# Patient Record
Sex: Male | Born: 1935 | Race: White | Hispanic: No | Marital: Married | Smoking: Former smoker
Health system: Southern US, Community
[De-identification: ages and names within clinical notes are randomized; demographics above are authoritative.]

## PROBLEM LIST (undated history)

## (undated) DIAGNOSIS — K5790 Diverticulosis of intestine, part unspecified, without perforation or abscess without bleeding: Secondary | ICD-10-CM

## (undated) DIAGNOSIS — I482 Chronic atrial fibrillation, unspecified: Secondary | ICD-10-CM

## (undated) DIAGNOSIS — I6529 Occlusion and stenosis of unspecified carotid artery: Secondary | ICD-10-CM

## (undated) DIAGNOSIS — I4821 Permanent atrial fibrillation: Secondary | ICD-10-CM

## (undated) DIAGNOSIS — D369 Benign neoplasm, unspecified site: Secondary | ICD-10-CM

## (undated) DIAGNOSIS — N200 Calculus of kidney: Secondary | ICD-10-CM

## (undated) DIAGNOSIS — I714 Abdominal aortic aneurysm, without rupture, unspecified: Secondary | ICD-10-CM

## (undated) DIAGNOSIS — E7800 Pure hypercholesterolemia, unspecified: Secondary | ICD-10-CM

## (undated) DIAGNOSIS — R7303 Prediabetes: Secondary | ICD-10-CM

## (undated) DIAGNOSIS — G4730 Sleep apnea, unspecified: Secondary | ICD-10-CM

## (undated) DIAGNOSIS — K2100 Gastro-esophageal reflux disease with esophagitis, without bleeding: Secondary | ICD-10-CM

## (undated) DIAGNOSIS — J449 Chronic obstructive pulmonary disease, unspecified: Secondary | ICD-10-CM

## (undated) DIAGNOSIS — K449 Diaphragmatic hernia without obstruction or gangrene: Secondary | ICD-10-CM

## (undated) DIAGNOSIS — K7689 Other specified diseases of liver: Secondary | ICD-10-CM

## (undated) DIAGNOSIS — I10 Essential (primary) hypertension: Secondary | ICD-10-CM

## (undated) DIAGNOSIS — K649 Unspecified hemorrhoids: Secondary | ICD-10-CM

## (undated) DIAGNOSIS — N281 Cyst of kidney, acquired: Secondary | ICD-10-CM

## (undated) DIAGNOSIS — N400 Enlarged prostate without lower urinary tract symptoms: Secondary | ICD-10-CM

## (undated) HISTORY — DX: Prediabetes: R73.03

## (undated) HISTORY — DX: Diverticulosis of intestine, part unspecified, without perforation or abscess without bleeding: K57.90

## (undated) HISTORY — DX: Pure hypercholesterolemia, unspecified: E78.00

## (undated) HISTORY — DX: Permanent atrial fibrillation: I48.21

## (undated) HISTORY — DX: Sleep apnea, unspecified: G47.30

## (undated) HISTORY — PX: KIDNEY STONE SURGERY: SHX686

## (undated) HISTORY — PX: NASAL ENDOSCOPY: SHX286

## (undated) HISTORY — DX: Calculus of kidney: N20.0

## (undated) HISTORY — DX: Unspecified hemorrhoids: K64.9

## (undated) HISTORY — PX: HERNIA REPAIR: SHX51

## (undated) HISTORY — PX: COLONOSCOPY: SHX174

## (undated) HISTORY — DX: Essential (primary) hypertension: I10

## (undated) HISTORY — DX: Benign prostatic hyperplasia without lower urinary tract symptoms: N40.0

## (undated) HISTORY — DX: Benign neoplasm, unspecified site: D36.9

## (undated) HISTORY — DX: Occlusion and stenosis of unspecified carotid artery: I65.29

## (undated) HISTORY — DX: Cyst of kidney, acquired: N28.1

## (undated) HISTORY — DX: Diaphragmatic hernia without obstruction or gangrene: K44.9

## (undated) HISTORY — PX: KNEE ARTHROSCOPY: SUR90

## (undated) HISTORY — DX: Other specified diseases of liver: K76.89

## (undated) HISTORY — DX: Chronic atrial fibrillation, unspecified: I48.20

---

## 2008-08-02 ENCOUNTER — Encounter: Payer: Self-pay | Admitting: Orthopedic Surgery

## 2008-12-08 ENCOUNTER — Ambulatory Visit (HOSPITAL_COMMUNITY): Admission: RE | Admit: 2008-12-08 | Discharge: 2008-12-08 | Payer: Self-pay | Admitting: Pulmonary Disease

## 2008-12-28 ENCOUNTER — Ambulatory Visit (HOSPITAL_COMMUNITY): Admission: RE | Admit: 2008-12-28 | Discharge: 2008-12-28 | Payer: Self-pay | Admitting: Pulmonary Disease

## 2009-03-29 ENCOUNTER — Encounter: Payer: Self-pay | Admitting: Orthopedic Surgery

## 2009-03-30 ENCOUNTER — Ambulatory Visit: Payer: Self-pay | Admitting: Orthopedic Surgery

## 2009-03-30 DIAGNOSIS — M1710 Unilateral primary osteoarthritis, unspecified knee: Secondary | ICD-10-CM

## 2010-02-07 ENCOUNTER — Ambulatory Visit (HOSPITAL_COMMUNITY)
Admission: RE | Admit: 2010-02-07 | Discharge: 2010-02-07 | Payer: Self-pay | Source: Home / Self Care | Attending: Pulmonary Disease | Admitting: Pulmonary Disease

## 2010-02-21 NOTE — Letter (Signed)
Summary: Historic Patient File  Historic Patient File   Imported By: Elvera Maria 04/01/2009 09:12:30  _____________________________________________________________________  External Attachment:    Type:   Image     Comment:   history form

## 2010-02-21 NOTE — Letter (Signed)
Summary: *Orthopedic Consult Note  [facility]  [address] [place] [address]  [place], [place] [address]   Phone: [phone]  Fax: [phone]    Re:    CLOY COZZENS DOB:    1930/11/20   Dear: [name]   Thank you for requesting that we see the above patient for consultation.  A copy of the detailed office note will be sent under separate cover, for your review.  Evaluation today is consistent with:  1)  KNEE, ARTHRITIS, DEGEN./OSTEO (ICD-715.96)   Our recommendation is for: continued observation at this time.  He will have to have a knee replacement at some point.  He is on a protocol as you know at [facility] regarding his Coumadin non-covertable atrial fibrillation.  His cardiologist is in [place].  If he does need to have surgery I would like his cardiologist to be in refill and perhaps transferred his care here.  If not then a preoperative cardiology evaluation would be most helpful.  We may even need a preop anesthesia consult to make sure they are comfortable from an anesthetic standpoint       Thank you for this opportunity to look after your patient.  Sincerely,   Terrance Mass. MD.

## 2010-02-21 NOTE — Miscellaneous (Signed)
Summary: ROS for OV 03/30/08   Clinical Lists Changes   Fatigue, headache, ears ringing, shortness of breath, wheezimg, snoring, constipation, frequency, urgency, blood in urine, joint pain, joint swelling, instability, stiffness, muscle pain, unsteady gait, easy bleeding, bruising, seasonal allergies.  History Irregular heart rhythm, Pulmonary problems, Enlarged Prostate,   Surgeries Kidney stone, 2 hernias, 2 left knee arthroscopies  Family History heart disease, arthritis, cancer.  Married, retired no smoking or alcohol 2 cups of coffee per day.  Meds: Klor Con, Amlodipine, Benazepril, Benicar, ASA, MV, Metoprolol,Coumadin, Crestor, Tamsulosin, Oxybutyn er.

## 2010-02-21 NOTE — Assessment & Plan Note (Signed)
Summary: RT KNEE PAIN NEDS XR/MEDICARE/ST BCBS/HAWKINS/BSF   Vital Signs:  Patient profile:   77 year old male Height:      68 inches Weight:      252 pounds Temp:     98.7 degrees F  Visit Type:  consult  CC:  right knee pain.  History of Present Illness: consult has been requested by [name]  I have a 78 her home and presents with RIGHT knee pain he was previously seen and due up in July and eventually had 5 shots of hyaluronic acid did well started having pain again but recently has noted a decrease in pain.  He does have pain is sharp burning comes and goes it's rated a 5/10 is worse with exercise and relieved by heat.  He does note some tingling and swelling in the knee  Diagnosis is chondromalacia patella along with arthritis    Physical Exam  Additional Exam:  GEN: well developed, well nourished, normal grooming and hygiene, no deformity and medium large body habitus.   CDV: pulses are normal, no edema, no erythema. no tenderness, however he had significant skin changes which suggest venous stasis disease with darkening of the skin near the ankles and shin bone.  Lymph: normal lymph nodes   Skin: no rashes, skin lesions or open sores   NEURO: normal coordination, reflexes, sensation.   Psyche: awake, alert and oriented. Mood normal   Gait: fairly normal  RIGHT knee flexion contracture 5-10, flexion 130  Medial joint line tenderness is noted.  Crepitance is noted on range of motion.  Motor exam is normal.  Joint is stable.       Impression & Recommendations:  Problem # 1:  KNEE, ARTHRITIS, DEGEN./OSTEO (ICD-715.96) Assessment New the x-rays were ordered and they show that he has a moderate amount of arthritis on x-ray  There is medial joint space narrowing, osteophytes, bone looks of good quality  Impression moderate osteoarthritis RIGHT knee   Assessment this patient is on Coumadin and is on a Coumadin protocol at [facility].  My most pressing concern  is that he could not be cardioverted and if he had any surgery I would like for him to transfer his cardiologist from [place] to [place].  However at present he is not having a lot of pain is not ready for knee surgery so we can wait until he becomes more symptomatic.  If he decides to have surgery her preoperative evaluation would be helpful and a transfer of care from [place] to [place] would be very helpful from a cardiology standpoint  Orders: Consultation Level III [phone])

## 2010-02-21 NOTE — Letter (Signed)
Summary: External Other  External Other   Imported By: Elvera Maria 04/01/2009 09:13:07  _____________________________________________________________________  External Attachment:    Type:   Image     Comment:   [facility]

## 2011-10-04 ENCOUNTER — Emergency Department (HOSPITAL_COMMUNITY): Payer: Medicare Other

## 2011-10-04 ENCOUNTER — Emergency Department (HOSPITAL_COMMUNITY)
Admission: EM | Admit: 2011-10-04 | Discharge: 2011-10-05 | Disposition: A | Discharge status: Home / Self Care | Payer: Medicare Other | Attending: Emergency Medicine | Admitting: Emergency Medicine

## 2011-10-04 ENCOUNTER — Encounter (HOSPITAL_COMMUNITY): Payer: Self-pay

## 2011-10-04 DIAGNOSIS — Z87891 Personal history of nicotine dependence: Secondary | ICD-10-CM | POA: Insufficient documentation

## 2011-10-04 DIAGNOSIS — J4489 Other specified chronic obstructive pulmonary disease: Secondary | ICD-10-CM | POA: Insufficient documentation

## 2011-10-04 DIAGNOSIS — J449 Chronic obstructive pulmonary disease, unspecified: Secondary | ICD-10-CM | POA: Insufficient documentation

## 2011-10-04 DIAGNOSIS — I10 Essential (primary) hypertension: Secondary | ICD-10-CM | POA: Insufficient documentation

## 2011-10-04 DIAGNOSIS — M549 Dorsalgia, unspecified: Secondary | ICD-10-CM | POA: Insufficient documentation

## 2011-10-04 DIAGNOSIS — R109 Unspecified abdominal pain: Secondary | ICD-10-CM | POA: Insufficient documentation

## 2011-10-04 DIAGNOSIS — G8929 Other chronic pain: Secondary | ICD-10-CM | POA: Insufficient documentation

## 2011-10-04 HISTORY — DX: Abdominal aortic aneurysm, without rupture: I71.4

## 2011-10-04 HISTORY — DX: Chronic obstructive pulmonary disease, unspecified: J44.9

## 2011-10-04 HISTORY — DX: Abdominal aortic aneurysm, without rupture, unspecified: I71.40

## 2011-10-04 LAB — COMPREHENSIVE METABOLIC PANEL
Albumin: 4.1 g/dL (ref 3.5–5.2)
Alkaline Phosphatase: 59 U/L (ref 39–117)
BUN: 22 mg/dL (ref 6–23)
Calcium: 9.9 mg/dL (ref 8.4–10.5)
Potassium: 3.5 mEq/L (ref 3.5–5.1)
Total Protein: 7.7 g/dL (ref 6.0–8.3)

## 2011-10-04 LAB — URINALYSIS, ROUTINE W REFLEX MICROSCOPIC
Glucose, UA: NEGATIVE mg/dL
Ketones, ur: NEGATIVE mg/dL
Leukocytes, UA: NEGATIVE
Nitrite: NEGATIVE
Protein, ur: NEGATIVE mg/dL

## 2011-10-04 LAB — CBC WITH DIFFERENTIAL/PLATELET
Basophils Absolute: 0 10*3/uL (ref 0.0–0.1)
Basophils Relative: 0 % (ref 0–1)
MCHC: 33.8 g/dL (ref 30.0–36.0)
Neutro Abs: 5.8 10*3/uL (ref 1.7–7.7)
Neutrophils Relative %: 67 % (ref 43–77)
RDW: 13.3 % (ref 11.5–15.5)

## 2011-10-04 LAB — LACTIC ACID, PLASMA: Lactic Acid, Venous: 1.1 mmol/L (ref 0.5–2.2)

## 2011-10-04 LAB — APTT: aPTT: 37 seconds (ref 24–37)

## 2011-10-04 LAB — HEPATIC FUNCTION PANEL
AST: 25 U/L (ref 0–37)
Alkaline Phosphatase: 59 U/L (ref 39–117)
Bilirubin, Direct: 0.1 mg/dL (ref 0.0–0.3)
Total Bilirubin: 0.9 mg/dL (ref 0.3–1.2)

## 2011-10-04 LAB — PROTIME-INR: Prothrombin Time: 22.1 seconds — ABNORMAL HIGH (ref 11.6–15.2)

## 2011-10-04 LAB — URINE MICROSCOPIC-ADD ON

## 2011-10-04 MED ORDER — HYDROCODONE-ACETAMINOPHEN 5-325 MG PO TABS: 2.0000 | ORAL_TABLET | Freq: Four times a day (QID) | ORAL | Status: DC | PRN

## 2011-10-04 MED ORDER — HYDROMORPHONE HCL PF 1 MG/ML IJ SOLN
1.0000 mg | Freq: Once | INTRAMUSCULAR | Status: AC
  Administered 2011-10-04: 1 mg via INTRAVENOUS
  Filled 2011-10-04: qty 1

## 2011-10-04 MED ORDER — IOHEXOL 300 MG/ML  SOLN
100.0000 mL | Freq: Once | INTRAMUSCULAR | Status: AC | PRN
  Administered 2011-10-04: 100 mL via INTRAVENOUS

## 2011-10-04 MED ORDER — PANTOPRAZOLE SODIUM 20 MG PO TBEC: 20.0000 mg | DELAYED_RELEASE_TABLET | Freq: Every day | ORAL | Status: DC

## 2011-10-04 MED ORDER — HYDROCODONE-ACETAMINOPHEN 5-325 MG PO TABS
2.0000 | ORAL_TABLET | Freq: Once | ORAL | Status: AC
  Administered 2011-10-05: 2 via ORAL
  Filled 2011-10-04: qty 2

## 2011-10-04 MED ORDER — ALUM & MAG HYDROXIDE-SIMETH 200-200-20 MG/5ML PO SUSP
30.0000 mL | Freq: Once | ORAL | Status: AC
  Administered 2011-10-05: 30 mL via ORAL
  Filled 2011-10-04: qty 30

## 2011-10-04 NOTE — ED Notes (Addendum)
Pt states increased pain 10/10. Dr Rhunette Croft notified and reordered 1mg  dilauded. Will administer and reasess in 30 min

## 2011-10-04 NOTE — ED Provider Notes (Signed)
History     CSN: [id number]  Arrival date & time 10/04/11  1952   First MD Initiated Contact with Patient 10/04/11 2015      Chief Complaint  Patient presents with  . Abdominal Pain  . Back Pain    (Consider location/radiation/quality/duration/timing/severity/associated sxs/prior treatment) HPI Comments: Pt with hx of HTN, AAA comes in with cc of abd pain and back pain. Pt reports that he started having some abd pain this morning - and it is described as diffuse abd pain, difficult to characterize, with may be some radiation to the back. There is no associated nausea, emesis, diarrhea. No chest pain, sob. Pt has no specific aggravating or relieving factors, and there is hx of similar pain in the past. Pt has some loss of appetite. No weakness to the legs, no numbness, tingling.   Patient is a 78 y.o. male presenting with abdominal pain and back pain. The history is provided by the patient.  Abdominal Pain The primary symptoms of the illness include abdominal pain. The primary symptoms of the illness do not include fever, shortness of breath, nausea, vomiting, diarrhea or dysuria.  Additional symptoms associated with the illness include back pain. Symptoms associated with the illness do not include chills or constipation.  Back Pain  Associated symptoms include abdominal pain. Pertinent negatives include no chest pain, no fever, no headaches and no dysuria.    Past Medical History  Diagnosis Date  . Atrial fibrillation, controlled   . COPD (chronic obstructive pulmonary disease)   . Hernia   . Abdominal aortic aneurysm without rupture   . Kidney stones     Past Surgical History  Procedure Date  . Hernia repair   . Lithotripsy     History reviewed. No pertinent family history.  History  Substance Use Topics  . Smoking status: Former Games developer  . Smokeless tobacco: Not on file  . Alcohol Use: No      Review of Systems  Constitutional: Negative for fever, chills and  activity change.  HENT: Negative for neck pain.   Eyes: Negative for visual disturbance.  Respiratory: Negative for cough, chest tightness and shortness of breath.   Cardiovascular: Negative for chest pain.  Gastrointestinal: Positive for abdominal pain. Negative for nausea, vomiting, diarrhea, constipation, blood in stool, abdominal distention and rectal pain.  Genitourinary: Negative for dysuria, enuresis and difficulty urinating.  Musculoskeletal: Positive for back pain. Negative for arthralgias.  Neurological: Positive for dizziness. Negative for light-headedness and headaches.  Psychiatric/Behavioral: Negative for confusion.    Allergies  Latex  Home Medications  No current outpatient prescriptions on file.  BP 188/93  Pulse 81  Temp 98.4 F (36.9 C) (Oral)  Resp 24  Ht 5\' 8"  (1.727 m)  Wt 250 lb (113.399 kg)  BMI 38.01 kg/m2  SpO2 100%  Physical Exam  Nursing note and vitals reviewed. Constitutional: He is oriented to person, place, and time. He appears well-developed.  HENT:  Head: Normocephalic and atraumatic.  Eyes: Conjunctivae normal and EOM are normal. Pupils are equal, round, and reactive to light.  Neck: Normal range of motion. Neck supple.  Cardiovascular: Normal rate and regular rhythm.   Pulmonary/Chest: Effort normal and breath sounds normal.  Abdominal: Soft. Bowel sounds are normal. He exhibits distension. There is tenderness. There is no rebound and no guarding.       abd is distended, with what appears tobe a reducible ventral hernia. Diffuse tenderness, no CVA tenderness, no rebound or guarding.  Neurological: He is  alert and oriented to person, place, and time.  Skin: Skin is warm.    ED Course  Procedures (including critical care time)  Labs Reviewed  CBC WITH DIFFERENTIAL - Abnormal; Notable for the following:    Monocytes Relative 13 (*)     Monocytes Absolute 1.1 (*)     All other components within normal limits  TROPONIN I  APTT    LIPASE, BLOOD  HEPATIC FUNCTION PANEL  URINALYSIS, ROUTINE W REFLEX MICROSCOPIC  COMPREHENSIVE METABOLIC PANEL   Dg Abd Acute W/chest  10/04/2011  *RADIOLOGY REPORT*  Clinical Data: Abdominal pain and back pain.  ACUTE ABDOMEN SERIES (ABDOMEN 2 VIEW & CHEST 1 VIEW)  Comparison: CT abdomen and pelvis 12/28/2008.  Findings: Shallow inspiration.  Mild cardiac enlargement with normal pulmonary vascularity.  No focal airspace consolidation in the lungs.  No blunting of costophrenic angles.  Slight fibrosis in the left base.  Calcification of the aorta.  No pneumothorax.  Small large bowel are diffusely gas-filled with mild dilatation. Changes most consistent with adynamic ileus.  No free intra- abdominal air.  No abnormal air fluid levels.  Calcifications in the pelvis consistent with phleboliths.  Degenerative changes in the spine and hips.  Postsurgical changes in the left inguinal region.  IMPRESSION: No evidence of active pulmonary disease.  Mild diffuse gaseous prominence of small and large bowel most suggestive of ileus.   Original Report Authenticated By: Marlon Pel, M.D.      No diagnosis found.    MDM   Date: 10/04/2011  Rate: 89  Rhythm: atrial fibrillation  QRS Axis: normal  Intervals: normal  ST/T Wave abnormalities: nonspecific ST/T changes  Conduction Disutrbances:none  Narrative Interpretation:   Old EKG Reviewed: none available  DDx includes: Pancreatitis Hepatobiliary pathology including cholecystitis Gastritis/PUD SBO ACS syndrome Aortic Dissection Colitis AAA Tumors Colitis Intra abdominal abscess Thrombosis Mesenteric ischemia Diverticulitis Peritonitis Appendicitis Hernia Nephrolithiasis Pyelonephritis UTI/Cystitis   Pt comes in with cc of abd pain - and with hx of afib, and aaa, HTN, smoking - the ddx is vast as above.  We will get GI screening labs, start with AAS. Bedside US didn't get a good picture of the aorta - FAST is  negative.  Plan is to get AAS - and based on the findings decided to get CT with po contrast vs. IV only. Will get ACS labs as well. Lactate ordered to see if there is any evidence of ischemia as he has afib CT will also look for any hematoma.  11:51 PM All the labs are WNL. CT scan shows some liver cysts with possible bleeding within them. Hb is fine, no elevated WC, INR is normal, lactate is fine. Pt's pain is much better - family is at bedside.  We gave the findings to them, and have requested a f/u with PCP or GI soon. We will give GI contact info. Advised to return to the ED immediately if there are any concerns.                  Derwood Kaplan, MD 10/04/11 [phone]

## 2011-10-04 NOTE — ED Notes (Signed)
Abdominal pain and back pain that started this morning per pt. Denies nausea, vomiting, shortness of breath.

## 2011-10-05 NOTE — ED Notes (Signed)
Waiting for 2nd troponin EDP stated if negative pt to be discharged

## 2011-10-08 ENCOUNTER — Ambulatory Visit (INDEPENDENT_AMBULATORY_CARE_PROVIDER_SITE_OTHER): Payer: Medicare Other | Admitting: Gastroenterology

## 2011-10-08 ENCOUNTER — Encounter: Payer: Self-pay | Admitting: Gastroenterology

## 2011-10-08 VITALS — BP 114/64 | HR 70 | Temp 98.2°F | Ht 68.0 in | Wt 245.4 lb

## 2011-10-08 DIAGNOSIS — K219 Gastro-esophageal reflux disease without esophagitis: Secondary | ICD-10-CM

## 2011-10-08 DIAGNOSIS — R1010 Upper abdominal pain, unspecified: Secondary | ICD-10-CM

## 2011-10-08 DIAGNOSIS — R498 Other voice and resonance disorders: Secondary | ICD-10-CM

## 2011-10-08 DIAGNOSIS — K5900 Constipation, unspecified: Secondary | ICD-10-CM

## 2011-10-08 DIAGNOSIS — R142 Eructation: Secondary | ICD-10-CM | POA: Insufficient documentation

## 2011-10-08 DIAGNOSIS — Z79899 Other long term (current) drug therapy: Secondary | ICD-10-CM

## 2011-10-08 DIAGNOSIS — K7689 Other specified diseases of liver: Secondary | ICD-10-CM

## 2011-10-08 DIAGNOSIS — R109 Unspecified abdominal pain: Secondary | ICD-10-CM

## 2011-10-08 DIAGNOSIS — R499 Unspecified voice and resonance disorder: Secondary | ICD-10-CM | POA: Insufficient documentation

## 2011-10-08 DIAGNOSIS — R141 Gas pain: Secondary | ICD-10-CM

## 2011-10-08 MED ORDER — POLYETHYLENE GLYCOL 3350 17 GM/SCOOP PO POWD: 17.0000 g | Freq: Every day | ORAL | Status: AC | PRN

## 2011-10-08 NOTE — Progress Notes (Signed)
Primary Care Physician:  Fredirick Maudlin, MD  Primary Gastroenterologist:  Roetta Sessions, MD   Chief Complaint  Patient presents with  . Abdominal Pain    HPI:  Tristan Lopez is a 78 y.o. male here accompanied by his wife and daughter-in-law for further evaluation of recent abdominal pain and need for endoscopy and colonoscopy.  Thursday night mid abdominal pain, swelling. Progressed over 3-4 hour period time and became severe.Thursday morning lot of burping. Ate fish at 2pm. BM green, loose. Took Kaopectate. Had also taken milk of magnesia 2 doses earlier in the day. Severe pain and into back. RUQ tenderness with palpation. Pain went away after Dilaudid in the emergency room. He had blood work and CT, abdominal series as outlined below. No further abdominal pain.  Friday, he fell when trying to get off the toilet. States his legs have gone to sleep for sitting there too long. [name]. Some residual soreness of right lower ribs, minimal bruising.   Chronically he denies heartburn but has lots of belching. Worse over the last several months. No dysphagia. No n/v. Throat clearing/hoarseness for couple of years. BM, chronic constipation. Better with eating "protein bar". No blood in the stool. No black stool. [name] in [place], manages coumadin, for A. Fib. No prior history of stroke or blood clots.  History of large hepatic cysts, no prior difficulties or evaluation for this.  Current Outpatient Prescriptions  Medication Sig Dispense Refill  . amLODipine (NORVASC) 5 MG tablet Take 5 mg by mouth daily.      Marland Kitchen aspirin EC 81 MG tablet Take 81 mg by mouth daily.      . benazepril (LOTENSIN) 40 MG tablet Take 40 mg by mouth daily.      Marland Kitchen docusate sodium (COLACE) 100 MG capsule Take 100 mg by mouth daily.      . finasteride (PROSCAR) 5 MG tablet Take 5 mg by mouth daily.      . Garlic 1000 MG CAPS Take 1,000 mg by mouth daily.      Marland Kitchen  losartan-hydrochlorothiazide (HYZAAR) 100-25 MG per tablet Take 1 tablet by mouth daily.      . metoprolol (LOPRESSOR) 100 MG tablet Take 50 mg by mouth daily.      . Multiple Vitamin (MULTIVITAMIN) tablet Take 1 tablet by mouth daily.      Marland Kitchen oxybutynin (DITROPAN-XL) 10 MG 24 hr tablet Take 10 mg by mouth daily.      . potassium chloride SA (K-DUR,KLOR-CON) 20 MEQ tablet Take 20 mEq by mouth daily.      . rosuvastatin (CRESTOR) 10 MG tablet Take 10 mg by mouth daily.      . Tamsulosin HCl (FLOMAX) 0.4 MG CAPS Take 0.4 mg by mouth daily.      Marland Kitchen warfarin (COUMADIN) 5 MG tablet Take 5-7.5 mg by mouth daily. Patient takes 7.5mg  on(Tue.,Thur.,Sat.,Sun) and Patient takes 5mg  on(Mon.,Wed.,Fri.,)        Allergies as of 10/08/2011 - Review Complete 10/08/2011  Allergen Reaction Noted  . Latex Rash 10/04/2011    Past Medical History  Diagnosis Date  . Atrial fibrillation, controlled     chronic coumadin  . COPD (chronic obstructive pulmonary disease)   . Hernia   . Abdominal aortic aneurysm without rupture     followed by [name]  . Kidney stones   . Liver cyst   . Kidney cysts   . Sleep apnea  Past Surgical History  Procedure Date  . Hernia repair     x2, bilateral inguinal   . Kidney stone surgery   . Knee arthroscopy     X2  . Colonoscopy     2003, no polyps per patient. [name]    Family History  Problem Relation Age of Onset  . Colon cancer Neg Hx   . Liver disease Neg Hx   . Breast cancer Mother   . Heart disease Father     pacemaker    History   Social History  . Marital Status: Married    Spouse Name: N/A    Number of Children: 3  . Years of Education: N/A   Occupational History  .     Social History Main Topics  . Smoking status: Former Smoker -- 0.5 packs/day    Types: Cigarettes  . Smokeless tobacco: Not on file   Comment: quit about 10 years  . Alcohol Use: No  . Drug Use: No  . Sexually Active: Not on file   Other Topics Concern  .  Not on file   Social History Narrative  . No narrative on file      ROS:  General: Negative for anorexia, weight loss, fever, chills, fatigue, weakness. Eyes: Negative for vision changes.  ENT: see history of present illness. Negative for difficulty swallowing , nasal congestion. CV: Negative for chest pain, angina, palpitations. chronic dyspnea on exertion. no peripheral edema.  Respiratory: Negative for dyspnea at rest, cough, sputum, wheezing. chronic dyspnea on exertion GI: See history of present illness. GU:  Negative for dysuria, hematuria, urinary incontinence, urinary frequency, nocturnal urination.  MS: Negative for joint pain, low back pain.  Derm: Negative for rash or itching.  Neuro: Negative for weakness, abnormal sensation, seizure, frequent headaches, memory loss, confusion.  Psych: Negative for anxiety, depression, suicidal ideation, hallucinations.  Endo: Negative for unusual weight change.  Heme: Negative for bruising or bleeding. Allergy: Negative for rash or hives.    Physical Examination:  BP 114/64  Pulse 70  Temp 98.2 F (36.8 C) (Temporal)  Ht 5\' 8"  (1.727 m)  Wt 245 lb 6.4 oz (111.313 kg)  BMI 37.31 kg/m2   General: Well-nourished, well-developed in no acute distress. Accompanied by wife and daughter-in-law (x-ray tech) Head: Normocephalic, atraumatic.  Small bruise on left frontal head region. Eyes: Conjunctiva pink, no icterus. Mouth: Oropharyngeal mucosa moist and pink , no lesions erythema or exudate. Neck: Supple without thyromegaly, masses, or lymphadenopathy.  Lungs: Clear to auscultation bilaterally.  Heart: Regular rate and rhythm, no murmurs rubs or gallops.  Abdomen: Bowel sounds are normal, nontender, nondistended, no splenomegaly or masses, no abdominal bruits or    hernia , no rebound or guarding.  Liver and easily palpable. Tender along the right lower rib cage margin with. Minimal bruising. Rectal: deferred Extremities: No lower  extremity edema. No clubbing or deformities.  Neuro: Alert and oriented x 4 , grossly normal neurologically.  Skin: Warm and dry, no rash or jaundice.   Psych: Alert and cooperative, normal mood and affect.  Labs: Lab Results  Component Value Date   TROPONINI <0.30 10/04/2011   Lab Results  Component Value Date   INR 1.90* 10/04/2011   Lab Results  Component Value Date   ALT 19 10/04/2011   AST 25 10/04/2011   ALKPHOS 59 10/04/2011   BILITOT 0.9 10/04/2011   Lab Results  Component Value Date   CREATININE 0.92 10/04/2011   BUN 22 10/04/2011  NA 134* 10/04/2011   K 3.5 10/04/2011   CL 96 10/04/2011   CO2 28 10/04/2011   Lab Results  Component Value Date   LIPASE 22 10/04/2011     Imaging Studies: Ct Abdomen Pelvis W Contrast  10/04/2011  *RADIOLOGY REPORT*  Clinical Data: Abdominal pain.  Distension.  CT ABDOMEN AND PELVIS WITH CONTRAST  Technique:  Multidetector CT imaging of the abdomen and pelvis was performed following the standard protocol during bolus administration of intravenous contrast.  Contrast: OMNIPAQUE IOHEXOL 300 MG/ML  SOLN  Comparison: 12/1977  Findings: Mild dependent atelectasis in the lung bases.  Multi septated cystic mass lesions with septal calcification in the liver.  The largest is in the junction of the medial segment left lobe and anterior segment right lobe and measures about 9.2 x 12.1 cm diameter.  There is internal density are debris focally which could represent hemorrhage.  A second multiloculated cystic lesion with calcification is demonstrated in the lateral segment of the left lobe of the liver and measures about 6.1 x 5.1 cm.  The lesions are not significantly changed in size since previous study. Smaller additional cystic lesions are also demonstrated as seen previously.  No definite contrast enhancement.  Lesions are likely to represent a complex cysts or biliary cyst adenomas.  Chronic infection such as hydatid cyst is less likely.  Spleen size  is normal.  The gallbladder, pancreas, and adrenal glands are unremarkable.  Diffuse calcification of the abdominal aorta without aneurysm.  Limited contrast bolus but no evidence of dissection.  Multiple parenchymal cysts in both kidneys appearing stable since previous study.  Largest in the left kidney measures about 3.5 cm diameter.  No hydronephrosis.  Calcification in the central left kidney adjacent to the cyst may represent a stone or mural calcification.  This appears stable.  No retroperitoneal lymphadenopathy. Prominent visceral adipose tissues.  The stomach, small bowel, and colon are not abnormally distended.  No evidence of obstruction or significant wall thickening.  No free air or free fluid in the abdomen.  Abdominal wall musculature is atrophic but intact.  Pelvis:  Prostatic enlargement and calcification.  Bladder wall is not thickened.  Diverticula in the sigmoid colon without diverticulitis.  The appendix is normal.  No free or loculated pelvic fluid collections.  Degenerative changes throughout the lumbar spine.  Old healed fracture of the left transverse process of L2 and L3.  IMPRESSION: Complex septated and calcified cyst in the liver are unchanged in size since the previous study but there is internal debris noted in the larger cyst which could represent hemorrhage.  No abnormal enhancement.  Multiple renal cysts are stable.  No evidence of bowel obstruction or inflammatory process.  No abdominal aortic aneurysm.   Original Report Authenticated By: Marlon Pel, M.D.    Dg Abd Acute W/chest  10/04/2011  *RADIOLOGY REPORT*  Clinical Data: Abdominal pain and back pain.  ACUTE ABDOMEN SERIES (ABDOMEN 2 VIEW & CHEST 1 VIEW)  Comparison: CT abdomen and pelvis 12/28/2008.  Findings: Shallow inspiration.  Mild cardiac enlargement with normal pulmonary vascularity.  No focal airspace consolidation in the lungs.  No blunting of costophrenic angles.  Slight fibrosis in the left base.   Calcification of the aorta.  No pneumothorax.  Small large bowel are diffusely gas-filled with mild dilatation. Changes most consistent with adynamic ileus.  No free intra- abdominal air.  No abnormal air fluid levels.  Calcifications in the pelvis consistent with phleboliths.  Degenerative changes in  the spine and hips.  Postsurgical changes in the left inguinal region.  IMPRESSION: No evidence of active pulmonary disease.  Mild diffuse gaseous prominence of small and large bowel most suggestive of ileus.   Original Report Authenticated By: Marlon Pel, M.D.

## 2011-10-08 NOTE — Patient Instructions (Addendum)
We have scheduled you for an upper endoscopy and colonoscopy with Dr. Jena Gauss. Please hold your Coumadin for 4 days prior to procedure. See separate instructions. I will verify that this is okay with your cardiologist, [name].  I will review your films with Dr. Jena Gauss and the radiologist. Further recommendations to follow.  Please start MiraLax 17 g daily as needed for constipation. Prescription sent to your pharmacy.

## 2011-10-08 NOTE — Assessment & Plan Note (Signed)
Acute onset upper abdominal pain as outlined above.  Resolved during ED visit. Ileus on AAS. Large hepatic cysts stable in size (since 2010) but ?interal hemorrhage. Will discuss with Dr. Jena Gauss and radiologist.

## 2011-10-08 NOTE — Progress Notes (Signed)
Faxed request to Dr. Cindi Carbon, cardiologist, and requested holding coumadin x 4 days to do TCS/EGD. His phone number is [phone].  Fax number is [phone].

## 2011-10-08 NOTE — Assessment & Plan Note (Signed)
Chronic constipation. No alarm symptoms. Add Miralax 17g daily prn. He has not had TCS in over 10 years. Recommend colonoscopy in near future for screening purposes. Again will plan to hold coumadin four days prior to procedure but will get approval from [name].  I have discussed the risks, alternatives, benefits with regards to but not limited to the risk of reaction to medication, bleeding, infection, perforation and the patient is agreeable to proceed. Written consent to be obtained.

## 2011-10-08 NOTE — Assessment & Plan Note (Signed)
Increased belching and throat clearing/hoarseness. ?GERD related. No PPI. Patient requesting EGD, reasonable for new onset GERD sx. Plan EGD in near future with DR. Rourk. We will plan to hold coumadin four days prior and will get approval from [name].  I have discussed the risks, alternatives, benefits with regards to but not limited to the risk of reaction to medication, bleeding, infection, perforation and the patient is agreeable to proceed. Written consent to be obtained.

## 2011-10-09 NOTE — Progress Notes (Signed)
Faxed to PCP

## 2011-10-09 NOTE — Progress Notes (Signed)
Please let pt know. Reviewed CT findings with Dr. Jena Gauss. Liver cysts are fairly large and could be causing some abdominal pain. May be amenable to drainage.   Dr. Jena Gauss, agrees with pursing TCS/EGD. The day of endoscopy, he plans to call interventional radiology to see if they can drain the larger cyst.   Let me know if he has any questions.

## 2011-10-10 ENCOUNTER — Other Ambulatory Visit: Payer: Self-pay | Admitting: Internal Medicine

## 2011-10-10 ENCOUNTER — Telehealth: Payer: Self-pay

## 2011-10-10 DIAGNOSIS — K5900 Constipation, unspecified: Secondary | ICD-10-CM

## 2011-10-10 DIAGNOSIS — K7689 Other specified diseases of liver: Secondary | ICD-10-CM

## 2011-10-10 DIAGNOSIS — K219 Gastro-esophageal reflux disease without esophagitis: Secondary | ICD-10-CM

## 2011-10-10 DIAGNOSIS — R1010 Upper abdominal pain, unspecified: Secondary | ICD-10-CM

## 2011-10-10 MED ORDER — PEG 3350-KCL-NA BICARB-NACL 420 G PO SOLR: 4000.0000 mL | ORAL | Status: DC

## 2011-10-10 NOTE — Telephone Encounter (Signed)
Patient is scheduled for TCS/EGD withRMR on 11/01/11 his instructions have been mailed and prep was electronically sent to pharmacy

## 2011-10-10 NOTE — Telephone Encounter (Signed)
Received the fax back from [name], ok to hold the coumadin x 4 days to do the TCS and EGD .

## 2011-10-12 NOTE — Progress Notes (Signed)
Received ok to hold coumadin from Dr. Albertina Senegal office. Patient is scheduled for 11/01/11.

## 2011-10-15 NOTE — Progress Notes (Signed)
Tried to call pt- LMOM 

## 2011-10-15 NOTE — Progress Notes (Signed)
Pt called back and I told him everything. He is aware of his TCS/EGD on October 10 at 9:30

## 2011-10-17 ENCOUNTER — Telehealth: Payer: Self-pay | Admitting: Internal Medicine

## 2011-10-17 MED ORDER — HYDROCODONE-ACETAMINOPHEN 5-500 MG PO TABS: 1.0000 | ORAL_TABLET | Freq: Four times a day (QID) | ORAL | Status: DC | PRN

## 2011-10-17 NOTE — Telephone Encounter (Signed)
Pt's wife is aware of new Rx and I faxed it to the drug store

## 2011-10-17 NOTE — Telephone Encounter (Signed)
Pt's wife called asking to speak with LSL. Pt is having pain in his side and has cyst on liver. Pt is scheduled for a procedure on 10/10 per wife. Please call her back at [phone]

## 2011-10-17 NOTE — Telephone Encounter (Signed)
Pt's wife called back and would like to know if he can have some pain medication until he has his procedure. He got some from the ER but now he is out of pain medication.Hydroc/acetaminop 5/325. He uses the [facility] in [place]. Please advise     Sending to KJ since LSL is out of office

## 2011-10-17 NOTE — Telephone Encounter (Signed)
#  30 vicodin rx ready Please let pt know to go to ER if severe pain Thanks

## 2011-10-29 ENCOUNTER — Encounter (HOSPITAL_COMMUNITY): Payer: Self-pay | Admitting: Pharmacy Technician

## 2011-10-31 MED ORDER — SODIUM CHLORIDE 0.45 % IV SOLN
INTRAVENOUS | Status: DC
  Administered 2011-11-01: 1000 mL via INTRAVENOUS

## 2011-11-01 ENCOUNTER — Encounter (HOSPITAL_COMMUNITY)
Admission: RE | Disposition: A | Discharge status: Home / Self Care | Payer: Self-pay | Source: Ambulatory Visit | Attending: Internal Medicine

## 2011-11-01 ENCOUNTER — Encounter (HOSPITAL_COMMUNITY): Payer: Self-pay | Admitting: *Deleted

## 2011-11-01 ENCOUNTER — Other Ambulatory Visit: Payer: Self-pay | Admitting: Physician Assistant

## 2011-11-01 ENCOUNTER — Ambulatory Visit (HOSPITAL_COMMUNITY)
Admission: RE | Admit: 2011-11-01 | Discharge: 2011-11-01 | Disposition: A | Discharge status: Home / Self Care | Payer: Medicare Other | Source: Ambulatory Visit | Attending: Internal Medicine | Admitting: Internal Medicine

## 2011-11-01 DIAGNOSIS — K2960 Other gastritis without bleeding: Secondary | ICD-10-CM | POA: Insufficient documentation

## 2011-11-01 DIAGNOSIS — K7689 Other specified diseases of liver: Secondary | ICD-10-CM

## 2011-11-01 DIAGNOSIS — Z1211 Encounter for screening for malignant neoplasm of colon: Secondary | ICD-10-CM | POA: Insufficient documentation

## 2011-11-01 DIAGNOSIS — R1010 Upper abdominal pain, unspecified: Secondary | ICD-10-CM

## 2011-11-01 DIAGNOSIS — K2100 Gastro-esophageal reflux disease with esophagitis, without bleeding: Secondary | ICD-10-CM | POA: Insufficient documentation

## 2011-11-01 DIAGNOSIS — J4489 Other specified chronic obstructive pulmonary disease: Secondary | ICD-10-CM | POA: Insufficient documentation

## 2011-11-01 DIAGNOSIS — D126 Benign neoplasm of colon, unspecified: Secondary | ICD-10-CM

## 2011-11-01 DIAGNOSIS — R1013 Epigastric pain: Secondary | ICD-10-CM

## 2011-11-01 DIAGNOSIS — K5900 Constipation, unspecified: Secondary | ICD-10-CM

## 2011-11-01 DIAGNOSIS — K219 Gastro-esophageal reflux disease without esophagitis: Secondary | ICD-10-CM

## 2011-11-01 DIAGNOSIS — R1011 Right upper quadrant pain: Secondary | ICD-10-CM | POA: Insufficient documentation

## 2011-11-01 DIAGNOSIS — J449 Chronic obstructive pulmonary disease, unspecified: Secondary | ICD-10-CM | POA: Insufficient documentation

## 2011-11-01 DIAGNOSIS — K5730 Diverticulosis of large intestine without perforation or abscess without bleeding: Secondary | ICD-10-CM

## 2011-11-01 HISTORY — PX: ESOPHAGOGASTRODUODENOSCOPY: SHX1529

## 2011-11-01 HISTORY — PX: COLONOSCOPY: SHX174

## 2011-11-01 SURGERY — COLONOSCOPY WITH ESOPHAGOGASTRODUODENOSCOPY (EGD)
Anesthesia: Moderate Sedation

## 2011-11-01 MED ORDER — MIDAZOLAM HCL 5 MG/5ML IJ SOLN
INTRAMUSCULAR | Status: AC
  Filled 2011-11-01: qty 10

## 2011-11-01 MED ORDER — STERILE WATER FOR IRRIGATION IR SOLN
Status: DC | PRN
  Administered 2011-11-01: 10:00:00

## 2011-11-01 MED ORDER — BUTAMBEN-TETRACAINE-BENZOCAINE 2-2-14 % EX AERO
INHALATION_SPRAY | CUTANEOUS | Status: DC | PRN
  Administered 2011-11-01: 1 via TOPICAL

## 2011-11-01 MED ORDER — MIDAZOLAM HCL 5 MG/5ML IJ SOLN
INTRAMUSCULAR | Status: DC | PRN
  Administered 2011-11-01: 2 mg via INTRAVENOUS
  Administered 2011-11-01 (×3): 1 mg via INTRAVENOUS

## 2011-11-01 MED ORDER — MEPERIDINE HCL 100 MG/ML IJ SOLN
INTRAMUSCULAR | Status: AC
  Filled 2011-11-01: qty 2

## 2011-11-01 MED ORDER — MEPERIDINE HCL 100 MG/ML IJ SOLN
INTRAMUSCULAR | Status: DC | PRN
  Administered 2011-11-01: 25 mg via INTRAVENOUS
  Administered 2011-11-01: 50 mg via INTRAVENOUS

## 2011-11-01 NOTE — H&P (View-Only) (Signed)
Primary Care Physician:  HAWKINS,EDWARD L, MD  Primary Gastroenterologist:  Michael Rourk, MD   Chief Complaint  Patient presents with  . Abdominal Pain    HPI:  Tristan Lopez is a 78 y.o. male here accompanied by his wife and daughter-in-law for further evaluation of recent abdominal pain and need for endoscopy and colonoscopy.  Thursday night mid abdominal pain, swelling. Progressed over 3-4 hour period time and became severe.Thursday morning lot of burping. Ate fish at 2pm. BM green, loose. Took Kaopectate. Had also taken milk of magnesia 2 doses earlier in the day. Severe pain and into back. RUQ tenderness with palpation. Pain went away after Dilaudid in the emergency room. He had blood work and CT, abdominal series as outlined below. No further abdominal pain.  Friday, he fell when trying to get off the toilet. States his legs have gone to sleep for sitting there too long. Fell on his right lower ribs and also hit his head. Some residual soreness of right lower ribs, minimal bruising.   Chronically he denies heartburn but has lots of belching. Worse over the last several months. No dysphagia. No n/v. Throat clearing/hoarseness for couple of years. BM, chronic constipation. Better with eating "protein bar". No blood in the stool. No black stool. Dr. Zachary in [place], manages coumadin, for A. Fib. No prior history of stroke or blood clots.  History of large hepatic cysts, no prior difficulties or evaluation for this.  Current Outpatient Prescriptions  Medication Sig Dispense Refill  . amLODipine (NORVASC) 5 MG tablet Take 5 mg by mouth daily.      . aspirin EC 81 MG tablet Take 81 mg by mouth daily.      . benazepril (LOTENSIN) 40 MG tablet Take 40 mg by mouth daily.      . docusate sodium (COLACE) 100 MG capsule Take 100 mg by mouth daily.      . finasteride (PROSCAR) 5 MG tablet Take 5 mg by mouth daily.      . Garlic 1000 MG CAPS Take 1,000 mg by mouth daily.      .  losartan-hydrochlorothiazide (HYZAAR) 100-25 MG per tablet Take 1 tablet by mouth daily.      . metoprolol (LOPRESSOR) 100 MG tablet Take 50 mg by mouth daily.      . Multiple Vitamin (MULTIVITAMIN) tablet Take 1 tablet by mouth daily.      . oxybutynin (DITROPAN-XL) 10 MG 24 hr tablet Take 10 mg by mouth daily.      . potassium chloride SA (K-DUR,KLOR-CON) 20 MEQ tablet Take 20 mEq by mouth daily.      . rosuvastatin (CRESTOR) 10 MG tablet Take 10 mg by mouth daily.      . Tamsulosin HCl (FLOMAX) 0.4 MG CAPS Take 0.4 mg by mouth daily.      . warfarin (COUMADIN) 5 MG tablet Take 5-7.5 mg by mouth daily. Patient takes 7.5mg on(Tue.,Thur.,Sat.,Sun) and Patient takes 5mg on(Mon.,Wed.,Fri.,)        Allergies as of 10/08/2011 - Review Complete 10/08/2011  Allergen Reaction Noted  . Latex Rash 10/04/2011    Past Medical History  Diagnosis Date  . Atrial fibrillation, controlled     chronic coumadin  . COPD (chronic obstructive pulmonary disease)   . Hernia   . Abdominal aortic aneurysm without rupture     followed by Dr. Hawkins  . Kidney stones   . Liver cyst   . Kidney cysts   . Sleep apnea       Past Surgical History  Procedure Date  . Hernia repair     x2, bilateral inguinal   . Kidney stone surgery   . Knee arthroscopy     X2  . Colonoscopy     2003, no polyps per patient. Dr. Spainhour    Family History  Problem Relation Age of Onset  . Colon cancer Neg Hx   . Liver disease Neg Hx   . Breast cancer Mother   . Heart disease Father     pacemaker    History   Social History  . Marital Status: Married    Spouse Name: N/A    Number of Children: 3  . Years of Education: N/A   Occupational History  .     Social History Main Topics  . Smoking status: Former Smoker -- 0.5 packs/day    Types: Cigarettes  . Smokeless tobacco: Not on file   Comment: quit about 10 years  . Alcohol Use: No  . Drug Use: No  . Sexually Active: Not on file   Other Topics Concern  .  Not on file   Social History Narrative  . No narrative on file      ROS:  General: Negative for anorexia, weight loss, fever, chills, fatigue, weakness. Eyes: Negative for vision changes.  ENT: see history of present illness. Negative for difficulty swallowing , nasal congestion. CV: Negative for chest pain, angina, palpitations. chronic dyspnea on exertion. no peripheral edema.  Respiratory: Negative for dyspnea at rest, cough, sputum, wheezing. chronic dyspnea on exertion GI: See history of present illness. GU:  Negative for dysuria, hematuria, urinary incontinence, urinary frequency, nocturnal urination.  MS: Negative for joint pain, low back pain.  Derm: Negative for rash or itching.  Neuro: Negative for weakness, abnormal sensation, seizure, frequent headaches, memory loss, confusion.  Psych: Negative for anxiety, depression, suicidal ideation, hallucinations.  Endo: Negative for unusual weight change.  Heme: Negative for bruising or bleeding. Allergy: Negative for rash or hives.    Physical Examination:  BP 114/64  Pulse 70  Temp 98.2 F (36.8 C) (Temporal)  Ht 5' 8" (1.727 m)  Wt 245 lb 6.4 oz (111.313 kg)  BMI 37.31 kg/m2   General: Well-nourished, well-developed in no acute distress. Accompanied by wife and daughter-in-law (x-ray tech) Head: Normocephalic, atraumatic.  Small bruise on left frontal head region. Eyes: Conjunctiva pink, no icterus. Mouth: Oropharyngeal mucosa moist and pink , no lesions erythema or exudate. Neck: Supple without thyromegaly, masses, or lymphadenopathy.  Lungs: Clear to auscultation bilaterally.  Heart: Regular rate and rhythm, no murmurs rubs or gallops.  Abdomen: Bowel sounds are normal, nontender, nondistended, no splenomegaly or masses, no abdominal bruits or    hernia , no rebound or guarding.  Liver and easily palpable. Tender along the right lower rib cage margin with. Minimal bruising. Rectal: deferred Extremities: No lower  extremity edema. No clubbing or deformities.  Neuro: Alert and oriented x 4 , grossly normal neurologically.  Skin: Warm and dry, no rash or jaundice.   Psych: Alert and cooperative, normal mood and affect.  Labs: Lab Results  Component Value Date   TROPONINI <0.30 10/04/2011   Lab Results  Component Value Date   INR 1.90* 10/04/2011   Lab Results  Component Value Date   ALT 19 10/04/2011   AST 25 10/04/2011   ALKPHOS 59 10/04/2011   BILITOT 0.9 10/04/2011   Lab Results  Component Value Date   CREATININE 0.92 10/04/2011   BUN 22 10/04/2011     NA 134* 10/04/2011   K 3.5 10/04/2011   CL 96 10/04/2011   CO2 28 10/04/2011   Lab Results  Component Value Date   LIPASE 22 10/04/2011     Imaging Studies: Ct Abdomen Pelvis W Contrast  10/04/2011  *RADIOLOGY REPORT*  Clinical Data: Abdominal pain.  Distension.  CT ABDOMEN AND PELVIS WITH CONTRAST  Technique:  Multidetector CT imaging of the abdomen and pelvis was performed following the standard protocol during bolus administration of intravenous contrast.  Contrast: 100mL OMNIPAQUE IOHEXOL 300 MG/ML  SOLN  Comparison: 12/1977  Findings: Mild dependent atelectasis in the lung bases.  Multi septated cystic mass lesions with septal calcification in the liver.  The largest is in the junction of the medial segment left lobe and anterior segment right lobe and measures about 9.2 x 12.1 cm diameter.  There is internal density are debris focally which could represent hemorrhage.  A second multiloculated cystic lesion with calcification is demonstrated in the lateral segment of the left lobe of the liver and measures about 6.1 x 5.1 cm.  The lesions are not significantly changed in size since previous study. Smaller additional cystic lesions are also demonstrated as seen previously.  No definite contrast enhancement.  Lesions are likely to represent a complex cysts or biliary cyst adenomas.  Chronic infection such as hydatid cyst is less likely.  Spleen size  is normal.  The gallbladder, pancreas, and adrenal glands are unremarkable.  Diffuse calcification of the abdominal aorta without aneurysm.  Limited contrast bolus but no evidence of dissection.  Multiple parenchymal cysts in both kidneys appearing stable since previous study.  Largest in the left kidney measures about 3.5 cm diameter.  No hydronephrosis.  Calcification in the central left kidney adjacent to the cyst may represent a stone or mural calcification.  This appears stable.  No retroperitoneal lymphadenopathy. Prominent visceral adipose tissues.  The stomach, small bowel, and colon are not abnormally distended.  No evidence of obstruction or significant wall thickening.  No free air or free fluid in the abdomen.  Abdominal wall musculature is atrophic but intact.  Pelvis:  Prostatic enlargement and calcification.  Bladder wall is not thickened.  Diverticula in the sigmoid colon without diverticulitis.  The appendix is normal.  No free or loculated pelvic fluid collections.  Degenerative changes throughout the lumbar spine.  Old healed fracture of the left transverse process of L2 and L3.  IMPRESSION: Complex septated and calcified cyst in the liver are unchanged in size since the previous study but there is internal debris noted in the larger cyst which could represent hemorrhage.  No abnormal enhancement.  Multiple renal cysts are stable.  No evidence of bowel obstruction or inflammatory process.  No abdominal aortic aneurysm.   Original Report Authenticated By: WILLIAM R. STEVENS, M.D.    Dg Abd Acute W/chest  10/04/2011  *RADIOLOGY REPORT*  Clinical Data: Abdominal pain and back pain.  ACUTE ABDOMEN SERIES (ABDOMEN 2 VIEW & CHEST 1 VIEW)  Comparison: CT abdomen and pelvis 12/28/2008.  Findings: Shallow inspiration.  Mild cardiac enlargement with normal pulmonary vascularity.  No focal airspace consolidation in the lungs.  No blunting of costophrenic angles.  Slight fibrosis in the left base.   Calcification of the aorta.  No pneumothorax.  Small large bowel are diffusely gas-filled with mild dilatation. Changes most consistent with adynamic ileus.  No free intra- abdominal air.  No abnormal air fluid levels.  Calcifications in the pelvis consistent with phleboliths.  Degenerative changes in   the spine and hips.  Postsurgical changes in the left inguinal region.  IMPRESSION: No evidence of active pulmonary disease.  Mild diffuse gaseous prominence of small and large bowel most suggestive of ileus.   Original Report Authenticated By: WILLIAM R. STEVENS, M.D.       

## 2011-11-01 NOTE — Op Note (Signed)
[facility] [address] [place], [address]   COLONOSCOPY PROCEDURE REPORT  PATIENT: Tristan Lopez, Tristan Lopez  MR#:         [id number] BIRTHDATE: [birth year] , 80  yrs. old GENDER: Male ENDOSCOPIST: R.  Roetta Sessions, MD FACP FACG REFERRED BY:  Kari Baars, M.D. PROCEDURE DATE:  11/01/2011 PROCEDURE:     ileo-colonoscopy with biopsy  INDICATIONS: average risk colorectal cancer screening  INFORMED CONSENT:  The risks, benefits, alternatives and imponderables including but not limited to bleeding, perforation as well as the possibility of a missed lesion have been reviewed.  The potential for biopsy, lesion removal, etc. have also been discussed.  Questions have been answered.  All parties agreeable. Please see the history and physical in the medical record for more information.  MEDICATIONS: Versed 5 mg IV and Demerol 75 mg IV in divided doses.  DESCRIPTION OF PROCEDURE:  After a digital rectal exam was performed, the EG-[id number] ([id number]) and [id number] ([id number]) colonoscope was advanced from the anus through the rectum and colon to the area of the cecum, ileocecal valve and appendiceal orifice. The cecum was deeply intubated.  These structures were well-seen and photographed for the record.  From the level of the cecum and ileocecal valve, the scope was slowly and cautiously withdrawn. The mucosal surfaces were carefully surveyed utilizing scope tip deflection to facilitate fold flattening as needed.  The scope was pulled down into the rectum where a thorough examination including retroflexion was performed.    FINDINGS:  suboptimal preparation. Internal hemorrhoids; otherwise, normal rectum. Pancolonic diverticulosis. There was a single diminutive polyp at the hepatic flexure; the remainder of the colonic mucosa appeared normal. The distal 10 cm of terminal ileal mucosa appeared normal.  THERAPEUTIC / DIAGNOSTIC MANEUVERS PERFORMED:  The above-mentioned polyp  was cold biopsied/removed.  COMPLICATIONS: none  CECAL WITHDRAWAL TIME:  10 minutes  IMPRESSION:  pancolonic diverticulosis. Colonic polyp-removed as described above. Internal hemorrhoids.  RECOMMENDATIONS: Follow up on pathology. See EGD report   _______________________________ eSigned:  R. Roetta Sessions, MD FACP [facility] 11/01/2011 11:01 AM   CC:

## 2011-11-01 NOTE — Interval H&P Note (Signed)
History and Physical Interval Note:  11/01/2011 10:15 AM  Tristan Lopez  has presented today for surgery, with the diagnosis of HEPATIC CYST, CONSTIPATION, UPPER ABDOMINAL PAIN,GERD  The various methods of treatment have been discussed with the patient and family. After consideration of risks, benefits and other options for treatment, the patient has consented to  Procedure(s) (LRB) with comments: COLONOSCOPY WITH ESOPHAGOGASTRODUODENOSCOPY (EGD) (N/A) - 10:45 as a surgical intervention .  The patient's history has been reviewed, patient examined, no change in status, stable for surgery.  I have reviewed the patient's chart and labs.  Questions were answered to the patient's satisfaction.     [name]

## 2011-11-01 NOTE — Discharge Instructions (Addendum)
Colonoscopy Discharge Instructions  Read the instructions outlined below and refer to this sheet in the next few weeks. These discharge instructions provide you with general information on caring for yourself after you leave the hospital. Your doctor may also give you specific instructions. While your treatment has been planned according to the most current medical practices available, unavoidable complications occasionally occur. If you have any problems or questions after discharge, call Dr. Jena Gauss at [phone]. ACTIVITY  You may resume your regular activity, but move at a slower pace for the next 24 hours.   Take frequent rest periods for the next 24 hours.   Walking will help get rid of the air and reduce the bloated feeling in your belly (abdomen).   No driving for 24 hours (because of the medicine (anesthesia) used during the test).    Do not sign any important legal documents or operate any machinery for 24 hours (because of the anesthesia used during the test).  NUTRITION  Drink plenty of fluids.   You may resume your normal diet as instructed by your doctor.   Begin with a light meal and progress to your normal diet. Heavy or fried foods are harder to digest and may make you feel sick to your stomach (nauseated).   Avoid alcoholic beverages for 24 hours or as instructed.  MEDICATIONS  You may resume your normal medications unless your doctor tells you otherwise.  WHAT YOU CAN EXPECT TODAY  Some feelings of bloating in the abdomen.   Passage of more gas than usual.   Spotting of blood in your stool or on the toilet paper.  IF YOU HAD POLYPS REMOVED DURING THE COLONOSCOPY:  No aspirin products for 7 days or as instructed.   No alcohol for 7 days or as instructed.   Eat a soft diet for the next 24 hours.  FINDING OUT THE RESULTS OF YOUR TEST Not all test results are available during your visit. If your test results are not back during the visit, make an appointment  with your caregiver to find out the results. Do not assume everything is normal if you have not heard from your caregiver or the medical facility. It is important for you to follow up on all of your test results.  SEEK IMMEDIATE MEDICAL ATTENTION IF:  You have more than a spotting of blood in your stool.   Your belly is swollen (abdominal distention).   You are nauseated or vomiting.   You have a temperature over 101.   You have abdominal pain or discomfort that is severe or gets worse throughout the day.     Polyp and diverticulosis information provided  I will be in touch with you about the polyp pathology report in about a week  You're scheduled to have your liver cyst drained tomorrow morning by Dr. Bonnielee Haff at [facility]. Need to be there by  6:30 in the morning. Anticipate getting to go home by around noon tomorrow  EGD Discharge instructions Please read the instructions outlined below and refer to this sheet in the next few weeks. These discharge instructions provide you with general information on caring for yourself after you leave the hospital. Your doctor may also give you specific instructions. While your treatment has been planned according to the most current medical practices available, unavoidable complications occasionally occur. If you have any problems or questions after discharge, please call your doctor. ACTIVITY  You may resume your regular activity but move at a slower pace  for the next 24 hours.   Take frequent rest periods for the next 24 hours.   Walking will help expel (get rid of) the air and reduce the bloated feeling in your abdomen.   No driving for 24 hours (because of the anesthesia (medicine) used during the test).   You may shower.   Do not sign any important legal documents or operate any machinery for 24 hours (because of the anesthesia used during the test).  NUTRITION  Drink plenty of fluids.   You may resume your normal diet.    Begin with a light meal and progress to your normal diet.   Avoid alcoholic beverages for 24 hours or as instructed by your caregiver.  MEDICATIONS  You may resume your normal medications unless your caregiver tells you otherwise.  WHAT YOU CAN EXPECT TODAY  You may experience abdominal discomfort such as a feeling of fullness or gas pains.  FOLLOW-UP  Your doctor will discuss the results of your test with you.  SEEK IMMEDIATE MEDICAL ATTENTION IF ANY OF THE FOLLOWING OCCUR:  Excessive nausea (feeling sick to your stomach) and/or vomiting.   Severe abdominal pain and distention (swelling).   Trouble swallowing.   Temperature over 101 F (37.8 C).   Rectal bleeding or vomiting of blood.    EGD Discharge instructions Please read the instructions outlined below and refer to this sheet in the next few weeks. These discharge instructions provide you with general information on caring for yourself after you leave the hospital. Your doctor may also give you specific instructions. While your treatment has been planned according to the most current medical practices available, unavoidable complications occasionally occur. If you have any problems or questions after discharge, please call your doctor. ACTIVITY  You may resume your regular activity but move at a slower pace for the next 24 hours.   Take frequent rest periods for the next 24 hours.   Walking will help expel (get rid of) the air and reduce the bloated feeling in your abdomen.   No driving for 24 hours (because of the anesthesia (medicine) used during the test).   You may shower.   Do not sign any important legal documents or operate any machinery for 24 hours (because of the anesthesia used during the test).  NUTRITION  Drink plenty of fluids.   You may resume your normal diet.   Begin with a light meal and progress to your normal diet.   Avoid alcoholic beverages for 24 hours or as instructed by your  caregiver.  MEDICATIONS  You may resume your normal medications unless your caregiver tells you otherwise.  WHAT YOU CAN EXPECT TODAY  You may experience abdominal discomfort such as a feeling of fullness or gas pains.  FOLLOW-UP  Your doctor will discuss the results of your test with you.  SEEK IMMEDIATE MEDICAL ATTENTION IF ANY OF THE FOLLOWING OCCUR:  Excessive nausea (feeling sick to your stomach) and/or vomiting.   Severe abdominal pain and distention (swelling).   Trouble swallowing.   Temperature over 101 F (37.8 C).   Rectal bleeding or vomiting of blood.     GERD information provided.  Begin Dexilant 60 mg orally daily. Please give my office for free samples.  Further recommendations to follow pending review of pathology report   Do not resume Coumadin until told to do so the radiologist down in [place].    CT Guided Liver Aspiration on 11/02/2011 in am @ [facility] @  6:30 am.     Gastroesophageal Reflux Disease, Adult Gastroesophageal reflux disease (GERD) happens when acid from your stomach flows up into the esophagus. When acid comes in contact with the esophagus, the acid causes soreness (inflammation) in the esophagus. Over time, GERD may create small holes (ulcers) in the lining of the esophagus. CAUSES  Increased body weight. This puts pressure on the stomach, making acid rise from the stomach into the esophagus. Smoking. This increases acid production in the stomach. Drinking alcohol. This causes decreased pressure in the lower esophageal sphincter (valve or ring of muscle between the esophagus and stomach), allowing acid from the stomach into the esophagus. Late evening meals and a full stomach. This increases pressure and acid production in the stomach. A malformed lower esophageal sphincter. Sometimes, no cause is found. SYMPTOMS  Burning pain in the lower part of the mid-chest behind the breastbone and in the  mid-stomach area. This may occur twice a week or more often. Trouble swallowing. Sore throat. Dry cough. Asthma-like symptoms including chest tightness, shortness of breath, or wheezing. DIAGNOSIS  Your caregiver may be able to diagnose GERD based on your symptoms. In some cases, X-rays and other tests may be done to check for complications or to check the condition of your stomach and esophagus. TREATMENT  Your caregiver may recommend over-the-counter or prescription medicines to help decrease acid production. Ask your caregiver before starting or adding any new medicines.  HOME CARE INSTRUCTIONS  Change the factors that you can control. Ask your caregiver for guidance concerning weight loss, quitting smoking, and alcohol consumption. Avoid foods and drinks that make your symptoms worse, such as: Caffeine or alcoholic drinks. Chocolate. Peppermint or mint flavorings. Garlic and onions. Spicy foods. Citrus fruits, such as oranges, lemons, or limes. Tomato-based foods such as sauce, chili, salsa, and pizza. Fried and fatty foods. Avoid lying down for the 3 hours prior to your bedtime or prior to taking a nap. Eat small, frequent meals instead of large meals. Wear loose-fitting clothing. Do not wear anything tight around your waist that causes pressure on your stomach. Raise the head of your bed 6 to 8 inches with wood blocks to help you sleep. Extra pillows will not help. Only take over-the-counter or prescription medicines for pain, discomfort, or fever as directed by your caregiver. Do not take aspirin, ibuprofen, or other nonsteroidal anti-inflammatory drugs (NSAIDs). SEEK IMMEDIATE MEDICAL CARE IF:  You have pain in your arms, neck, jaw, teeth, or back. Your pain increases or changes in intensity or duration. You develop nausea, vomiting, or sweating (diaphoresis). You develop shortness of breath, or you faint. Your vomit is green, yellow, black, or looks like coffee grounds or  blood. Your stool is red, bloody, or black. These symptoms could be signs of other problems, such as heart disease, gastric bleeding, or esophageal bleeding. MAKE SURE YOU:  Understand these instructions. Will watch your condition. Will get help right away if you are not doing well or get worse. Document Released: 10/18/2004 Document Revised: 04/02/2011 Document Reviewed: 07/28/2010 [facility] Patient Information 2013 [place], [place].    Nothing to eat or drink after midnight tonight.    Stay off Coumadin and do not take Aspirin.  May take regular medications in the morning with a sip of water.  .    Please have someone with you to drive you home tomorrow.  You will probably be finished with the  Procedure around noon tomorrow   Hiatal Hernia A hiatal hernia occurs when a  part of the stomach slides above the diaphragm. The diaphragm is the thin muscle separating the belly (abdomen) from the chest. A hiatal hernia can be something you are born with or develop over time. Hiatal hernias may allow stomach acid to flow back into your esophagus, the tube which carries food from your mouth to your stomach. If this acid causes problems it is called GERD (gastro-esophageal reflux disease).  SYMPTOMS  Common symptoms of GERD are heartburn (burning in your chest). This is worse when lying down or bending over. It may also cause belching and indigestion. Some of the things which make GERD worse are:  Increased weight pushes on stomach making acid rise more easily.  Smoking markedly increases acid production.  Alcohol decreases lower esophageal sphincter pressure (valve between stomach and esophagus), allowing acid from stomach into esophagus.  Late evening meals and going to bed with a full stomach increases pressure.  Anything that causes an increase in acid production.  Lower esophageal sphincter incompetence. DIAGNOSIS  Hiatal hernia is often diagnosed with x-rays of your stomach and  small bowel. This is called an UGI (upper gastrointestinal x-ray). Sometimes a gastroscopic procedure is done. This is a procedure where your caregiver uses a flexible instrument to look into the stomach and small bowel. HOME CARE INSTRUCTIONS   Try to achieve and maintain an ideal body weight.  Avoid drinking alcoholic beverages.  Stop smoking.  Put the head of your bed on 4 to 6 inch blocks. This will keep your head and esophagus higher than your stomach. If you cannot use blocks, sleep with several pillows under your head and shoulders.  Over-the-counter medications will decrease acid production. Your caregiver can also prescribe medications for this. Take as directed.  1/2 to 1 teaspoon of an antacid taken every hour while awake, with meals and at bedtime, will neutralize acid.  Do not take aspirin, ibuprofen (Advil or Motrin), or other nonsteroidal anti-inflammatory drugs.  Do not wear tight clothing around your chest or stomach.  Eat smaller meals and eat more frequently. This keeps your stomach from getting too full. Eat slowly.  Do not lie down for 2 or 3 hours after eating. Do not eat or drink anything 1 to 2 hours before going to bed.  Avoid caffeine beverages (colas, coffee, cocoa, tea), fatty foods, citrus fruits and all other foods and drinks that contain acid and that seem to increase the problems.  Avoid bending over, especially after eating. Also avoid straining during bowel movements or when urinating or lifting things. Anything that increases the pressure in your belly increases the amount of acid that may be pushed up into your esophagus. SEEK IMMEDIATE MEDICAL CARE IF:  There is change in location (pain in arms, neck, jaw, teeth or back) of your pain, or the pain is getting worse.  You also experience nausea, vomiting, sweating (diaphoresis), or shortness of breath.  You develop continual vomiting, vomit blood or coffee ground material, have bright red blood in  your stools, or have black tarry stools. Some of these symptoms could signal other problems such as heart disease. MAKE SURE YOU:   Understand these instructions.  Monitor your condition.  Contact your caregiver if you are not doing well or are getting worse. Document Released: 03/31/2003 Document Revised: 04/02/2011 Document Reviewed: 01/08/2005 [facility] Patient Information 2013 [place], [place]. Diverticulosis Diverticulosis is a common condition that develops when small pouches (diverticula) form in the wall of the colon. The risk of diverticulosis increases with age. It  happens more often in people who eat a low-fiber diet. Most individuals with diverticulosis have no symptoms. Those individuals with symptoms usually experience abdominal pain, constipation, or loose stools (diarrhea). HOME CARE INSTRUCTIONS   Increase the amount of fiber in your diet as directed by your caregiver or dietician. This may reduce symptoms of diverticulosis.  Your caregiver may recommend taking a dietary fiber supplement.  Drink at least 6 to 8 glasses of water each day to prevent constipation.  Try not to strain when you have a bowel movement.  Your caregiver may recommend avoiding nuts and seeds to prevent complications, although this is still an uncertain benefit.  Only take over-the-counter or prescription medicines for pain, discomfort, or fever as directed by your caregiver. FOODS WITH HIGH FIBER CONTENT INCLUDE:  Fruits. Apple, peach, pear, tangerine, raisins, prunes.  Vegetables. Brussels sprouts, asparagus, broccoli, cabbage, carrot, cauliflower, romaine lettuce, spinach, summer squash, tomato, winter squash, zucchini.  Starchy Vegetables. Baked beans, kidney beans, lima beans, split peas, lentils, potatoes (with skin).  Grains. Whole wheat bread, brown rice, bran flake cereal, plain oatmeal, white rice, shredded wheat, bran muffins. SEEK IMMEDIATE MEDICAL CARE IF:   You develop increasing  pain or severe bloating.  You have an oral temperature above 102 F (38.9 C), not controlled by medicine.  You develop vomiting or bowel movements that are bloody or black. Document Released: 10/06/2003 Document Revised: 04/02/2011 Document Reviewed: 06/08/2009 [facility] Patient Information 2013 [place], [place]. Colon Polyps Polyps are lumps of extra tissue growing inside the body. Polyps can grow in the large intestine (colon). Most colon polyps are noncancerous (benign). However, some colon polyps can become cancerous over time. Polyps that are larger than a pea may be harmful. To be safe, caregivers remove and test all polyps. CAUSES  Polyps form when mutations in the genes cause your cells to grow and divide even though no more tissue is needed. RISK FACTORS There are a number of risk factors that can increase your chances of getting colon polyps. They include:  Being older than 50 years.  Family history of colon polyps or colon cancer.  Long-term colon diseases, such as colitis or Crohn disease.  Being overweight.  Smoking.  Being inactive.  Drinking too much alcohol. SYMPTOMS  Most small polyps do not cause symptoms. If symptoms are present, they may include:  Blood in the stool. The stool may look dark red or black.  Constipation or diarrhea that lasts longer than 1 week. DIAGNOSIS People often do not know they have polyps until their caregiver finds them during a regular checkup. Your caregiver can use 4 tests to check for polyps:  Digital rectal exam. The caregiver wears gloves and feels inside the rectum. This test would find polyps only in the rectum.  Barium enema. The caregiver puts a liquid called barium into your rectum before taking X-rays of your colon. Barium makes your colon look white. Polyps are dark, so they are easy to see in the X-ray pictures.  Sigmoidoscopy. A thin, flexible tube (sigmoidoscope) is placed into your rectum. The sigmoidoscope has a  light and tiny camera in it. The caregiver uses the sigmoidoscope to look at the last third of your colon.  Colonoscopy. This test is like sigmoidoscopy, but the caregiver looks at the entire colon. This is the most common method for finding and removing polyps. TREATMENT  Any polyps will be removed during a sigmoidoscopy or colonoscopy. The polyps are then tested for cancer. PREVENTION  To help lower your  risk of getting more colon polyps:  Eat plenty of fruits and vegetables. Avoid eating fatty foods.  Do not smoke.  Avoid drinking alcohol.  Exercise every day.  Lose weight if recommended by your caregiver.  Eat plenty of calcium and folate. Foods that are rich in calcium include milk, cheese, and broccoli. Foods that are rich in folate include chickpeas, kidney beans, and spinach. HOME CARE INSTRUCTIONS Keep all follow-up appointments as directed by your caregiver. You may need periodic exams to check for polyps. SEEK MEDICAL CARE IF: You notice bleeding during a bowel movement. Document Released: 10/05/2003 Document Revised: 04/02/2011 Document Reviewed: 03/20/2011 [facility] Patient Information 2013 [place], [place].  Diet for Gastroesophageal Reflux Disease, Adult    Reflux (acid reflux) is when acid from your stomach flows up into the esophagus. When acid comes in contact with the esophagus, the acid causes irritation and soreness (inflammation) in the esophagus. When reflux happens often or so severely that it causes damage to the esophagus, it is called gastroesophageal reflux disease (GERD). Nutrition therapy can help ease the discomfort of GERD. FOODS OR DRINKS TO AVOID OR LIMIT  Smoking or chewing tobacco. Nicotine is one of the most potent stimulants to acid production in the gastrointestinal tract.  Caffeinated and decaffeinated coffee and black tea.  Regular or low-calorie carbonated beverages or energy drinks (caffeine-free carbonated beverages are allowed).    Strong spices, such as black pepper, white pepper, red pepper, cayenne, curry powder, and chili powder.  Peppermint or spearmint.  Chocolate.  High-fat foods, including meats and fried foods. Extra added fats including oils, butter, salad dressings, and nuts. Limit these to less than 8 tsp per day.  Fruits and vegetables if they are not tolerated, such as citrus fruits or tomatoes.  Alcohol.  Any food that seems to aggravate your condition. If you have questions regarding your diet, call your caregiver or a registered dietitian. OTHER THINGS THAT MAY HELP GERD INCLUDE:   Eating your meals slowly, in a relaxed setting.  Eating 5 to 6 small meals per day instead of 3 large meals.  Eliminating food for a period of time if it causes distress.  Not lying down until 3 hours after eating a meal.  Keeping the head of your bed raised 6 to 9 inches (15 to 23 cm) by using a foam wedge or blocks under the legs of the bed. Lying flat may make symptoms worse.  Being physically active. Weight loss may be helpful in reducing reflux in overweight or obese adults.  Wear loose fitting clothing EXAMPLE MEAL PLAN This meal plan is approximately 2,000 calories based on https://www.[facility].org/ meal planning guidelines. Breakfast   cup cooked oatmeal.  1 cup strawberries.  1 cup low-fat milk.  1 oz almonds. Snack  1 cup cucumber slices.  6 oz yogurt (made from low-fat or fat-free milk). Lunch  2 slice whole-wheat bread.  2 oz sliced [place].  2 tsp mayonnaise.  1 cup blueberries.  1 cup snap peas. Snack  6 whole-wheat crackers.  1 oz string cheese. Dinner   cup brown rice.  1 cup mixed veggies.  1 tsp olive oil.  3 oz grilled fish. Document Released: 01/08/2005 Document Revised: 04/02/2011 Document Reviewed: 11/24/2010 [facility] Patient Information 2013 [place], [place]. Diet for Peptic Ulcer Disease Peptic ulcer disease is a term used to describe open sores in the  stomach and digestive tract. These sores can be painful, and the pain can be worse when the stomach is empty. These ulcers  can lead to bleeding in the esophagus, stomach, and intestines (gastrointestinaltract). Nutrition therapy can help ease the discomfort of peptic ulcer disease.   GENERAL DIET GUIDELINES FOR PEPTIC ULCER DISEASE  Your diet should be rich in fruits, vegetables, and whole grains. It should also be low in fat.   Avoid foods that can irritate your sores.   Avoid foods that are not tolerated or foods that cause pain. This may be different for different people. FOODS AND DRINKS TO AVOID  High-fat dairy and milk products including whole milk, cream, chocolate milk, butter, sour cream, or cream cheese.   High-fat meats and poultry including hot dogs, fried meats or poultry, meats with skin, sausage, or bacon.   Pepper.  Alcohol.  Chocolate.  Tea, coffee, and cola (regular and caffeine).  Lard or hydrogenated oils such as stick margarine.  SAMPLE MEAL PLAN This meal plan is approximately 2000 calories based on https://www.[facility].org/ meal planning guidelines.  Breakfast   cup cooked oatmeal.  1 cup strawberries.  1 cup low-fat milk.  1 oz almonds. Snack  1 cup cucumber slices.  6 oz yogurt (made from low-fat or fat-free milk). Lunch  2 slices whole-wheat bread.  2 oz sliced [place].  2 tsp mayonnaise.  1 cup blueberries.  1 cup snap peas. Snack  6 whole-wheat crackers.  1 oz string cheese. Dinner   cup brown rice.  1 cup mixed veggies.  1 tsp olive oil.  3 oz grilled fish. Document Released: 04/02/2011 Document Reviewed: 04/02/2011 [facility] Patient Information 2013 [place], [place].  Marland Kitchen

## 2011-11-01 NOTE — Op Note (Signed)
[facility] [address] [place], [address]   ENDOSCOPY PROCEDURE REPORT  PATIENT: Tristan Lopez, Tristan Lopez  MR#: [id number] BIRTHDATE: December 19, 1930 , 80  yrs. old GENDER: Male ENDOSCOPIST: R.  Roetta Sessions, MD FACP FACG REFERRED BY:  Kari Baars, M.D. PROCEDURE DATE:  11/01/2011 PROCEDURE:     EGD with gastric biopsy  INDICATIONS:     Long-standing GERD symptoms. Recent right upper quadrant abdominal pain. Large hepatic cyst.  INFORMED CONSENT:   The risks, benefits, limitations, alternatives and imponderables have been discussed.  The potential for biopsy, esophogeal dilation, etc. have also been reviewed.  Questions have been answered.  All parties agreeable.  Please see the history and physical in the medical record for more information.  MEDICATIONS:    Demerol 50 mg IV and Versed 3 mg IV. Cetacaine spray.  DESCRIPTION OF PROCEDURE:   The EG-[id number] ([id number])  endoscope was introduced through the mouth and advanced to the second portion of the duodenum without difficulty or limitations.  The mucosal surfaces were surveyed very carefully during advancement of the scope and upon withdrawal.  Retroflexion view of the proximal stomach and esophagogastric junction was performed.      FINDINGS:  Patulous EG junction. Circumferential distal esophageal erosions with the largest being a linear erosion 6-7 mm up from the GE junction. No Barrett's esophagus. Stomach empty. 3 cm hilar hernia. Multiple antral erosions with an area of possible partial healing gastric ulcer. No infiltrating process seen. Pylorus patent. Normal first and second portion of the duodenum  THERAPEUTIC / DIAGNOSTIC MANEUVERS PERFORMED:  biopsies of the abnormal antral mucosa taken for histologic study   COMPLICATIONS:  None  IMPRESSION:  Erosive reflux esophagitis along with a patulous EG junction. Hiatal hernia. Antral erosions with possible area of healing ulceration-status post  biopsy  RECOMMENDATIONS:  Begin Dexilant 60 mg orally daily. Patient is to go by my office for free samples. GERD literature provided.  See colonoscopy report.    _______________________________ R. Roetta Sessions, MD FACP [facility] eSigned:  R. Roetta Sessions, MD FACP [facility] 11/01/2011 10:40 AM     CC:  PATIENT NAME:  Tristan Lopez, Tristan Lopez MR#: [id number]

## 2011-11-02 ENCOUNTER — Ambulatory Visit (HOSPITAL_COMMUNITY)
Admission: RE | Admit: 2011-11-02 | Discharge: 2011-11-02 | Disposition: A | Discharge status: Home / Self Care | Payer: Medicare Other | Source: Ambulatory Visit | Attending: Internal Medicine | Admitting: Internal Medicine

## 2011-11-02 ENCOUNTER — Encounter (HOSPITAL_COMMUNITY): Payer: Self-pay

## 2011-11-02 VITALS — BP 130/63 | HR 70 | Temp 97.2°F | Resp 18 | Ht 68.0 in | Wt 242.0 lb

## 2011-11-02 DIAGNOSIS — K7689 Other specified diseases of liver: Secondary | ICD-10-CM | POA: Insufficient documentation

## 2011-11-02 DIAGNOSIS — M25519 Pain in unspecified shoulder: Secondary | ICD-10-CM | POA: Insufficient documentation

## 2011-11-02 DIAGNOSIS — R1011 Right upper quadrant pain: Secondary | ICD-10-CM | POA: Insufficient documentation

## 2011-11-02 HISTORY — DX: Gastro-esophageal reflux disease with esophagitis, without bleeding: K21.00

## 2011-11-02 HISTORY — DX: Gastro-esophageal reflux disease with esophagitis: K21.0

## 2011-11-02 LAB — CBC
Hemoglobin: 11.7 g/dL — ABNORMAL LOW (ref 13.0–17.0)
MCH: 29.5 pg (ref 26.0–34.0)
MCV: 89.9 fL (ref 78.0–100.0)
RBC: 3.97 MIL/uL — ABNORMAL LOW (ref 4.22–5.81)

## 2011-11-02 LAB — PROTIME-INR
INR: 1.34 (ref 0.00–1.49)
Prothrombin Time: 16.3 seconds — ABNORMAL HIGH (ref 11.6–15.2)

## 2011-11-02 LAB — APTT: aPTT: 39 seconds — ABNORMAL HIGH (ref 24–37)

## 2011-11-02 MED ORDER — FENTANYL CITRATE 0.05 MG/ML IJ SOLN
INTRAMUSCULAR | Status: DC | PRN
  Administered 2011-11-02: 50 ug via INTRAVENOUS

## 2011-11-02 MED ORDER — SODIUM CHLORIDE 0.9 % IV SOLN: INTRAVENOUS | Status: DC

## 2011-11-02 MED ORDER — CEFAZOLIN SODIUM 1-5 GM-% IV SOLN
INTRAVENOUS | Status: AC
  Administered 2011-11-02: 1000 mg
  Filled 2011-11-02: qty 50

## 2011-11-02 MED ORDER — MIDAZOLAM HCL 2 MG/2ML IJ SOLN
INTRAMUSCULAR | Status: AC
  Filled 2011-11-02: qty 4

## 2011-11-02 MED ORDER — FENTANYL CITRATE 0.05 MG/ML IJ SOLN
INTRAMUSCULAR | Status: AC
  Filled 2011-11-02: qty 4

## 2011-11-02 MED ORDER — MIDAZOLAM HCL 2 MG/2ML IJ SOLN
INTRAMUSCULAR | Status: DC | PRN
  Administered 2011-11-02: 2 mg via INTRAVENOUS

## 2011-11-02 NOTE — Procedures (Signed)
Liver cyst drain 8 Fr No comp

## 2011-11-02 NOTE — H&P (Signed)
Tristan Lopez is an 78 y.o. male.   Chief Complaint: hepatic fluid collection of uncertain etiology. Patient presents for aspiration / possible drainage. HPI: Abdominal pain with nausea, then right shoulder pain.  CT scan reveals two specific large hepatic collections. Recent EGD and colonoscopies negative for malignancies - noted esophagitis and hiatal hernia.    Past Medical History  Diagnosis Date  . Atrial fibrillation, controlled     chronic coumadin  . COPD (chronic obstructive pulmonary disease)     sometimes uses Oxygen at home  . Hernia     hiatal hernia  . Abdominal aortic aneurysm without rupture     followed by Dr. Juanetta Lopez  . Kidney stones   . Liver cyst   . Kidney cysts   . Sleep apnea     uses CPAP  . Reflux esophagitis     Past Surgical History  Procedure Date  . Hernia repair     x2, bilateral inguinal   . Kidney stone surgery   . Knee arthroscopy     X2  . Colonoscopy     2003, no polyps per patient. Dr. Aleene Lopez  . Nasal endoscopy     Family History  Problem Relation Age of Onset  . Colon cancer Neg Hx   . Liver disease Neg Hx   . Breast cancer Mother   . Heart disease Father     pacemaker   Social History:  reports that he has quit smoking. His smoking use included Cigarettes. He smoked .5 packs per day. He does not have any smokeless tobacco history on file. He reports that he does not drink alcohol or use illicit drugs.  Allergies:  Allergies  Allergen Reactions  . Latex Rash    Results for orders placed during the hospital encounter of 11/02/11 (from the past 48 hour(s))  APTT     Status: Abnormal   Collection Time   11/02/11  6:56 AM      Component Value Range Comment   aPTT 39 (*) 24 - 37 seconds   CBC     Status: Abnormal   Collection Time   11/02/11  6:56 AM      Component Value Range Comment   WBC 7.3  4.0 - 10.5 K/uL    RBC 3.97 (*) 4.22 - 5.81 MIL/uL    Hemoglobin 11.7 (*) 13.0 - 17.0 g/dL    HCT 16.1 (*) 09.6 - 52.0 %      MCV 89.9  78.0 - 100.0 fL    MCH 29.5  26.0 - 34.0 pg    MCHC 32.8  30.0 - 36.0 g/dL    RDW 04.5  40.9 - 81.1 %    Platelets 270  150 - 400 K/uL   PROTIME-INR     Status: Abnormal   Collection Time   11/02/11  6:56 AM      Component Value Range Comment   Prothrombin Time 16.3 (*) 11.6 - 15.2 seconds    INR 1.34  0.00 - 1.49     Review of Systems  Constitutional: Negative for fever, chills and diaphoresis.  Respiratory: Negative for cough, hemoptysis, sputum production, shortness of breath and wheezing.   Cardiovascular: Positive for palpitations and orthopnea. Negative for chest pain.       Exertional dyspnea   Gastrointestinal: Positive for heartburn, nausea and abdominal pain. Negative for blood in stool.  Genitourinary: Positive for frequency.  Musculoskeletal: Positive for joint pain. Negative for falls.  Skin: Negative.   Neurological:  Positive for weakness. Negative for dizziness, speech change, focal weakness, seizures, loss of consciousness and headaches.  Endo/Heme/Allergies: Bruises/bleeds easily.  Psychiatric/Behavioral: Negative for depression and memory loss. The patient is not nervous/anxious and does not have insomnia.     Blood pressure 116/72, pulse 72, temperature 98.3 F (36.8 C), temperature source Oral, resp. rate 16, height 5\' 8"  (1.727 m), weight 242 lb (109.77 kg), SpO2 99.00%. Physical Exam  Constitutional: He is oriented to person, place, and time. He appears well-developed and well-nourished. No distress.  HENT:  Head: Normocephalic and atraumatic.  Eyes: Pupils are equal, round, and reactive to light.  Cardiovascular: Exam reveals no gallop and no friction rub.   No murmur heard.      IR, IR   Respiratory: Effort normal. No respiratory distress. He has no wheezes. He has no rales.       Dyspneic with conversing off oxygen   GI: Soft. Bowel sounds are normal. He exhibits distension. He exhibits no mass. There is tenderness. There is no guarding.   Musculoskeletal: He exhibits no edema.       Cane for ambulation   Neurological: He is alert and oriented to person, place, and time.  Skin: Skin is warm and dry.  Psychiatric: He has a normal mood and affect. His behavior is normal. Judgment and thought content normal.     Assessment/Plan Procedure details for hepatic fluid collection aspiration and or percutaneous drainage discussed in detail by myself and Dr. Bonnielee Lopez to patient and family with all their questions answered to their satisfaction. Written consent obtained. If no complications - patient will resume coumadin tomorrow.   Tristan Lopez D 11/02/2011, 9:01 AM

## 2011-11-06 ENCOUNTER — Encounter: Payer: Self-pay | Admitting: Internal Medicine

## 2011-11-07 ENCOUNTER — Encounter: Payer: Self-pay | Admitting: *Deleted

## 2011-11-07 ENCOUNTER — Telehealth: Payer: Self-pay

## 2011-11-07 NOTE — Telephone Encounter (Signed)
Received notice from pharmacy that pt will need PA for dexilant, called pt to see if he has ever tried any of the insurances preferred ppi's. Pt's wife stated he had never tried any ppi's before. Insurance prefers omeprazole, pantoprazole, lansoprazole and generic zegerid. Informed pts wife that pt would have to try the preferred meds first. She stated it was ok to send new rx to [facility] in [place].

## 2011-11-07 NOTE — Telephone Encounter (Signed)
Let's trial Prilosec. Please offer samples, #[phone]. If no improvement, trial Protonix next. Pt will need to call us in about 10 days with update. If Prilosec does well, we can send in a rx.

## 2011-11-08 ENCOUNTER — Encounter: Payer: Self-pay | Admitting: Internal Medicine

## 2011-11-08 NOTE — Telephone Encounter (Signed)
pts wife is aware, she takes otc prilosec and will just give him some of that and will call with PR.

## 2011-11-30 ENCOUNTER — Encounter: Payer: Self-pay | Admitting: Internal Medicine

## 2011-11-30 ENCOUNTER — Ambulatory Visit (INDEPENDENT_AMBULATORY_CARE_PROVIDER_SITE_OTHER): Payer: Medicare Other | Admitting: Internal Medicine

## 2011-11-30 VITALS — BP 118/71 | HR 77 | Temp 97.4°F | Ht 68.0 in | Wt 241.4 lb

## 2011-11-30 DIAGNOSIS — K7689 Other specified diseases of liver: Secondary | ICD-10-CM

## 2011-11-30 DIAGNOSIS — K219 Gastro-esophageal reflux disease without esophagitis: Secondary | ICD-10-CM

## 2011-11-30 NOTE — Patient Instructions (Addendum)
Begin generic protonix 40 mg twice daily  GERD information  Office visit with [place] in 3 months  See the ENT physician regarding hoarseness

## 2011-11-30 NOTE — Progress Notes (Signed)
Primary Care Physician:  Fredirick Maudlin, MD Primary Gastroenterologist:  Dr. Jena Gauss  Pre-Procedure History & Physical: HPI:  Tristan Lopez is a 78 y.o. male here for patient is here for followup of gross reflux esophagitis and large right hepatic cyst-status post recent percutaneous drainage.  The patient has not appreciated much improvement in his reflux symptoms since EGD. However, he was not able to get his Dexilant because of cost. He's been taking over-the-counter Prilosec. He does state his right up quadrant bowel pain has essentially resolved since he has large right hepatic cyst. Colonic adenoma removed at time of colonoscopy.  Patient complains of chronic hoarseness and phlegm. History of long-term tobacco exposure.  Past Medical History  Diagnosis Date  . Atrial fibrillation, controlled     chronic coumadin  . COPD (chronic obstructive pulmonary disease)     sometimes uses Oxygen at home  . Hernia     hiatal hernia  . Abdominal aortic aneurysm without rupture     followed by [name]  . Kidney stones   . Liver cyst   . Kidney cysts   . Sleep apnea     uses CPAP  . Reflux esophagitis 10/13  . Tubular adenoma 10/13  . Diverticulosis 10/13    pancolonic  . Hemorrhoids 10/13  . Hiatal hernia     Past Surgical History  Procedure Date  . Hernia repair     x2, bilateral inguinal   . Kidney stone surgery   . Knee arthroscopy     X2  . Colonoscopy     2003, no polyps per patient. [name]  . Nasal endoscopy   . Esophagogastroduodenoscopy 11/01/11    Dr. Jena Gauss- erosive reflux esophagitis along with a patulous EG junction, hialtal hernia, antral erosions with possible area of healing ulceration- s/p bx= chronic erosive gastritis, no malignancy  . Colonoscopy 11/01/11    Dr. Jena Gauss- tubular adenoma,suboptimal preparation, internal hemorrhoids, o/w normal rectum. pancolonic diverticulosis    Prior to Admission medications   Medication Sig Start Date End Date  Taking? Authorizing Provider  amLODipine (NORVASC) 5 MG tablet Take 5 mg by mouth daily.   Yes Historical Provider, MD  aspirin EC 81 MG tablet Take 81 mg by mouth daily.   Yes Historical Provider, MD  benazepril (LOTENSIN) 40 MG tablet Take 40 mg by mouth daily.   Yes Historical Provider, MD  dexlansoprazole (DEXILANT) 60 MG capsule Take 60 mg by mouth daily.   Yes Historical Provider, MD  finasteride (PROSCAR) 5 MG tablet Take 5 mg by mouth daily.   Yes Historical Provider, MD  Garlic 1000 MG CAPS Take 1,000 mg by mouth daily.   Yes Historical Provider, MD  HYDROcodone-acetaminophen (VICODIN) 5-500 MG per tablet Take 1 tablet by mouth every 6 (six) hours as needed for pain. 10/17/11  Yes Joselyn Arrow, NP  losartan-hydrochlorothiazide (HYZAAR) 100-25 MG per tablet Take 1 tablet by mouth daily.   Yes Historical Provider, MD  metoprolol (LOPRESSOR) 50 MG tablet Take 25 mg by mouth daily.   Yes Historical Provider, MD  Multiple Vitamin (MULTIVITAMIN) tablet Take 1 tablet by mouth daily.   Yes Historical Provider, MD  oxybutynin (DITROPAN-XL) 10 MG 24 hr tablet Take 10 mg by mouth daily.   Yes Historical Provider, MD  polyethylene glycol (MIRALAX / GLYCOLAX) packet Take 17 g by mouth daily.   Yes Historical Provider, MD  polyethylene glycol-electrolytes (TRILYTE) 420 G solution Take 4,000 mLs by mouth as directed. 10/10/11  Yes Molly Maduro  Sonnie Alamo, MD  potassium chloride SA (K-DUR,KLOR-CON) 20 MEQ tablet Take 20 mEq by mouth daily.   Yes Historical Provider, MD  rosuvastatin (CRESTOR) 10 MG tablet Take 10 mg by mouth daily.   Yes Historical Provider, MD  Tamsulosin HCl (FLOMAX) 0.4 MG CAPS Take 0.4 mg by mouth daily.   Yes Historical Provider, MD  warfarin (COUMADIN) 5 MG tablet Take 5-7.5 mg by mouth daily. Patient takes 7.5mg  on(Tue.,Thur.,Sat.,Sun) and Patient takes 5mg  on(Mon.,Wed.,Fri.,)   Yes Historical Provider, MD    Allergies as of 11/30/2011 - Review Complete 11/30/2011  Allergen Reaction  Noted  . Latex Rash 10/04/2011    Family History  Problem Relation Age of Onset  . Colon cancer Neg Hx   . Liver disease Neg Hx   . Breast cancer Mother   . Heart disease Father     pacemaker    History   Social History  . Marital Status: Married    Spouse Name: N/A    Number of Children: 3  . Years of Education: N/A   Occupational History  .     Social History Main Topics  . Smoking status: Former Smoker -- 0.5 packs/day    Types: Cigarettes  . Smokeless tobacco: Not on file     Comment: quit about 10 years  . Alcohol Use: No  . Drug Use: No  . Sexually Active: Not on file   Other Topics Concern  . Not on file   Social History Narrative  . No narrative on file    Review of Systems: See HPI, otherwise negative ROS  Physical Exam: BP 118/71  Pulse 77  Temp 97.4 F (36.3 C) (Temporal)  Ht 5\' 8"  (1.727 m)  Wt 241 lb 6.4 oz (109.498 kg)  BMI 36.70 kg/m2 General:   Alert,  Well-developed, well-nourished, pleasant and cooperative in NAD. He is accompanied by his wife and son Skin:  Intact without significant lesions or rashes. Eyes:  Sclera clear, no icterus.   Conjunctiva pink. Ears:  Normal auditory acuity. Nose:  No deformity, discharge,  or lesions. Mouth:  No deformity or lesions. Neck:  Supple; no masses or thyromegaly. No significant cervical adenopathy. Lungs:  Clear throughout to auscultation.   No wheezes, crackles, or rhonchi. No acute distress. Heart:  Regular rate and rhythm; no murmurs, clicks, rubs,  or gallops. Abdomen: Non-distended, normal bowel sounds.  Soft and nontender without appreciable mass or hepatosplenomegaly.  Pulses:  Normal pulses noted. Extremities:  Without clubbing or edema.  Impression/Plan:  Very nice 78 year old gentleman with erosive reflux esophagitis non-H. pylori gastritis and a huge right hepatic cysts - status post recent percutaneous drainage. He has been on inadequate acid suppression therapy. He does not feel like  he is improved all that much since his procedures. Long history of tobacco exposure. Hoarseness may or may not be related to reflux  Recommendations:  Begin pantoprazole 40 mg orally twice a day x3 months; then we'll reassess in the office. Hoarseness if related to reflux but take months to improve. However, given tobacco history, I advised the patient to see the ENT doctor for least one time of evaluation. Consider repeat colonoscopy in 5 years of overall health permits.

## 2012-01-22 ENCOUNTER — Encounter: Payer: Self-pay | Admitting: *Deleted

## 2012-03-07 ENCOUNTER — Ambulatory Visit: Payer: Medicare Other | Admitting: Internal Medicine

## 2012-03-12 ENCOUNTER — Encounter: Payer: Self-pay | Admitting: *Deleted

## 2012-03-13 ENCOUNTER — Encounter: Payer: Self-pay | Admitting: Cardiovascular Disease

## 2012-03-13 ENCOUNTER — Ambulatory Visit (INDEPENDENT_AMBULATORY_CARE_PROVIDER_SITE_OTHER): Payer: Medicare PPO | Admitting: Cardiovascular Disease

## 2012-03-13 VITALS — BP 133/65 | HR 60 | Wt 235.0 lb

## 2012-03-13 DIAGNOSIS — I482 Chronic atrial fibrillation, unspecified: Secondary | ICD-10-CM | POA: Insufficient documentation

## 2012-03-13 DIAGNOSIS — Z7901 Long term (current) use of anticoagulants: Secondary | ICD-10-CM

## 2012-03-13 DIAGNOSIS — I4891 Unspecified atrial fibrillation: Secondary | ICD-10-CM | POA: Insufficient documentation

## 2012-03-13 NOTE — Assessment & Plan Note (Signed)
He wants INR checked at [facility] practice but we cannot oversee this.  Discussed starting xarelto instead and given info  He will check with insurance.

## 2012-03-13 NOTE — Progress Notes (Signed)
Patient ID: Tristan Lopez, male   DOB: 11/11/1930, 79 y.o.   MRN: [id number] 79 yo usually seen by Dr Rockne Menghini.  Afib for over 15 years on chronic coumadin.  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 No history of CAD, CHF valve disease.  Activity level is good No chest pain PND or orthopnea.  Compliant with meds. In October had abdominal pain and ? Bleed into liver cyst.    ROS: Denies fever, malais, weight loss, blurry vision, decreased visual acuity, cough, sputum, SOB, hemoptysis, pleuritic pain, palpitaitons, heartburn, abdominal pain, melena, lower extremity edema, claudication, or rash.  All other systems reviewed and negative   General: Affect appropriate Healthy:  appears stated age HEENT: normal Neck supple with no adenopathy JVP normal no bruits no thyromegaly Lungs clear with no wheezing and good diaphragmatic motion Heart:  S1/S2 no murmur,rub, gallop or click PMI normal Abdomen: benighn, BS positve, no tenderness, no AAA no bruit.  No HSM or HJR Distal pulses intact with no bruits No edema Neuro non-focal Skin warm and dry No muscular weakness  Medications Current Outpatient Prescriptions  Medication Sig Dispense Refill  . amLODipine (NORVASC) 5 MG tablet Take 5 mg by mouth daily.      Marland Kitchen aspirin EC 81 MG tablet Take 81 mg by mouth daily.      . benazepril (LOTENSIN) 40 MG tablet Take 40 mg by mouth daily.      . finasteride (PROSCAR) 5 MG tablet Take 5 mg by mouth daily.      . Garlic 1000 MG CAPS Take 1,000 mg by mouth daily.      Marland Kitchen losartan-hydrochlorothiazide (HYZAAR) 100-25 MG per tablet Take 1 tablet by mouth daily.      . metoprolol (LOPRESSOR) 100 MG tablet Take 50 mg by mouth daily.      . Multiple Vitamin (MULTIVITAMIN) tablet Take 1 tablet by mouth daily.      Marland Kitchen oxybutynin (DITROPAN-XL) 10 MG 24 hr tablet Take 10 mg by mouth daily.      . pantoprazole (PROTONIX) 40 MG tablet       . polyethylene glycol (MIRALAX / GLYCOLAX) packet Take 17 g by mouth daily.      .  polyethylene glycol-electrolytes (TRILYTE) 420 G solution Take 4,000 mLs by mouth as directed.  4000 mL  0  . potassium chloride SA (K-DUR,KLOR-CON) 20 MEQ tablet Take 20 mEq by mouth daily.      . rosuvastatin (CRESTOR) 10 MG tablet Take 10 mg by mouth daily.      . Tamsulosin HCl (FLOMAX) 0.4 MG CAPS Take 0.4 mg by mouth daily.      Marland Kitchen warfarin (COUMADIN) 5 MG tablet Take 5-7.5 mg by mouth daily. Patient takes 5mg  on(Tue.,Wed., Thur.,Sat.,Sun) and Patient takes 7.5mg  on(Mon.,Fri.,)       No current facility-administered medications for this visit.    Allergies Latex  Family History: Family History  Problem Relation Age of Onset  . Colon cancer Neg Hx   . Liver disease Neg Hx   . Breast cancer Mother   . Heart disease Father     pacemaker    Social History: History   Social History  . Marital Status: Married    Spouse Name: N/A    Number of Children: 3  . Years of Education: N/A   Occupational History  .     Social History Main Topics  . Smoking status: Former Smoker -- 0.50 packs/day    Types: Cigarettes  . Smokeless tobacco:  Not on file     Comment: quit about 10 years  . Alcohol Use: No  . Drug Use: No  . Sexually Active: Not on file   Other Topics Concern  . Not on file   Social History Narrative  . No narrative on file    Electrocardiogram:  2/513   Slow afib  T wave inversions V12  Assessment and Plan

## 2012-03-13 NOTE — Assessment & Plan Note (Signed)
Rate is too slow.  D/C amlodipine and reassess in 8 weeks  If still in 50;s or low 60's consider changing lopressor to 25mg /day

## 2012-03-13 NOTE — Patient Instructions (Signed)
Your physician recommends that you schedule a follow-up appointment in: 8 weeks  Your physician has recommended you make the following change in your medication:  1 - STOP Lisinopril  Look over Xarelto information and let us know if you would like to switch

## 2012-03-14 ENCOUNTER — Ambulatory Visit (INDEPENDENT_AMBULATORY_CARE_PROVIDER_SITE_OTHER): Payer: Medicare PPO | Admitting: Internal Medicine

## 2012-03-14 ENCOUNTER — Encounter: Payer: Self-pay | Admitting: Internal Medicine

## 2012-03-14 VITALS — BP 124/72 | HR 76 | Temp 98.2°F | Ht 68.0 in | Wt 236.8 lb

## 2012-03-14 DIAGNOSIS — K5900 Constipation, unspecified: Secondary | ICD-10-CM

## 2012-03-14 DIAGNOSIS — R1031 Right lower quadrant pain: Secondary | ICD-10-CM

## 2012-03-14 DIAGNOSIS — K219 Gastro-esophageal reflux disease without esophagitis: Secondary | ICD-10-CM

## 2012-03-14 NOTE — Progress Notes (Signed)
Primary Care Physician: Primary Gastroenterologist:  Dr. Jena Gauss  Pre-Procedure History & Physical: HPI:  Tristan Lopez is a 79 y.o. male here for followup of GERD/courses. Saw Dr. Annalee Genta; GERD felt to be secondary to GERD. He's been on Protonix 40 mg orally twice daily. Reflux symptoms slowly improving. No dysphagia. Not having right upper quadrant symptoms related to hepatic cyst drainage. Does have right inguinal pain from time to time and pushes and inguinal area and although he really doesn't feel a bulge. CT last fall failed to demonstrate an inguinal hernia. This is been a problem for for him for the past several months. Otherwise, patient doing very well aside from bloating temporally related to constipation.  Past Medical History  Diagnosis Date  . Atrial fibrillation, controlled     chronic coumadin  . COPD (chronic obstructive pulmonary disease)     sometimes uses Oxygen at home  . Hernia     hiatal hernia  . Abdominal aortic aneurysm without rupture     followed by [name]  . Kidney stones   . Liver cyst     aspirated on 10/10/11- no malignancy on bx  . Kidney cysts   . Sleep apnea     uses CPAP  . Reflux esophagitis 10/13  . Tubular adenoma 10/13  . Diverticulosis 10/13    pancolonic  . Hemorrhoids 10/13  . Hiatal hernia   . Atrial fibrillation   . AAA (abdominal aortic aneurysm)   . HTN (hypertension)   . Hypercholesteremia   . BPH (benign prostatic hyperplasia)   . Pre-diabetes     Past Surgical History  Procedure Laterality Date  . Hernia repair      x2, bilateral inguinal   . Kidney stone surgery    . Knee arthroscopy      X2  . Colonoscopy      2003, no polyps per patient. [name]  . Nasal endoscopy    . Esophagogastroduodenoscopy  11/01/11    Dr. Jena Gauss- erosive reflux esophagitis along with a patulous EG junction, hialtal hernia, antral erosions with possible area of healing ulceration- s/p bx= chronic erosive gastritis, no malignancy  .  Colonoscopy  11/01/11    Dr. Jena Gauss- tubular adenoma,suboptimal preparation, internal hemorrhoids, o/w normal rectum. pancolonic diverticulosis    Prior to Admission medications   Medication Sig Start Date End Date Taking? Authorizing Provider  aspirin EC 81 MG tablet Take 81 mg by mouth daily.   Yes Historical Provider, MD  benazepril (LOTENSIN) 40 MG tablet Take 40 mg by mouth daily.   Yes Historical Provider, MD  finasteride (PROSCAR) 5 MG tablet Take 5 mg by mouth daily.   Yes Historical Provider, MD  Garlic 1000 MG CAPS Take 1,000 mg by mouth daily.   Yes Historical Provider, MD  losartan-hydrochlorothiazide (HYZAAR) 100-25 MG per tablet Take 1 tablet by mouth daily.   Yes Historical Provider, MD  metoprolol (LOPRESSOR) 100 MG tablet Take 50 mg by mouth daily.   Yes Historical Provider, MD  Multiple Vitamin (MULTIVITAMIN) tablet Take 1 tablet by mouth daily.   Yes Historical Provider, MD  oxybutynin (DITROPAN-XL) 10 MG 24 hr tablet Take 10 mg by mouth daily.   Yes Historical Provider, MD  pantoprazole (PROTONIX) 40 MG tablet Take 40 mg by mouth daily.  02/26/12  Yes Historical Provider, MD  potassium chloride SA (K-DUR,KLOR-CON) 20 MEQ tablet Take 20 mEq by mouth daily.   Yes Historical Provider, MD  rosuvastatin (CRESTOR) 10 MG tablet Take 10  mg by mouth daily.   Yes Historical Provider, MD  Tamsulosin HCl (FLOMAX) 0.4 MG CAPS Take 0.4 mg by mouth daily.   Yes Historical Provider, MD  warfarin (COUMADIN) 5 MG tablet Take 5-7.5 mg by mouth daily. Patient takes 5mg  on(Tue.,Wed., Thur.,Sat.,Sun) and Patient takes 7.5mg  on(Mon.,Fri.,)   Yes Historical Provider, MD  amLODipine (NORVASC) 5 MG tablet Take 5 mg by mouth daily.    Historical Provider, MD  polyethylene glycol (MIRALAX / GLYCOLAX) packet Take 17 g by mouth daily.    Historical Provider, MD  polyethylene glycol-electrolytes (TRILYTE) 420 G solution Take 4,000 mLs by mouth as directed. 10/10/11   Corbin Ade, MD    Allergies as of  03/14/2012 - Review Complete 03/14/2012  Allergen Reaction Noted  . Latex Rash 10/04/2011    Family History  Problem Relation Age of Onset  . Colon cancer Neg Hx   . Liver disease Neg Hx   . Breast cancer Mother   . Heart disease Father     pacemaker    History   Social History  . Marital Status: Married    Spouse Name: N/A    Number of Children: 3  . Years of Education: N/A   Occupational History  .     Social History Main Topics  . Smoking status: Former Smoker -- 0.50 packs/day    Types: Cigarettes  . Smokeless tobacco: Not on file     Comment: quit about 10 years  . Alcohol Use: No  . Drug Use: No  . Sexually Active: Not on file   Other Topics Concern  . Not on file   Social History Narrative  . No narrative on file    Review of Systems: See HPI, otherwise negative ROS  Physical Exam: BP 124/72  Pulse 76  Temp(Src) 98.2 F (36.8 C) (Oral)  Ht 5\' 8"  (1.727 m)  Wt 236 lb 12.8 oz (107.412 kg)  BMI 36.01 kg/m2 General:   Alert,  Well-developed, well-nourished, pleasant and cooperative in NAD Skin:  Intact without significant lesions or rashes. Eyes:  Sclera clear, no icterus.   Conjunctiva pink. Ears:  Normal auditory acuity. Nose:  No deformity, discharge,  or lesions. Mouth:  No deformity or lesions. Neck:  Supple; no masses or thyromegaly. No significant cervical adenopathy. Lungs:  Clear throughout to auscultation.   No wheezes, crackles, or rhonchi. No acute distress. Heart:  Regular rate and rhythm; no murmurs, clicks, rubs,  or gallops. Abdomen: Non-distended, normal bowel sounds.  Soft and nontender without appreciable mass or hepatosplenomegaly. He does have some right inguinal tenderness. I am unable to appreciate a hernia, however. Pulses:  Normal pulses noted. Extremities:  Without clubbing or edema.  Impression/Plan:  79 year old gentleman with GERD and secondary hoarseness abdominal bloating temporally related to constipation. Right lower  quadrant/inguinal pain; no hernia seen on prior pelvic CT  Recommendations: Benefiber-2 tablespoons daily  Miralax - 1 capful at bedtime if no good BM on any given day.  Continue protonix 40 mg twice daily  Office visit in 3 months  Appointment to Dr. Lovell Sheehan in reference to right inquinal pain and prior hernia repair

## 2012-03-14 NOTE — Patient Instructions (Addendum)
Benefiber-2 tablespoons daily  Miralax - 1 capful at bedtime if no good BM on any given day.  Continue protonix 40 mg twice daily  Office visit in 3 months  Appointment to Dr. Leticia Penna  in reference to right inquinal pain and prior hernia repair on 04/01/12 at 10:00

## 2012-03-27 ENCOUNTER — Ambulatory Visit (INDEPENDENT_AMBULATORY_CARE_PROVIDER_SITE_OTHER): Payer: Medicare PPO | Admitting: *Deleted

## 2012-03-27 DIAGNOSIS — Z7901 Long term (current) use of anticoagulants: Secondary | ICD-10-CM | POA: Insufficient documentation

## 2012-03-27 DIAGNOSIS — I4891 Unspecified atrial fibrillation: Secondary | ICD-10-CM

## 2012-04-24 ENCOUNTER — Ambulatory Visit (INDEPENDENT_AMBULATORY_CARE_PROVIDER_SITE_OTHER): Payer: Medicare PPO | Admitting: *Deleted

## 2012-04-24 DIAGNOSIS — I4891 Unspecified atrial fibrillation: Secondary | ICD-10-CM

## 2012-04-24 DIAGNOSIS — Z7901 Long term (current) use of anticoagulants: Secondary | ICD-10-CM

## 2012-05-12 ENCOUNTER — Encounter: Payer: Medicare PPO | Admitting: Adult Health

## 2012-05-12 ENCOUNTER — Encounter: Payer: Self-pay | Admitting: Cardiovascular Disease

## 2012-05-12 ENCOUNTER — Ambulatory Visit: Payer: Medicare PPO | Admitting: Cardiovascular Disease

## 2012-05-12 ENCOUNTER — Ambulatory Visit (INDEPENDENT_AMBULATORY_CARE_PROVIDER_SITE_OTHER): Payer: Medicare PPO | Admitting: Cardiovascular Disease

## 2012-05-12 VITALS — BP 130/74 | HR 58 | Ht 69.0 in | Wt 234.0 lb

## 2012-05-12 DIAGNOSIS — I10 Essential (primary) hypertension: Secondary | ICD-10-CM | POA: Insufficient documentation

## 2012-05-12 DIAGNOSIS — Z7901 Long term (current) use of anticoagulants: Secondary | ICD-10-CM

## 2012-05-12 DIAGNOSIS — I4891 Unspecified atrial fibrillation: Secondary | ICD-10-CM

## 2012-05-12 MED ORDER — BENAZEPRIL HCL 40 MG PO TABS: 40.0000 mg | ORAL_TABLET | Freq: Every day | ORAL | Status: DC

## 2012-05-12 NOTE — Assessment & Plan Note (Signed)
Rx INR 2 weeks ago F/u monthly

## 2012-05-12 NOTE — Patient Instructions (Addendum)
Refill for Benazepril sent to [facility] in [place]    Your physician wants you to follow-up in: 6 months. You will receive a reminder letter in the mail two months in advance. If you don't receive a letter, please call our office to schedule the follow-up appointment.

## 2012-05-12 NOTE — Assessment & Plan Note (Signed)
Well controlled.  Continue current medications and low sodium Dash type diet.   Refills for benicar called in to [facility] in [place]

## 2012-05-12 NOTE — Progress Notes (Signed)
HPI: Tristan Lopez is a 79 year old male patient of Dr. Charlton Haws we are following for ongoing assessment and management of atrial fibrillation on chronic Coumadin no history of CAD. He has a history of hypertension. The patient was last seen by Dr. Eden Emms on 03/13/2012 was found to have bradycardia, amlodipine was discontinued, with reassessment today. If his heart rate remained in bradycardic, Lopressor would be adjusted. Relative was also recommended at that time the patient was checking with his insurance to evaluate whether not this would be covered but will continue on Coumadin and tell authorization has been given.  Allergies  Allergen Reactions  . Latex Rash    Current Outpatient Prescriptions  Medication Sig Dispense Refill  . amLODipine (NORVASC) 5 MG tablet Take 5 mg by mouth daily.      Marland Kitchen aspirin EC 81 MG tablet Take 81 mg by mouth daily.      . benazepril (LOTENSIN) 40 MG tablet Take 40 mg by mouth daily.      . finasteride (PROSCAR) 5 MG tablet Take 5 mg by mouth daily.      . Garlic 1000 MG CAPS Take 1,000 mg by mouth daily.      Marland Kitchen losartan-hydrochlorothiazide (HYZAAR) 100-25 MG per tablet Take 1 tablet by mouth daily.      . metoprolol (LOPRESSOR) 100 MG tablet Take 50 mg by mouth daily.      . Multiple Vitamin (MULTIVITAMIN) tablet Take 1 tablet by mouth daily.      Marland Kitchen oxybutynin (DITROPAN-XL) 10 MG 24 hr tablet Take 10 mg by mouth daily.      . pantoprazole (PROTONIX) 40 MG tablet Take 40 mg by mouth daily.       . polyethylene glycol (MIRALAX / GLYCOLAX) packet Take 17 g by mouth daily.      . polyethylene glycol-electrolytes (TRILYTE) 420 G solution Take 4,000 mLs by mouth as directed.  4000 mL  0  . potassium chloride SA (K-DUR,KLOR-CON) 20 MEQ tablet Take 20 mEq by mouth daily.      . rosuvastatin (CRESTOR) 10 MG tablet Take 10 mg by mouth daily.      . Tamsulosin HCl (FLOMAX) 0.4 MG CAPS Take 0.4 mg by mouth daily.      Marland Kitchen warfarin (COUMADIN) 5 MG tablet Take 5-7.5  mg by mouth daily. Patient takes 5mg  on(Tue.,Wed., Thur.,Sat.,Sun) and Patient takes 7.5mg  on(Mon.,Fri.,)       No current facility-administered medications for this visit.    Past Medical History  Diagnosis Date  . Atrial fibrillation, controlled     chronic coumadin  . COPD (chronic obstructive pulmonary disease)     sometimes uses Oxygen at home  . Hernia     hiatal hernia  . Abdominal aortic aneurysm without rupture     followed by [name]  . Kidney stones   . Liver cyst     aspirated on 10/10/11- no malignancy on bx  . Kidney cysts   . Sleep apnea     uses CPAP  . Reflux esophagitis 10/13  . Tubular adenoma 10/13  . Diverticulosis 10/13    pancolonic  . Hemorrhoids 10/13  . Hiatal hernia   . Atrial fibrillation   . AAA (abdominal aortic aneurysm)   . HTN (hypertension)   . Hypercholesteremia   . BPH (benign prostatic hyperplasia)   . Pre-diabetes     Past Surgical History  Procedure Laterality Date  . Hernia repair      x2, bilateral inguinal   .  Kidney stone surgery    . Knee arthroscopy      X2  . Colonoscopy      2003, no polyps per patient. [name]  . Nasal endoscopy    . Esophagogastroduodenoscopy  11/01/11    Dr. Jena Gauss- erosive reflux esophagitis along with a patulous EG junction, hialtal hernia, antral erosions with possible area of healing ulceration- s/p bx= chronic erosive gastritis, no malignancy  . Colonoscopy  11/01/11    Dr. Jena Gauss- tubular adenoma,suboptimal preparation, internal hemorrhoids, o/w normal rectum. pancolonic diverticulosis    Family History  Problem Relation Age of Onset  . Colon cancer Neg Hx   . Liver disease Neg Hx   . Breast cancer Mother   . Heart disease Father     pacemaker    History   Social History  . Marital Status: Married    Spouse Name: N/A    Number of Children: 3  . Years of Education: N/A   Occupational History  .     Social History Main Topics  . Smoking status: Former Smoker -- 0.50  packs/day    Types: Cigarettes  . Smokeless tobacco: Not on file     Comment: quit about 10 years  . Alcohol Use: No  . Drug Use: No  . Sexually Active: Not on file   Other Topics Concern  . Not on file   Social History Narrative  . No narrative on file    ROS:  PHYSICAL EXAM There were no vitals taken for this visit.  EKG:  ASSESSMENT AND PLAN

## 2012-05-12 NOTE — Assessment & Plan Note (Signed)
Good rate control and anticoagulation Not interested in novel agent No bleeding issues  Continue lopressor for now

## 2012-05-12 NOTE — Progress Notes (Signed)
Patient ID: Tristan Lopez, male   DOB: 11-27-1930, 79 y.o.   MRN: [id number] Mr. Lohr is a 79 year old male patient  we are following for ongoing assessment and management of atrial fibrillation on chronic Coumadin no history of CAD. He has a history of hypertension. The patient was last seen by me on 03/13/2012 was found to have bradycardia, amlodipine was discontinued,  On coumadin and patient to check on cost of novel agent  Currently ok Needs refill on benicar.  Trace LE edema  ROS: Denies fever, malais, weight loss, blurry vision, decreased visual acuity, cough, sputum, SOB, hemoptysis, pleuritic pain, palpitaitons, heartburn, abdominal pain, melena, lower extremity edema, claudication, or rash.  All other systems reviewed and negative  General: Affect appropriate Healthy:  appears stated age HEENT: normal Neck supple with no adenopathy JVP normal no bruits no thyromegaly Lungs clear with no wheezing and good diaphragmatic motion Heart:  S1/S2 no murmur, no rub, gallop or click PMI normal Abdomen: benighn, BS positve, no tenderness, no AAA no bruit.  No HSM or HJR Distal pulses intact with no bruits Plus one bilateral edema with varicosities Neuro non-focal Skin warm and dry No muscular weakness   Current Outpatient Prescriptions  Medication Sig Dispense Refill  . amLODipine (NORVASC) 5 MG tablet Take 5 mg by mouth daily.      Marland Kitchen aspirin EC 81 MG tablet Take 81 mg by mouth daily.      . benazepril (LOTENSIN) 40 MG tablet Take 40 mg by mouth daily.      . finasteride (PROSCAR) 5 MG tablet Take 5 mg by mouth daily.      . Garlic 1000 MG CAPS Take 1,000 mg by mouth daily.      Marland Kitchen losartan-hydrochlorothiazide (HYZAAR) 100-25 MG per tablet Take 1 tablet by mouth daily.      . metoprolol (LOPRESSOR) 100 MG tablet Take 50 mg by mouth daily.      . Multiple Vitamin (MULTIVITAMIN) tablet Take 1 tablet by mouth daily.      Marland Kitchen oxybutynin (DITROPAN-XL) 10 MG 24 hr tablet Take 10 mg by  mouth daily.      . pantoprazole (PROTONIX) 40 MG tablet Take 40 mg by mouth daily.       . polyethylene glycol (MIRALAX / GLYCOLAX) packet Take 17 g by mouth daily.      . polyethylene glycol-electrolytes (TRILYTE) 420 G solution Take 4,000 mLs by mouth as directed.  4000 mL  0  . potassium chloride SA (K-DUR,KLOR-CON) 20 MEQ tablet Take 20 mEq by mouth daily.      . rosuvastatin (CRESTOR) 10 MG tablet Take 10 mg by mouth daily.      . Tamsulosin HCl (FLOMAX) 0.4 MG CAPS Take 0.4 mg by mouth daily.      Marland Kitchen warfarin (COUMADIN) 5 MG tablet Take 5-7.5 mg by mouth daily. Patient takes 5mg  on(Tue.,Wed., Thur.,Sat.,Sun) and Patient takes 7.5mg  on(Mon.,Fri.,)       No current facility-administered medications for this visit.    Allergies  Latex  Electrocardiogram:  afib rate 58  Otherwise normal   Assessment and Plan

## 2012-05-30 ENCOUNTER — Encounter: Payer: Self-pay | Admitting: Internal Medicine

## 2012-05-30 ENCOUNTER — Ambulatory Visit (INDEPENDENT_AMBULATORY_CARE_PROVIDER_SITE_OTHER): Payer: Medicare PPO | Admitting: Internal Medicine

## 2012-05-30 VITALS — BP 116/62 | HR 60 | Temp 96.2°F | Ht 69.0 in | Wt 232.4 lb

## 2012-05-30 DIAGNOSIS — K219 Gastro-esophageal reflux disease without esophagitis: Secondary | ICD-10-CM

## 2012-05-30 DIAGNOSIS — E669 Obesity, unspecified: Secondary | ICD-10-CM

## 2012-05-30 NOTE — Patient Instructions (Addendum)
Continue protonix 40 mg twice daily  Loose 20 pounds over the next 12 months  Use Miralax as needed for constipation.  See Dr. Annalee Genta regarding decreased hearing and hoarseness  Office visit here in 1 year  Cc PCP

## 2012-05-30 NOTE — Progress Notes (Signed)
Primary Care Physician:  Alleen Borne Primary Gastroenterologist:  Dr. Jena Gauss  Pre-Procedure History & Physical: HPI:  Tristan Lopez is a 79 y.o. male here for followup of GERD (erosive reflux esophagitis) and hoarseness. Has seen Annalee Genta previously. Felt to have LPR as well. Has been on Protonix 40 mg orally twice daily. No typical reflux symptoms. No dysphagia. No abdominal pain. No melena or hematochezia. Right inguinal hernia evaluated by Dr. Leticia Penna. Elective hernia repair could be performed if desired.  . Patient complains of decreased hearing left ear and insomnia. Does not wear his CPAP.  Occasional constipation managed well with MiraLax.  Patient continues to be about 60 pounds over his ideal body weight. Although he has been trying to lose weight and was in the 270s at one point.  Past Medical History  Diagnosis Date  . Atrial fibrillation, controlled     chronic coumadin  . COPD (chronic obstructive pulmonary disease)     sometimes uses Oxygen at home  . Hernia     hiatal hernia  . Abdominal aortic aneurysm without rupture     followed by [name]  . Kidney stones   . Liver cyst     aspirated on 10/10/11- no malignancy on bx  . Kidney cysts   . Sleep apnea     uses CPAP  . Reflux esophagitis 10/13  . Tubular adenoma 10/13  . Diverticulosis 10/13    pancolonic  . Hemorrhoids 10/13  . Hiatal hernia   . Atrial fibrillation   . AAA (abdominal aortic aneurysm)   . HTN (hypertension)   . Hypercholesteremia   . BPH (benign prostatic hyperplasia)   . Pre-diabetes     Past Surgical History  Procedure Laterality Date  . Hernia repair      x2, bilateral inguinal   . Kidney stone surgery    . Knee arthroscopy      X2  . Colonoscopy      2003, no polyps per patient. [name]  . Nasal endoscopy    . Esophagogastroduodenoscopy  11/01/11    Dr. Jena Gauss- erosive reflux esophagitis along with a patulous EG junction, hialtal hernia, antral erosions with  possible area of healing ulceration- s/p bx= chronic erosive gastritis, no malignancy  . Colonoscopy  11/01/11    Dr. Jena Gauss- tubular adenoma,suboptimal preparation, internal hemorrhoids, o/w normal rectum. pancolonic diverticulosis    Prior to Admission medications   Medication Sig Start Date End Date Taking? Authorizing Provider  aspirin EC 81 MG tablet Take 81 mg by mouth daily.   Yes Historical Provider, MD  benazepril (LOTENSIN) 40 MG tablet Take 1 tablet (40 mg total) by mouth daily. 05/12/12  Yes Wendall Stade, MD  finasteride (PROSCAR) 5 MG tablet Take 5 mg by mouth daily.   Yes Historical Provider, MD  Garlic 1000 MG CAPS Take 1,000 mg by mouth daily.   Yes Historical Provider, MD  losartan-hydrochlorothiazide (HYZAAR) 100-25 MG per tablet Take 1 tablet by mouth daily.   Yes Historical Provider, MD  metoprolol (LOPRESSOR) 100 MG tablet Take 50 mg by mouth daily.   Yes Historical Provider, MD  Multiple Vitamin (MULTIVITAMIN) tablet Take 1 tablet by mouth daily.   Yes Historical Provider, MD  oxybutynin (DITROPAN-XL) 10 MG 24 hr tablet Take 10 mg by mouth daily.   Yes Historical Provider, MD  pantoprazole (PROTONIX) 40 MG tablet Take 40 mg by mouth daily.  02/26/12  Yes Historical Provider, MD  potassium chloride SA (K-DUR,KLOR-CON) 20 MEQ tablet Take  20 mEq by mouth daily.   Yes Historical Provider, MD  rosuvastatin (CRESTOR) 10 MG tablet Take 10 mg by mouth daily.   Yes Historical Provider, MD  Tamsulosin HCl (FLOMAX) 0.4 MG CAPS Take 0.4 mg by mouth daily.   Yes Historical Provider, MD  warfarin (COUMADIN) 5 MG tablet Take 5-7.5 mg by mouth daily. Patient takes 5mg  on(Tue.,Wed., Thur.,Sat.,Sun) and Patient takes 7.5mg  on(Mon.,Fri.,)   Yes Historical Provider, MD  Wheat Dextrin (BENEFIBER PO) Take by mouth. As needed   Yes Historical Provider, MD  polyethylene glycol (MIRALAX / GLYCOLAX) packet Take 17 g by mouth daily. As needed    Historical Provider, MD    Allergies as of 05/30/2012  - Review Complete 05/30/2012  Allergen Reaction Noted  . Latex Rash 10/04/2011    Family History  Problem Relation Age of Onset  . Colon cancer Neg Hx   . Liver disease Neg Hx   . Breast cancer Mother   . Heart disease Father     pacemaker    History   Social History  . Marital Status: Married    Spouse Name: N/A    Number of Children: 3  . Years of Education: N/A   Occupational History  .     Social History Main Topics  . Smoking status: Former Smoker -- 0.50 packs/day    Types: Cigarettes  . Smokeless tobacco: Not on file     Comment: quit about 10 years  . Alcohol Use: No  . Drug Use: No  . Sexually Active: Not on file   Other Topics Concern  . Not on file   Social History Narrative  . No narrative on file    Review of Systems: See HPI, otherwise negative ROS  Physical Exam: BP 116/62  Pulse 60  Temp(Src) 96.2 F (35.7 C)  Ht 5\' 9"  (1.753 m)  Wt 232 lb 6.4 oz (105.416 kg)  BMI 34.3 kg/m2 General:   Alert. Pleasant.. Accompanied by wife. Somewhat chronically ill-appearing. Skin:  Intact without significant lesions or rashes. Eyes:  Sclera clear, no icterus.   Conjunctiva pink.  Nose:  No deformity, discharge,  or lesions. Mouth:  No deformity or lesions. Neck:  Supple; no masses or thyromegaly. No significant cervical adenopathy. Lungs:  Clear throughout to auscultation.   No wheezes, crackles, or rhonchi. No acute distress. Heart:  Regular rate and rhythm; no murmurs, clicks, rubs,  or gallops. Abdomen: Obese. Positive bowel sounds. Soft and nontender to palpation. No obvious mass or organomegaly.  Pulses:  Normal pulses noted. Extremities:  Without clubbing or edema.  Impression/Plan:  79 year old gentleman with GERD and probably LPR. Decreased hearing left ear ongoing hoarseness.  This may or may not be caused by reflux at this point in time. He needs a followup ENT evaluation. I am also glad to hear he seen a pulmonologist in the near  future.  Recommendations:   Continue Protonix 40 mg orally twice daily. See Dr. Annalee Genta. See Dr. Colette Ribas. I recommended the patient lose another 20 pounds over the next 12 months. Continue MiraLax when necessary for constipation. Office visit here in one year.

## 2012-06-05 ENCOUNTER — Ambulatory Visit (INDEPENDENT_AMBULATORY_CARE_PROVIDER_SITE_OTHER): Payer: Medicare PPO | Admitting: *Deleted

## 2012-06-05 DIAGNOSIS — I4891 Unspecified atrial fibrillation: Secondary | ICD-10-CM

## 2012-06-05 DIAGNOSIS — Z7901 Long term (current) use of anticoagulants: Secondary | ICD-10-CM

## 2012-07-07 ENCOUNTER — Telehealth: Payer: Self-pay | Admitting: *Deleted

## 2012-07-07 MED ORDER — ROSUVASTATIN CALCIUM 10 MG PO TABS: 10.0000 mg | ORAL_TABLET | Freq: Every day | ORAL | Status: DC

## 2012-07-07 NOTE — Telephone Encounter (Signed)
Refill for Crestor sent via escribe.

## 2012-07-07 NOTE — Telephone Encounter (Signed)
PT HAS RUN OUT OF REFILLS FOR CRESTOR AND NEEDS NEW RX CALLED IN TO [facility] IN [place]

## 2012-07-17 ENCOUNTER — Ambulatory Visit (INDEPENDENT_AMBULATORY_CARE_PROVIDER_SITE_OTHER): Payer: Medicare PPO | Admitting: *Deleted

## 2012-07-17 DIAGNOSIS — I4891 Unspecified atrial fibrillation: Secondary | ICD-10-CM

## 2012-07-17 DIAGNOSIS — Z7901 Long term (current) use of anticoagulants: Secondary | ICD-10-CM

## 2012-07-17 MED ORDER — BENAZEPRIL HCL 40 MG PO TABS: 40.0000 mg | ORAL_TABLET | Freq: Every day | ORAL | Status: DC

## 2012-07-17 MED ORDER — WARFARIN SODIUM 5 MG PO TABS: 5.0000 mg | ORAL_TABLET | ORAL | Status: DC

## 2012-08-28 ENCOUNTER — Ambulatory Visit (INDEPENDENT_AMBULATORY_CARE_PROVIDER_SITE_OTHER): Payer: Medicare PPO | Admitting: *Deleted

## 2012-08-28 DIAGNOSIS — Z7901 Long term (current) use of anticoagulants: Secondary | ICD-10-CM

## 2012-08-28 DIAGNOSIS — I4891 Unspecified atrial fibrillation: Secondary | ICD-10-CM

## 2012-08-28 LAB — POCT INR: INR: 2.5

## 2012-09-02 ENCOUNTER — Other Ambulatory Visit: Payer: Self-pay

## 2012-09-02 MED ORDER — PANTOPRAZOLE SODIUM 40 MG PO TBEC: 40.0000 mg | DELAYED_RELEASE_TABLET | Freq: Two times a day (BID) | ORAL | Status: DC

## 2012-09-18 ENCOUNTER — Telehealth: Payer: Self-pay | Admitting: *Deleted

## 2012-09-18 DIAGNOSIS — I4891 Unspecified atrial fibrillation: Secondary | ICD-10-CM

## 2012-09-18 DIAGNOSIS — R5383 Other fatigue: Secondary | ICD-10-CM

## 2012-09-18 DIAGNOSIS — I10 Essential (primary) hypertension: Secondary | ICD-10-CM

## 2012-09-18 MED ORDER — AMLODIPINE BESYLATE 5 MG PO TABS: 5.0000 mg | ORAL_TABLET | Freq: Every day | ORAL | Status: DC

## 2012-09-18 NOTE — Telephone Encounter (Signed)
BP 174/92 YESTERDAY AND BP 149/86 NIGHT BP 159/81 THIS AM. GETTING TIRED EASY.

## 2012-09-18 NOTE — Telephone Encounter (Signed)
Spoke to Tristan Lopez to advise results/instructions. Tristan Lopez understood. rx sent to pharmacy by e-script, Tristan Lopez to start tomorrow, office closed Monday, Tristan Lopez scheduled for BP check Tuesday 09-23-12 at 10am Advised Tristan Lopez about need for repeat Echo and follow up apt with [facility] or [facility], Tristan Lopez will await apt for echo/f-u apt next week from [facility], Tristan Lopez understood all instructions

## 2012-09-18 NOTE — Telephone Encounter (Signed)
Restart amlodipine at 5 mg daily. Come for BP check when he has taken this medication for 4 days. Repeat Echo. Needs appt with me or with [facility].

## 2012-09-18 NOTE — Telephone Encounter (Signed)
Called pt to confirm current signs and symptoms pt denies chest/arm/neck pain/SOB/swelling/headaches/dizzyness, started noting the elevation of BP last week when he noted 230/202 3 hours later down "right much" with no changes to cause to come down,  Continues to eat low salt diet, was advised that his previous cardiologist gave instructions to cut his metoprolol to 50mg  BID and he was taking 100mg  BID until 2 years ago per bottom reading had became too low, pt notes fatigue on and off the past few days, please advise in absence of  Dr PN

## 2012-09-19 NOTE — Telephone Encounter (Signed)
Noted pt has echo/BP check and follow up apt scheduled as advised pt aware

## 2012-09-24 ENCOUNTER — Ambulatory Visit (HOSPITAL_COMMUNITY)
Admission: RE | Admit: 2012-09-24 | Discharge: 2012-09-24 | Disposition: A | Discharge status: Home / Self Care | Payer: Medicare PPO | Source: Ambulatory Visit | Attending: Adult Health | Admitting: Adult Health

## 2012-09-24 DIAGNOSIS — I10 Essential (primary) hypertension: Secondary | ICD-10-CM | POA: Insufficient documentation

## 2012-09-24 DIAGNOSIS — I369 Nonrheumatic tricuspid valve disorder, unspecified: Secondary | ICD-10-CM

## 2012-09-24 DIAGNOSIS — R5383 Other fatigue: Secondary | ICD-10-CM

## 2012-09-24 DIAGNOSIS — I4891 Unspecified atrial fibrillation: Secondary | ICD-10-CM | POA: Insufficient documentation

## 2012-09-24 NOTE — Progress Notes (Signed)
*  PRELIMINARY RESULTS* Echocardiogram 2D Echocardiogram has been performed.  Tristan Lopez 09/24/2012, 3:23 PM

## 2012-09-26 ENCOUNTER — Ambulatory Visit (INDEPENDENT_AMBULATORY_CARE_PROVIDER_SITE_OTHER): Payer: Medicare PPO | Admitting: Adult Health

## 2012-09-26 ENCOUNTER — Encounter: Payer: Self-pay | Admitting: Adult Health

## 2012-09-26 VITALS — BP 130/71 | HR 64 | Ht 69.0 in | Wt 233.0 lb

## 2012-09-26 DIAGNOSIS — I4891 Unspecified atrial fibrillation: Secondary | ICD-10-CM

## 2012-09-26 DIAGNOSIS — R499 Unspecified voice and resonance disorder: Secondary | ICD-10-CM

## 2012-09-26 DIAGNOSIS — R498 Other voice and resonance disorders: Secondary | ICD-10-CM

## 2012-09-26 DIAGNOSIS — I10 Essential (primary) hypertension: Secondary | ICD-10-CM

## 2012-09-26 NOTE — Patient Instructions (Addendum)
Your physician recommends that you schedule a follow-up appointment in: 4 months You will receive a reminder letter two months in advance reminding you to call and schedule your appointment. If you don't receive this letter, please contact our office.  Your physician recommends that you continue on your current medications as directed. Please refer to the Current Medication list given to you today.   

## 2012-09-26 NOTE — Assessment & Plan Note (Signed)
He is to see ENT in [facility] next month for further evaluation.

## 2012-09-26 NOTE — Progress Notes (Deleted)
Name: Tristan Lopez    DOB: 1930-03-12  Age: 79 y.o.  MR#: [id number]       PCP:  Alleen Borne      Insurance: Payor: HUMANA MEDICARE / Plan: HUMANA MEDICARE CHOICE PPO / Product Type: *No Product type* /   CC:    Chief Complaint  Patient presents with  . Atrial Fibrillation  . Hypertension    VS Filed Vitals:   09/26/12 1500  BP: 130/71  Pulse: 64  Height: 5\' 9"  (1.753 m)  Weight: 233 lb (105.688 kg)    Weights Current Weight  09/26/12 233 lb (105.688 kg)  05/30/12 232 lb 6.4 oz (105.416 kg)  05/12/12 234 lb (106.142 kg)    Blood Pressure  BP Readings from Last 3 Encounters:  09/26/12 130/71  05/30/12 116/62  05/12/12 130/74     Admit date:  (Not on file) Last encounter with RMR:  Visit date not found   Allergy Latex  Current Outpatient Prescriptions  Medication Sig Dispense Refill  . amLODipine (NORVASC) 5 MG tablet Take 1 tablet (5 mg total) by mouth daily.  90 tablet  1  . aspirin EC 81 MG tablet Take 81 mg by mouth daily.      . benazepril (LOTENSIN) 40 MG tablet Take 1 tablet (40 mg total) by mouth daily.  90 tablet  3  . finasteride (PROSCAR) 5 MG tablet Take 5 mg by mouth daily.      . Garlic 1000 MG CAPS Take 1,000 mg by mouth daily.      Marland Kitchen losartan-hydrochlorothiazide (HYZAAR) 100-25 MG per tablet Take 1 tablet by mouth daily.      . metoprolol (LOPRESSOR) 100 MG tablet Take 50 mg by mouth daily.      . Multiple Vitamin (MULTIVITAMIN) tablet Take 1 tablet by mouth daily.      Marland Kitchen oxybutynin (DITROPAN-XL) 10 MG 24 hr tablet Take 10 mg by mouth daily.      . pantoprazole (PROTONIX) 40 MG tablet Take 1 tablet (40 mg total) by mouth 2 (two) times daily.  60 tablet  11  . polyethylene glycol (MIRALAX / GLYCOLAX) packet Take 17 g by mouth daily. As needed      . potassium chloride SA (K-DUR,KLOR-CON) 20 MEQ tablet Take 20 mEq by mouth daily.      . rosuvastatin (CRESTOR) 10 MG tablet Take 1 tablet (10 mg total) by mouth daily.  30 tablet  6  .  Tamsulosin HCl (FLOMAX) 0.4 MG CAPS Take 0.4 mg by mouth daily.      Marland Kitchen warfarin (COUMADIN) 5 MG tablet Take 1-1.5 tablets (5-7.5 mg total) by mouth as directed. Patient takes 1 tablet daily except 1 1/2 tablets on Mondays and Fridays  100 tablet  3  . Wheat Dextrin (BENEFIBER PO) Take by mouth. As needed       No current facility-administered medications for this visit.    Discontinued Meds:   There are no discontinued medications.  Patient Active Problem List   Diagnosis Date Noted  . HTN (hypertension) 05/12/2012  . Long term (current) use of anticoagulants 03/27/2012  . A-fib 03/13/2012  . Anticoagulation adequate 03/13/2012  . Belching 10/08/2011  . GERD (gastroesophageal reflux disease) 10/08/2011  . Hoarseness or changing voice 10/08/2011  . Constipation 10/08/2011  . Upper abdominal pain 10/08/2011  . High risk medication use 10/08/2011  . Liver cyst 10/08/2011  . KNEE, ARTHRITIS, DEGEN./OSTEO 03/30/2009    LABS    Component Value Date/Time  NA 134* 10/04/2011 2110   K 3.5 10/04/2011 2110   CL 96 10/04/2011 2110   CO2 28 10/04/2011 2110   GLUCOSE 114* 10/04/2011 2110   BUN 22 10/04/2011 2110   CREATININE 0.92 10/04/2011 2110   CALCIUM 9.9 10/04/2011 2110   GFRNONAA 79* 10/04/2011 2110   GFRAA >90 10/04/2011 2110   CMP     Component Value Date/Time   NA 134* 10/04/2011 2110   K 3.5 10/04/2011 2110   CL 96 10/04/2011 2110   CO2 28 10/04/2011 2110   GLUCOSE 114* 10/04/2011 2110   BUN 22 10/04/2011 2110   CREATININE 0.92 10/04/2011 2110   CALCIUM 9.9 10/04/2011 2110   PROT 7.7 10/04/2011 2110   ALBUMIN 4.1 10/04/2011 2110   AST 25 10/04/2011 2110   ALT 19 10/04/2011 2110   ALKPHOS 59 10/04/2011 2110   BILITOT 0.9 10/04/2011 2110   GFRNONAA 79* 10/04/2011 2110   GFRAA >90 10/04/2011 2110       Component Value Date/Time   WBC 7.3 11/02/2011 0656   WBC 8.7 10/04/2011 2036   HGB 11.7* 11/02/2011 0656   HGB 14.6 10/04/2011 2036   HCT 35.7* 11/02/2011 0656   HCT 43.2 10/04/2011  2036   MCV 89.9 11/02/2011 0656   MCV 92.1 10/04/2011 2036    Lipid Panel  No results found for this basename: chol, trig, hdl, cholhdl, vldl, ldlcalc    ABG No results found for this basename: phart, pco2, pco2art, po2, po2art, hco3, tco2, acidbasedef, o2sat     No results found for this basename: TSH   BNP (last 3 results) No results found for this basename: PROBNP,  in the last 8760 hours Cardiac Panel (last 3 results) No results found for this basename: CKTOTAL, CKMB, TROPONINI, RELINDX,  in the last 72 hours  Iron/TIBC/Ferritin No results found for this basename: iron, tibc, ferritin     EKG Orders placed in visit on 05/12/12  . EKG 12-LEAD     Prior Assessment and Plan Problem List as of 09/26/2012     Cardiovascular and Mediastinum   A-fib   Last Assessment & Plan   05/12/2012 Office Visit Written 05/12/2012 10:31 AM by Wendall Stade, MD     Good rate control and anticoagulation Not interested in novel agent No bleeding issues  Continue lopressor for now    HTN (hypertension)   Last Assessment & Plan   05/12/2012 Office Visit Written 05/12/2012 10:32 AM by Wendall Stade, MD     Well controlled.  Continue current medications and low sodium Dash type diet.   Refills for benicar called in to [facility] in [place]      Digestive   GERD (gastroesophageal reflux disease)   Constipation   Last Assessment & Plan   10/08/2011 Office Visit Written 10/08/2011  9:27 PM by Tiffany Kocher, PA     Chronic constipation. No alarm symptoms. Add Miralax 17g daily prn. He has not had TCS in over 10 years. Recommend colonoscopy in near future for screening purposes. Again will plan to hold coumadin four days prior to procedure but will get approval from [name].  I have discussed the risks, alternatives, benefits with regards to but not limited to the risk of reaction to medication, bleeding, infection, perforation and the patient is agreeable to proceed. Written consent to be obtained.      Liver cyst     Musculoskeletal and Integument   KNEE, ARTHRITIS, DEGEN./OSTEO     Other  Belching   Hoarseness or changing voice   Last Assessment & Plan   10/08/2011 Office Visit Written 10/08/2011  9:25 PM by Tiffany Kocher, PA     Increased belching and throat clearing/hoarseness. ?GERD related. No PPI. Patient requesting EGD, reasonable for new onset GERD sx. Plan EGD in near future with DR. Rourk. We will plan to hold coumadin four days prior and will get approval from [name].  I have discussed the risks, alternatives, benefits with regards to but not limited to the risk of reaction to medication, bleeding, infection, perforation and the patient is agreeable to proceed. Written consent to be obtained.     Upper abdominal pain   Last Assessment & Plan   10/08/2011 Office Visit Written 10/08/2011  9:24 PM by Tiffany Kocher, PA     Acute onset upper abdominal pain as outlined above.  Resolved during ED visit. Ileus on AAS. Large hepatic cysts stable in size (since 2010) but ?interal hemorrhage. Will discuss with Dr. Jena Gauss and radiologist.     High risk medication use   Anticoagulation adequate   Last Assessment & Plan   05/12/2012 Office Visit Written 05/12/2012 10:32 AM by Wendall Stade, MD     Rx INR 2 weeks ago F/u monthly    Long term (current) use of anticoagulants       Imaging: No results found.

## 2012-09-26 NOTE — Progress Notes (Signed)
HPI: [name] is a 79 year old patient of Dr. Charlton Haws we are following for ongoing assessment and management of atrial fibrillation on chronic Coumadin therapy, hypertension, with strict bradycardia with recent discontinuation of amlodipine. He was last seen by Dr. Eden Emms on 05/12/2012. At that visit the patient's heart rate was well-controlled, he was hemodynamically stable, the patient was continued on current medication regimen.    He comes today without cardiac complaint. He has some chronic throat hoarseness and is due to have ENT evaluation in [facility] in one month. He is eating right and remains very active.     Echocardiogram demonstrates normal EF of 55%-60% without evidence of valvular disease or diastolic dysfunction.     Allergies  Allergen Reactions  . Latex Rash    Current Outpatient Prescriptions  Medication Sig Dispense Refill  . amLODipine (NORVASC) 5 MG tablet Take 1 tablet (5 mg total) by mouth daily.  90 tablet  1  . aspirin EC 81 MG tablet Take 81 mg by mouth daily.      . benazepril (LOTENSIN) 40 MG tablet Take 1 tablet (40 mg total) by mouth daily.  90 tablet  3  . finasteride (PROSCAR) 5 MG tablet Take 5 mg by mouth daily.      . Garlic 1000 MG CAPS Take 1,000 mg by mouth daily.      Marland Kitchen losartan-hydrochlorothiazide (HYZAAR) 100-25 MG per tablet Take 1 tablet by mouth daily.      . metoprolol (LOPRESSOR) 100 MG tablet Take 50 mg by mouth daily.      . Multiple Vitamin (MULTIVITAMIN) tablet Take 1 tablet by mouth daily.      Marland Kitchen oxybutynin (DITROPAN-XL) 10 MG 24 hr tablet Take 10 mg by mouth daily.      . pantoprazole (PROTONIX) 40 MG tablet Take 1 tablet (40 mg total) by mouth 2 (two) times daily.  60 tablet  11  . polyethylene glycol (MIRALAX / GLYCOLAX) packet Take 17 g by mouth daily. As needed      . potassium chloride SA (K-DUR,KLOR-CON) 20 MEQ tablet Take 20 mEq by mouth daily.      . rosuvastatin (CRESTOR) 10 MG tablet Take 1 tablet (10 mg total)  by mouth daily.  30 tablet  6  . Tamsulosin HCl (FLOMAX) 0.4 MG CAPS Take 0.4 mg by mouth daily.      Marland Kitchen warfarin (COUMADIN) 5 MG tablet Take 1-1.5 tablets (5-7.5 mg total) by mouth as directed. Patient takes 1 tablet daily except 1 1/2 tablets on Mondays and Fridays  100 tablet  3  . Wheat Dextrin (BENEFIBER PO) Take by mouth. As needed       No current facility-administered medications for this visit.    Past Medical History  Diagnosis Date  . Atrial fibrillation, controlled     chronic coumadin  . COPD (chronic obstructive pulmonary disease)     sometimes uses Oxygen at home  . Hernia     hiatal hernia  . Abdominal aortic aneurysm without rupture     followed by [name]  . Kidney stones   . Liver cyst     aspirated on 10/10/11- no malignancy on bx  . Kidney cysts   . Sleep apnea     uses CPAP  . Reflux esophagitis 10/13  . Tubular adenoma 10/13  . Diverticulosis 10/13    pancolonic  . Hemorrhoids 10/13  . Hiatal hernia   . Atrial fibrillation   . AAA (abdominal aortic aneurysm)   .  HTN (hypertension)   . Hypercholesteremia   . BPH (benign prostatic hyperplasia)   . Pre-diabetes     Past Surgical History  Procedure Laterality Date  . Hernia repair      x2, bilateral inguinal   . Kidney stone surgery    . Knee arthroscopy      X2  . Colonoscopy      2003, no polyps per patient. [name]  . Nasal endoscopy    . Esophagogastroduodenoscopy  11/01/11    Dr. Jena Gauss- erosive reflux esophagitis along with a patulous EG junction, hialtal hernia, antral erosions with possible area of healing ulceration- s/p bx= chronic erosive gastritis, no malignancy  . Colonoscopy  11/01/11    Dr. Jena Gauss- tubular adenoma,suboptimal preparation, internal hemorrhoids, o/w normal rectum. pancolonic diverticulosis    WUJ:WJXBJY of systems complete and found to be negative unless listed above  PHYSICAL EXAM BP 130/71  Pulse 64  Ht 5\' 9"  (1.753 m)  Wt 233 lb (105.688 kg)  BMI  34.39 kg/m2  General: Well developed, well nourished, in no acute distress Head: Eyes PERRLA, No xanthomas.   Normal cephalic and atramatic  Lungs: Clear bilaterally to auscultation and percussion. Heart: HRRR S1 S2, with soft systolic murmur..  Pulses are 2+ & equal.            No carotid bruit. No JVD.  Abdomen: Bowel sounds are positive, abdomen soft and non-tender without masses or                  Hernia's noted. Msk:  Back normal, normal gait. Normal strength and tone for age. Extremities: No clubbing, cyanosis, non-pitting ankle edema.  DP +1 Neuro: Alert and oriented X 3. Psych:  Good affect, responds appropriately    ASSESSMENT AND PLAN

## 2012-09-26 NOTE — Assessment & Plan Note (Signed)
Heart rate controlled currently. He will continue current medications. He again refuses novel anticoagulation.

## 2012-09-26 NOTE — Assessment & Plan Note (Signed)
He is well controlled without complaints of HA or dizziness, positional light-headness. He is medically compliant and is avoiding salty foods. He will continue current medications. He will be seen again in 6 months.

## 2012-09-30 ENCOUNTER — Telehealth: Payer: Self-pay | Admitting: Adult Health

## 2012-09-30 MED ORDER — METOPROLOL TARTRATE 100 MG PO TABS: 50.0000 mg | ORAL_TABLET | Freq: Every day | ORAL | Status: DC

## 2012-09-30 MED ORDER — LOSARTAN POTASSIUM-HCTZ 100-25 MG PO TABS: 1.0000 | ORAL_TABLET | Freq: Every day | ORAL | Status: DC

## 2012-09-30 MED ORDER — POTASSIUM CHLORIDE CRYS ER 20 MEQ PO TBCR: 20.0000 meq | EXTENDED_RELEASE_TABLET | Freq: Every day | ORAL | Status: DC

## 2012-09-30 NOTE — Telephone Encounter (Signed)
Needs refills on Metoprolol 100mg , Losartan/HCTZ 100-25mg , Klor Con 20 sent to [facility] in [place].  Needs 90 day supply. / tgs

## 2012-09-30 NOTE — Telephone Encounter (Signed)
rx sent to pharmacy by e-script  

## 2012-10-09 ENCOUNTER — Ambulatory Visit (INDEPENDENT_AMBULATORY_CARE_PROVIDER_SITE_OTHER): Payer: Medicare PPO | Admitting: *Deleted

## 2012-10-09 DIAGNOSIS — I4891 Unspecified atrial fibrillation: Secondary | ICD-10-CM

## 2012-10-09 DIAGNOSIS — Z7901 Long term (current) use of anticoagulants: Secondary | ICD-10-CM

## 2012-10-09 LAB — POCT INR: INR: 2.3

## 2012-10-31 DIAGNOSIS — R490 Dysphonia: Secondary | ICD-10-CM | POA: Insufficient documentation

## 2012-10-31 DIAGNOSIS — J3801 Paralysis of vocal cords and larynx, unilateral: Secondary | ICD-10-CM | POA: Insufficient documentation

## 2012-11-20 ENCOUNTER — Ambulatory Visit (INDEPENDENT_AMBULATORY_CARE_PROVIDER_SITE_OTHER): Payer: Medicare PPO | Admitting: *Deleted

## 2012-11-20 DIAGNOSIS — I4891 Unspecified atrial fibrillation: Secondary | ICD-10-CM

## 2012-11-20 DIAGNOSIS — Z7901 Long term (current) use of anticoagulants: Secondary | ICD-10-CM

## 2012-12-20 ENCOUNTER — Other Ambulatory Visit: Payer: Self-pay | Admitting: Adult Health

## 2013-01-01 ENCOUNTER — Ambulatory Visit (INDEPENDENT_AMBULATORY_CARE_PROVIDER_SITE_OTHER): Payer: Medicare PPO | Admitting: *Deleted

## 2013-01-01 ENCOUNTER — Encounter: Payer: Self-pay | Admitting: *Deleted

## 2013-01-01 DIAGNOSIS — I4891 Unspecified atrial fibrillation: Secondary | ICD-10-CM

## 2013-01-01 DIAGNOSIS — Z7901 Long term (current) use of anticoagulants: Secondary | ICD-10-CM

## 2013-01-01 LAB — POCT INR: INR: 2

## 2013-02-12 ENCOUNTER — Ambulatory Visit (INDEPENDENT_AMBULATORY_CARE_PROVIDER_SITE_OTHER): Payer: Medicare PPO | Admitting: *Deleted

## 2013-02-12 DIAGNOSIS — Z7901 Long term (current) use of anticoagulants: Secondary | ICD-10-CM

## 2013-02-12 DIAGNOSIS — Z5181 Encounter for therapeutic drug level monitoring: Secondary | ICD-10-CM | POA: Insufficient documentation

## 2013-02-12 DIAGNOSIS — I4891 Unspecified atrial fibrillation: Secondary | ICD-10-CM

## 2013-02-12 LAB — POCT INR: INR: 2.1

## 2013-02-16 NOTE — Progress Notes (Signed)
HPI: [name] is a 80 year old patient to be est. with Dr. Harl Bowie who we are following for ongoing assessment and management of atrial fibrillation on chronic Coumadin therapy, hypertension,AAA, history of bradycardia.     He was last seen in the office in September 2014. Recent echocardiogram demonstrated a normal EF of 55-60% without evidence of valvular disease or dysfunction.    He comes today without complaint. He is medically complaint, denies chest pain or dyspnea. He has had recent cataract removal within the last 3 months on the right. He is looking for a new PCP. He is followed by [facility] but is unsatisfied with services.       Allergies  Allergen Reactions  . Latex Rash    Current Outpatient Prescriptions  Medication Sig Dispense Refill  . amLODipine (NORVASC) 5 MG tablet Take 1 tablet (5 mg total) by mouth daily.  90 tablet  1  . aspirin EC 81 MG tablet Take 81 mg by mouth daily.      . benazepril (LOTENSIN) 40 MG tablet Take 1 tablet (40 mg total) by mouth daily.  90 tablet  3  . finasteride (PROSCAR) 5 MG tablet Take 5 mg by mouth daily.      . Garlic 6195 MG CAPS Take 1,000 mg by mouth daily.      Marland Kitchen KLOR-CON M20 20 MEQ tablet TAKE ONE TABLET BY MOUTH ONCE DAILY  90 tablet  0  . losartan-hydrochlorothiazide (HYZAAR) 100-25 MG per tablet TAKE ONE TABLET BY MOUTH ONCE DAILY  90 tablet  0  . metoprolol (LOPRESSOR) 100 MG tablet Take 0.5 tablets (50 mg total) by mouth daily.  90 tablet  0  . Multiple Vitamin (MULTIVITAMIN) tablet Take 1 tablet by mouth daily.      Marland Kitchen oxybutynin (DITROPAN-XL) 10 MG 24 hr tablet Take 10 mg by mouth daily.      . pantoprazole (PROTONIX) 40 MG tablet Take 1 tablet (40 mg total) by mouth 2 (two) times daily.  60 tablet  11  . polyethylene glycol (MIRALAX / GLYCOLAX) packet Take 17 g by mouth daily. As needed      . rosuvastatin (CRESTOR) 10 MG tablet Take 1 tablet (10 mg total) by mouth daily.  30 tablet  6  . Tamsulosin HCl  (FLOMAX) 0.4 MG CAPS Take 0.4 mg by mouth daily.      Marland Kitchen warfarin (COUMADIN) 5 MG tablet Take 1-1.5 tablets (5-7.5 mg total) by mouth as directed. Patient takes 1 tablet daily except 1 1/2 tablets on Mondays and Fridays  100 tablet  3  . Wheat Dextrin (BENEFIBER PO) Take by mouth. As needed       No current facility-administered medications for this visit.    Past Medical History  Diagnosis Date  . Atrial fibrillation, controlled     chronic coumadin  . COPD (chronic obstructive pulmonary disease)     sometimes uses Oxygen at home  . Hernia     hiatal hernia  . Abdominal aortic aneurysm without rupture     followed by Dr. Luan Pulling  . Kidney stones   . Liver cyst     aspirated on 10/10/11- no malignancy on bx  . Kidney cysts   . Sleep apnea     uses CPAP  . Reflux esophagitis 10/13  . Tubular adenoma 10/13  . Diverticulosis 10/13    pancolonic  . Hemorrhoids 10/13  . Hiatal hernia   . Atrial fibrillation   . AAA (abdominal  aortic aneurysm)   . HTN (hypertension)   . Hypercholesteremia   . BPH (benign prostatic hyperplasia)   . Pre-diabetes     Past Surgical History  Procedure Laterality Date  . Hernia repair      x2, bilateral inguinal   . Kidney stone surgery    . Knee arthroscopy      X2  . Colonoscopy      2003, no polyps per patient. Dr. West Carbo  . Nasal endoscopy    . Esophagogastroduodenoscopy  11/01/11    Dr. Gala Romney- erosive reflux esophagitis along with a patulous EG junction, hialtal hernia, antral erosions with possible area of healing ulceration- s/p bx= chronic erosive gastritis, no malignancy  . Colonoscopy  11/01/11    Dr. Gala Romney- tubular adenoma,suboptimal preparation, internal hemorrhoids, o/w normal rectum. pancolonic diverticulosis    ROS: Review of systems complete and found to be negative unless listed above  PHYSICAL EXAM BP 138/60  Pulse 56  Ht 5\' 9"  (1.753 m)  Wt 240 lb (108.863 kg)  BMI 35.43 kg/m2  General: Well developed, well  nourished, in no acute distress Head: Eyes PERRLA, No xanthomas.   Normal cephalic and atramatic  Lungs: Clear bilaterally to auscultation and percussion. Heart: HIRR S1 S2, without MRG.  Pulses are 2+ & equal.            No carotid bruit. No JVD.  No abdominal bruits. No femoral bruits. Abdomen: Bowel sounds are positive, abdomen soft and non-tender without masses or                  Hernia's noted. Msk:  Back normal, normal gait. Normal strength and tone for age. Extremities: No clubbing, cyanosis or edema.  DP +1 Neuro: Alert and oriented X 3. Psych:  Good affect, responds appropriately    ASSESSMENT AND PLAN

## 2013-02-17 ENCOUNTER — Encounter: Payer: Self-pay | Admitting: Adult Health

## 2013-02-17 ENCOUNTER — Ambulatory Visit (INDEPENDENT_AMBULATORY_CARE_PROVIDER_SITE_OTHER): Payer: Medicare PPO | Admitting: Adult Health

## 2013-02-17 VITALS — BP 138/60 | HR 56 | Ht 69.0 in | Wt 240.0 lb

## 2013-02-17 DIAGNOSIS — E782 Mixed hyperlipidemia: Secondary | ICD-10-CM

## 2013-02-17 DIAGNOSIS — I4891 Unspecified atrial fibrillation: Secondary | ICD-10-CM

## 2013-02-17 DIAGNOSIS — I10 Essential (primary) hypertension: Secondary | ICD-10-CM

## 2013-02-17 DIAGNOSIS — I714 Abdominal aortic aneurysm, without rupture, unspecified: Secondary | ICD-10-CM

## 2013-02-17 MED ORDER — POTASSIUM CHLORIDE CRYS ER 20 MEQ PO TBCR: 20.0000 meq | EXTENDED_RELEASE_TABLET | Freq: Every day | ORAL | Status: DC

## 2013-02-17 MED ORDER — METOPROLOL TARTRATE 100 MG PO TABS: 50.0000 mg | ORAL_TABLET | Freq: Every day | ORAL | Status: DC

## 2013-02-17 MED ORDER — ROSUVASTATIN CALCIUM 10 MG PO TABS: 10.0000 mg | ORAL_TABLET | Freq: Every day | ORAL | Status: DC

## 2013-02-17 MED ORDER — BENAZEPRIL HCL 40 MG PO TABS: 40.0000 mg | ORAL_TABLET | Freq: Every day | ORAL | Status: DC

## 2013-02-17 MED ORDER — AMLODIPINE BESYLATE 5 MG PO TABS: 5.0000 mg | ORAL_TABLET | Freq: Every day | ORAL | Status: DC

## 2013-02-17 MED ORDER — LOSARTAN POTASSIUM-HCTZ 100-25 MG PO TABS: 1.0000 | ORAL_TABLET | Freq: Every day | ORAL | Status: DC

## 2013-02-17 MED ORDER — PANTOPRAZOLE SODIUM 40 MG PO TBEC: 40.0000 mg | DELAYED_RELEASE_TABLET | Freq: Two times a day (BID) | ORAL | Status: DC

## 2013-02-17 NOTE — Progress Notes (Deleted)
Name: Tristan Lopez    DOB: 1930/12/02  Age: 80 y.o.  MR#: [id number]       PCP:  Geroge Baseman      Insurance: Payor: HUMANA MEDICARE / Plan: HUMANA MEDICARE CHOICE PPO / Product Type: *No Product type* /   CC:    Chief Complaint  Patient presents with  . Hypertension  . Atrial Fibrillation    VS Filed Vitals:   02/17/13 1314  BP: 138/60  Pulse: 56  Height: 5\' 9"  (1.753 m)  Weight: 240 lb (108.863 kg)    Weights Current Weight  02/17/13 240 lb (108.863 kg)  09/26/12 233 lb (105.688 kg)  05/30/12 232 lb 6.4 oz (105.416 kg)    Blood Pressure  BP Readings from Last 3 Encounters:  02/17/13 138/60  09/26/12 130/71  05/30/12 116/62     Admit date:  (Not on file) Last encounter with RMR:  12/20/2012   Allergy Latex  Current Outpatient Prescriptions  Medication Sig Dispense Refill  . amLODipine (NORVASC) 5 MG tablet Take 1 tablet (5 mg total) by mouth daily.  90 tablet  1  . aspirin EC 81 MG tablet Take 81 mg by mouth daily.      . benazepril (LOTENSIN) 40 MG tablet Take 1 tablet (40 mg total) by mouth daily.  90 tablet  3  . finasteride (PROSCAR) 5 MG tablet Take 5 mg by mouth daily.      . Garlic 123XX123 MG CAPS Take 1,000 mg by mouth daily.      Marland Kitchen KLOR-CON M20 20 MEQ tablet TAKE ONE TABLET BY MOUTH ONCE DAILY  90 tablet  0  . losartan-hydrochlorothiazide (HYZAAR) 100-25 MG per tablet TAKE ONE TABLET BY MOUTH ONCE DAILY  90 tablet  0  . metoprolol (LOPRESSOR) 100 MG tablet Take 0.5 tablets (50 mg total) by mouth daily.  90 tablet  0  . Multiple Vitamin (MULTIVITAMIN) tablet Take 1 tablet by mouth daily.      Marland Kitchen oxybutynin (DITROPAN-XL) 10 MG 24 hr tablet Take 10 mg by mouth daily.      . pantoprazole (PROTONIX) 40 MG tablet Take 1 tablet (40 mg total) by mouth 2 (two) times daily.  60 tablet  11  . polyethylene glycol (MIRALAX / GLYCOLAX) packet Take 17 g by mouth daily. As needed      . rosuvastatin (CRESTOR) 10 MG tablet Take 1 tablet (10 mg total) by mouth daily.   30 tablet  6  . Tamsulosin HCl (FLOMAX) 0.4 MG CAPS Take 0.4 mg by mouth daily.      Marland Kitchen warfarin (COUMADIN) 5 MG tablet Take 1-1.5 tablets (5-7.5 mg total) by mouth as directed. Patient takes 1 tablet daily except 1 1/2 tablets on Mondays and Fridays  100 tablet  3  . Wheat Dextrin (BENEFIBER PO) Take by mouth. As needed       No current facility-administered medications for this visit.    Discontinued Meds:   There are no discontinued medications.  Patient Active Problem List   Diagnosis Date Noted  . Encounter for therapeutic drug monitoring 02/12/2013  . HTN (hypertension) 05/12/2012  . Long term (current) use of anticoagulants 03/27/2012  . A-fib 03/13/2012  . Anticoagulation adequate 03/13/2012  . Belching 10/08/2011  . GERD (gastroesophageal reflux disease) 10/08/2011  . Hoarseness or changing voice 10/08/2011  . Constipation 10/08/2011  . Upper abdominal pain 10/08/2011  . High risk medication use 10/08/2011  . Liver cyst 10/08/2011  . KNEE, ARTHRITIS, DEGEN./OSTEO 03/30/2009  LABS    Component Value Date/Time   NA 134* 10/04/2011 2110   K 3.5 10/04/2011 2110   CL 96 10/04/2011 2110   CO2 28 10/04/2011 2110   GLUCOSE 114* 10/04/2011 2110   BUN 22 10/04/2011 2110   CREATININE 0.92 10/04/2011 2110   CALCIUM 9.9 10/04/2011 2110   GFRNONAA 79* 10/04/2011 2110   GFRAA >90 10/04/2011 2110   CMP     Component Value Date/Time   NA 134* 10/04/2011 2110   K 3.5 10/04/2011 2110   CL 96 10/04/2011 2110   CO2 28 10/04/2011 2110   GLUCOSE 114* 10/04/2011 2110   BUN 22 10/04/2011 2110   CREATININE 0.92 10/04/2011 2110   CALCIUM 9.9 10/04/2011 2110   PROT 7.7 10/04/2011 2110   ALBUMIN 4.1 10/04/2011 2110   AST 25 10/04/2011 2110   ALT 19 10/04/2011 2110   ALKPHOS 59 10/04/2011 2110   BILITOT 0.9 10/04/2011 2110   GFRNONAA 79* 10/04/2011 2110   GFRAA >90 10/04/2011 2110       Component Value Date/Time   WBC 7.3 11/02/2011 0656   WBC 8.7 10/04/2011 2036   HGB 11.7* 11/02/2011 0656    HGB 14.6 10/04/2011 2036   HCT 35.7* 11/02/2011 0656   HCT 43.2 10/04/2011 2036   MCV 89.9 11/02/2011 0656   MCV 92.1 10/04/2011 2036    Lipid Panel  No results found for this basename: chol, trig, hdl, cholhdl, vldl, ldlcalc    ABG No results found for this basename: phart, pco2, pco2art, po2, po2art, hco3, tco2, acidbasedef, o2sat     No results found for this basename: TSH   BNP (last 3 results) No results found for this basename: PROBNP,  in the last 8760 hours Cardiac Panel (last 3 results) No results found for this basename: CKTOTAL, CKMB, TROPONINI, RELINDX,  in the last 72 hours  Iron/TIBC/Ferritin No results found for this basename: iron, tibc, ferritin     EKG Orders placed in visit on 05/12/12  . EKG 12-LEAD     Prior Assessment and Plan Problem List as of 02/17/2013     Cardiovascular and Mediastinum   A-fib   Last Assessment & Plan   09/26/2012 Office Visit Written 09/26/2012  3:30 PM by Lendon Colonel, NP     Heart rate controlled currently. He will continue current medications. He again refuses novel anticoagulation.     HTN (hypertension)   Last Assessment & Plan   09/26/2012 Office Visit Written 09/26/2012  3:29 PM by Lendon Colonel, NP     He is well controlled without complaints of HA or dizziness, positional light-headness. He is medically compliant and is avoiding salty foods. He will continue current medications. He will be seen again in 6 months.       Digestive   GERD (gastroesophageal reflux disease)   Constipation   Last Assessment & Plan   10/08/2011 Office Visit Written 10/08/2011  9:27 PM by Mahala Menghini, PA     Chronic constipation. No alarm symptoms. Add Miralax 17g daily prn. He has not had TCS in over 10 years. Recommend colonoscopy in near future for screening purposes. Again will plan to hold coumadin four days prior to procedure but will get approval from Dr. Alroy Dust.  I have discussed the risks, alternatives, benefits with regards to  but not limited to the risk of reaction to medication, bleeding, infection, perforation and the patient is agreeable to proceed. Written consent to be obtained.     Liver  cyst     Musculoskeletal and Integument   KNEE, ARTHRITIS, DEGEN./OSTEO     Other   Belching   Hoarseness or changing voice   Last Assessment & Plan   09/26/2012 Office Visit Written 09/26/2012  3:31 PM by Lendon Colonel, NP     He is to see ENT in [facility] next month for further evaluation.    Upper abdominal pain   Last Assessment & Plan   10/08/2011 Office Visit Written 10/08/2011  9:24 PM by Mahala Menghini, PA     Acute onset upper abdominal pain as outlined above.  Resolved during ED visit. Ileus on AAS. Large hepatic cysts stable in size (since 2010) but ?interal hemorrhage. Will discuss with Dr. Gala Romney and radiologist.     High risk medication use   Anticoagulation adequate   Last Assessment & Plan   05/12/2012 Office Visit Written 05/12/2012 10:32 AM by Josue Hector, MD     Rx INR 2 weeks ago F/u monthly    Long term (current) use of anticoagulants   Encounter for therapeutic drug monitoring       Imaging: No results found.

## 2013-02-17 NOTE — Assessment & Plan Note (Signed)
CT scan of abdomin for AAA size and evaluation.

## 2013-02-17 NOTE — Assessment & Plan Note (Addendum)
Excellent control of BP today. He will have refills completed on his antihypertensives. Check BMET.   He is inquiring about new PCP. He is being referred to Dr. Foy Guadalajara. I have spoken to Dr.Gosrani who is willing to accept this patient. They are given phone number and address. All lab results will be sent to their office.

## 2013-02-17 NOTE — Assessment & Plan Note (Signed)
Rate controlled. He will continue on metoprolol. Followed in our coumadin clinic in [place] office. CBC will be completed.

## 2013-02-17 NOTE — Patient Instructions (Signed)
Your physician wants you to follow-up in: 6 months with Dr Bryna Colander will receive a reminder letter in the mail two months in advance. If you don't receive a letter, please call our office to schedule the follow-up appointment.  Non-Cardiac CT scanning, (CAT scanning), is a noninvasive, special x-ray that produces cross-sectional images of the body using x-rays and a computer. CT scans help physicians diagnose and treat medical conditions. For some CT exams, a contrast material is used to enhance visibility in the area of the body being studied. CT scans provide greater clarity and reveal more details than regular x-ray exams.  Your physician recommends that you return for lab work in: THIS WEEK ([place] BMET,CBC,FLP,LFT)  [place] TEST RESULTS/INSTRUCTIONS/NEXT STEPS ONCE RECEIVED BY THE PROVIDER   PLEASE MAKE YOUR APPOINTMENT WITH DR. Karie Kirks AS ADVISED BY YOUR PROVIDER

## 2013-02-19 LAB — HEPATIC FUNCTION PANEL
ALK PHOS: 53 U/L (ref 39–117)
ALT: 16 U/L (ref 0–53)
AST: 21 U/L (ref 0–37)
Albumin: 4.1 g/dL (ref 3.5–5.2)
BILIRUBIN INDIRECT: 0.9 mg/dL (ref 0.2–1.2)
BILIRUBIN TOTAL: 1.1 mg/dL (ref 0.2–1.2)
Bilirubin, Direct: 0.2 mg/dL (ref 0.0–0.3)
Total Protein: 6.8 g/dL (ref 6.0–8.3)

## 2013-02-19 LAB — CBC
HCT: 41.3 % (ref 39.0–52.0)
Hemoglobin: 13.4 g/dL (ref 13.0–17.0)
MCH: 29.1 pg (ref 26.0–34.0)
MCHC: 32.4 g/dL (ref 30.0–36.0)
MCV: 89.8 fL (ref 78.0–100.0)
PLATELETS: 162 10*3/uL (ref 150–400)
RBC: 4.6 MIL/uL (ref 4.22–5.81)
RDW: 14.1 % (ref 11.5–15.5)
WBC: 4 10*3/uL (ref 4.0–10.5)

## 2013-02-19 LAB — LIPID PANEL
Cholesterol: 147 mg/dL (ref 0–200)
HDL: 50 mg/dL (ref >39)
LDL CALC: 80 mg/dL (ref 0–99)
TRIGLYCERIDES: 86 mg/dL (ref <150)
Total CHOL/HDL Ratio: 2.9 Ratio
VLDL: 17 mg/dL (ref 0–40)

## 2013-02-19 LAB — BASIC METABOLIC PANEL
BUN: 18 mg/dL (ref 6–23)
CALCIUM: 9.5 mg/dL (ref 8.4–10.5)
CO2: 30 meq/L (ref 19–32)
Chloride: 102 mEq/L (ref 96–112)
Creat: 0.83 mg/dL (ref 0.50–1.35)
GLUCOSE: 94 mg/dL (ref 70–99)
Potassium: 4 mEq/L (ref 3.5–5.3)
SODIUM: 140 meq/L (ref 135–145)

## 2013-02-23 ENCOUNTER — Encounter (HOSPITAL_COMMUNITY): Payer: Self-pay

## 2013-02-23 ENCOUNTER — Ambulatory Visit (HOSPITAL_COMMUNITY): Admission: RE | Admit: 2013-02-23 | Payer: Medicare PPO | Source: Ambulatory Visit

## 2013-02-23 ENCOUNTER — Ambulatory Visit (HOSPITAL_COMMUNITY)
Admission: RE | Admit: 2013-02-23 | Discharge: 2013-02-23 | Disposition: A | Discharge status: Home / Self Care | Payer: Medicare PPO | Source: Ambulatory Visit | Attending: Adult Health | Admitting: Adult Health

## 2013-02-23 DIAGNOSIS — K7689 Other specified diseases of liver: Secondary | ICD-10-CM | POA: Insufficient documentation

## 2013-02-23 DIAGNOSIS — I714 Abdominal aortic aneurysm, without rupture, unspecified: Secondary | ICD-10-CM | POA: Insufficient documentation

## 2013-02-23 DIAGNOSIS — N200 Calculus of kidney: Secondary | ICD-10-CM | POA: Insufficient documentation

## 2013-02-23 DIAGNOSIS — Q619 Cystic kidney disease, unspecified: Secondary | ICD-10-CM | POA: Insufficient documentation

## 2013-02-23 DIAGNOSIS — Z09 Encounter for follow-up examination after completed treatment for conditions other than malignant neoplasm: Secondary | ICD-10-CM | POA: Insufficient documentation

## 2013-02-23 MED ORDER — IOHEXOL 300 MG/ML  SOLN
100.0000 mL | Freq: Once | INTRAMUSCULAR | Status: AC | PRN
  Administered 2013-02-23: 100 mL via INTRAVENOUS

## 2013-02-23 NOTE — Addendum Note (Signed)
Addended by: Truett Mainland on: 02/23/2013 10:56 AM   Modules accepted: Orders

## 2013-02-23 NOTE — Addendum Note (Signed)
Addended by: Truett Mainland on: 02/23/2013 01:06 PM   Modules accepted: Orders

## 2013-02-26 ENCOUNTER — Encounter: Payer: Self-pay | Admitting: Internal Medicine

## 2013-03-26 ENCOUNTER — Telehealth: Payer: Self-pay | Admitting: Adult Health

## 2013-03-26 ENCOUNTER — Ambulatory Visit (INDEPENDENT_AMBULATORY_CARE_PROVIDER_SITE_OTHER): Payer: Medicare PPO | Admitting: *Deleted

## 2013-03-26 DIAGNOSIS — Z5181 Encounter for therapeutic drug level monitoring: Secondary | ICD-10-CM

## 2013-03-26 DIAGNOSIS — Z7901 Long term (current) use of anticoagulants: Secondary | ICD-10-CM

## 2013-03-26 DIAGNOSIS — I4891 Unspecified atrial fibrillation: Secondary | ICD-10-CM

## 2013-03-26 LAB — POCT INR: INR: 2.4

## 2013-03-26 MED ORDER — POTASSIUM CHLORIDE CRYS ER 20 MEQ PO TBCR: 20.0000 meq | EXTENDED_RELEASE_TABLET | Freq: Every day | ORAL | Status: DC

## 2013-03-26 NOTE — Telephone Encounter (Signed)
rx refilled per request

## 2013-03-26 NOTE — Telephone Encounter (Signed)
Received fax refill request  Rx # [id number] Medication:  Klor-conm 20 tab Qty 90 Sig:  Take one tablet by mouth once daily Physician:  Purcell Nails

## 2013-03-31 ENCOUNTER — Ambulatory Visit: Payer: Medicare PPO | Admitting: Internal Medicine

## 2013-03-31 ENCOUNTER — Encounter: Payer: Self-pay | Admitting: Internal Medicine

## 2013-03-31 ENCOUNTER — Ambulatory Visit (INDEPENDENT_AMBULATORY_CARE_PROVIDER_SITE_OTHER): Payer: Medicare PPO | Admitting: Internal Medicine

## 2013-03-31 VITALS — BP 118/63 | HR 51 | Temp 97.2°F | Wt 238.6 lb

## 2013-03-31 DIAGNOSIS — K219 Gastro-esophageal reflux disease without esophagitis: Secondary | ICD-10-CM

## 2013-03-31 NOTE — Patient Instructions (Addendum)
Decrease Benefiber to 2 teaspoons daily  Use Miralax as needed for constipation  Continue Protonix  GERD information provided  Loose weight as previously recommended  Office visit in 1 year

## 2013-03-31 NOTE — Progress Notes (Signed)
Primary Care Physician:  [name], MD Primary Gastroenterologist:  Dr. Gala Romney  Pre-Procedure History & Physical: HPI:  Tristan Lopez is a 80 y.o. male here for followup of GERD. Bowel function has been good. States he history bulky stools daily. Denies constipation or diarrhea. Takes 2 heaping tablespoons of Benefiber daily. No melena or rectal bleeding. Chronic hoarseness evaluated in [place]. Not felt to be due to reflux apparently.  AAA stable on CT scanning (3.5 cm). His gained 6 pounds since his last office visit. Reflux well controlled, but requires Protonix 40 mg twice daily.  Past Medical History  Diagnosis Date  . Atrial fibrillation, controlled     chronic coumadin  . COPD (chronic obstructive pulmonary disease)     sometimes uses Oxygen at home  . Hernia     hiatal hernia  . Abdominal aortic aneurysm without rupture     followed by Dr. Luan Pulling  . Kidney stones   . Liver cyst     aspirated on 10/10/11- no malignancy on bx  . Kidney cysts   . Sleep apnea     uses CPAP  . Reflux esophagitis 10/13  . Tubular adenoma 10/13  . Diverticulosis 10/13    pancolonic  . Hemorrhoids 10/13  . Hiatal hernia   . Atrial fibrillation   . AAA (abdominal aortic aneurysm)   . HTN (hypertension)   . Hypercholesteremia   . BPH (benign prostatic hyperplasia)   . Pre-diabetes     Past Surgical History  Procedure Laterality Date  . Hernia repair      x2, bilateral inguinal   . Kidney stone surgery    . Knee arthroscopy      X2  . Colonoscopy      2003, no polyps per patient. Dr. West Carbo  . Nasal endoscopy    . Esophagogastroduodenoscopy  11/01/11    Dr. Gala Romney- erosive reflux esophagitis along with a patulous EG junction, hialtal hernia, antral erosions with possible area of healing ulceration- s/p bx= chronic erosive gastritis, no malignancy  . Colonoscopy  11/01/11    Dr. Gala Romney- tubular adenoma,suboptimal preparation, internal hemorrhoids, o/w normal  rectum. pancolonic diverticulosis    Prior to Admission medications   Medication Sig Start Date End Date Taking? Authorizing Provider  amLODipine (NORVASC) 5 MG tablet Take 1 tablet (5 mg total) by mouth daily. 02/17/13  Yes Lendon Colonel, NP  aspirin EC 81 MG tablet Take 81 mg by mouth daily.   Yes Historical Provider, MD  benazepril (LOTENSIN) 40 MG tablet Take 1 tablet (40 mg total) by mouth daily. 02/17/13  Yes Lendon Colonel, NP  finasteride (PROSCAR) 5 MG tablet Take 5 mg by mouth daily.   Yes Historical Provider, MD  Garlic 5465 MG CAPS Take 1,000 mg by mouth daily.   Yes Historical Provider, MD  losartan-hydrochlorothiazide (HYZAAR) 100-25 MG per tablet Take 1 tablet by mouth daily. 02/17/13  Yes Lendon Colonel, NP  metoprolol (LOPRESSOR) 100 MG tablet Take 0.5 tablets (50 mg total) by mouth daily. 02/17/13  Yes Lendon Colonel, NP  Multiple Vitamin (MULTIVITAMIN) tablet Take 1 tablet by mouth daily.   Yes Historical Provider, MD  oxybutynin (DITROPAN-XL) 10 MG 24 hr tablet Take 10 mg by mouth daily.   Yes Historical Provider, MD  pantoprazole (PROTONIX) 40 MG tablet Take 1 tablet (40 mg total) by mouth 2 (two) times daily. 02/17/13  Yes Lendon Colonel, NP  polyethylene glycol [facility] / GLYCOLAX) packet Take 17 g  by mouth daily. As needed   Yes Historical Provider, MD  potassium chloride SA (KLOR-CON M20) 20 MEQ tablet Take 1 tablet (20 mEq total) by mouth daily. 03/26/13  Yes Lendon Colonel, NP  rosuvastatin (CRESTOR) 10 MG tablet Take 1 tablet (10 mg total) by mouth daily. 02/17/13  Yes Lendon Colonel, NP  Tamsulosin HCl (FLOMAX) 0.4 MG CAPS Take 0.4 mg by mouth daily.   Yes Historical Provider, MD  warfarin (COUMADIN) 5 MG tablet Take 1-1.5 tablets (5-7.5 mg total) by mouth as directed. Patient takes 1 tablet daily except 1 1/2 tablets on Mondays and Fridays 07/17/12  Yes Josue Hector, MD  Wheat Dextrin (BENEFIBER PO) Take by mouth. As needed   Yes Historical  Provider, MD    Allergies as of 03/31/2013 - Review Complete 03/31/2013  Allergen Reaction Noted  . Latex Rash 10/04/2011    Family History  Problem Relation Age of Onset  . Colon cancer Neg Hx   . Liver disease Neg Hx   . Breast cancer Mother   . Heart disease Father     pacemaker    History   Social History  . Marital Status: Married    Spouse Name: N/A    Number of Children: 3  . Years of Education: N/A   Occupational History  .     Social History Main Topics  . Smoking status: Former Smoker -- 0.50 packs/day    Types: Cigarettes  . Smokeless tobacco: Not on file     Comment: quit about 10 years  . Alcohol Use: No  . Drug Use: No  . Sexual Activity: Not on file   Other Topics Concern  . Not on file   Social History Narrative  . No narrative on file    Review of Systems: See HPI, otherwise negative ROS  Physical Exam: BP 118/63  Pulse 51  Temp(Src) 97.2 F (36.2 C) (Oral)  Wt 238 lb 9.6 oz (108.228 kg) General:   Alert,  Well-developed, well-nourished, pleasant and cooperative in NAD Skin:  Intact without significant lesions or rashes. Eyes:  Sclera clear, no icterus.   Conjunctiva pink. Ears:  Normal auditory acuity. Nose:  No deformity, discharge,  or lesions. Mouth:  No deformity or lesions. Neck:  Supple; no masses or thyromegaly. No significant cervical adenopathy. Lungs:  Clear throughout to auscultation.   No wheezes, crackles, or rhonchi. No acute distress. Heart:  Regular rate and rhythm; no murmurs, clicks, rubs,  or gallops. Abdomen: Non-distended, normal bowel sounds.  Soft and nontender without appreciable mass or hepatosplenomegaly.  Pulses:  Normal pulses noted. Extremities:  Without clubbing or edema.  Impression:  Pleasant elderly 80 year old gentleman with erosive reflux esophagitis on twice a day PPI therapy  - doing well from a standpoint of reflux. Overeating and weight gain are undermining our efforts to treat reflux. Sounds  like hoarseness was evaluated extensively in [place] and over at [facility] in [facility]. Complains of increase stool bulk but having no difficulty stooling  - upwards of 3 times daily. In part this may be due to daily fiber supplementation and in part caloric intake in excess.   Recommendations:   Continue Protonix 40 mg twice daily. Decrease Benefiber from 2 heaping tablespoons to 2 teaspoons daily. GERD literature provided. Recommend weight loss.   Regular exercise to Unless something comes up, we'll plan to see this nice gentleman back in one year.

## 2013-05-07 ENCOUNTER — Ambulatory Visit (INDEPENDENT_AMBULATORY_CARE_PROVIDER_SITE_OTHER): Payer: Medicare PPO | Admitting: *Deleted

## 2013-05-07 DIAGNOSIS — Z7901 Long term (current) use of anticoagulants: Secondary | ICD-10-CM

## 2013-05-07 DIAGNOSIS — Z5181 Encounter for therapeutic drug level monitoring: Secondary | ICD-10-CM

## 2013-05-07 DIAGNOSIS — I4891 Unspecified atrial fibrillation: Secondary | ICD-10-CM

## 2013-05-07 LAB — POCT INR: INR: 2.6

## 2013-05-29 ENCOUNTER — Ambulatory Visit: Payer: Medicare PPO | Admitting: Internal Medicine

## 2013-06-18 ENCOUNTER — Ambulatory Visit (INDEPENDENT_AMBULATORY_CARE_PROVIDER_SITE_OTHER): Payer: Medicare PPO | Admitting: *Deleted

## 2013-06-18 DIAGNOSIS — I4891 Unspecified atrial fibrillation: Secondary | ICD-10-CM

## 2013-06-18 DIAGNOSIS — Z5181 Encounter for therapeutic drug level monitoring: Secondary | ICD-10-CM

## 2013-06-18 DIAGNOSIS — Z7901 Long term (current) use of anticoagulants: Secondary | ICD-10-CM

## 2013-06-18 LAB — POCT INR: INR: 2.2

## 2013-07-13 ENCOUNTER — Telehealth: Payer: Self-pay | Admitting: Cardiovascular Disease

## 2013-07-13 MED ORDER — WARFARIN SODIUM 5 MG PO TABS: 5.0000 mg | ORAL_TABLET | ORAL | Status: DC

## 2013-07-13 NOTE — Telephone Encounter (Signed)
Received fax refill request  Rx # [id number] Medication:  Warfarin 5 mg tab Qty 100 Sig:  Take 1 to 1/2 tablets by mouth as directed / patient takes 1 tablet daily except 1 and 1/2 tablets on Monday Physician:  Johnsie Cancel

## 2013-07-30 ENCOUNTER — Ambulatory Visit (INDEPENDENT_AMBULATORY_CARE_PROVIDER_SITE_OTHER): Payer: Medicare PPO | Admitting: *Deleted

## 2013-07-30 DIAGNOSIS — Z5181 Encounter for therapeutic drug level monitoring: Secondary | ICD-10-CM

## 2013-07-30 DIAGNOSIS — Z7901 Long term (current) use of anticoagulants: Secondary | ICD-10-CM

## 2013-07-30 DIAGNOSIS — I4891 Unspecified atrial fibrillation: Secondary | ICD-10-CM

## 2013-07-30 LAB — POCT INR: INR: 2.5

## 2013-08-15 ENCOUNTER — Other Ambulatory Visit: Payer: Self-pay | Admitting: Adult Health

## 2013-08-24 ENCOUNTER — Other Ambulatory Visit: Payer: Self-pay | Admitting: *Deleted

## 2013-08-24 MED ORDER — PANTOPRAZOLE SODIUM 40 MG PO TBEC: 40.0000 mg | DELAYED_RELEASE_TABLET | Freq: Two times a day (BID) | ORAL | Status: DC

## 2013-09-10 ENCOUNTER — Ambulatory Visit (INDEPENDENT_AMBULATORY_CARE_PROVIDER_SITE_OTHER): Payer: Medicare PPO | Admitting: *Deleted

## 2013-09-10 DIAGNOSIS — Z7901 Long term (current) use of anticoagulants: Secondary | ICD-10-CM

## 2013-09-10 DIAGNOSIS — Z5181 Encounter for therapeutic drug level monitoring: Secondary | ICD-10-CM

## 2013-09-10 DIAGNOSIS — I4891 Unspecified atrial fibrillation: Secondary | ICD-10-CM

## 2013-09-10 LAB — POCT INR: INR: 3.3

## 2013-10-01 ENCOUNTER — Ambulatory Visit (INDEPENDENT_AMBULATORY_CARE_PROVIDER_SITE_OTHER): Payer: Medicare PPO | Admitting: *Deleted

## 2013-10-01 DIAGNOSIS — I4891 Unspecified atrial fibrillation: Secondary | ICD-10-CM

## 2013-10-01 DIAGNOSIS — Z7901 Long term (current) use of anticoagulants: Secondary | ICD-10-CM

## 2013-10-01 DIAGNOSIS — Z5181 Encounter for therapeutic drug level monitoring: Secondary | ICD-10-CM

## 2013-10-01 LAB — POCT INR: INR: 2.6

## 2013-10-03 ENCOUNTER — Other Ambulatory Visit: Payer: Self-pay | Admitting: Adult Health

## 2013-10-05 NOTE — Telephone Encounter (Signed)
Medication sent to pharmacy  

## 2013-10-26 ENCOUNTER — Encounter: Payer: Self-pay | Admitting: *Deleted

## 2013-10-26 ENCOUNTER — Telehealth: Payer: Self-pay | Admitting: Adult Health

## 2013-10-26 ENCOUNTER — Ambulatory Visit (INDEPENDENT_AMBULATORY_CARE_PROVIDER_SITE_OTHER): Payer: Medicare PPO | Admitting: Pharmacist

## 2013-10-26 DIAGNOSIS — Z5181 Encounter for therapeutic drug level monitoring: Secondary | ICD-10-CM

## 2013-10-26 DIAGNOSIS — I4891 Unspecified atrial fibrillation: Secondary | ICD-10-CM

## 2013-10-26 DIAGNOSIS — Z7901 Long term (current) use of anticoagulants: Secondary | ICD-10-CM

## 2013-10-26 LAB — POCT INR: INR: 2.2

## 2013-10-26 MED ORDER — LOSARTAN POTASSIUM-HCTZ 100-25 MG PO TABS: 1.0000 | ORAL_TABLET | Freq: Every day | ORAL | Status: DC

## 2013-10-26 NOTE — Telephone Encounter (Signed)
Pt needs fu apt,sent message to tanya to schedule,refilled 1 month for pt, as I did last month also

## 2013-10-26 NOTE — Telephone Encounter (Signed)
Received fax refill request  Rx # [id number] Medication:  Losartan / HCTZ 100-25 mg tab Qty 90 Sig:  Take one tablet by mouth once daily Physician:  Purcell Nails Received fax refill request  Rx # [id number]  Medication:  Amlodipine Besylate 5 mg tab Qty 90 Sig:  Take one tablet by mouth once daily Physician:  Purcell Nails

## 2013-11-07 ENCOUNTER — Other Ambulatory Visit: Payer: Self-pay | Admitting: Adult Health

## 2013-11-19 ENCOUNTER — Encounter: Payer: Self-pay | Admitting: Cardiology

## 2013-11-19 ENCOUNTER — Ambulatory Visit (INDEPENDENT_AMBULATORY_CARE_PROVIDER_SITE_OTHER): Payer: Medicare PPO | Admitting: Cardiology

## 2013-11-19 VITALS — BP 168/78 | HR 64 | Ht 68.0 in | Wt 242.0 lb

## 2013-11-19 DIAGNOSIS — E785 Hyperlipidemia, unspecified: Secondary | ICD-10-CM | POA: Insufficient documentation

## 2013-11-19 DIAGNOSIS — I10 Essential (primary) hypertension: Secondary | ICD-10-CM

## 2013-11-19 DIAGNOSIS — I714 Abdominal aortic aneurysm, without rupture, unspecified: Secondary | ICD-10-CM

## 2013-11-19 DIAGNOSIS — I482 Chronic atrial fibrillation, unspecified: Secondary | ICD-10-CM

## 2013-11-19 NOTE — Progress Notes (Signed)
Reason for visit: Atrial fibrillation, AAA  Clinical Summary [name] is an 80 y.o.male last seen in the office by Ms. Lawrence NP in January 2015. He is a former patient Dr. Johnsie Lopez, this is our first meeting in the office. He is here with his wife today. From a cardiac perspective, reports no palpitations or exertional chest pain. He is following with Dr. Carin Lopez, had a recent adjustment in his diet, and seems to have more energy. He reports no bleeding problems on Coumadin. ECG today shows rate-controlled atrial fibrillation with PVCs versus aberrantly conducted beats.  Echocardiogram from July 2014 reported moderate LVH with LVEF 55-60%, aortic valve sclerosis without stenosis, moderate left atrial enlargement, mild tricuspid regurgitation, PASP 38 mmHg.  CT of the abdomen and pelvis back in February 2015 showed a 3.5 cm infrarenal abdominal aortic aneurysm with minimal change compared to 2013.  He did have recent lab work which will be requested for review.   Allergies  Allergen Reactions  . Latex Rash    Current Outpatient Prescriptions  Medication Sig Dispense Refill  . amLODipine (NORVASC) 5 MG tablet TAKE ONE TABLET BY MOUTH ONCE DAILY  90 tablet  0  . aspirin EC 81 MG tablet Take 81 mg by mouth daily.      . benazepril (LOTENSIN) 40 MG tablet Take 1 tablet (40 mg total) by mouth daily.  90 tablet  1  . finasteride (PROSCAR) 5 MG tablet Take 5 mg by mouth daily.      . Garlic 5784 MG CAPS Take 1,000 mg by mouth daily.      Marland Kitchen losartan-hydrochlorothiazide (HYZAAR) 100-25 MG per tablet Take 1 tablet by mouth daily.  30 tablet  0  . metoprolol (LOPRESSOR) 100 MG tablet Take 0.5 tablets (50 mg total) by mouth daily.  90 tablet  1  . oxybutynin (DITROPAN-XL) 10 MG 24 hr tablet Take 10 mg by mouth daily.      . pantoprazole (PROTONIX) 40 MG tablet Take 1 tablet (40 mg total) by mouth 2 (two) times daily.  180 tablet  1  . polyethylene glycol (MIRALAX / GLYCOLAX) packet Take 17 g by  mouth daily. As needed      . potassium chloride SA (KLOR-CON M20) 20 MEQ tablet Take 1 tablet (20 mEq total) by mouth daily.  90 tablet  6  . rosuvastatin (CRESTOR) 10 MG tablet Take 1 tablet (10 mg total) by mouth daily.  90 tablet  1  . Tamsulosin HCl (FLOMAX) 0.4 MG CAPS Take 0.4 mg by mouth daily.      Marland Kitchen warfarin (COUMADIN) 5 MG tablet Take 1-1.5 tablets (5-7.5 mg total) by mouth as directed. Patient takes 1 tablet daily except 1 1/2 tablets on Mondays and Fridays  100 tablet  3  . Wheat Dextrin (BENEFIBER PO) Take by mouth. As needed      . FLUZONE HIGH-DOSE 0.5 ML SUSY        No current facility-administered medications for this visit.    Past Medical History  Diagnosis Date  . Chronic atrial fibrillation   . COPD (chronic obstructive pulmonary disease)   . Hiatal hernia   . Abdominal aortic aneurysm without rupture   . Nephrolithiasis   . Liver cyst     Benign by liver biopsy September 2013  . Bilateral renal cysts   . Sleep apnea     Uses CPAP  . Reflux esophagitis   . Tubular adenoma   . Diverticulosis     Pancolonic  .  Hemorrhoids   . Essential hypertension   . Hypercholesteremia   . BPH (benign prostatic hyperplasia)   . Pre-diabetes     Past Surgical History  Procedure Laterality Date  . Hernia repair      x2, bilateral inguinal   . Kidney stone surgery    . Knee arthroscopy      X2  . Colonoscopy      2003, no polyps per patient. Dr. West Lopez  . Nasal endoscopy    . Esophagogastroduodenoscopy  11/01/11    Dr. Gala Lopez- erosive reflux esophagitis along with a patulous EG junction, hialtal hernia, antral erosions with possible area of healing ulceration- s/p bx= chronic erosive gastritis, no malignancy  . Colonoscopy  11/01/11    Dr. Gala Lopez- tubular adenoma,suboptimal preparation, internal hemorrhoids, o/w normal rectum. pancolonic diverticulosis    Family History  Problem Relation Age of Onset  . Colon cancer Neg Hx   . Liver disease Neg Hx   . Breast  cancer Mother   . Heart disease Father     Pacemaker    Social History [name] reports that he has quit smoking. His smoking use included Cigarettes. He smoked 0.50 packs per day. He has never used smokeless tobacco. [name] reports that he does not drink alcohol.  Review of Systems Complete review of systems negative except as otherwise outlined in the clinical summary and also the following. No bleeding problems. Improving energy. No claudication. No falls.  Physical Examination Filed Vitals:   11/19/13 1250  BP: 168/78  Pulse: 64   Filed Weights   11/19/13 1250  Weight: 242 lb (109.77 kg)   Overweight male, appears comfortable at rest. HEENT: Conjunctiva and lids normal, oropharynx clear. Neck: Supple, no elevated JVP or carotid bruits, no thyromegaly. Lungs: Clear to auscultation, nonlabored breathing at rest. Cardiac: Irregularly irregular, no S3, soft systolic murmur, no pericardial rub. Abdomen: Soft, nontender, bowel sounds present, no guarding or rebound. No bruit. Extremities: Trace edema, distal pulses 2+. Skin: Warm and dry. Musculoskeletal: No kyphosis. Neuropsychiatric: Alert and oriented x3, affect grossly appropriate.   Problem List and Plan   Chronic atrial fibrillation Continues on Coumadin, rate control with Lopressor. ECG reviewed and stable. No changes made today.  AAA (abdominal aortic aneurysm) without rupture CTA in February of this year showed 3.5 cm infrarenal AAA, no significant change since 2013.  Hyperlipidemia On Crestor, followed by Dr. Anastasio Lopez. Will request most recent lab work for review.  Essential hypertension Blood pressure elevated today. He is on a fairly broad antihypertensive regimen. Discussed further weight loss, salt restriction. Keep follow-up with Dr. Anastasio Lopez.    Satira Sark, M.D., F.A.C.C.

## 2013-11-19 NOTE — Patient Instructions (Signed)
Your physician wants you to follow-up in: 6 months You will receive a reminder letter in the mail two months in advance. If you don't receive a letter, please call our office to schedule the follow-up appointment.     Your physician recommends that you continue on your current medications as directed. Please refer to the Current Medication list given to you today.      Thank you for choosing [facility] !        

## 2013-11-19 NOTE — Assessment & Plan Note (Signed)
Blood pressure elevated today. He is on a fairly broad antihypertensive regimen. Discussed further weight loss, salt restriction. Keep follow-up with Dr. Anastasio Champion.

## 2013-11-19 NOTE — Assessment & Plan Note (Signed)
Continues on Coumadin, rate control with Lopressor. ECG reviewed and stable. No changes made today.

## 2013-11-19 NOTE — Assessment & Plan Note (Signed)
On Crestor, followed by Dr. Anastasio Champion. Will request most recent lab work for review.

## 2013-11-19 NOTE — Assessment & Plan Note (Signed)
CTA in February of this year showed 3.5 cm infrarenal AAA, no significant change since 2013.

## 2013-11-30 ENCOUNTER — Other Ambulatory Visit: Payer: Self-pay | Admitting: Adult Health

## 2013-11-30 MED ORDER — LOSARTAN POTASSIUM-HCTZ 100-25 MG PO TABS: 1.0000 | ORAL_TABLET | Freq: Every day | ORAL | Status: DC

## 2013-11-30 NOTE — Telephone Encounter (Signed)
Patient needs refill on Losartan 90 day supply sent to [facility] in [place] / tgs

## 2013-12-09 ENCOUNTER — Ambulatory Visit (INDEPENDENT_AMBULATORY_CARE_PROVIDER_SITE_OTHER): Payer: Medicare PPO | Admitting: *Deleted

## 2013-12-09 DIAGNOSIS — I482 Chronic atrial fibrillation, unspecified: Secondary | ICD-10-CM

## 2013-12-09 DIAGNOSIS — I4891 Unspecified atrial fibrillation: Secondary | ICD-10-CM

## 2013-12-09 DIAGNOSIS — Z5181 Encounter for therapeutic drug level monitoring: Secondary | ICD-10-CM

## 2013-12-09 DIAGNOSIS — Z7901 Long term (current) use of anticoagulants: Secondary | ICD-10-CM

## 2013-12-09 LAB — POCT INR: INR: 3.2

## 2014-01-04 ENCOUNTER — Other Ambulatory Visit: Payer: Self-pay | Admitting: Adult Health

## 2014-01-20 ENCOUNTER — Ambulatory Visit (INDEPENDENT_AMBULATORY_CARE_PROVIDER_SITE_OTHER): Payer: Medicare PPO | Admitting: *Deleted

## 2014-01-20 DIAGNOSIS — I4891 Unspecified atrial fibrillation: Secondary | ICD-10-CM

## 2014-01-20 DIAGNOSIS — I482 Chronic atrial fibrillation, unspecified: Secondary | ICD-10-CM

## 2014-01-20 DIAGNOSIS — Z5181 Encounter for therapeutic drug level monitoring: Secondary | ICD-10-CM

## 2014-01-20 DIAGNOSIS — Z7901 Long term (current) use of anticoagulants: Secondary | ICD-10-CM

## 2014-01-20 LAB — POCT INR: INR: 3.2

## 2014-03-03 ENCOUNTER — Ambulatory Visit (INDEPENDENT_AMBULATORY_CARE_PROVIDER_SITE_OTHER): Payer: Medicare PPO | Admitting: *Deleted

## 2014-03-03 DIAGNOSIS — I482 Chronic atrial fibrillation, unspecified: Secondary | ICD-10-CM

## 2014-03-03 DIAGNOSIS — I4891 Unspecified atrial fibrillation: Secondary | ICD-10-CM

## 2014-03-03 DIAGNOSIS — Z5181 Encounter for therapeutic drug level monitoring: Secondary | ICD-10-CM

## 2014-03-03 DIAGNOSIS — Z7901 Long term (current) use of anticoagulants: Secondary | ICD-10-CM

## 2014-03-03 LAB — POCT INR: INR: 1.9

## 2014-04-05 ENCOUNTER — Ambulatory Visit (INDEPENDENT_AMBULATORY_CARE_PROVIDER_SITE_OTHER): Payer: Medicare PPO | Admitting: *Deleted

## 2014-04-05 DIAGNOSIS — Z7901 Long term (current) use of anticoagulants: Secondary | ICD-10-CM

## 2014-04-05 DIAGNOSIS — Z5181 Encounter for therapeutic drug level monitoring: Secondary | ICD-10-CM

## 2014-04-05 DIAGNOSIS — I482 Chronic atrial fibrillation, unspecified: Secondary | ICD-10-CM

## 2014-04-05 DIAGNOSIS — I4891 Unspecified atrial fibrillation: Secondary | ICD-10-CM

## 2014-04-05 LAB — POCT INR: INR: 2.5

## 2014-04-15 ENCOUNTER — Encounter: Payer: Self-pay | Admitting: Internal Medicine

## 2014-04-15 ENCOUNTER — Ambulatory Visit (INDEPENDENT_AMBULATORY_CARE_PROVIDER_SITE_OTHER): Payer: Medicare PPO | Admitting: Internal Medicine

## 2014-04-15 ENCOUNTER — Ambulatory Visit: Payer: Medicare PPO | Admitting: Internal Medicine

## 2014-04-15 VITALS — BP 135/72 | HR 70 | Temp 98.2°F | Ht 68.5 in | Wt 241.2 lb

## 2014-04-15 DIAGNOSIS — K5900 Constipation, unspecified: Secondary | ICD-10-CM

## 2014-04-15 DIAGNOSIS — K219 Gastro-esophageal reflux disease without esophagitis: Secondary | ICD-10-CM | POA: Diagnosis not present

## 2014-04-15 NOTE — Patient Instructions (Signed)
GERD information provided  Try and loose some weight  Continue protonix twice daily  Continue miralax as needed for constipation  Office visit with [place] in 1 year

## 2014-04-15 NOTE — Progress Notes (Signed)
Primary Care Physician:  [name], MD Primary Gastroenterologist:  Dr. Gala Romney  Pre-Procedure History & Physical: HPI:  Tristan Lopez is a 81 y.o. male here for followup of GERD and constipation patient doing well on Protonix 40 mg twice daily. He requires twice a day and therapy for control symptoms. No longer hoarse. Takes MiraLax daily for constipation.  Unfortunately, patient is gained 3 pounds since his last office visit and not lost as recommended previously.  Past Medical History  Diagnosis Date  . Chronic atrial fibrillation   . COPD (chronic obstructive pulmonary disease)   . Hiatal hernia   . Abdominal aortic aneurysm without rupture   . Nephrolithiasis   . Liver cyst     Benign by liver biopsy September 2013  . Bilateral renal cysts   . Sleep apnea     Uses CPAP  . Reflux esophagitis   . Tubular adenoma   . Diverticulosis     Pancolonic  . Hemorrhoids   . Essential hypertension   . Hypercholesteremia   . BPH (benign prostatic hyperplasia)   . Pre-diabetes     Past Surgical History  Procedure Laterality Date  . Hernia repair      x2, bilateral inguinal   . Kidney stone surgery    . Knee arthroscopy      X2  . Colonoscopy      2003, no polyps per patient. Dr. West Carbo  . Nasal endoscopy    . Esophagogastroduodenoscopy  11/01/11    Dr. Gala Romney- erosive reflux esophagitis along with a patulous EG junction, hialtal hernia, antral erosions with possible area of healing ulceration- s/p bx= chronic erosive gastritis, no malignancy  . Colonoscopy  11/01/11    Dr. Gala Romney- tubular adenoma,suboptimal preparation, internal hemorrhoids, o/w normal rectum. pancolonic diverticulosis    Prior to Admission medications   Medication Sig Start Date End Date Taking? Authorizing Provider  amLODipine (NORVASC) 5 MG tablet TAKE ONE TABLET BY MOUTH ONCE DAILY 11/09/13  Yes Lendon Colonel, NP  aspirin EC 81 MG tablet Take 81 mg by mouth daily.   Yes Historical  Provider, MD  benazepril (LOTENSIN) 40 MG tablet TAKE ONE TABLET BY MOUTH ONCE DAILY 01/04/14  Yes Satira Sark, MD  finasteride (PROSCAR) 5 MG tablet Take 5 mg by mouth daily.   Yes Historical Provider, MD  FLUZONE HIGH-DOSE 0.5 ML SUSY  10/03/13  Yes Historical Provider, MD  Garlic 1779 MG CAPS Take 1,000 mg by mouth daily.   Yes Historical Provider, MD  losartan-hydrochlorothiazide (HYZAAR) 100-25 MG per tablet TAKE ONE TABLET BY MOUTH ONCE DAILY **NEEDS  APPOINTMENT  FOR  FURTHER  REFILLS** 01/04/14  Yes Satira Sark, MD  metoprolol (LOPRESSOR) 100 MG tablet Take 0.5 tablets (50 mg total) by mouth daily. 02/17/13  Yes Lendon Colonel, NP  oxybutynin (DITROPAN-XL) 10 MG 24 hr tablet Take 10 mg by mouth daily.   Yes Historical Provider, MD  pantoprazole (PROTONIX) 40 MG tablet Take 1 tablet (40 mg total) by mouth 2 (two) times daily. 08/24/13  Yes Orvil Feil, NP  polyethylene glycol (MIRALAX / GLYCOLAX) packet Take 17 g by mouth daily. As needed   Yes Historical Provider, MD  potassium chloride SA (KLOR-CON M20) 20 MEQ tablet Take 1 tablet (20 mEq total) by mouth daily. 03/26/13  Yes Lendon Colonel, NP  rosuvastatin (CRESTOR) 10 MG tablet Take 1 tablet (10 mg total) by mouth daily. 02/17/13  Yes Lendon Colonel, NP  Tamsulosin  HCl (FLOMAX) 0.4 MG CAPS Take 0.4 mg by mouth daily.   Yes Historical Provider, MD  warfarin (COUMADIN) 5 MG tablet Take 1-1.5 tablets (5-7.5 mg total) by mouth as directed. Patient takes 1 tablet daily except 1 1/2 tablets on Mondays and Fridays 07/13/13  Yes Josue Hector, MD  Wheat Dextrin (BENEFIBER PO) Take by mouth. As needed   Yes Historical Provider, MD    Allergies as of 04/15/2014 - Review Complete 11/19/2013  Allergen Reaction Noted  . Latex Rash 10/04/2011    Family History  Problem Relation Age of Onset  . Colon cancer Neg Hx   . Liver disease Neg Hx   . Breast cancer Mother   . Heart disease Father     Pacemaker    History   Social  History  . Marital Status: Married    Spouse Name: N/A  . Number of Children: 3  . Years of Education: N/A   Occupational History  .     Social History Main Topics  . Smoking status: Former Smoker -- 0.50 packs/day    Types: Cigarettes  . Smokeless tobacco: Never Used     Comment: quit about 10 years  . Alcohol Use: No  . Drug Use: No  . Sexual Activity: Not on file   Other Topics Concern  . Not on file   Social History Narrative    Review of Systems: See HPI, otherwise negative ROS  Physical Exam: BP 135/72 mmHg  Pulse 70  Temp(Src) 98.2 F (36.8 C) (Oral)  Ht 5' 8.5" (1.74 m)  Wt 241 lb 3.2 oz (109.408 kg)  BMI 36.14 kg/m2 General:   Alert,  Well-developed, well-nourished, pleasant and cooperative in NAD Skin:  Intact without significant lesions or rashes. Eyes:  Sclera clear, no icterus.   Conjunctiva pink. Ears:  Normal auditory acuity. Nose:  No deformity, discharge,  or lesions. Mouth:  No deformity or lesions. Neck:  Supple; no masses or thyromegaly. No significant cervical adenopathy. Lungs:  Clear throughout to auscultation.   No wheezes, crackles, or rhonchi. No acute distress. Heart:  irregularly irregular rhythm; no murmurs, clicks, rubs,  or gallops. Abdomen: Non-distended, normal bowel sounds.  Soft and nontender without appreciable mass or hepatosplenomegaly.  Pulses:  Normal pulses noted. Extremities:  Without clubbing or edema.  Impression:    GERD now well controlled on twice a day Protonix. He is significantly over his ideal body weight. As long as he remains at this weight, I doubt we can drop back to once daily therapy. Discussed long-term acid suppression therapy. I feel the benefits here outweigh the risks. Constipation well controlled on MiraLax.  Recommendations:   GERD information provided.  Try and loose some weight. Continue protonix twice daily.  Continue miralax as needed for constipation. Office visit with [place] in 1  year      Notice: This dictation  as prepared with Dragon dictation along with smaller phrase technology. Any transcriptional errors that result from this process are unintentional and may not be corrected upon review.

## 2014-05-05 ENCOUNTER — Ambulatory Visit (INDEPENDENT_AMBULATORY_CARE_PROVIDER_SITE_OTHER): Payer: Medicare PPO | Admitting: *Deleted

## 2014-05-05 DIAGNOSIS — I482 Chronic atrial fibrillation, unspecified: Secondary | ICD-10-CM

## 2014-05-05 DIAGNOSIS — Z7901 Long term (current) use of anticoagulants: Secondary | ICD-10-CM

## 2014-05-05 DIAGNOSIS — Z5181 Encounter for therapeutic drug level monitoring: Secondary | ICD-10-CM

## 2014-05-05 DIAGNOSIS — I4891 Unspecified atrial fibrillation: Secondary | ICD-10-CM | POA: Diagnosis not present

## 2014-05-05 LAB — POCT INR: INR: 2.7

## 2014-06-09 ENCOUNTER — Ambulatory Visit (INDEPENDENT_AMBULATORY_CARE_PROVIDER_SITE_OTHER): Payer: Medicare PPO | Admitting: Cardiology

## 2014-06-09 ENCOUNTER — Encounter: Payer: Self-pay | Admitting: Cardiology

## 2014-06-09 ENCOUNTER — Ambulatory Visit (INDEPENDENT_AMBULATORY_CARE_PROVIDER_SITE_OTHER): Payer: Medicare PPO | Admitting: *Deleted

## 2014-06-09 VITALS — BP 146/70 | HR 70 | Ht 69.0 in | Wt 239.0 lb

## 2014-06-09 DIAGNOSIS — Z5181 Encounter for therapeutic drug level monitoring: Secondary | ICD-10-CM | POA: Diagnosis not present

## 2014-06-09 DIAGNOSIS — I482 Chronic atrial fibrillation, unspecified: Secondary | ICD-10-CM

## 2014-06-09 DIAGNOSIS — Z7901 Long term (current) use of anticoagulants: Secondary | ICD-10-CM

## 2014-06-09 DIAGNOSIS — I714 Abdominal aortic aneurysm, without rupture, unspecified: Secondary | ICD-10-CM

## 2014-06-09 DIAGNOSIS — I10 Essential (primary) hypertension: Secondary | ICD-10-CM | POA: Diagnosis not present

## 2014-06-09 DIAGNOSIS — I4891 Unspecified atrial fibrillation: Secondary | ICD-10-CM

## 2014-06-09 LAB — POCT INR: INR: 2.6

## 2014-06-09 MED ORDER — LOSARTAN POTASSIUM-HCTZ 100-25 MG PO TABS: 1.0000 | ORAL_TABLET | Freq: Every day | ORAL | Status: DC

## 2014-06-09 NOTE — Progress Notes (Signed)
Cardiology Office Note  Date: 06/09/2014   ID: Tristan Lopez, DOB 1930/05/17, MRN [id number]  PCP: [name], MD  Primary Cardiologist: [name], MD   Chief Complaint  Patient presents with  . Atrial Fibrillation  . AAA    History of Present Illness: Tristan Lopez is an 81 y.o. male last seen in October 2015. He presents with his wife today for a routine follow-up visit. From a cardiac perspective, he does not report any significant palpitations or chest pain. We reviewed his medications which are outlined below, he continues on Lopressor and Coumadin.  He has had no abdominal pain, previous history of abdominal aortic aneurysm as outlined below, last assessed in February 2015.  He continues to follow with Dr. Anastasio Champion.   Past Medical History  Diagnosis Date  . Chronic atrial fibrillation   . COPD (chronic obstructive pulmonary disease)   . Hiatal hernia   . Abdominal aortic aneurysm without rupture   . Nephrolithiasis   . Liver cyst     Benign by liver biopsy September 2013  . Bilateral renal cysts   . Sleep apnea     Uses CPAP  . Reflux esophagitis   . Tubular adenoma   . Diverticulosis     Pancolonic  . Hemorrhoids   . Essential hypertension   . Hypercholesteremia   . BPH (benign prostatic hyperplasia)   . Pre-diabetes     Past Surgical History  Procedure Laterality Date  . Hernia repair      x2, bilateral inguinal   . Kidney stone surgery    . Knee arthroscopy      X2  . Colonoscopy      2003, no polyps per patient. Dr. West Carbo  . Nasal endoscopy    . Esophagogastroduodenoscopy  11/01/11    Dr. Gala Romney- erosive reflux esophagitis along with a patulous EG junction, hialtal hernia, antral erosions with possible area of healing ulceration- s/p bx= chronic erosive gastritis, no malignancy  . Colonoscopy  11/01/11    Dr. Gala Romney- tubular adenoma,suboptimal preparation, internal hemorrhoids, o/w normal rectum. pancolonic diverticulosis     Current Outpatient Prescriptions  Medication Sig Dispense Refill  . amLODipine (NORVASC) 5 MG tablet TAKE ONE TABLET BY MOUTH ONCE DAILY 90 tablet 0  . aspirin EC 81 MG tablet Take 81 mg by mouth daily.    . benazepril (LOTENSIN) 40 MG tablet TAKE ONE TABLET BY MOUTH ONCE DAILY 90 tablet 3  . finasteride (PROSCAR) 5 MG tablet Take 5 mg by mouth daily.    . Garlic 8182 MG CAPS Take 1,000 mg by mouth daily.    Marland Kitchen losartan-hydrochlorothiazide (HYZAAR) 100-25 MG per tablet Take 1 tablet by mouth daily. 90 tablet 3  . metoprolol (LOPRESSOR) 100 MG tablet Take 0.5 tablets (50 mg total) by mouth daily. 90 tablet 1  . oxybutynin (DITROPAN-XL) 10 MG 24 hr tablet Take 10 mg by mouth daily.    . pantoprazole (PROTONIX) 40 MG tablet Take 1 tablet (40 mg total) by mouth 2 (two) times daily. 180 tablet 1  . polyethylene glycol (MIRALAX / GLYCOLAX) packet Take 17 g by mouth daily. As needed    . potassium chloride SA (KLOR-CON M20) 20 MEQ tablet Take 1 tablet (20 mEq total) by mouth daily. 90 tablet 6  . rosuvastatin (CRESTOR) 10 MG tablet Take 1 tablet (10 mg total) by mouth daily. 90 tablet 1  . Tamsulosin HCl (FLOMAX) 0.4 MG CAPS Take 0.4 mg by mouth daily.    Marland Kitchen  warfarin (COUMADIN) 5 MG tablet Take 1-1.5 tablets (5-7.5 mg total) by mouth as directed. Patient takes 1 tablet daily except 1 1/2 tablets on Mondays and Fridays 100 tablet 3  . Wheat Dextrin (BENEFIBER PO) Take by mouth. As needed    . FLUZONE HIGH-DOSE 0.5 ML SUSY      No current facility-administered medications for this visit.    Allergies:  Latex   Social History: The patient  reports that he has quit smoking. His smoking use included Cigarettes. He smoked 0.50 packs per day. He has never used smokeless tobacco. He reports that he does not drink alcohol or use illicit drugs.   ROS:  Please see the history of present illness. Otherwise, complete review of systems is positive for slowing down somewhat over the years, but still tries to  stay as active as he can.  All other systems are reviewed and negative.   Physical Exam: VS:  BP 146/70 mmHg  Pulse 70  Ht 5\' 9"  (1.753 m)  Wt 239 lb (108.41 kg)  BMI 35.28 kg/m2  SpO2 99%, BMI Body mass index is 35.28 kg/(m^2).  Wt Readings from Last 3 Encounters:  06/09/14 239 lb (108.41 kg)  04/15/14 241 lb 3.2 oz (109.408 kg)  11/19/13 242 lb (109.77 kg)    Overweight male, appears comfortable at rest. HEENT: Conjunctiva and lids normal, oropharynx clear. Neck: Supple, no elevated JVP or carotid bruits, no thyromegaly. Lungs: Clear to auscultation, nonlabored breathing at rest. Cardiac: Irregularly irregular, no S3, soft systolic murmur, no pericardial rub. Abdomen: Soft, nontender, bowel sounds present, no guarding or rebound. No bruit. Extremities: Trace edema, distal pulses 2+. Skin: Warm and dry. Musculoskeletal: No kyphosis. Neuropsychiatric: Alert and oriented x3, affect grossly appropriate.   ECG: ECG is not ordered today.  Recent Labwork: No results found for requested labs within last 365 days.     Component Value Date/Time   CHOL 147 02/17/2013 0820   TRIG 86 02/17/2013 0820   HDL 50 02/17/2013 0820   CHOLHDL 2.9 02/17/2013 0820   VLDL 17 02/17/2013 0820   [place] 80 02/17/2013 0820    Other Studies Reviewed Today:  Echocardiogram from July 2014 reported moderate LVH with LVEF 55-60%, aortic valve sclerosis without stenosis, moderate left atrial enlargement, mild tricuspid regurgitation, PASP 38 mmHg.  CT of the abdomen and pelvis back in February 2015 showed a 3.5 cm infrarenal abdominal aortic aneurysm with minimal change compared to 2013.  ASSESSMENT AND PLAN:  1. Chronic atrial fibrillation, asymptomatic. Continue strategy of heart rate control and anticoagulation. No medication adjustments made today.  2. 3.5 cm abdominal aortic aneurysm by assessment in February 2015. Follow-up abdominal ultrasound will be obtained.  3. Essential hypertension,  keep follow-up with Dr. Anastasio Champion.  Current medicines were reviewed at length with the patient today.   Orders Placed This Encounter  Procedures  . US Aorta    Disposition: FU with me in 8 months.   Signed, Satira Sark, MD, [facility] 06/09/2014 1:44 PM    [place] at [facility] [address] [place], [place] [address] Phone: [phone]; Fax: [phone]

## 2014-06-09 NOTE — Patient Instructions (Addendum)
Your physician wants you to follow-up in: 8 months with Dr Ferne Reus will receive a reminder letter in the mail two months in advance. If you don't receive a letter, please call our office to schedule the follow-up appointment.   Your physician recommends that you continue on your current medications as directed. Please refer to the Current Medication list given to you today.  Please schedule your abdominal ultrasound Thank you for choosing [place] !

## 2014-06-16 ENCOUNTER — Ambulatory Visit (HOSPITAL_COMMUNITY)
Admission: RE | Admit: 2014-06-16 | Discharge: 2014-06-16 | Disposition: A | Discharge status: Home / Self Care | Payer: Medicare PPO | Source: Ambulatory Visit | Attending: Cardiology | Admitting: Cardiology

## 2014-06-16 DIAGNOSIS — I714 Abdominal aortic aneurysm, without rupture, unspecified: Secondary | ICD-10-CM

## 2014-06-22 ENCOUNTER — Other Ambulatory Visit: Payer: Self-pay | Admitting: Adult Health

## 2014-07-21 ENCOUNTER — Ambulatory Visit (INDEPENDENT_AMBULATORY_CARE_PROVIDER_SITE_OTHER): Payer: Medicare PPO | Admitting: *Deleted

## 2014-07-21 DIAGNOSIS — Z5181 Encounter for therapeutic drug level monitoring: Secondary | ICD-10-CM

## 2014-07-21 DIAGNOSIS — I482 Chronic atrial fibrillation, unspecified: Secondary | ICD-10-CM

## 2014-07-21 DIAGNOSIS — Z7901 Long term (current) use of anticoagulants: Secondary | ICD-10-CM

## 2014-07-21 DIAGNOSIS — I4891 Unspecified atrial fibrillation: Secondary | ICD-10-CM

## 2014-07-21 LAB — POCT INR: INR: 3

## 2014-09-01 ENCOUNTER — Ambulatory Visit (INDEPENDENT_AMBULATORY_CARE_PROVIDER_SITE_OTHER): Payer: Medicare PPO | Admitting: *Deleted

## 2014-09-01 DIAGNOSIS — Z7901 Long term (current) use of anticoagulants: Secondary | ICD-10-CM

## 2014-09-01 DIAGNOSIS — I4891 Unspecified atrial fibrillation: Secondary | ICD-10-CM

## 2014-09-01 DIAGNOSIS — I482 Chronic atrial fibrillation, unspecified: Secondary | ICD-10-CM

## 2014-09-01 DIAGNOSIS — Z5181 Encounter for therapeutic drug level monitoring: Secondary | ICD-10-CM | POA: Diagnosis not present

## 2014-09-01 LAB — POCT INR: INR: 2.7

## 2014-10-13 ENCOUNTER — Ambulatory Visit (INDEPENDENT_AMBULATORY_CARE_PROVIDER_SITE_OTHER): Payer: Medicare PPO | Admitting: *Deleted

## 2014-10-13 DIAGNOSIS — I482 Chronic atrial fibrillation, unspecified: Secondary | ICD-10-CM

## 2014-10-13 DIAGNOSIS — Z5181 Encounter for therapeutic drug level monitoring: Secondary | ICD-10-CM

## 2014-10-13 DIAGNOSIS — I4891 Unspecified atrial fibrillation: Secondary | ICD-10-CM | POA: Diagnosis not present

## 2014-10-13 DIAGNOSIS — Z7901 Long term (current) use of anticoagulants: Secondary | ICD-10-CM

## 2014-10-13 LAB — POCT INR: INR: 3.5

## 2014-11-08 ENCOUNTER — Ambulatory Visit (INDEPENDENT_AMBULATORY_CARE_PROVIDER_SITE_OTHER): Payer: Medicare PPO | Admitting: *Deleted

## 2014-11-08 DIAGNOSIS — Z5181 Encounter for therapeutic drug level monitoring: Secondary | ICD-10-CM

## 2014-11-08 DIAGNOSIS — Z7901 Long term (current) use of anticoagulants: Secondary | ICD-10-CM

## 2014-11-08 DIAGNOSIS — I4891 Unspecified atrial fibrillation: Secondary | ICD-10-CM

## 2014-11-08 DIAGNOSIS — I482 Chronic atrial fibrillation, unspecified: Secondary | ICD-10-CM

## 2014-11-08 LAB — POCT INR: INR: 3.5

## 2014-11-29 ENCOUNTER — Ambulatory Visit (INDEPENDENT_AMBULATORY_CARE_PROVIDER_SITE_OTHER): Payer: Medicare PPO | Admitting: *Deleted

## 2014-11-29 DIAGNOSIS — Z7901 Long term (current) use of anticoagulants: Secondary | ICD-10-CM

## 2014-11-29 DIAGNOSIS — I482 Chronic atrial fibrillation, unspecified: Secondary | ICD-10-CM

## 2014-11-29 DIAGNOSIS — Z5181 Encounter for therapeutic drug level monitoring: Secondary | ICD-10-CM

## 2014-11-29 DIAGNOSIS — I4891 Unspecified atrial fibrillation: Secondary | ICD-10-CM | POA: Diagnosis not present

## 2014-11-29 LAB — POCT INR: INR: 2.6

## 2014-12-02 ENCOUNTER — Emergency Department (HOSPITAL_COMMUNITY)
Admission: EM | Admit: 2014-12-02 | Discharge: 2014-12-03 | Disposition: A | Discharge status: Home / Self Care | Payer: Medicare PPO | Attending: Emergency Medicine | Admitting: Emergency Medicine

## 2014-12-02 ENCOUNTER — Emergency Department (HOSPITAL_COMMUNITY): Payer: Medicare PPO

## 2014-12-02 ENCOUNTER — Encounter (HOSPITAL_COMMUNITY): Payer: Self-pay | Admitting: Emergency Medicine

## 2014-12-02 DIAGNOSIS — Z9981 Dependence on supplemental oxygen: Secondary | ICD-10-CM | POA: Insufficient documentation

## 2014-12-02 DIAGNOSIS — Z87442 Personal history of urinary calculi: Secondary | ICD-10-CM | POA: Diagnosis not present

## 2014-12-02 DIAGNOSIS — N400 Enlarged prostate without lower urinary tract symptoms: Secondary | ICD-10-CM | POA: Diagnosis not present

## 2014-12-02 DIAGNOSIS — R51 Headache: Secondary | ICD-10-CM | POA: Diagnosis present

## 2014-12-02 DIAGNOSIS — I10 Essential (primary) hypertension: Secondary | ICD-10-CM | POA: Insufficient documentation

## 2014-12-02 DIAGNOSIS — E7800 Pure hypercholesterolemia, unspecified: Secondary | ICD-10-CM | POA: Insufficient documentation

## 2014-12-02 DIAGNOSIS — G4730 Sleep apnea, unspecified: Secondary | ICD-10-CM | POA: Insufficient documentation

## 2014-12-02 DIAGNOSIS — Z91040 Latex allergy status: Secondary | ICD-10-CM | POA: Insufficient documentation

## 2014-12-02 DIAGNOSIS — R251 Tremor, unspecified: Secondary | ICD-10-CM | POA: Insufficient documentation

## 2014-12-02 DIAGNOSIS — I482 Chronic atrial fibrillation: Secondary | ICD-10-CM | POA: Insufficient documentation

## 2014-12-02 DIAGNOSIS — Z7982 Long term (current) use of aspirin: Secondary | ICD-10-CM | POA: Diagnosis not present

## 2014-12-02 DIAGNOSIS — Z7901 Long term (current) use of anticoagulants: Secondary | ICD-10-CM | POA: Insufficient documentation

## 2014-12-02 DIAGNOSIS — M26621 Arthralgia of right temporomandibular joint: Secondary | ICD-10-CM

## 2014-12-02 DIAGNOSIS — Z87891 Personal history of nicotine dependence: Secondary | ICD-10-CM | POA: Diagnosis not present

## 2014-12-02 DIAGNOSIS — Z86018 Personal history of other benign neoplasm: Secondary | ICD-10-CM | POA: Insufficient documentation

## 2014-12-02 DIAGNOSIS — J449 Chronic obstructive pulmonary disease, unspecified: Secondary | ICD-10-CM | POA: Diagnosis not present

## 2014-12-02 DIAGNOSIS — Z79899 Other long term (current) drug therapy: Secondary | ICD-10-CM | POA: Diagnosis not present

## 2014-12-02 DIAGNOSIS — Z8719 Personal history of other diseases of the digestive system: Secondary | ICD-10-CM | POA: Diagnosis not present

## 2014-12-02 LAB — CBC WITH DIFFERENTIAL/PLATELET
Basophils Absolute: 0 10*3/uL (ref 0.0–0.1)
Basophils Relative: 0 %
EOS ABS: 0.1 10*3/uL (ref 0.0–0.7)
Eosinophils Relative: 1 %
HEMATOCRIT: 40.6 % (ref 39.0–52.0)
HEMOGLOBIN: 13.2 g/dL (ref 13.0–17.0)
LYMPHS ABS: 1.1 10*3/uL (ref 0.7–4.0)
Lymphocytes Relative: 13 %
MCH: 30 pg (ref 26.0–34.0)
MCHC: 32.5 g/dL (ref 30.0–36.0)
MCV: 92.3 fL (ref 78.0–100.0)
MONO ABS: 0.9 10*3/uL (ref 0.1–1.0)
Monocytes Relative: 10 %
NEUTROS PCT: 76 %
Neutro Abs: 6.7 10*3/uL (ref 1.7–7.7)
Platelets: 193 10*3/uL (ref 150–400)
RBC: 4.4 MIL/uL (ref 4.22–5.81)
RDW: 14.2 % (ref 11.5–15.5)
WBC: 8.8 10*3/uL (ref 4.0–10.5)

## 2014-12-02 LAB — COMPREHENSIVE METABOLIC PANEL
ALT: 16 U/L — ABNORMAL LOW (ref 17–63)
ANION GAP: 11 (ref 5–15)
AST: 23 U/L (ref 15–41)
Albumin: 4.4 g/dL (ref 3.5–5.0)
Alkaline Phosphatase: 58 U/L (ref 38–126)
BILIRUBIN TOTAL: 1.2 mg/dL (ref 0.3–1.2)
BUN: 20 mg/dL (ref 6–20)
CO2: 27 mmol/L (ref 22–32)
Calcium: 9.7 mg/dL (ref 8.9–10.3)
Chloride: 101 mmol/L (ref 101–111)
Creatinine, Ser: 0.97 mg/dL (ref 0.61–1.24)
GFR calc non Af Amer: >60 mL/min (ref >60)
Glucose, Bld: 116 mg/dL — ABNORMAL HIGH (ref 65–99)
Potassium: 4 mmol/L (ref 3.5–5.1)
SODIUM: 139 mmol/L (ref 135–145)
TOTAL PROTEIN: 8.2 g/dL — AB (ref 6.5–8.1)

## 2014-12-02 LAB — PROTIME-INR
INR: 1.88 — AB (ref 0.00–1.49)
Prothrombin Time: 21.5 seconds — ABNORMAL HIGH (ref 11.6–15.2)

## 2014-12-02 LAB — SEDIMENTATION RATE: SED RATE: 45 mm/h — AB (ref 0–16)

## 2014-12-02 MED ORDER — PREDNISONE 50 MG PO TABS
60.0000 mg | ORAL_TABLET | Freq: Once | ORAL | Status: AC
  Administered 2014-12-02: 60 mg via ORAL
  Filled 2014-12-02 (×2): qty 1

## 2014-12-02 MED ORDER — DIAZEPAM 2 MG PO TABS
2.0000 mg | ORAL_TABLET | Freq: Once | ORAL | Status: AC
  Administered 2014-12-02: 2 mg via ORAL
  Filled 2014-12-02: qty 1

## 2014-12-02 MED ORDER — HYDROCODONE-ACETAMINOPHEN 5-325 MG PO TABS
1.0000 | ORAL_TABLET | Freq: Once | ORAL | Status: AC
  Administered 2014-12-02: 1 via ORAL
  Filled 2014-12-02: qty 1

## 2014-12-02 MED ORDER — PREDNISONE 50 MG PO TABS: ORAL_TABLET | ORAL | Status: DC

## 2014-12-02 MED ORDER — HYDROCODONE-ACETAMINOPHEN 5-325 MG PO TABS: 1.0000 | ORAL_TABLET | ORAL | Status: DC | PRN

## 2014-12-02 NOTE — Discharge Instructions (Signed)
Gout As we discussed this may represent gout. Take the steroids and pain medication as prescribed. Follow-up with your doctor. Return to the ED if you develop new or worsening symptoms. Gout is an inflammatory arthritis caused by a buildup of uric acid crystals in the joints. Uric acid is a chemical that is normally present in the blood. When the level of uric acid in the blood is too high it can form crystals that deposit in your joints and tissues. This causes joint redness, soreness, and swelling (inflammation). Repeat attacks are common. Over time, uric acid crystals can form into masses (tophi) near a joint, destroying bone and causing disfigurement. Gout is treatable and often preventable. CAUSES  The disease begins with elevated levels of uric acid in the blood. Uric acid is produced by your body when it breaks down a naturally found substance called purines. Certain foods you eat, such as meats and fish, contain high amounts of purines. Causes of an elevated uric acid level include:  Being passed down from parent to child (heredity).  Diseases that cause increased uric acid production (such as obesity, psoriasis, and certain cancers).  Excessive alcohol use.  Diet, especially diets rich in meat and seafood.  Medicines, including certain cancer-fighting medicines (chemotherapy), water pills (diuretics), and aspirin.  Chronic kidney disease. The kidneys are no longer able to remove uric acid well.  Problems with metabolism. Conditions strongly associated with gout include:  Obesity.  High blood pressure.  High cholesterol.  Diabetes. Not everyone with elevated uric acid levels gets gout. It is not understood why some people get gout and others do not. Surgery, joint injury, and eating too much of certain foods are some of the factors that can lead to gout attacks. SYMPTOMS   An attack of gout comes on quickly. It causes intense pain with redness, swelling, and warmth in a  joint.  Fever can occur.  Often, only one joint is involved. Certain joints are more commonly involved:  Base of the big toe.  Knee.  Ankle.  Wrist.  Finger. Without treatment, an attack usually goes away in a few days to weeks. Between attacks, you usually will not have symptoms, which is different from many other forms of arthritis. DIAGNOSIS  Your caregiver will suspect gout based on your symptoms and exam. In some cases, tests may be recommended. The tests may include:  Blood tests.  Urine tests.  X-rays.  Joint fluid exam. This exam requires a needle to remove fluid from the joint (arthrocentesis). Using a microscope, gout is confirmed when uric acid crystals are seen in the joint fluid. TREATMENT  There are two phases to gout treatment: treating the sudden onset (acute) attack and preventing attacks (prophylaxis).  Treatment of an Acute Attack.  Medicines are used. These include anti-inflammatory medicines or steroid medicines.  An injection of steroid medicine into the affected joint is sometimes necessary.  The painful joint is rested. Movement can worsen the arthritis.  You may use warm or cold treatments on painful joints, depending which works best for you.  Treatment to Prevent Attacks.  If you suffer from frequent gout attacks, your caregiver may advise preventive medicine. These medicines are started after the acute attack subsides. These medicines either help your kidneys eliminate uric acid from your body or decrease your uric acid production. You may need to stay on these medicines for a very long time.  The early phase of treatment with preventive medicine can be associated with an increase in acute  gout attacks. For this reason, during the first few months of treatment, your caregiver may also advise you to take medicines usually used for acute gout treatment. Be sure you understand your caregiver's directions. Your caregiver may make several adjustments  to your medicine dose before these medicines are effective.  Discuss dietary treatment with your caregiver or dietitian. Alcohol and drinks high in sugar and fructose and foods such as meat, poultry, and seafood can increase uric acid levels. Your caregiver or dietitian can advise you on drinks and foods that should be limited. HOME CARE INSTRUCTIONS   Do not take aspirin to relieve pain. This raises uric acid levels.  Only take over-the-counter or prescription medicines for pain, discomfort, or fever as directed by your caregiver.  Rest the joint as much as possible. When in bed, keep sheets and blankets off painful areas.  Keep the affected joint raised (elevated).  Apply warm or cold treatments to painful joints. Use of warm or cold treatments depends on which works best for you.  Use crutches if the painful joint is in your leg.  Drink enough fluids to keep your urine clear or pale yellow. This helps your body get rid of uric acid. Limit alcohol, sugary drinks, and fructose drinks.  Follow your dietary instructions. Pay careful attention to the amount of protein you eat. Your daily diet should emphasize fruits, vegetables, whole grains, and fat-free or low-fat milk products. Discuss the use of coffee, vitamin C, and cherries with your caregiver or dietitian. These may be helpful in lowering uric acid levels.  Maintain a healthy body weight. SEEK MEDICAL CARE IF:   You develop diarrhea, vomiting, or any side effects from medicines.  You do not feel better in 24 hours, or you are getting worse. SEEK IMMEDIATE MEDICAL CARE IF:   Your joint becomes suddenly more tender, and you have chills or a fever. MAKE SURE YOU:   Understand these instructions.  Will watch your condition.  Will get help right away if you are not doing well or get worse.   This information is not intended to replace advice given to you by your health care provider. Make sure you discuss any questions you have  with your health care provider.   Document Released: 01/06/2000 Document Revised: 01/29/2014 Document Reviewed: 08/22/2011 Elsevier Interactive Patient Education [employer].

## 2014-12-02 NOTE — ED Notes (Signed)
Pt states facial pain started this morning around 0800, but has become worse this evening.

## 2014-12-02 NOTE — ED Notes (Signed)
Patient complaining of severe pain to right side of his face since this morning, worsening around 1530. Patient is unable to get comfortable and has severe spasms during triage.

## 2014-12-02 NOTE — ED Notes (Signed)
Patient stated that he walked to restroom with assistance of son. Upon returning to room patient states that pain has decreased. Patient resting on stretcher at this time. No distress noted.

## 2014-12-02 NOTE — ED Notes (Signed)
Pt resting when entered the room. States facial pain is much better at this time. Informed waiting on lab work.

## 2014-12-02 NOTE — ED Provider Notes (Signed)
CSN: [id number]     Arrival date & time 12/02/14  1929 History  By signing my name below, I, Jolayne Panther, attest that this documentation has been prepared under the direction and in the presence of Ezequiel Essex, MD. Electronically Signed: Jolayne Panther, Scribe. 12/02/2014. 8:36 PM.    Chief Complaint  Patient presents with  . Facial Pain    The history is provided by the patient. No language interpreter was used.    HPI Comments: Tristan Lopez is a 81 y.o. male who presents to the Emergency Department complaining of facial pain beginning this morning around 8 AM that worsened around 4:00 PM today. Pt reports pain is  worsened when moving his jaw and mouth, and he reports associated constant, ear pain. He states that he took two Tylenol at home today with little to no relief. He states he has never had this issue before and he reports that he occasionally wears glasses. Pt has a tremor but today it is not any worse than normal. He denies HA, fever, visual disturbances, chest pain, abdominal pain, tooth pain, and neck pain. Pt reports he is on Coumadin. Pt has a Hx of HTN, prostate problems, and  AFIB. Pt denies Hx of stroke and DM. He is allergic to latex but has NKDA otherwise.   Past Medical History  Diagnosis Date  . Chronic atrial fibrillation ([place])   . COPD (chronic obstructive pulmonary disease) ([place])   . Hiatal hernia   . Abdominal aortic aneurysm without rupture ([place])   . Nephrolithiasis   . Liver cyst     Benign by liver biopsy September 2013  . Bilateral renal cysts   . Sleep apnea     Uses CPAP  . Reflux esophagitis   . Tubular adenoma   . Diverticulosis     Pancolonic  . Hemorrhoids   . Essential hypertension   . Hypercholesteremia   . BPH (benign prostatic hyperplasia)   . Pre-diabetes    Past Surgical History  Procedure Laterality Date  . Hernia repair      x2, bilateral inguinal   . Kidney stone surgery    . Knee arthroscopy      X2  .  Colonoscopy      2003, no polyps per patient. Dr. West Carbo  . Nasal endoscopy    . Esophagogastroduodenoscopy  11/01/11    Dr. Gala Romney- erosive reflux esophagitis along with a patulous EG junction, hialtal hernia, antral erosions with possible area of healing ulceration- s/p bx= chronic erosive gastritis, no malignancy  . Colonoscopy  11/01/11    Dr. Gala Romney- tubular adenoma,suboptimal preparation, internal hemorrhoids, o/w normal rectum. pancolonic diverticulosis   Family History  Problem Relation Age of Onset  . Colon cancer Neg Hx   . Liver disease Neg Hx   . Breast cancer Mother   . Heart disease Father     Pacemaker   Social History  Substance Use Topics  . Smoking status: Former Smoker -- 0.50 packs/day    Types: Cigarettes  . Smokeless tobacco: Never Used     Comment: quit about 10 years  . Alcohol Use: No    Review of Systems A complete 10 system review of systems was obtained and all systems are negative except as noted in the HPI and PMH.     Allergies  Latex  Home Medications   Prior to Admission medications   Medication Sig Start Date End Date Taking? Authorizing Provider  amLODipine (NORVASC) 5 MG tablet  TAKE ONE TABLET BY MOUTH ONCE DAILY 11/09/13  Yes Lendon Colonel, NP  aspirin EC 81 MG tablet Take 81 mg by mouth daily.   Yes Historical Provider, MD  benazepril (LOTENSIN) 40 MG tablet TAKE ONE TABLET BY MOUTH ONCE DAILY 01/04/14  Yes Satira Sark, MD  finasteride (PROSCAR) 5 MG tablet Take 5 mg by mouth daily.   Yes Historical Provider, MD  Garlic 1610 MG CAPS Take 1,000 mg by mouth daily.   Yes Historical Provider, MD  oxybutynin (DITROPAN-XL) 10 MG 24 hr tablet Take 10 mg by mouth daily.   Yes Historical Provider, MD  pantoprazole (PROTONIX) 40 MG tablet Take 1 tablet (40 mg total) by mouth 2 (two) times daily. 08/24/13  Yes Orvil Feil, NP  potassium chloride SA (K-DUR,KLOR-CON) 20 MEQ tablet TAKE ONE TABLET BY MOUTH ONCE DAILY 06/22/14  Yes Satira Sark, MD  propranolol (INDERAL) 20 MG tablet Take 20 mg by mouth 2 (two) times daily.   Yes Historical Provider, MD  rosuvastatin (CRESTOR) 10 MG tablet Take 1 tablet (10 mg total) by mouth daily. 02/17/13  Yes Lendon Colonel, NP  Tamsulosin HCl (FLOMAX) 0.4 MG CAPS Take 0.4 mg by mouth daily.   Yes Historical Provider, MD  warfarin (COUMADIN) 5 MG tablet Take 1-1.5 tablets (5-7.5 mg total) by mouth as directed. Patient takes 1 tablet daily except 1 1/2 tablets on Mondays and Fridays Patient taking differently: Take 5-7.5 mg by mouth as directed. Patient takes 1 tablet daily except 1 1/2 tablets on Thursday 07/13/13  Yes Josue Hector, MD  HYDROcodone-acetaminophen (NORCO/VICODIN) 5-325 MG tablet Take 1 tablet by mouth every 4 (four) hours as needed. 12/02/14   Ezequiel Essex, MD  polyethylene glycol (MIRALAX / Floria Raveling) packet Take 17 g by mouth daily as needed for mild constipation. As needed    Historical Provider, MD  predniSONE (DELTASONE) 50 MG tablet 1 tablet PO daily 12/02/14   Ezequiel Essex, MD  Wheat Dextrin (BENEFIBER PO) Take 1 scoop by mouth daily as needed (constipation). As needed    Historical Provider, MD   BP 143/85 mmHg  Pulse 79  Temp(Src) 97.9 F (36.6 C) (Oral)  Resp 22  Wt 239 lb (108.41 kg)  SpO2 100% Physical Exam  Constitutional: He is oriented to person, place, and time. He appears well-developed and well-nourished. No distress.  HENT:  Head: Normocephalic and atraumatic.  Mouth/Throat: Oropharynx is clear and moist. No oropharyngeal exudate.  Uncomfortable Holding his jaw clenched Severe tenderness to palpation of right temple and TMJ joint Slight erythema of right side of the face  Eyes: Conjunctivae and EOM are normal. Pupils are equal, round, and reactive to light.  EOMI without pain  Neck: Normal range of motion. Neck supple.  No meningismus. Full ROM of neck  Cardiovascular: Normal rate, regular rhythm, normal heart sounds and intact distal  pulses.   No murmur heard. Pulmonary/Chest: Effort normal and breath sounds normal. No respiratory distress.  Abdominal: Soft. There is no tenderness. There is no guarding.  Musculoskeletal: Normal range of motion. He exhibits no edema or tenderness.  Bilateral Upper Extremity tremor baseline  Neurological: He is alert and oriented to person, place, and time. No cranial nerve deficit. He exhibits normal muscle tone. Coordination normal.  No ataxia on finger to nose bilaterally. No pronator drift. 5/5 strength throughout. CN 2-12 intact.Equal grip strength. Sensation intact.   Skin: Skin is warm.  Psychiatric: He has a normal mood and affect. His  behavior is normal.  Nursing note and vitals reviewed.   ED Course  Procedures  DIAGNOSTIC STUDIES:    Oxygen Saturation is 100% on RA, normal by my interpretation.   COORDINATION OF CARE:  1:00 AM Will prescribe pan medications and muscle relaxer. Will order labs. Discussed treatment plan with pt at bedside and pt agreed to plan.     Labs Review Labs Reviewed  COMPREHENSIVE METABOLIC PANEL - Abnormal; Notable for the following:    Glucose, Bld 116 (*)    Total Protein 8.2 (*)    ALT 16 (*)    All other components within normal limits  SEDIMENTATION RATE - Abnormal; Notable for the following:    Sed Rate 45 (*)    All other components within normal limits  PROTIME-INR - Abnormal; Notable for the following:    Prothrombin Time 21.5 (*)    INR 1.88 (*)    All other components within normal limits  CBC WITH DIFFERENTIAL/PLATELET  C-REACTIVE PROTEIN    Imaging Review Ct Maxillofacial Wo Cm  12/02/2014  CLINICAL DATA:  Severe right-sided face pain since this morning. No known injury EXAM: CT MAXILLOFACIAL WITHOUT CONTRAST TECHNIQUE: Multidetector CT imaging of the maxillofacial structures was performed. Multiplanar CT image reconstructions were also generated. A small metallic BB was placed on the right temple in order to reliably  differentiate right from left. COMPARISON:  None. FINDINGS: There is subtle but convincing edema in the upper right masticator space, best seen in the lateral pterygoid muscle belly and around the condylar right mandible. There is anterior translation of the right temporomandibular joint which could be positional or fixed. Asymmetric ossification around the right TMJ, potentially chondrocalcinosis, without erosive change. No sinusitis. Bilateral cataract resections with otherwise unremarkable orbits. Partial intracranial imaging is negative for acute finding. There is generalized atrophy with ventriculomegaly. IMPRESSION: Edema around the right TMJ and in the upper masticator space. Question TMJ pseudogout given soft tissue mineralization. Other synovitis or myositis could have the same appearance, as would traumatic edema. Electronically Signed   By: Monte Fantasia M.D.   On: 12/02/2014 23:08   I have personally reviewed and evaluated these lab results as part of my medical decision-making.    MDM   Final diagnoses:  Arthralgia of right temporomandibular joint   Severe right-sided facial pain and spasm. Worse with attempted jaw movement. No headache or vision change. No fever.  Patient on Coumadin for history of atrial fibrillation. Patient given Valium, anti-inflammatories, pain medication.  Concern for possible temporal arteritis. Visual acuity is normal. ESR is 45.  D/w Dr. Merlene Laughter who agrees with empiric treatment with steroids for possible temporal arteritis.  CT obtained given persistent pain and asymmetry on exam. There is edema of the right TMJ joint which could be myositis, seen a virus or pseudogout. Patient does have remote history of gout.  Discussed with Dr. Constance Holster of ENT. He recommends course of steroids, follow-up with oral surgery or his dentist. No fever or leukocytosis to suggest septic joint.  Patient improved and able to open jaw.  Treat for possible gout with steroids  and antiinflammatories.  Steroids will also treat possible temporal arteritis, though this now seems less likely.  Follow up with PCP and ENT.  Return to the ED with worsening pain, problems breathing or swallowing, or any other concerns.    I personally performed the services described in this documentation, which was scribed in my presence. The recorded information has been reviewed and is accurate.  Ezequiel Essex, MD 12/03/14 [phone]

## 2014-12-03 LAB — C-REACTIVE PROTEIN: CRP: 0.8 mg/dL (ref <1.0)

## 2014-12-03 MED ORDER — HYDROCODONE-ACETAMINOPHEN 5-325 MG PO TABS
1.0000 | ORAL_TABLET | Freq: Once | ORAL | Status: AC
Start: 2014-12-03 — End: 2014-12-03
  Administered 2014-12-03: 1 via ORAL

## 2014-12-03 MED ORDER — HYDROCODONE-ACETAMINOPHEN 5-325 MG PO TABS
ORAL_TABLET | ORAL | Status: AC
  Filled 2014-12-03: qty 1

## 2014-12-29 ENCOUNTER — Ambulatory Visit (INDEPENDENT_AMBULATORY_CARE_PROVIDER_SITE_OTHER): Payer: Medicare PPO | Admitting: *Deleted

## 2014-12-29 DIAGNOSIS — I4891 Unspecified atrial fibrillation: Secondary | ICD-10-CM | POA: Diagnosis not present

## 2014-12-29 DIAGNOSIS — Z7901 Long term (current) use of anticoagulants: Secondary | ICD-10-CM

## 2014-12-29 DIAGNOSIS — Z5181 Encounter for therapeutic drug level monitoring: Secondary | ICD-10-CM

## 2014-12-29 DIAGNOSIS — I482 Chronic atrial fibrillation, unspecified: Secondary | ICD-10-CM

## 2014-12-29 LAB — POCT INR: INR: 2.6

## 2015-02-02 ENCOUNTER — Ambulatory Visit (INDEPENDENT_AMBULATORY_CARE_PROVIDER_SITE_OTHER): Payer: Medicare Other | Admitting: *Deleted

## 2015-02-02 DIAGNOSIS — I4891 Unspecified atrial fibrillation: Secondary | ICD-10-CM

## 2015-02-02 DIAGNOSIS — Z5181 Encounter for therapeutic drug level monitoring: Secondary | ICD-10-CM | POA: Diagnosis not present

## 2015-02-02 DIAGNOSIS — I482 Chronic atrial fibrillation, unspecified: Secondary | ICD-10-CM

## 2015-02-02 DIAGNOSIS — Z7901 Long term (current) use of anticoagulants: Secondary | ICD-10-CM | POA: Diagnosis not present

## 2015-02-02 LAB — POCT INR: INR: 2.4

## 2015-03-16 ENCOUNTER — Ambulatory Visit (INDEPENDENT_AMBULATORY_CARE_PROVIDER_SITE_OTHER): Payer: Medicare Other | Admitting: *Deleted

## 2015-03-16 DIAGNOSIS — I482 Chronic atrial fibrillation, unspecified: Secondary | ICD-10-CM

## 2015-03-16 DIAGNOSIS — I4891 Unspecified atrial fibrillation: Secondary | ICD-10-CM | POA: Diagnosis not present

## 2015-03-16 DIAGNOSIS — Z5181 Encounter for therapeutic drug level monitoring: Secondary | ICD-10-CM

## 2015-03-16 DIAGNOSIS — Z7901 Long term (current) use of anticoagulants: Secondary | ICD-10-CM

## 2015-03-16 LAB — POCT INR: INR: 2.9

## 2015-03-25 ENCOUNTER — Encounter: Payer: Self-pay | Admitting: Internal Medicine

## 2015-03-25 ENCOUNTER — Ambulatory Visit (INDEPENDENT_AMBULATORY_CARE_PROVIDER_SITE_OTHER): Payer: Medicare Other | Admitting: Internal Medicine

## 2015-03-25 VITALS — BP 123/67 | HR 51 | Temp 97.4°F | Ht 69.0 in | Wt 240.8 lb

## 2015-03-25 DIAGNOSIS — K219 Gastro-esophageal reflux disease without esophagitis: Secondary | ICD-10-CM

## 2015-03-25 DIAGNOSIS — K5900 Constipation, unspecified: Secondary | ICD-10-CM

## 2015-03-25 DIAGNOSIS — D126 Benign neoplasm of colon, unspecified: Secondary | ICD-10-CM | POA: Diagnosis not present

## 2015-03-25 NOTE — Patient Instructions (Signed)
Take Protonix (pantoprazole) daily to twice daily as need for reflux  Ok to use a stool softener daily for constipation  Office visit in 1 year

## 2015-03-25 NOTE — Progress Notes (Signed)
Primary Care Physician:  [name], MD Primary Gastroenterologist:  Dr. Gala Romney  Pre-Procedure History & Physical: HPI:  Tristan Lopez is a 82 y.o. male here for follow-up of GERD and constipation. Doing very well taking stool softeners daily withoutanything else  -  has at least one bowel movement daily without straining. GERD symptoms well controlled on pantoprazole 40 mg twice daily. Patient has not tried backing off to once daily lately. No dysphagia. No abdominal pain. no melena. no rectal bleeding. Adenoma removed from his colon in 2013  - prep sub-optimal. He continues on anticoagulation. AAA check last year 3.5 cm;  radiologist recommended recheck 2018.  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 Also, he said some TMJ issues.  Past Medical History  Diagnosis Date  . Chronic atrial fibrillation ([place])   . COPD (chronic obstructive pulmonary disease) ([place])   . Hiatal hernia   . Abdominal aortic aneurysm without rupture ([place])   . Nephrolithiasis   . Liver cyst     Benign by liver biopsy September 2013  . Bilateral renal cysts   . Sleep apnea     Uses CPAP  . Reflux esophagitis   . Tubular adenoma   . Diverticulosis     Pancolonic  . Hemorrhoids   . Essential hypertension   . Hypercholesteremia   . BPH (benign prostatic hyperplasia)   . Pre-diabetes     Past Surgical History  Procedure Laterality Date  . Hernia repair      x2, bilateral inguinal   . Kidney stone surgery    . Knee arthroscopy      X2  . Colonoscopy      2003, no polyps per patient. Dr. West Carbo  . Nasal endoscopy    . Esophagogastroduodenoscopy  11/01/11    Dr. Gala Romney- erosive reflux esophagitis along with a patulous EG junction, hialtal hernia, antral erosions with possible area of healing ulceration- s/p bx= chronic erosive gastritis, no malignancy  . Colonoscopy  11/01/11    Dr. Gala Romney- tubular adenoma,suboptimal preparation, internal hemorrhoids,  o/w normal rectum. pancolonic diverticulosis    Prior to Admission medications   Medication Sig Start Date End Date Taking? Authorizing Provider  amLODipine (NORVASC) 5 MG tablet TAKE ONE TABLET BY MOUTH ONCE DAILY 11/09/13   Lendon Colonel, NP  aspirin EC 81 MG tablet Take 81 mg by mouth daily.    Historical Provider, MD  benazepril (LOTENSIN) 40 MG tablet TAKE ONE TABLET BY MOUTH ONCE DAILY 01/04/14   Satira Sark, MD  finasteride (PROSCAR) 5 MG tablet Take 5 mg by mouth daily.    Historical Provider, MD  Garlic [id number] MG CAPS Take 1,000 mg by mouth daily.    Historical Provider, MD  HYDROcodone-acetaminophen (NORCO/VICODIN) 5-325 MG tablet Take 1 tablet by mouth every 4 (four) hours as needed. 12/02/14   Ezequiel Essex, MD  oxybutynin (DITROPAN-XL) 10 MG 24 hr tablet Take 10 mg by mouth daily.    Historical Provider, MD  pantoprazole (PROTONIX) 40 MG tablet Take 1 tablet (40 mg total) by mouth 2 (two) times daily. 08/24/13   Orvil Feil, NP  polyethylene glycol [facility] / Floria Raveling) packet Take 17 g by mouth daily as needed for mild constipation. As needed    Historical Provider, MD  potassium chloride [place] (K-DUR,KLOR-CON) 20 MEQ tablet TAKE ONE TABLET BY MOUTH ONCE DAILY 06/22/14   Satira Sark, MD  predniSONE (DELTASONE) 50 MG tablet 1  tablet PO daily 12/02/14   Ezequiel Essex, MD  propranolol (INDERAL) 20 MG tablet Take 20 mg by mouth 2 (two) times daily.    Historical Provider, MD  rosuvastatin (CRESTOR) 10 MG tablet Take 1 tablet (10 mg total) by mouth daily. 02/17/13   Lendon Colonel, NP  Tamsulosin HCl (FLOMAX) 0.4 MG CAPS Take 0.4 mg by mouth daily.    Historical Provider, MD  warfarin (COUMADIN) 5 MG tablet Take 1-1.5 tablets (5-7.5 mg total) by mouth as directed. Patient takes 1 tablet daily except 1 1/2 tablets on Mondays and Fridays Patient taking differently: Take 5-7.5 mg by mouth as directed. Patient takes 1 tablet daily except 1 1/2 tablets on Thursday 07/13/13    Josue Hector, MD  Wheat Dextrin (BENEFIBER PO) Take 1 scoop by mouth daily as needed (constipation). As needed    Historical Provider, MD    Allergies as of 03/25/2015 - Review Complete 12/02/2014  Allergen Reaction Noted  . Latex Rash 10/04/2011    Family History  Problem Relation Age of Onset  . Colon cancer Neg Hx   . Liver disease Neg Hx   . Breast cancer Mother   . Heart disease Father     Pacemaker    Social History   Social History  . Marital Status: Married    Spouse Name: N/A  . Number of Children: 3  . Years of Education: N/A   Occupational History  .     Social History Main Topics  . Smoking status: Former Smoker -- 0.50 packs/day    Types: Cigarettes  . Smokeless tobacco: Never Used     Comment: quit about 10 years  . Alcohol Use: No  . Drug Use: No  . Sexual Activity: Not on file   Other Topics Concern  . Not on file   Social History Narrative    Review of Systems: See HPI, otherwise negative ROS  Physical Exam: There were no vitals taken for this visit. General:   Alert,  Well-developed, well-nourished, pleasant and cooperative in NAD. Accompanied by spouse. Lungs:  Clear throughout to auscultation.   No wheezes, crackles, or rhonchi. No acute distress. Heart:  Regular rate and rhythm; no murmurs, clicks, rubs,  or gallops. Abdomen: Obese.Non-distended, normal bowel sounds.  Soft and nontender without appreciable mass or hepatosplenomegaly.  Pulses:  Normal pulses noted. Extremities:  Without clubbing or edema.  Impression:   Pleasant 82 year old gentleman long-standing GERD and constipation doing well these days on the above-mentioned regimen. He has a stable, relatively small AAA;  Surveillance examination recommended 2018.  I do not feel the risks would be worth the benefits with regarding a future surveillance colonoscopy. Of course, should he develop new GI symptoms,  we would certainly reassess the need for another  examination.   Recommendations:  Take Protonix (pantoprazole) daily to twice daily as needed for reflux  Ok to use a stool softener daily for constipation  Office visit in 1 year    Notice: This dictation was prepared with Dragon dictation along with smaller phrase technology. Any transcriptional errors that result from this process are unintentional and may not be corrected upon review.

## 2015-04-01 ENCOUNTER — Emergency Department (HOSPITAL_COMMUNITY): Payer: Medicare Other

## 2015-04-01 ENCOUNTER — Encounter (HOSPITAL_COMMUNITY): Payer: Self-pay | Admitting: Emergency Medicine

## 2015-04-01 ENCOUNTER — Emergency Department (HOSPITAL_COMMUNITY)
Admission: EM | Admit: 2015-04-01 | Discharge: 2015-04-01 | Disposition: A | Discharge status: Home / Self Care | Payer: Medicare Other | Attending: Emergency Medicine | Admitting: Emergency Medicine

## 2015-04-01 DIAGNOSIS — Z7901 Long term (current) use of anticoagulants: Secondary | ICD-10-CM | POA: Insufficient documentation

## 2015-04-01 DIAGNOSIS — E7800 Pure hypercholesterolemia, unspecified: Secondary | ICD-10-CM | POA: Insufficient documentation

## 2015-04-01 DIAGNOSIS — Z79899 Other long term (current) drug therapy: Secondary | ICD-10-CM | POA: Diagnosis not present

## 2015-04-01 DIAGNOSIS — J09X2 Influenza due to identified novel influenza A virus with other respiratory manifestations: Secondary | ICD-10-CM | POA: Insufficient documentation

## 2015-04-01 DIAGNOSIS — I10 Essential (primary) hypertension: Secondary | ICD-10-CM | POA: Diagnosis not present

## 2015-04-01 DIAGNOSIS — Z87891 Personal history of nicotine dependence: Secondary | ICD-10-CM | POA: Insufficient documentation

## 2015-04-01 DIAGNOSIS — J449 Chronic obstructive pulmonary disease, unspecified: Secondary | ICD-10-CM | POA: Insufficient documentation

## 2015-04-01 DIAGNOSIS — R509 Fever, unspecified: Secondary | ICD-10-CM | POA: Diagnosis present

## 2015-04-01 DIAGNOSIS — I482 Chronic atrial fibrillation: Secondary | ICD-10-CM | POA: Diagnosis not present

## 2015-04-01 DIAGNOSIS — J101 Influenza due to other identified influenza virus with other respiratory manifestations: Secondary | ICD-10-CM

## 2015-04-01 LAB — BASIC METABOLIC PANEL
ANION GAP: 8 (ref 5–15)
BUN: 18 mg/dL (ref 6–20)
CALCIUM: 8.2 mg/dL — AB (ref 8.9–10.3)
CHLORIDE: 102 mmol/L (ref 101–111)
CO2: 26 mmol/L (ref 22–32)
Creatinine, Ser: 0.81 mg/dL (ref 0.61–1.24)
GFR calc non Af Amer: >60 mL/min (ref >60)
Glucose, Bld: 132 mg/dL — ABNORMAL HIGH (ref 65–99)
Potassium: 3.3 mmol/L — ABNORMAL LOW (ref 3.5–5.1)
SODIUM: 136 mmol/L (ref 135–145)

## 2015-04-01 LAB — CBC WITH DIFFERENTIAL/PLATELET
BASOS ABS: 0 10*3/uL (ref 0.0–0.1)
BASOS PCT: 0 %
Eosinophils Absolute: 0 10*3/uL (ref 0.0–0.7)
Eosinophils Relative: 0 %
HEMATOCRIT: 37.8 % — AB (ref 39.0–52.0)
HEMOGLOBIN: 12.2 g/dL — AB (ref 13.0–17.0)
Lymphocytes Relative: 8 %
Lymphs Abs: 0.6 10*3/uL — ABNORMAL LOW (ref 0.7–4.0)
MCH: 29.7 pg (ref 26.0–34.0)
MCHC: 32.3 g/dL (ref 30.0–36.0)
MCV: 92 fL (ref 78.0–100.0)
Monocytes Absolute: 0.8 10*3/uL (ref 0.1–1.0)
Monocytes Relative: 10 %
NEUTROS ABS: 6.5 10*3/uL (ref 1.7–7.7)
NEUTROS PCT: 82 %
Platelets: 149 10*3/uL — ABNORMAL LOW (ref 150–400)
RBC: 4.11 MIL/uL — AB (ref 4.22–5.81)
RDW: 14.3 % (ref 11.5–15.5)
WBC: 7.9 10*3/uL (ref 4.0–10.5)

## 2015-04-01 LAB — URINALYSIS, ROUTINE W REFLEX MICROSCOPIC
Bilirubin Urine: NEGATIVE
Glucose, UA: NEGATIVE mg/dL
KETONES UR: NEGATIVE mg/dL
LEUKOCYTES UA: NEGATIVE
NITRITE: NEGATIVE
PH: 6.5 (ref 5.0–8.0)
Protein, ur: 30 mg/dL — AB
SPECIFIC GRAVITY, URINE: 1.01 (ref 1.005–1.030)

## 2015-04-01 LAB — URINE MICROSCOPIC-ADD ON: WBC, UA: NONE SEEN WBC/hpf (ref 0–5)

## 2015-04-01 LAB — INFLUENZA PANEL BY PCR (TYPE A & B)
H1N1FLUPCR: NOT DETECTED
Influenza A By PCR: POSITIVE — AB
Influenza B By PCR: NEGATIVE

## 2015-04-01 LAB — LACTIC ACID, PLASMA
LACTIC ACID, VENOUS: 0.8 mmol/L (ref 0.5–2.0)
LACTIC ACID, VENOUS: 0.9 mmol/L (ref 0.5–2.0)

## 2015-04-01 LAB — TROPONIN I

## 2015-04-01 LAB — PROTIME-INR
INR: 1.95 — AB (ref 0.00–1.49)
Prothrombin Time: 22.1 seconds — ABNORMAL HIGH (ref 11.6–15.2)

## 2015-04-01 MED ORDER — ACETAMINOPHEN 500 MG PO TABS
1000.0000 mg | ORAL_TABLET | Freq: Once | ORAL | Status: AC
  Administered 2015-04-01: 1000 mg via ORAL
  Filled 2015-04-01: qty 2

## 2015-04-01 MED ORDER — SODIUM CHLORIDE 0.9 % IV SOLN
INTRAVENOUS | Status: DC
  Administered 2015-04-01: 11:00:00 via INTRAVENOUS

## 2015-04-01 MED ORDER — BENZONATATE 100 MG PO CAPS: 100.0000 mg | ORAL_CAPSULE | Freq: Three times a day (TID) | ORAL | Status: DC | PRN

## 2015-04-01 MED ORDER — IPRATROPIUM-ALBUTEROL 0.5-2.5 (3) MG/3ML IN SOLN
3.0000 mL | Freq: Once | RESPIRATORY_TRACT | Status: AC
  Administered 2015-04-01: 3 mL via RESPIRATORY_TRACT
  Filled 2015-04-01: qty 3

## 2015-04-01 MED ORDER — ALBUTEROL SULFATE (2.5 MG/3ML) 0.083% IN NEBU
2.5000 mg | INHALATION_SOLUTION | Freq: Once | RESPIRATORY_TRACT | Status: AC
  Administered 2015-04-01: 2.5 mg via RESPIRATORY_TRACT
  Filled 2015-04-01: qty 3

## 2015-04-01 MED ORDER — OSELTAMIVIR PHOSPHATE 75 MG PO CAPS: 75.0000 mg | ORAL_CAPSULE | Freq: Two times a day (BID) | ORAL | Status: DC

## 2015-04-01 MED ORDER — SODIUM CHLORIDE 0.9 % IV BOLUS (SEPSIS)
500.0000 mL | Freq: Once | INTRAVENOUS | Status: AC
Start: 2015-04-01 — End: 2015-04-01
  Administered 2015-04-01: 500 mL via INTRAVENOUS

## 2015-04-01 NOTE — ED Notes (Signed)
Pt ambulated x 1 around nurse's station with standby assist. 02 saturation 94%-97%. Pt denies sob/dizziness. nad noted.

## 2015-04-01 NOTE — ED Provider Notes (Signed)
CSN: [id number]     Arrival date & time 04/01/15  [id number] History   First MD Initiated Contact with Patient 04/01/15 [phone]     Chief Complaint  Patient presents with  . Fever      HPI Pt was seen at 0955.  Per pt and his family, c/o gradual onset and persistence of constant cough for the past 2 days. Has been associated with generalized weakness/fatigue, home fevers/chills, and chest discomfort when coughing. Pt also states he feels "like I'm urinating a lot." LD tylenol 0300 this morning. Denies dysuria/hematuria, no testicular pain/swelling, no rash, no SOB, no N/V/D, no abd pain.     Past Medical History  Diagnosis Date  . Chronic atrial fibrillation ([place])   . COPD (chronic obstructive pulmonary disease) ([place])   . Hiatal hernia   . Abdominal aortic aneurysm without rupture ([place])   . Nephrolithiasis   . Liver cyst     Benign by liver biopsy September 2013  . Bilateral renal cysts   . Sleep apnea     Uses CPAP  . Reflux esophagitis   . Tubular adenoma   . Diverticulosis     Pancolonic  . Hemorrhoids   . Essential hypertension   . Hypercholesteremia   . BPH (benign prostatic hyperplasia)   . Pre-diabetes    Past Surgical History  Procedure Laterality Date  . Hernia repair      x2, bilateral inguinal   . Kidney stone surgery    . Knee arthroscopy      X2  . Colonoscopy      2003, no polyps per patient. Dr. West Carbo  . Nasal endoscopy    . Esophagogastroduodenoscopy  11/01/11    Dr. Gala Romney- erosive reflux esophagitis along with a patulous EG junction, hialtal hernia, antral erosions with possible area of healing ulceration- s/p bx= chronic erosive gastritis, no malignancy  . Colonoscopy  11/01/11    Dr. Gala Romney- tubular adenoma,suboptimal preparation, internal hemorrhoids, o/w normal rectum. pancolonic diverticulosis   Family History  Problem Relation Age of Onset  . Colon cancer Neg Hx   . Liver disease Neg Hx   . Breast cancer Mother   . Heart disease Father    Pacemaker   Social History  Substance Use Topics  . Smoking status: Former Smoker -- 0.50 packs/day    Types: Cigarettes  . Smokeless tobacco: Never Used     Comment: quit about 10 years  . Alcohol Use: No    Review of Systems ROS: Statement: All systems negative except as marked or noted in the HPI; Constitutional: +fever and chills, generalized weakness/fatigue. ; ; Eyes: Negative for eye pain, redness and discharge. ; ; ENMT: Negative for ear pain, hoarseness, nasal congestion, sinus pressure and sore throat. ; ; Cardiovascular: Negative for chest pain, palpitations, diaphoresis, dyspnea and peripheral edema. ; ; Respiratory: +cough. Negative for wheezing and stridor. ; ; Gastrointestinal: Negative for nausea, vomiting, diarrhea, abdominal pain, blood in stool, hematemesis, jaundice and rectal bleeding. . ; ; Genitourinary: +frequent urination. Negative for dysuria, flank pain and hematuria. ; ; Genital:  No penile drainage or rash, no testicular pain or swelling, no scrotal rash or swelling. ;; Musculoskeletal: Negative for back pain and neck pain. Negative for swelling and trauma.; ; Skin: Negative for pruritus, rash, abrasions, blisters, bruising and skin lesion.; ; Neuro: Negative for headache, lightheadedness and neck stiffness. Negative for altered level of consciousness , altered mental status, extremity weakness, paresthesias, involuntary movement, seizure and syncope.  Allergies  Latex  Home Medications   Prior to Admission medications   Medication Sig Start Date End Date Taking? Authorizing Provider  amLODipine (NORVASC) 5 MG tablet TAKE ONE TABLET BY MOUTH ONCE DAILY 11/09/13  Yes Lendon Colonel, NP  aspirin EC 81 MG tablet Take 81 mg by mouth daily.   Yes Historical Provider, MD  benazepril (LOTENSIN) 40 MG tablet TAKE ONE TABLET BY MOUTH ONCE DAILY 01/04/14  Yes Satira Sark, MD  Cholecalciferol (VITAMIN D-3) 1000 units CAPS Take 2,000 Units by mouth.   Yes  Historical Provider, MD  finasteride (PROSCAR) 5 MG tablet Take 5 mg by mouth daily.   Yes Historical Provider, MD  Garlic [id number] MG CAPS Take 1,000 mg by mouth daily.   Yes Historical Provider, MD  oxybutynin (DITROPAN-XL) 10 MG 24 hr tablet Take 10 mg by mouth daily.   Yes Historical Provider, MD  pantoprazole (PROTONIX) 40 MG tablet Take 1 tablet (40 mg total) by mouth 2 (two) times daily. 08/24/13  Yes Orvil Feil, NP  polyethylene glycol (MIRALAX / GLYCOLAX) packet Take 17 g by mouth daily as needed for mild constipation. Reported on 03/25/2015   Yes Historical Provider, MD  potassium chloride SA (K-DUR,KLOR-CON) 20 MEQ tablet TAKE ONE TABLET BY MOUTH ONCE DAILY 06/22/14  Yes Satira Sark, MD  propranolol (INDERAL) 20 MG tablet Take 20 mg by mouth 2 (two) times daily.   Yes Historical Provider, MD  rosuvastatin (CRESTOR) 10 MG tablet Take 1 tablet (10 mg total) by mouth daily. 02/17/13  Yes Lendon Colonel, NP  Tamsulosin HCl (FLOMAX) 0.4 MG CAPS Take 0.4 mg by mouth daily.   Yes Historical Provider, MD  warfarin (COUMADIN) 5 MG tablet Take 1-1.5 tablets (5-7.5 mg total) by mouth as directed. Patient takes 1 tablet daily except 1 1/2 tablets on Mondays and Fridays Patient taking differently: Take 5-7.5 mg by mouth as directed. Patient takes 1 tablet daily except 1 1/2 tablets on Thursday 07/13/13  Yes Josue Hector, MD  Wheat Dextrin (BENEFIBER PO) Take 1 scoop by mouth daily as needed (constipation). Reported on 03/25/2015   Yes Historical Provider, MD   BP 126/80 mmHg  Pulse 105  Temp(Src) 103.1 F (39.5 C) (Oral)  Resp 23  Ht 5\' 9"  (1.753 m)  Wt 240 lb (108.863 kg)  BMI 35.43 kg/m2  SpO2 99%   10:32 Orthostatic Vital Signs SP  Orthostatic Lying  - BP- Lying: 131/55 mmHg ; Pulse- Lying: 90  Orthostatic Sitting - BP- Sitting: 134/72 mmHg ; Pulse- Sitting: 113  Orthostatic Standing at 0 minutes - BP- Standing at 0 minutes: 123/65 mmHg ; Pulse- Standing at 0 minutes: 113     Patient  Vitals for the past 24 hrs:  BP Temp Temp src Pulse Resp SpO2 Height Weight  04/01/15 1400 (!) 118/101 mmHg - - 76 17 99 % - -  04/01/15 1300 127/70 mmHg - - 81 21 97 % - -  04/01/15 1243 125/73 mmHg - - 80 14 98 % - -  04/01/15 1200 131/65 mmHg - - 94 - 96 % - -  04/01/15 1157 123/58 mmHg - - 90 22 96 % - -  04/01/15 1130 123/58 mmHg - - 81 - 96 % - -  04/01/15 1112 - 99.8 F (37.7 C) Oral - - - - -  04/01/15 1100 128/63 mmHg - - 84 - 95 % - -  04/01/15 0938 126/80 mmHg (!) 103.1 F (39.5 C) Oral  105 23 99 % 5\' 9"  (1.753 m) 240 lb (108.863 kg)     Physical Exam  1000: Physical examination:  Nursing notes reviewed; Vital signs and O2 SAT reviewed; +febrile.;; Constitutional: Well developed, Well nourished, Well hydrated, In no acute distress; Head:  Normocephalic, atraumatic; Eyes: EOMI, PERRL, No scleral icterus; ENMT: Mouth and pharynx normal, Mucous membranes moist; Neck: Supple, Full range of motion, No lymphadenopathy; Cardiovascular: Irregular irregular rate and rhythm, No gallop; Respiratory: Breath sounds coarse & equal bilaterally, No wheezes. +moist cough during exam. Speaking full sentences with ease, Normal respiratory effort/excursion; Chest: Nontender, Movement normal; Abdomen: Soft, Nontender, Nondistended, Normal bowel sounds; Genitourinary: No CVA tenderness; Extremities: Pulses normal, No tenderness, No edema, No calf edema or asymmetry.; Neuro: AA&Ox3, Major CN grossly intact.  Speech clear. No gross focal motor or sensory deficits in extremities.; Skin: Color normal, Warm, Dry.   ED Course  Procedures (including critical care time) Labs Review  Imaging Review  I have personally reviewed and evaluated these images and lab results as part of my medical decision-making.   EKG Interpretation   Date/Time:  Friday April 01 2015 09:59:03 EST Ventricular Rate:  87 PR Interval:    QRS Duration: 77 QT Interval:  345 QTC Calculation: 415 R Axis:   68 Text  Interpretation:  Atrial fibrillation Low voltage, precordial leads  When compared with ECG of 10/04/2011 No significant change was found  Confirmed by [facility]  MD, Nunzio Cory [id number]) on 04/01/2015 10:49:40 AM      MDM  MDM Reviewed: previous chart, nursing note and vitals Reviewed previous: labs and ECG Interpretation: labs, ECG and x-ray      Results for orders placed or performed during the hospital encounter of [id number]  Basic metabolic panel  Result Value Ref Range   Sodium 136 135 - 145 mmol/L   Potassium 3.3 (L) 3.5 - 5.1 mmol/L   Chloride 102 101 - 111 mmol/L   CO2 26 22 - 32 mmol/L   Glucose, Bld 132 (H) 65 - 99 mg/dL   BUN 18 6 - 20 mg/dL   Creatinine, Ser 0.81 0.61 - 1.24 mg/dL   Calcium 8.2 (L) 8.9 - 10.3 mg/dL   GFR calc non Af Amer >60 >60 mL/min   GFR calc Af Amer >60 >60 mL/min   Anion gap 8 5 - 15  Troponin I  Result Value Ref Range   Troponin I <0.03 <0.031 ng/mL  Lactic acid, plasma  Result Value Ref Range   Lactic Acid, Venous 0.8 0.5 - 2.0 mmol/L  Lactic acid, plasma  Result Value Ref Range   Lactic Acid, Venous 0.9 0.5 - 2.0 mmol/L  CBC with Differential  Result Value Ref Range   WBC 7.9 4.0 - 10.5 K/uL   RBC 4.11 (L) 4.22 - 5.81 MIL/uL   Hemoglobin 12.2 (L) 13.0 - 17.0 g/dL   HCT 37.8 (L) 39.0 - 52.0 %   MCV 92.0 78.0 - 100.0 fL   MCH 29.7 26.0 - 34.0 pg   MCHC 32.3 30.0 - 36.0 g/dL   RDW 14.3 11.5 - 15.5 %   Platelets 149 (L) 150 - 400 K/uL   Neutrophils Relative % 82 %   Neutro Abs 6.5 1.7 - 7.7 K/uL   Lymphocytes Relative 8 %   Lymphs Abs 0.6 (L) 0.7 - 4.0 K/uL   Monocytes Relative 10 %   Monocytes Absolute 0.8 0.1 - 1.0 K/uL   Eosinophils Relative 0 %   Eosinophils Absolute 0.0 0.0 - 0.7  K/uL   Basophils Relative 0 %   Basophils Absolute 0.0 0.0 - 0.1 K/uL  Urinalysis, Routine w reflex microscopic  Result Value Ref Range   Color, Urine YELLOW YELLOW   APPearance CLEAR CLEAR   Specific Gravity, Urine 1.010 1.005 - 1.030   pH 6.5 5.0  - 8.0   Glucose, UA NEGATIVE NEGATIVE mg/dL   Hgb urine dipstick LARGE (A) NEGATIVE   Bilirubin Urine NEGATIVE NEGATIVE   Ketones, ur NEGATIVE NEGATIVE mg/dL   Protein, ur 30 (A) NEGATIVE mg/dL   Nitrite NEGATIVE NEGATIVE   Leukocytes, UA NEGATIVE NEGATIVE  Protime-INR  Result Value Ref Range   Prothrombin Time 22.1 (H) 11.6 - 15.2 seconds   INR 1.95 (H) 0.00 - 1.49  Influenza panel by PCR (type A & B, H1N1)  Result Value Ref Range   Influenza A By PCR POSITIVE (A) NEGATIVE   Influenza B By PCR NEGATIVE NEGATIVE   H1N1 flu by pcr NOT DETECTED NOT DETECTED  Urine microscopic-add on  Result Value Ref Range   Squamous Epithelial / LPF TOO NUMEROUS TO COUNT (A) NONE SEEN   WBC, UA NONE SEEN 0 - 5 WBC/hpf   RBC / HPF TOO NUMEROUS TO COUNT 0 - 5 RBC/hpf   Bacteria, UA FEW (A) NONE SEEN   Dg Chest 2 View 04/01/2015  CLINICAL DATA:  Chest pain and cough EXAM: CHEST  2 VIEW COMPARISON:  None. FINDINGS: The heart size and mediastinal contours are within normal limits. Both lungs are clear. The visualized skeletal structures are unremarkable. IMPRESSION: No active cardiopulmonary disease. Electronically Signed   By: Inez Catalina M.D.   On: 04/01/2015 10:33    1410:  Udip contaminated. VS remain stable, resps easy, abd benign. Has tol PO well without N/V. No stooling while in the ED. Pt ambulated with steady gait, easy resps, Sats 94-97% R/A. Pt denied any complaints. Fever improved after APAP. Pt would like to go home now. No clear indication for admission at this time; strict return precautions given. Will tx for influenza. Dx and testing d/w pt and family.  Questions answered.  Verb understanding, agreeable to d/c home with outpt f/u.   Francine Graven, DO 04/04/15 1630

## 2015-04-01 NOTE — ED Notes (Addendum)
Patient with cough, congestion, fever for 2 days. Fever 103 today. Congested cough noted. Chest pain with cough. Alert/oriented x 4. Took 1 gram of Tylenol at 0200 today.

## 2015-04-01 NOTE — Discharge Instructions (Signed)
Take the prescriptions as directed. Take over the counter tylenol, as directed on packaging, as needed for fever or discomfort. Increase your fluid intake (ie:  Gatoraide) for the next few days, as discussed.  Eat a bland diet and advance to your regular diet slowly as you can tolerate it.  Call your regular medical doctor today to schedule a follow up appointment in the next 2 days.  Return to the Emergency Department immediately sooner if worsening.

## 2015-04-03 LAB — URINE CULTURE: Culture: 1000

## 2015-04-27 ENCOUNTER — Ambulatory Visit (INDEPENDENT_AMBULATORY_CARE_PROVIDER_SITE_OTHER): Payer: Medicare Other | Admitting: *Deleted

## 2015-04-27 DIAGNOSIS — Z5181 Encounter for therapeutic drug level monitoring: Secondary | ICD-10-CM | POA: Diagnosis not present

## 2015-04-27 DIAGNOSIS — I482 Chronic atrial fibrillation, unspecified: Secondary | ICD-10-CM

## 2015-04-27 DIAGNOSIS — Z7901 Long term (current) use of anticoagulants: Secondary | ICD-10-CM | POA: Diagnosis not present

## 2015-04-27 DIAGNOSIS — I4891 Unspecified atrial fibrillation: Secondary | ICD-10-CM | POA: Diagnosis not present

## 2015-04-27 LAB — POCT INR: INR: 3.9

## 2015-05-18 ENCOUNTER — Ambulatory Visit (INDEPENDENT_AMBULATORY_CARE_PROVIDER_SITE_OTHER): Payer: Medicare Other | Admitting: *Deleted

## 2015-05-18 DIAGNOSIS — I482 Chronic atrial fibrillation, unspecified: Secondary | ICD-10-CM

## 2015-05-18 DIAGNOSIS — I4891 Unspecified atrial fibrillation: Secondary | ICD-10-CM | POA: Diagnosis not present

## 2015-05-18 DIAGNOSIS — Z7901 Long term (current) use of anticoagulants: Secondary | ICD-10-CM | POA: Diagnosis not present

## 2015-05-18 DIAGNOSIS — Z5181 Encounter for therapeutic drug level monitoring: Secondary | ICD-10-CM

## 2015-05-18 LAB — POCT INR: INR: 4

## 2015-06-01 ENCOUNTER — Ambulatory Visit (INDEPENDENT_AMBULATORY_CARE_PROVIDER_SITE_OTHER): Payer: Medicare Other | Admitting: *Deleted

## 2015-06-01 DIAGNOSIS — Z5181 Encounter for therapeutic drug level monitoring: Secondary | ICD-10-CM | POA: Diagnosis not present

## 2015-06-01 DIAGNOSIS — I482 Chronic atrial fibrillation, unspecified: Secondary | ICD-10-CM

## 2015-06-01 DIAGNOSIS — I4891 Unspecified atrial fibrillation: Secondary | ICD-10-CM | POA: Diagnosis not present

## 2015-06-01 DIAGNOSIS — Z7901 Long term (current) use of anticoagulants: Secondary | ICD-10-CM | POA: Diagnosis not present

## 2015-06-01 LAB — POCT INR: INR: 2.3

## 2015-06-16 ENCOUNTER — Other Ambulatory Visit: Payer: Self-pay | Admitting: Cardiology

## 2015-06-29 ENCOUNTER — Ambulatory Visit (INDEPENDENT_AMBULATORY_CARE_PROVIDER_SITE_OTHER): Payer: Medicare Other | Admitting: *Deleted

## 2015-06-29 DIAGNOSIS — I482 Chronic atrial fibrillation, unspecified: Secondary | ICD-10-CM

## 2015-06-29 DIAGNOSIS — Z5181 Encounter for therapeutic drug level monitoring: Secondary | ICD-10-CM

## 2015-06-29 DIAGNOSIS — Z7901 Long term (current) use of anticoagulants: Secondary | ICD-10-CM | POA: Diagnosis not present

## 2015-06-29 DIAGNOSIS — I4891 Unspecified atrial fibrillation: Secondary | ICD-10-CM

## 2015-06-29 LAB — POCT INR: INR: 2.5

## 2015-07-27 ENCOUNTER — Ambulatory Visit (INDEPENDENT_AMBULATORY_CARE_PROVIDER_SITE_OTHER): Payer: Medicare Other | Admitting: *Deleted

## 2015-07-27 DIAGNOSIS — I482 Chronic atrial fibrillation, unspecified: Secondary | ICD-10-CM

## 2015-07-27 DIAGNOSIS — Z7901 Long term (current) use of anticoagulants: Secondary | ICD-10-CM

## 2015-07-27 DIAGNOSIS — Z5181 Encounter for therapeutic drug level monitoring: Secondary | ICD-10-CM

## 2015-07-27 DIAGNOSIS — I4891 Unspecified atrial fibrillation: Secondary | ICD-10-CM

## 2015-07-27 LAB — POCT INR: INR: 2.3

## 2015-09-07 ENCOUNTER — Ambulatory Visit (INDEPENDENT_AMBULATORY_CARE_PROVIDER_SITE_OTHER): Payer: Medicare Other | Admitting: *Deleted

## 2015-09-07 DIAGNOSIS — I482 Chronic atrial fibrillation, unspecified: Secondary | ICD-10-CM

## 2015-09-07 DIAGNOSIS — Z5181 Encounter for therapeutic drug level monitoring: Secondary | ICD-10-CM

## 2015-09-07 DIAGNOSIS — I4891 Unspecified atrial fibrillation: Secondary | ICD-10-CM | POA: Diagnosis not present

## 2015-09-07 DIAGNOSIS — Z7901 Long term (current) use of anticoagulants: Secondary | ICD-10-CM | POA: Diagnosis not present

## 2015-09-07 LAB — POCT INR: INR: 2.1

## 2015-10-19 ENCOUNTER — Ambulatory Visit (INDEPENDENT_AMBULATORY_CARE_PROVIDER_SITE_OTHER): Payer: Medicare Other | Admitting: *Deleted

## 2015-10-19 DIAGNOSIS — Z5181 Encounter for therapeutic drug level monitoring: Secondary | ICD-10-CM

## 2015-10-19 DIAGNOSIS — I4891 Unspecified atrial fibrillation: Secondary | ICD-10-CM

## 2015-10-19 DIAGNOSIS — Z7901 Long term (current) use of anticoagulants: Secondary | ICD-10-CM

## 2015-10-19 DIAGNOSIS — I482 Chronic atrial fibrillation, unspecified: Secondary | ICD-10-CM

## 2015-10-19 LAB — POCT INR: INR: 2.4

## 2015-11-30 ENCOUNTER — Ambulatory Visit (INDEPENDENT_AMBULATORY_CARE_PROVIDER_SITE_OTHER): Payer: Medicare Other | Admitting: *Deleted

## 2015-11-30 DIAGNOSIS — Z5181 Encounter for therapeutic drug level monitoring: Secondary | ICD-10-CM

## 2015-11-30 DIAGNOSIS — I482 Chronic atrial fibrillation, unspecified: Secondary | ICD-10-CM

## 2015-11-30 DIAGNOSIS — I4891 Unspecified atrial fibrillation: Secondary | ICD-10-CM | POA: Diagnosis not present

## 2015-11-30 DIAGNOSIS — Z7901 Long term (current) use of anticoagulants: Secondary | ICD-10-CM | POA: Diagnosis not present

## 2015-11-30 LAB — POCT INR: INR: 2.5

## 2016-01-11 ENCOUNTER — Ambulatory Visit (INDEPENDENT_AMBULATORY_CARE_PROVIDER_SITE_OTHER): Payer: Medicare Other | Admitting: *Deleted

## 2016-01-11 DIAGNOSIS — Z7901 Long term (current) use of anticoagulants: Secondary | ICD-10-CM | POA: Diagnosis not present

## 2016-01-11 DIAGNOSIS — I482 Chronic atrial fibrillation, unspecified: Secondary | ICD-10-CM

## 2016-01-11 DIAGNOSIS — Z5181 Encounter for therapeutic drug level monitoring: Secondary | ICD-10-CM

## 2016-01-11 DIAGNOSIS — I4891 Unspecified atrial fibrillation: Secondary | ICD-10-CM

## 2016-01-11 LAB — POCT INR: INR: 2.4

## 2016-02-22 ENCOUNTER — Ambulatory Visit (INDEPENDENT_AMBULATORY_CARE_PROVIDER_SITE_OTHER): Payer: Medicare Other | Admitting: *Deleted

## 2016-02-22 DIAGNOSIS — Z5181 Encounter for therapeutic drug level monitoring: Secondary | ICD-10-CM | POA: Diagnosis not present

## 2016-02-22 DIAGNOSIS — I4891 Unspecified atrial fibrillation: Secondary | ICD-10-CM

## 2016-02-22 DIAGNOSIS — I482 Chronic atrial fibrillation, unspecified: Secondary | ICD-10-CM

## 2016-02-22 DIAGNOSIS — Z7901 Long term (current) use of anticoagulants: Secondary | ICD-10-CM | POA: Diagnosis not present

## 2016-02-22 LAB — POCT INR: INR: 2.4

## 2016-03-02 ENCOUNTER — Encounter: Payer: Self-pay | Admitting: Internal Medicine

## 2016-03-06 NOTE — Progress Notes (Signed)
Cardiology Office Note  Date: 03/07/2016   ID: Tristan Lopez, DOB January 12, 1931, MRN [id number]  PCP: [name], MD  Primary Cardiologist: [name], MD   Chief Complaint  Patient presents with  . Atrial Fibrillation    History of Present Illness: Tristan Lopez is an 83 y.o. male that I have not seen in the office since May 2016. He is here today for a follow-up visit. Since I last saw him he states that he has slowed down somewhat, but is not having any palpitations or exertional chest pain. Still enjoys getting out in the yard when the weather permits.  He continues on Coumadin with follow-up in the anticoagulation clinic. He denies any major bleeding episodes. I reviewed his ECG today which shows rate-controlled atrial fibrillation.  I reviewed his medications which are outlined below. We discussed stopping aspirin since he continues with Coumadin. Heart rate control has been good on propranolol alone. Blood pressure is also well controlled today.  Abdominal ultrasound from 2016 is outlined below, infrarenal AAA measuring 3.5 cm. We discussed arranging a follow-up study for this year.  Past Medical History:  Diagnosis Date  . Abdominal aortic aneurysm without rupture ([place])   . Bilateral renal cysts   . BPH (benign prostatic hyperplasia)   . Chronic atrial fibrillation ([place])   . COPD (chronic obstructive pulmonary disease) ([place])   . Diverticulosis    Pancolonic  . Essential hypertension   . Hemorrhoids   . Hiatal hernia   . Hypercholesteremia   . Liver cyst    Benign by liver biopsy September 2013  . Nephrolithiasis   . Pre-diabetes   . Reflux esophagitis   . Sleep apnea    Uses CPAP  . Tubular adenoma     Past Surgical History:  Procedure Laterality Date  . COLONOSCOPY     2003, no polyps per patient. Dr. West Carbo  . COLONOSCOPY  11/01/11   Dr. Gala Romney- tubular adenoma,suboptimal preparation, internal hemorrhoids, o/w normal rectum. pancolonic  diverticulosis  . ESOPHAGOGASTRODUODENOSCOPY  11/01/11   Dr. Gala Romney- erosive reflux esophagitis along with a patulous EG junction, hialtal hernia, antral erosions with possible area of healing ulceration- s/p bx= chronic erosive gastritis, no malignancy  . HERNIA REPAIR     x2, bilateral inguinal   . KIDNEY STONE SURGERY    . KNEE ARTHROSCOPY     X2  . NASAL ENDOSCOPY      Current Outpatient Prescriptions  Medication Sig Dispense Refill  . amLODipine (NORVASC) 5 MG tablet TAKE ONE TABLET BY MOUTH ONCE DAILY 90 tablet 0  . benazepril (LOTENSIN) 40 MG tablet TAKE ONE TABLET BY MOUTH ONCE DAILY 90 tablet 3  . Cholecalciferol (VITAMIN D-3) 1000 units CAPS Take 2,000 Units by mouth.    . finasteride (PROSCAR) 5 MG tablet Take 5 mg by mouth daily.    . Garlic 123XX123 MG CAPS Take 1,000 mg by mouth daily.    Marland Kitchen losartan-hydrochlorothiazide (HYZAAR) 100-25 MG tablet TAKE ONE TABLET BY MOUTH ONCE DAILY 90 tablet 3  . oxybutynin (DITROPAN-XL) 10 MG 24 hr tablet Take 10 mg by mouth daily.    . pantoprazole (PROTONIX) 40 MG tablet Take 1 tablet (40 mg total) by mouth 2 (two) times daily. 180 tablet 1  . potassium chloride SA (K-DUR,KLOR-CON) 20 MEQ tablet TAKE ONE TABLET BY MOUTH ONCE DAILY 90 tablet 3  . propranolol (INDERAL) 20 MG tablet Take 20 mg by mouth every morning.     . rosuvastatin (CRESTOR)  10 MG tablet Take 1 tablet (10 mg total) by mouth daily. 90 tablet 1  . Tamsulosin HCl (FLOMAX) 0.4 MG CAPS Take 0.4 mg by mouth daily.    Marland Kitchen warfarin (COUMADIN) 5 MG tablet Take 1-1.5 tablets (5-7.5 mg total) by mouth as directed. Patient takes 1 tablet daily except 1 1/2 tablets on Mondays and Fridays (Patient taking differently: Take 5-7.5 mg by mouth as directed. Patient takes 1 tablet daily except 1 1/2 tablets on Thursday) 100 tablet 3   No current facility-administered medications for this visit.    Allergies:  Latex   Social History: The patient  reports that he has quit smoking. His smoking use  included Cigarettes. He smoked 0.50 packs per day. He has never used smokeless tobacco. He reports that he does not drink alcohol or use drugs.   ROS:  Please see the history of present illness. Otherwise, complete review of systems is positive for intermittent cough, no fevers or chills.  All other systems are reviewed and negative.   Physical Exam: VS:  BP 128/66   Pulse 98   Ht 5\' 8"  (1.727 m)   Wt 237 lb (107.5 kg)   SpO2 98%   BMI 36.04 kg/m , BMI Body mass index is 36.04 kg/m.  Wt Readings from Last 3 Encounters:  03/07/16 237 lb (107.5 kg)  04/01/15 240 lb (108.9 kg)  03/25/15 240 lb 12.8 oz (109.2 kg)    Elderly male, appears comfortable at rest. HEENT: Conjunctiva and lids normal, oropharynx clear. Neck: Supple, no elevated JVP or carotid bruits, no thyromegaly. Lungs: Clear to auscultation, nonlabored breathing at rest. Cardiac: Irregularly irregular, no S3, soft systolic murmur, no pericardial rub. Abdomen: Soft, nontender, bowel sounds present, no guarding or rebound. No bruit. Extremities: Trace edema, distal pulses 2+. Skin: Warm and dry. Musculoskeletal: No kyphosis. Neuropsychiatric: Alert and oriented x3, affect grossly appropriate.  ECG: I personally reviewed the tracing from 04/01/2015 which showed rate-controlled atrial fibrillation.  Recent Labwork: 04/01/2015: BUN 18; Creatinine, Ser 0.81; Hemoglobin 12.2; Platelets 149; Potassium 3.3; Sodium 136     Component Value Date/Time   CHOL 147 02/17/2013 0820   TRIG 86 02/17/2013 0820   HDL 50 02/17/2013 0820   CHOLHDL 2.9 02/17/2013 0820   VLDL 17 02/17/2013 0820   [place] 80 02/17/2013 0820    Other Studies Reviewed Today:  Echocardiogram 09/16/2014 (Dr. Anastasio Champion office): LVH with preserved LVEF 66%, ungraded diastolic dysfunction, no significant valvular abnormalities.  Abdominal ultrasound 06/16/2014: IMPRESSION: 3.5 cm infrarenal abdominal aortic aneurysm, not significantly changed since prior CT.  Recommend followup by ultrasound in 2 years. This recommendation follows ACR consensus guidelines: White Paper of the ACR Incidental Findings Committee II on Vascular Findings. J Am Coll Radiol 2013; 10:789-794.  Assessment and Plan:  1. Chronic atrial fibrillation, continue strategy of heart rate control and anticoagulation. He remains on Coumadin with follow-up in the anticoagulation clinic. ECG reviewed.  2. Abdominal aortic aneurysm, 3.5 cm as of 2016. He is asymptomatic. Follow-up ultrasound arranged for this year.  3. Essential hypertension, keep follow-up with Dr. Anastasio Champion.  4. Hyperlipidemia, remains on Crestor.   Current medicines were reviewed with the patient today.   Orders Placed This Encounter  Procedures  . US ABDOMINAL AORTA SCREENING AAA  . EKG 12-Lead    Disposition: Follow-up in one year.  Signed, Satira Sark, MD, [facility] 03/07/2016 10:29 AM    [facility]. [address], [place], [place] [address] Phone: [phone]; Fax: [phone]

## 2016-03-07 ENCOUNTER — Encounter: Payer: Self-pay | Admitting: Cardiology

## 2016-03-07 ENCOUNTER — Other Ambulatory Visit: Payer: Self-pay

## 2016-03-07 ENCOUNTER — Ambulatory Visit (INDEPENDENT_AMBULATORY_CARE_PROVIDER_SITE_OTHER): Payer: Medicare Other | Admitting: Cardiology

## 2016-03-07 VITALS — BP 128/66 | HR 98 | Ht 68.0 in | Wt 237.0 lb

## 2016-03-07 DIAGNOSIS — Z8679 Personal history of other diseases of the circulatory system: Secondary | ICD-10-CM | POA: Diagnosis not present

## 2016-03-07 DIAGNOSIS — I10 Essential (primary) hypertension: Secondary | ICD-10-CM

## 2016-03-07 DIAGNOSIS — I482 Chronic atrial fibrillation, unspecified: Secondary | ICD-10-CM

## 2016-03-07 DIAGNOSIS — E782 Mixed hyperlipidemia: Secondary | ICD-10-CM

## 2016-03-07 NOTE — Patient Instructions (Signed)
Medication Instructions:  STOP ASPIRIN   Labwork: NONE  Testing/Procedures: Your physician has requested that you have an abdominal aorta duplex. During this test, an ultrasound is used to evaluate the aorta. Allow 30 minutes for this exam. Do not eat after midnight the day before and avoid carbonated beverages ( 6 MONTHS )    Follow-Up: .Your physician wants you to follow-up in: 1 YEAR.  You will receive a reminder letter in the mail two months in advance. If you don't receive a letter, please call our office to schedule the follow-up appointment.   Any Other Special Instructions Will Be Listed Below (If Applicable).     If you need a refill on your cardiac medications before your next appointment, please call your pharmacy.

## 2016-03-20 ENCOUNTER — Ambulatory Visit (INDEPENDENT_AMBULATORY_CARE_PROVIDER_SITE_OTHER): Payer: Medicare Other | Admitting: Internal Medicine

## 2016-03-20 ENCOUNTER — Encounter: Payer: Self-pay | Admitting: Internal Medicine

## 2016-03-20 VITALS — BP 148/65 | HR 55 | Temp 97.6°F | Ht 68.0 in | Wt 237.4 lb

## 2016-03-20 DIAGNOSIS — K5900 Constipation, unspecified: Secondary | ICD-10-CM | POA: Diagnosis not present

## 2016-03-20 DIAGNOSIS — K219 Gastro-esophageal reflux disease without esophagitis: Secondary | ICD-10-CM | POA: Diagnosis not present

## 2016-03-20 NOTE — Patient Instructions (Signed)
Continue Protonix 40 mg twice daily  See Dr. Anastasio Champion for Phelm build up  Follow-up on AAA per Drs. Anastasio Champion and McDowell  Regular exercise - as weather permits  Continue natural remedies (like prunes) for constipation  Office visit in 1 year

## 2016-03-20 NOTE — Progress Notes (Signed)
Primary Care Physician:  [name], MD Primary Gastroenterologist:  Dr. Gala Romney  Pre-Procedure History & Physical: HPI:  Tristan Lopez is a 83 y.o. male here for  follow-up of GERD and constipation. No longer taking a stool softener. Uses prunes with excellent results. Hasn't had any bleeding. Remains on Coumadin. Typical reflux symptoms well controlled on Protonix 40 mg twice daily. No dysphagia. Continues to have intermittent "phlegm" buildup in the back of his throat. No typical reflux symptoms. History of a AAA followed by Dr. Domenic Polite.  Past Medical History:  Diagnosis Date  . Abdominal aortic aneurysm without rupture ([place])   . Bilateral renal cysts   . BPH (benign prostatic hyperplasia)   . Chronic atrial fibrillation ([place])   . COPD (chronic obstructive pulmonary disease) ([place])   . Diverticulosis    Pancolonic  . Essential hypertension   . Hemorrhoids   . Hiatal hernia   . Hypercholesteremia   . Liver cyst    Benign by liver biopsy September 2013  . Nephrolithiasis   . Pre-diabetes   . Reflux esophagitis   . Sleep apnea    Uses CPAP  . Tubular adenoma     Past Surgical History:  Procedure Laterality Date  . COLONOSCOPY     2003, no polyps per patient. Dr. West Carbo  . COLONOSCOPY  11/01/11   Dr. Gala Romney- tubular adenoma,suboptimal preparation, internal hemorrhoids, o/w normal rectum. pancolonic diverticulosis  . ESOPHAGOGASTRODUODENOSCOPY  11/01/11   Dr. Gala Romney- erosive reflux esophagitis along with a patulous EG junction, hialtal hernia, antral erosions with possible area of healing ulceration- s/p bx= chronic erosive gastritis, no malignancy  . HERNIA REPAIR     x2, bilateral inguinal   . KIDNEY STONE SURGERY    . KNEE ARTHROSCOPY     X2  . NASAL ENDOSCOPY      Prior to Admission medications   Medication Sig Start Date End Date Taking? Authorizing Provider  amLODipine (NORVASC) 5 MG tablet TAKE ONE TABLET BY MOUTH ONCE DAILY 11/09/13  Yes Lendon Colonel, NP  benazepril (LOTENSIN) 40 MG tablet TAKE ONE TABLET BY MOUTH ONCE DAILY 01/04/14  Yes Satira Sark, MD  Cholecalciferol (VITAMIN D-3) 1000 units CAPS Take 2,000 Units by mouth.   Yes Historical Provider, MD  finasteride (PROSCAR) 5 MG tablet Take 5 mg by mouth daily.   Yes Historical Provider, MD  Garlic 123XX123 MG CAPS Take 1,000 mg by mouth daily.   Yes Historical Provider, MD  losartan-hydrochlorothiazide (HYZAAR) 100-25 MG tablet TAKE ONE TABLET BY MOUTH ONCE DAILY 06/16/15  Yes Satira Sark, MD  oxybutynin (DITROPAN-XL) 10 MG 24 hr tablet Take 10 mg by mouth daily.   Yes Historical Provider, MD  pantoprazole (PROTONIX) 40 MG tablet Take 1 tablet (40 mg total) by mouth 2 (two) times daily. 08/24/13  Yes Annitta Needs, NP  potassium chloride SA (K-DUR,KLOR-CON) 20 MEQ tablet TAKE ONE TABLET BY MOUTH ONCE DAILY 06/16/15  Yes Satira Sark, MD  propranolol (INDERAL) 20 MG tablet Take 20 mg by mouth daily.    Yes Historical Provider, MD  rosuvastatin (CRESTOR) 10 MG tablet Take 1 tablet (10 mg total) by mouth daily. 02/17/13  Yes Lendon Colonel, NP  Tamsulosin HCl (FLOMAX) 0.4 MG CAPS Take 0.4 mg by mouth daily.   Yes Historical Provider, MD  warfarin (COUMADIN) 5 MG tablet Take 1-1.5 tablets (5-7.5 mg total) by mouth as directed. Patient takes 1 tablet daily except 1 1/2 tablets on Mondays  and Fridays Patient taking differently: Take 5-7.5 mg by mouth daily.  07/13/13  Yes Josue Hector, MD    Allergies as of 03/20/2016 - Review Complete 03/20/2016  Allergen Reaction Noted  . Latex Rash 10/04/2011    Family History  Problem Relation Age of Onset  . Breast cancer Mother   . Heart disease Father     Pacemaker  . Colon cancer Neg Hx   . Liver disease Neg Hx     Social History   Social History  . Marital status: Married    Spouse name: N/A  . Number of children: 3  . Years of education: N/A   Occupational History  .  Retired   Social History Main Topics  .  Smoking status: Former Smoker    Packs/day: 0.50    Types: Cigarettes  . Smokeless tobacco: Never Used     Comment: quit about 10 years  . Alcohol use No  . Drug use: No  . Sexual activity: Not on file   Other Topics Concern  . Not on file   Social History Narrative  . No narrative on file    Review of Systems: See HPI, otherwise negative ROS  Physical Exam: BP (!) 148/65   Pulse (!) 55   Temp 97.6 F (36.4 C) (Oral)   Ht 5\' 8"  (1.727 m)   Wt 237 lb 6.4 oz (107.7 kg)   BMI 36.10 kg/m  General:   Alert,  pleasant and cooperative in NAD Neck:  Supple; no masses or thyromegaly. No significant cervical adenopathy. Lungs:  Clear throughout to auscultation.   No wheezes, crackles, or rhonchi. No acute distress. Heart:  Irregularly irregular rhythm; no murmurs, clicks, rubs,  or gallops. Abdomen: Obese. Non-distended, normal bowel sounds.  Soft and nontender without appreciable mass or hepatosplenomegaly.  Pulses:  Normal pulses noted. Extremities:  Without clubbing or edema.  Impression:  Pleasant 83 year old gentleman long history of her GERD well controlled on twice a day PPI therapy.  Phlegm buildup in the back of his throat continues to be a complaint. Any reflux related component to his phlegm should be Minimized with twice a day acid suppression therapy. There may well be other causes of phlegm buildup including sinus and pulmonary issues. Constipation not an issue these days with natural remedies as outlined above.  Recommendations: Continue Protonix 40 mg twice daily  See Dr. Anastasio Champion for Phelm build up  Follow-up on AAA per Drs. Anastasio Champion and [place]  Regular exercise - as weather permits  Continue natural remedies (like prunes) for constipation  Office visit in 1 year        Notice: This dictation was prepared with Dragon dictation along with smaller phrase technology. Any transcriptional errors that result from this process are unintentional and may not be  corrected upon review.

## 2016-04-04 ENCOUNTER — Ambulatory Visit (INDEPENDENT_AMBULATORY_CARE_PROVIDER_SITE_OTHER): Payer: Medicare Other | Admitting: *Deleted

## 2016-04-04 DIAGNOSIS — I482 Chronic atrial fibrillation, unspecified: Secondary | ICD-10-CM

## 2016-04-04 DIAGNOSIS — Z7901 Long term (current) use of anticoagulants: Secondary | ICD-10-CM | POA: Diagnosis not present

## 2016-04-04 DIAGNOSIS — I4891 Unspecified atrial fibrillation: Secondary | ICD-10-CM

## 2016-04-04 DIAGNOSIS — Z5181 Encounter for therapeutic drug level monitoring: Secondary | ICD-10-CM | POA: Diagnosis not present

## 2016-04-04 LAB — POCT INR: INR: 3

## 2016-05-16 ENCOUNTER — Ambulatory Visit (INDEPENDENT_AMBULATORY_CARE_PROVIDER_SITE_OTHER): Payer: Medicare Other | Admitting: *Deleted

## 2016-05-16 DIAGNOSIS — I4891 Unspecified atrial fibrillation: Secondary | ICD-10-CM | POA: Diagnosis not present

## 2016-05-16 DIAGNOSIS — Z7901 Long term (current) use of anticoagulants: Secondary | ICD-10-CM | POA: Diagnosis not present

## 2016-05-16 DIAGNOSIS — I482 Chronic atrial fibrillation, unspecified: Secondary | ICD-10-CM

## 2016-05-16 DIAGNOSIS — Z5181 Encounter for therapeutic drug level monitoring: Secondary | ICD-10-CM

## 2016-05-16 LAB — POCT INR: INR: 3.2

## 2016-05-16 MED ORDER — WARFARIN SODIUM 5 MG PO TABS: ORAL_TABLET | ORAL | 3 refills | Status: DC

## 2016-06-12 ENCOUNTER — Other Ambulatory Visit: Payer: Self-pay | Admitting: Cardiology

## 2016-06-27 ENCOUNTER — Ambulatory Visit (INDEPENDENT_AMBULATORY_CARE_PROVIDER_SITE_OTHER): Payer: Medicare Other | Admitting: *Deleted

## 2016-06-27 DIAGNOSIS — I4891 Unspecified atrial fibrillation: Secondary | ICD-10-CM

## 2016-06-27 DIAGNOSIS — Z7901 Long term (current) use of anticoagulants: Secondary | ICD-10-CM | POA: Diagnosis not present

## 2016-06-27 DIAGNOSIS — I482 Chronic atrial fibrillation, unspecified: Secondary | ICD-10-CM

## 2016-06-27 DIAGNOSIS — Z5181 Encounter for therapeutic drug level monitoring: Secondary | ICD-10-CM | POA: Diagnosis not present

## 2016-06-27 LAB — POCT INR: INR: 2.9

## 2016-08-08 ENCOUNTER — Ambulatory Visit (INDEPENDENT_AMBULATORY_CARE_PROVIDER_SITE_OTHER): Payer: Medicare Other | Admitting: *Deleted

## 2016-08-08 DIAGNOSIS — Z7901 Long term (current) use of anticoagulants: Secondary | ICD-10-CM | POA: Diagnosis not present

## 2016-08-08 DIAGNOSIS — I482 Chronic atrial fibrillation, unspecified: Secondary | ICD-10-CM

## 2016-08-08 DIAGNOSIS — I4891 Unspecified atrial fibrillation: Secondary | ICD-10-CM | POA: Diagnosis not present

## 2016-08-08 DIAGNOSIS — Z5181 Encounter for therapeutic drug level monitoring: Secondary | ICD-10-CM | POA: Diagnosis not present

## 2016-08-08 LAB — POCT INR: INR: 2.7

## 2016-09-04 ENCOUNTER — Ambulatory Visit (HOSPITAL_COMMUNITY)
Admission: RE | Admit: 2016-09-04 | Discharge: 2016-09-04 | Disposition: A | Discharge status: Home / Self Care | Payer: Medicare Other | Source: Ambulatory Visit | Attending: Cardiology | Admitting: Cardiology

## 2016-09-04 DIAGNOSIS — I700 Atherosclerosis of aorta: Secondary | ICD-10-CM | POA: Diagnosis not present

## 2016-09-04 DIAGNOSIS — K769 Liver disease, unspecified: Secondary | ICD-10-CM | POA: Diagnosis not present

## 2016-09-04 DIAGNOSIS — I714 Abdominal aortic aneurysm, without rupture: Secondary | ICD-10-CM | POA: Diagnosis not present

## 2016-09-04 DIAGNOSIS — Z8679 Personal history of other diseases of the circulatory system: Secondary | ICD-10-CM | POA: Diagnosis present

## 2016-09-19 ENCOUNTER — Ambulatory Visit (INDEPENDENT_AMBULATORY_CARE_PROVIDER_SITE_OTHER): Payer: Medicare Other | Admitting: *Deleted

## 2016-09-19 DIAGNOSIS — Z7901 Long term (current) use of anticoagulants: Secondary | ICD-10-CM

## 2016-09-19 DIAGNOSIS — I482 Chronic atrial fibrillation, unspecified: Secondary | ICD-10-CM

## 2016-09-19 DIAGNOSIS — Z5181 Encounter for therapeutic drug level monitoring: Secondary | ICD-10-CM

## 2016-09-19 DIAGNOSIS — I4891 Unspecified atrial fibrillation: Secondary | ICD-10-CM | POA: Diagnosis not present

## 2016-09-19 LAB — POCT INR: INR: 3.6

## 2016-10-04 ENCOUNTER — Encounter: Payer: Self-pay | Admitting: Internal Medicine

## 2016-10-10 ENCOUNTER — Ambulatory Visit (INDEPENDENT_AMBULATORY_CARE_PROVIDER_SITE_OTHER): Payer: Medicare Other | Admitting: *Deleted

## 2016-10-10 DIAGNOSIS — I482 Chronic atrial fibrillation, unspecified: Secondary | ICD-10-CM

## 2016-10-10 DIAGNOSIS — Z5181 Encounter for therapeutic drug level monitoring: Secondary | ICD-10-CM

## 2016-10-10 DIAGNOSIS — I4891 Unspecified atrial fibrillation: Secondary | ICD-10-CM | POA: Diagnosis not present

## 2016-10-10 DIAGNOSIS — Z7901 Long term (current) use of anticoagulants: Secondary | ICD-10-CM

## 2016-10-10 LAB — POCT INR: INR: 2.5

## 2016-11-07 ENCOUNTER — Ambulatory Visit (INDEPENDENT_AMBULATORY_CARE_PROVIDER_SITE_OTHER): Payer: Medicare Other | Admitting: *Deleted

## 2016-11-07 DIAGNOSIS — Z7901 Long term (current) use of anticoagulants: Secondary | ICD-10-CM

## 2016-11-07 DIAGNOSIS — I4891 Unspecified atrial fibrillation: Secondary | ICD-10-CM

## 2016-11-07 DIAGNOSIS — I482 Chronic atrial fibrillation, unspecified: Secondary | ICD-10-CM

## 2016-11-07 DIAGNOSIS — Z5181 Encounter for therapeutic drug level monitoring: Secondary | ICD-10-CM

## 2016-11-07 LAB — POCT INR: INR: 3.3

## 2016-12-05 ENCOUNTER — Ambulatory Visit (INDEPENDENT_AMBULATORY_CARE_PROVIDER_SITE_OTHER): Payer: Medicare Other | Admitting: *Deleted

## 2016-12-05 DIAGNOSIS — Z7901 Long term (current) use of anticoagulants: Secondary | ICD-10-CM | POA: Diagnosis not present

## 2016-12-05 DIAGNOSIS — Z5181 Encounter for therapeutic drug level monitoring: Secondary | ICD-10-CM

## 2016-12-05 DIAGNOSIS — I482 Chronic atrial fibrillation, unspecified: Secondary | ICD-10-CM

## 2016-12-05 DIAGNOSIS — I4891 Unspecified atrial fibrillation: Secondary | ICD-10-CM | POA: Diagnosis not present

## 2016-12-05 LAB — POCT INR: INR: 2.6

## 2017-01-02 ENCOUNTER — Ambulatory Visit (INDEPENDENT_AMBULATORY_CARE_PROVIDER_SITE_OTHER): Payer: Medicare Other | Admitting: *Deleted

## 2017-01-02 DIAGNOSIS — I482 Chronic atrial fibrillation, unspecified: Secondary | ICD-10-CM

## 2017-01-02 DIAGNOSIS — Z7901 Long term (current) use of anticoagulants: Secondary | ICD-10-CM

## 2017-01-02 DIAGNOSIS — Z5181 Encounter for therapeutic drug level monitoring: Secondary | ICD-10-CM | POA: Diagnosis not present

## 2017-01-02 DIAGNOSIS — I4891 Unspecified atrial fibrillation: Secondary | ICD-10-CM | POA: Diagnosis not present

## 2017-01-02 LAB — POCT INR: INR: 3.4

## 2017-01-17 ENCOUNTER — Ambulatory Visit: Payer: Medicare Other | Admitting: Gastroenterology

## 2017-01-17 ENCOUNTER — Encounter: Payer: Self-pay | Admitting: Gastroenterology

## 2017-01-17 VITALS — BP 132/63 | HR 70 | Temp 97.0°F | Ht 68.0 in | Wt 224.4 lb

## 2017-01-17 DIAGNOSIS — D649 Anemia, unspecified: Secondary | ICD-10-CM | POA: Diagnosis not present

## 2017-01-17 DIAGNOSIS — R490 Dysphonia: Secondary | ICD-10-CM | POA: Insufficient documentation

## 2017-01-17 DIAGNOSIS — K219 Gastro-esophageal reflux disease without esophagitis: Secondary | ICD-10-CM | POA: Diagnosis not present

## 2017-01-17 DIAGNOSIS — Z86010 Personal history of colonic polyps: Secondary | ICD-10-CM | POA: Insufficient documentation

## 2017-01-17 DIAGNOSIS — Z860101 Personal history of adenomatous and serrated colon polyps: Secondary | ICD-10-CM

## 2017-01-17 HISTORY — DX: Personal history of colonic polyps: Z86.010

## 2017-01-17 HISTORY — DX: Personal history of adenomatous and serrated colon polyps: Z86.0101

## 2017-01-17 NOTE — Assessment & Plan Note (Signed)
Per Dr. Roseanne Kaufman previous [place] note 2017, I do not feel the risks would be worth the benefits with regarding a future surveillance colonoscopy; however, if patient had any symptoms or problem, diagnostic colonoscopy could be considered. Patient and daughter both in agreement. Will obtain copy of labs from PCP for review.

## 2017-01-17 NOTE — Assessment & Plan Note (Signed)
Typical symptoms well controlled.  Hoarseness/throat clearing can be multifactorial.  He is on high-dose PPI therapy making GERD less likely etiology.  Cannot exclude postnasal drip, vocal cord abnormality and may benefit from ENT evaluation.  To discuss further with Dr. Gala Romney.

## 2017-01-17 NOTE — Progress Notes (Signed)
cc'ed to pcp °

## 2017-01-17 NOTE — Patient Instructions (Signed)
1. I will obtain copy of records from Dr. Anastasio Champion for review.  2. We will let you know next step for work up of hoarseness and if Dr. Gala Romney recommends colonoscopy.

## 2017-01-17 NOTE — Progress Notes (Signed)
Primary Care Physician:  [name], MD  Primary Gastroenterologist:  Garfield Cornea, MD   Chief Complaint  Patient presents with  . Colonoscopy  . Gastroesophageal Reflux    clears throat often, raspy voice, voice seems "higher"    HPI:  Tristan Lopez is a 83 y.o. male here for follow up of GERD and because he received a letter stating he was due for 5 year surveillance colonoscopy. Patient will last seen in February 2018.  He has a history of constipation, controlled with prunes.  Typical reflux well controlled with Protonix 40 mg twice daily.  No dysphagia.  Complains that he has to clear his throat constantly.  This is chronic.  Complaining of it back in February as well.  Also complaining that his voice is changed, more hoarse on the loses his voice frequently.  Had to give up singing at church about a year ago.  Daughter presents with him today.  They cannot recall any ENT evaluations in the last couple of years.  He has been seen for hearing aids.    Denies abdominal pain.  Appetite is good.  No melena or rectal bleeding.  Being treated for PMR with low-dose prednisone, has tapered down to 5 mg daily for maintenance.   Current Outpatient Medications  Medication Sig Dispense Refill  . amLODipine (NORVASC) 5 MG tablet TAKE ONE TABLET BY MOUTH ONCE DAILY 90 tablet 0  . benazepril (LOTENSIN) 40 MG tablet TAKE ONE TABLET BY MOUTH ONCE DAILY 90 tablet 3  . Cholecalciferol (VITAMIN D-3) 1000 units CAPS Take 2,000 Units by mouth.    . finasteride (PROSCAR) 5 MG tablet Take 5 mg by mouth daily.    . Garlic 6578 MG CAPS Take 1,000 mg by mouth daily.    Marland Kitchen oxybutynin (DITROPAN-XL) 10 MG 24 hr tablet Take 10 mg by mouth daily.    . pantoprazole (PROTONIX) 40 MG tablet Take 1 tablet (40 mg total) by mouth 2 (two) times daily. 180 tablet 1  . potassium chloride SA (K-DUR,KLOR-CON) 20 MEQ tablet TAKE ONE TABLET BY MOUTH ONCE DAILY 90 tablet 3  . predniSONE (DELTASONE) 5 MG tablet Take 5  mg by mouth daily.    . propranolol (INDERAL) 20 MG tablet Take 20 mg by mouth daily.     . rosuvastatin (CRESTOR) 10 MG tablet Take 1 tablet (10 mg total) by mouth daily. 90 tablet 1  . Tamsulosin HCl (FLOMAX) 0.4 MG CAPS Take 0.4 mg by mouth daily.    Marland Kitchen warfarin (COUMADIN) 5 MG tablet Take 1 tablet daily except 1/2 tablet on Wednesdays (Patient taking differently: Take 1 tablet daily except 1/2 tablet on Sundays/Wednesdays) 100 tablet 3  . losartan-hydrochlorothiazide (HYZAAR) 100-25 MG tablet TAKE ONE TABLET BY MOUTH ONCE DAILY (Patient not taking: Reported on 01/17/2017) 90 tablet 3   No current facility-administered medications for this visit.     Allergies as of 01/17/2017 - Review Complete 01/17/2017  Allergen Reaction Noted  . Latex Rash 10/04/2011    Past Medical History:  Diagnosis Date  . Abdominal aortic aneurysm without rupture ([place])   . Bilateral renal cysts   . BPH (benign prostatic hyperplasia)   . Chronic atrial fibrillation ([place])   . COPD (chronic obstructive pulmonary disease) ([place])   . Diverticulosis    Pancolonic  . Essential hypertension   . Hemorrhoids   . Hiatal hernia   . Hypercholesteremia   . Liver cyst    Benign by liver biopsy September 2013  .  Nephrolithiasis   . Pre-diabetes   . Reflux esophagitis   . Sleep apnea    Uses CPAP  . Tubular adenoma     Past Surgical History:  Procedure Laterality Date  . COLONOSCOPY     2003, no polyps per patient. Dr. West Carbo  . COLONOSCOPY  11/01/11   Dr. Gala Romney- tubular adenoma,suboptimal preparation, internal hemorrhoids, o/w normal rectum. pancolonic diverticulosis  . ESOPHAGOGASTRODUODENOSCOPY  11/01/11   Dr. Gala Romney- erosive reflux esophagitis along with a patulous EG junction, hialtal hernia, antral erosions with possible area of healing ulceration- s/p bx= chronic erosive gastritis, no malignancy  . HERNIA REPAIR     x2, bilateral inguinal   . KIDNEY STONE SURGERY    . KNEE ARTHROSCOPY     X2  .  NASAL ENDOSCOPY      Family History  Problem Relation Age of Onset  . Breast cancer Mother   . Heart disease Father        Pacemaker  . Colon cancer Neg Hx   . Liver disease Neg Hx     Social History   Socioeconomic History  . Marital status: Married    Spouse name: Not on file  . Number of children: 3  . Years of education: Not on file  . Highest education level: Not on file  Social Needs  . Financial resource strain: Not on file  . Food insecurity - worry: Not on file  . Food insecurity - inability: Not on file  . Transportation needs - medical: Not on file  . Transportation needs - non-medical: Not on file  Occupational History    Employer: RETIRED  Tobacco Use  . Smoking status: Former Smoker    Packs/day: 0.50    Types: Cigarettes  . Smokeless tobacco: Never Used  . Tobacco comment: quit about 10 years  Substance and Sexual Activity  . Alcohol use: No  . Drug use: No  . Sexual activity: Not on file  Other Topics Concern  . Not on file  Social History Narrative  . Not on file      ROS:  General: Negative for anorexia, weight loss, fever, chills, fatigue, weakness. Eyes: Negative for vision changes.  ENT: Negative for  difficulty swallowing , nasal congestion. See hpi. CV: Negative for chest pain, angina, palpitations, dyspnea on exertion, peripheral edema.  Respiratory: Negative for dyspnea at rest, dyspnea on exertion, cough, sputum, wheezing.  GI: See history of present illness. GU:  Negative for dysuria, hematuria, urinary incontinence, urinary frequency, nocturnal urination.  MS: Negative for joint pain, low back pain.  Derm: Negative for rash or itching.  Neuro: Negative for weakness, abnormal sensation, seizure, frequent headaches, memory loss, confusion.  Psych: Negative for anxiety, depression, suicidal ideation, hallucinations.  Endo: Negative for unusual weight change.  Heme: Negative for bruising or bleeding. Allergy: Negative for rash or  hives.    Physical Examination:  BP 132/63   Pulse 70   Temp (!) 97 F (36.1 C) (Oral)   Ht 5\' 8"  (1.727 m)   Wt 224 lb 6.4 oz (101.8 kg)   BMI 34.12 kg/m    General: Well-nourished, well-developed in no acute distress.  Head: Normocephalic, atraumatic.   Eyes: Conjunctiva pink, no icterus. Mouth: Oropharyngeal mucosa moist and pink , no lesions erythema or exudate. Neck: Supple without thyromegaly, masses, or lymphadenopathy.  Lungs: Clear to auscultation bilaterally.  Heart: Regular rate and rhythm, no murmurs rubs or gallops.  Abdomen: Bowel sounds are normal, nontender, nondistended, no hepatosplenomegaly  or masses, no abdominal bruits or    hernia , no rebound or guarding.   Rectal: not performed Extremities: No lower extremity edema. No clubbing or deformities.  Neuro: Alert and oriented x 4 , grossly normal neurologically.  Skin: Warm and dry, no rash or jaundice.   Psych: Alert and cooperative, normal mood and affect.  Labs: Lab Results  Component Value Date   INR 3.4 01/02/2017   INR 2.6 12/05/2016   INR 3.3 11/07/2016       Imaging Studies: No results found.

## 2017-01-29 ENCOUNTER — Telehealth: Payer: Self-pay

## 2017-01-29 ENCOUNTER — Other Ambulatory Visit: Payer: Self-pay

## 2017-01-29 DIAGNOSIS — D649 Anemia, unspecified: Secondary | ICD-10-CM

## 2017-01-29 DIAGNOSIS — R490 Dysphonia: Secondary | ICD-10-CM

## 2017-01-29 NOTE — Telephone Encounter (Signed)
Per [facility] OV addendum: 2. Dr. Gala Romney recommends ENT referral for hoarseness, voice issues. Please arrange if patient is in agreement.   Referral info faxed to Dr. Deeann Saint office.

## 2017-01-29 NOTE — Progress Notes (Signed)
Pt notified and will pick orders up 01/30/17. Pt has an appointment and will have lab work done.

## 2017-01-29 NOTE — Progress Notes (Signed)
Orders were put in by [facility]. Orders were mailed to pt.

## 2017-01-29 NOTE — Telephone Encounter (Signed)
Reminder in epic °

## 2017-01-29 NOTE — Addendum Note (Signed)
Addended by: Mahala Menghini on: 01/29/2017 09:41 AM   Modules accepted: Orders

## 2017-01-29 NOTE — Telephone Encounter (Signed)
Tristan Lopez Please NIC appointment for 1 year with RMR per [facility].

## 2017-01-29 NOTE — Progress Notes (Addendum)
Please let patient know that I reviewed records from PCP.  Labs from September 2018 with normal LFTs. NO CBC AVAILABLE.   1. WOULD RECOMMEND CBC AND IF NO ANEMIA, THEN NO COLONOSCOPY NEEDED.  Discussed with Dr. Gala Romney. If no problems, ie bowel concerns, bleeding, anemia, then he does not recommend colonoscopy. Risks of procedure outweigh benefits given age, comorbidities, in the setting of surveillance only. AWAIT CBC AS ABOVE.  2. Dr. Gala Romney recommends ENT referral for hoarseness, voice issues. Please arrange if patient is in agreement.   3. Return to the office in one year or sooner if needed. Please NIC for OV with RMR in one year.

## 2017-01-30 ENCOUNTER — Ambulatory Visit (INDEPENDENT_AMBULATORY_CARE_PROVIDER_SITE_OTHER): Payer: Medicare Other | Admitting: *Deleted

## 2017-01-30 DIAGNOSIS — I482 Chronic atrial fibrillation, unspecified: Secondary | ICD-10-CM

## 2017-01-30 DIAGNOSIS — Z7901 Long term (current) use of anticoagulants: Secondary | ICD-10-CM | POA: Diagnosis not present

## 2017-01-30 DIAGNOSIS — Z5181 Encounter for therapeutic drug level monitoring: Secondary | ICD-10-CM

## 2017-01-30 DIAGNOSIS — I4891 Unspecified atrial fibrillation: Secondary | ICD-10-CM

## 2017-01-30 LAB — CBC WITH DIFFERENTIAL/PLATELET
Basophils Absolute: 11 cells/uL (ref 0–200)
Basophils Relative: 0.2 %
EOS PCT: 0.4 %
Eosinophils Absolute: 22 cells/uL (ref 15–500)
HEMATOCRIT: 39.3 % (ref 38.5–50.0)
Hemoglobin: 13.3 g/dL (ref 13.2–17.1)
LYMPHS ABS: 1095 {cells}/uL (ref 850–3900)
MCH: 30.6 pg (ref 27.0–33.0)
MCHC: 33.8 g/dL (ref 32.0–36.0)
MCV: 90.6 fL (ref 80.0–100.0)
MPV: 9.8 fL (ref 7.5–12.5)
Monocytes Relative: 7.2 %
NEUTROS PCT: 72.3 %
Neutro Abs: 3977 cells/uL (ref 1500–7800)
PLATELETS: 180 10*3/uL (ref 140–400)
RBC: 4.34 10*6/uL (ref 4.20–5.80)
RDW: 12.4 % (ref 11.0–15.0)
Total Lymphocyte: 19.9 %
WBC mixed population: 396 cells/uL (ref 200–950)
WBC: 5.5 10*3/uL (ref 3.8–10.8)

## 2017-01-30 LAB — POCT INR: INR: 1.7

## 2017-01-30 NOTE — Patient Instructions (Signed)
Increase dose to 1 tablet daily except 1/2 tablet on Sundays   Continue greens. Recheck in 4 weeks.

## 2017-01-30 NOTE — Progress Notes (Signed)
ON RECALL  °

## 2017-01-31 NOTE — Progress Notes (Signed)
Please let patient know he has normal Hemoglobin. As before, no need for colonoscopy unless he develops bowel concerns or anemia.

## 2017-01-31 NOTE — Telephone Encounter (Signed)
Patient is aware of referral and is in agreement with ENT referral

## 2017-02-20 ENCOUNTER — Encounter: Payer: Self-pay | Admitting: Cardiology

## 2017-02-20 ENCOUNTER — Ambulatory Visit (INDEPENDENT_AMBULATORY_CARE_PROVIDER_SITE_OTHER): Payer: Medicare Other | Admitting: *Deleted

## 2017-02-20 ENCOUNTER — Ambulatory Visit: Payer: Medicare Other | Admitting: Cardiology

## 2017-02-20 VITALS — BP 130/60 | HR 59 | Ht 68.0 in | Wt 230.0 lb

## 2017-02-20 DIAGNOSIS — Z7901 Long term (current) use of anticoagulants: Secondary | ICD-10-CM

## 2017-02-20 DIAGNOSIS — Z8679 Personal history of other diseases of the circulatory system: Secondary | ICD-10-CM

## 2017-02-20 DIAGNOSIS — Z5181 Encounter for therapeutic drug level monitoring: Secondary | ICD-10-CM

## 2017-02-20 DIAGNOSIS — I482 Chronic atrial fibrillation, unspecified: Secondary | ICD-10-CM

## 2017-02-20 DIAGNOSIS — E782 Mixed hyperlipidemia: Secondary | ICD-10-CM

## 2017-02-20 DIAGNOSIS — I4891 Unspecified atrial fibrillation: Secondary | ICD-10-CM

## 2017-02-20 DIAGNOSIS — I10 Essential (primary) hypertension: Secondary | ICD-10-CM

## 2017-02-20 LAB — POCT INR: INR: 1.8

## 2017-02-20 NOTE — Patient Instructions (Signed)
Take 1 1/2 tablets tonight then increase dose to 1 tablet daily Cut back on greens a little Recheck in 4 weeks.

## 2017-02-20 NOTE — Patient Instructions (Signed)
Your physician wants you to follow-up in: 1 year with Dr.McDowell You will receive a reminder letter in the mail two months in advance. If you don't receive a letter, please call our office to schedule the follow-up appointment.    Your physician recommends that you continue on your current medications as directed. Please refer to the Current Medication list given to you today.    If you need a refill on your cardiac medications before your next appointment, please call your pharmacy.      No testing or lab work ordered today.     Thank you for choosing [facility] !

## 2017-02-20 NOTE — Progress Notes (Signed)
Cardiology Office Note  Date: 02/20/2017   ID: ARSALAN BRISBIN, DOB 1930-10-04, MRN [id number]  PCP: [name], MD  Primary Cardiologist: [name], MD   Chief Complaint  Patient presents with  . Atrial Fibrillation    History of Present Illness: YVONNE PETITE is an 84 y.o. male last seen in February 2018.  He is here today with 1 of his sons for a follow-up visit.  Since last assessment he does not report any major decline in stamina.  Still enjoys working outdoors on his property.  He does not report any exertional chest pain.  Has had stable NYHA class II dyspnea with most activities.  Does not report any significant palpitations, no dizziness or syncope.  He is having some trouble with right groin pain, known inguinal hernia on that side.  He is contemplating seeking a surgical opinion about operative repair.  He continues to follow in the anticoagulation clinic on Coumadin.  I personally reviewed his ECG today which shows atrial fibrillation in the 50s, diffuse nonspecific ST-T changes.  He does not report any spontaneous bleeding problems.  Follow-up abdominal ultrasound from August 2018 indicated fairly stable AAA measuring 3.5 x 3.5 cm. Follow-up recommended in 2 years.  Past Medical History:  Diagnosis Date  . Abdominal aortic aneurysm without rupture ([place])   . Bilateral renal cysts   . BPH (benign prostatic hyperplasia)   . Chronic atrial fibrillation ([place])   . COPD (chronic obstructive pulmonary disease) ([place])   . Diverticulosis    Pancolonic  . Essential hypertension   . Hemorrhoids   . Hiatal hernia   . Hypercholesteremia   . Liver cyst    Benign by liver biopsy September 2013  . Nephrolithiasis   . Pre-diabetes   . Reflux esophagitis   . Sleep apnea    Uses CPAP  . Tubular adenoma     Past Surgical History:  Procedure Laterality Date  . COLONOSCOPY     2003, no polyps per patient. Dr. West Carbo  . COLONOSCOPY  11/01/11   Dr.  Gala Romney- tubular adenoma,suboptimal preparation, internal hemorrhoids, o/w normal rectum. pancolonic diverticulosis  . ESOPHAGOGASTRODUODENOSCOPY  11/01/11   Dr. Gala Romney- erosive reflux esophagitis along with a patulous EG junction, hialtal hernia, antral erosions with possible area of healing ulceration- s/p bx= chronic erosive gastritis, no malignancy  . HERNIA REPAIR     x2, bilateral inguinal   . KIDNEY STONE SURGERY    . KNEE ARTHROSCOPY     X2  . NASAL ENDOSCOPY      Current Outpatient Medications  Medication Sig Dispense Refill  . amLODipine (NORVASC) 5 MG tablet TAKE ONE TABLET BY MOUTH ONCE DAILY 90 tablet 0  . benazepril (LOTENSIN) 40 MG tablet TAKE ONE TABLET BY MOUTH ONCE DAILY 90 tablet 3  . Cholecalciferol (VITAMIN D-3) 1000 units CAPS Take 2,000 Units by mouth.    . finasteride (PROSCAR) 5 MG tablet Take 5 mg by mouth daily.    . Garlic 6301 MG CAPS Take 1,000 mg by mouth daily.    Marland Kitchen oxybutynin (DITROPAN-XL) 10 MG 24 hr tablet Take 10 mg by mouth daily.    . pantoprazole (PROTONIX) 40 MG tablet Take 1 tablet (40 mg total) by mouth 2 (two) times daily. 180 tablet 1  . potassium chloride SA (K-DUR,KLOR-CON) 20 MEQ tablet TAKE ONE TABLET BY MOUTH ONCE DAILY 90 tablet 3  . predniSONE (DELTASONE) 5 MG tablet Take 5 mg by mouth daily.    Marland Kitchen  propranolol (INDERAL) 20 MG tablet Take 20 mg by mouth daily.     . rosuvastatin (CRESTOR) 10 MG tablet Take 1 tablet (10 mg total) by mouth daily. 90 tablet 1  . Tamsulosin HCl (FLOMAX) 0.4 MG CAPS Take 0.4 mg by mouth daily.    Marland Kitchen warfarin (COUMADIN) 5 MG tablet Take 1 tablet daily except 1/2 tablet on Wednesdays (Patient taking differently: Take 1 tablet daily except 1/2 tablet on Sundays/Wednesdays) 100 tablet 3   No current facility-administered medications for this visit.    Allergies:  Latex   Social History: The patient  reports that he has quit smoking. His smoking use included cigarettes. He smoked 0.50 packs per day. he has never used  smokeless tobacco. He reports that he does not drink alcohol or use drugs.   ROS:  Please see the history of present illness. Otherwise, complete review of systems is positive for leg and lower back pain intermittently.  All other systems are reviewed and negative.   Physical Exam: VS:  BP 130/60 (BP Location: Left Arm)   Pulse (!) 59   Ht 5\' 8"  (1.727 m)   Wt 230 lb (104.3 kg)   SpO2 97%   BMI 34.97 kg/m , BMI Body mass index is 34.97 kg/m.  Wt Readings from Last 3 Encounters:  02/20/17 230 lb (104.3 kg)  01/17/17 224 lb 6.4 oz (101.8 kg)  03/20/16 237 lb 6.4 oz (107.7 kg)    General: Overweight elderly male, appears comfortable at rest. HEENT: Conjunctiva and lids normal, oropharynx clear. Neck: Supple, no elevated JVP or carotid bruits, no thyromegaly. Lungs: Clear to auscultation, nonlabored breathing at rest. Cardiac: Irregularly irregular, no S3, soft systolic murmur, no pericardial rub. Abdomen: Soft, nontender, bowel sounds present, no guarding or rebound. Extremities: Trace ankle edema, distal pulses 2+. Skin: Warm and dry. Musculoskeletal: No kyphosis. Neuropsychiatric: Alert and oriented x3, affect grossly appropriate.  ECG: I personally reviewed the tracing from 03/07/2016 which showed rate-controlled atrial fibrillation.  Recent Labwork: 01/30/2017: Hemoglobin 13.3; Platelets 180   Other Studies Reviewed Today:  Echocardiogram 09/16/2014 (Dr. Anastasio Champion office): LVH with preserved LVEF 66%, ungraded diastolic dysfunction, no significant valvular abnormalities.  Abdominal ultrasound 09/04/2016: IMPRESSION: Abdominal aortic aneurysm minimally changed from prior exam with maximal transverse dimension of 3.5 x 3.5 cm versus 06/16/2014 exam when this measured 3.3 x 3.5 cm. Recommend followup by ultrasound in 2 years. This recommendation follows ACR consensus guidelines: White Paper of the ACR Incidental Findings Committee II on Vascular Findings. J Am Coll Radiol 2013;  10:789-794.  Aortic Atherosclerosis (ICD10-I70.0).  Assessment and Plan:  1.  Chronic atrial fibrillation.  He is asymptomatic without palpitations.  Plan to continue Coumadin with follow-up in the anticoagulation clinic.  ECG reviewed today.  2.  Abdominal aortic aneurysm measuring 3.5 x 3.5 cm by follow-up ultrasound last summer.  He is asymptomatic.  3.  Essential hypertension, follows with Dr. Anastasio Champion.  He continues on Norvasc and Lotensin.  4.  Mixed hyperlipidemia.  Continues on Crestor with follow-up per Dr. Anastasio Champion.  5.  Patient considering surgical consultation regarding herniorrhaphy.  I would not anticipate any major cardiac limitations to proceeding, he has been stable with atrial fibrillation for quite some time.  Coumadin would have to be held temporarily.  We would obtain an updated echocardiogram prior to proceeding.  He will let us know.  Current medicines were reviewed with the patient today.   Orders Placed This Encounter  Procedures  . EKG 12-Lead  . EKG  12-Lead    Disposition: Follow-up in 1 year, sooner if needed.  Signed, Satira Sark, MD, [facility] 02/20/2017 1:24 PM    [place] [facility] [address] [place], [place] [address] Phone: [phone]; Fax: [phone]

## 2017-02-21 ENCOUNTER — Ambulatory Visit (INDEPENDENT_AMBULATORY_CARE_PROVIDER_SITE_OTHER): Payer: Medicare Other | Admitting: Otolaryngology

## 2017-02-21 DIAGNOSIS — R490 Dysphonia: Secondary | ICD-10-CM | POA: Diagnosis not present

## 2017-02-21 DIAGNOSIS — J3801 Paralysis of vocal cords and larynx, unilateral: Secondary | ICD-10-CM

## 2017-02-26 ENCOUNTER — Other Ambulatory Visit (INDEPENDENT_AMBULATORY_CARE_PROVIDER_SITE_OTHER): Payer: Self-pay | Admitting: Otolaryngology

## 2017-02-26 DIAGNOSIS — J3800 Paralysis of vocal cords and larynx, unspecified: Secondary | ICD-10-CM

## 2017-02-27 ENCOUNTER — Ambulatory Visit
Admission: RE | Admit: 2017-02-27 | Discharge: 2017-02-27 | Disposition: A | Discharge status: Home / Self Care | Payer: Medicare Other | Source: Ambulatory Visit | Attending: Otolaryngology | Admitting: Otolaryngology

## 2017-02-27 DIAGNOSIS — J3800 Paralysis of vocal cords and larynx, unspecified: Secondary | ICD-10-CM

## 2017-02-27 MED ORDER — IOPAMIDOL (ISOVUE-300) INJECTION 61%
75.0000 mL | Freq: Once | INTRAVENOUS | Status: AC | PRN
  Administered 2017-02-27: 75 mL via INTRAVENOUS

## 2017-03-14 ENCOUNTER — Ambulatory Visit (INDEPENDENT_AMBULATORY_CARE_PROVIDER_SITE_OTHER): Payer: Medicare Other | Admitting: Otolaryngology

## 2017-03-14 ENCOUNTER — Encounter: Payer: Self-pay | Admitting: Cardiology

## 2017-03-14 DIAGNOSIS — R490 Dysphonia: Secondary | ICD-10-CM

## 2017-03-14 DIAGNOSIS — K219 Gastro-esophageal reflux disease without esophagitis: Secondary | ICD-10-CM | POA: Diagnosis not present

## 2017-03-20 ENCOUNTER — Ambulatory Visit (INDEPENDENT_AMBULATORY_CARE_PROVIDER_SITE_OTHER): Payer: Medicare Other | Admitting: *Deleted

## 2017-03-20 DIAGNOSIS — I482 Chronic atrial fibrillation, unspecified: Secondary | ICD-10-CM

## 2017-03-20 DIAGNOSIS — Z7901 Long term (current) use of anticoagulants: Secondary | ICD-10-CM

## 2017-03-20 DIAGNOSIS — I4891 Unspecified atrial fibrillation: Secondary | ICD-10-CM | POA: Diagnosis not present

## 2017-03-20 DIAGNOSIS — Z5181 Encounter for therapeutic drug level monitoring: Secondary | ICD-10-CM

## 2017-03-20 LAB — POCT INR: INR: 3.2

## 2017-03-20 NOTE — Patient Instructions (Signed)
Take 1/2 tablet tonight then continue 1 tablet daily Increase greens Recheck in 4 weeks.

## 2017-03-21 ENCOUNTER — Other Ambulatory Visit: Payer: Self-pay

## 2017-03-21 ENCOUNTER — Ambulatory Visit (HOSPITAL_COMMUNITY)
Admission: RE | Admit: 2017-03-21 | Discharge: 2017-03-21 | Disposition: A | Discharge status: Home / Self Care | Payer: Medicare Other | Source: Ambulatory Visit | Attending: Cardiology | Admitting: Cardiology

## 2017-03-21 DIAGNOSIS — I6523 Occlusion and stenosis of bilateral carotid arteries: Secondary | ICD-10-CM

## 2017-03-22 ENCOUNTER — Telehealth: Payer: Self-pay

## 2017-03-22 DIAGNOSIS — I6523 Occlusion and stenosis of bilateral carotid arteries: Secondary | ICD-10-CM

## 2017-03-22 NOTE — Telephone Encounter (Signed)
Wife notified, spouse unavailable, gave her number to [facility], mailed copy report to pt, copied pcp

## 2017-03-25 ENCOUNTER — Other Ambulatory Visit: Payer: Self-pay

## 2017-03-25 DIAGNOSIS — I6521 Occlusion and stenosis of right carotid artery: Secondary | ICD-10-CM

## 2017-03-26 ENCOUNTER — Encounter (HOSPITAL_COMMUNITY): Payer: Medicare Other

## 2017-03-27 ENCOUNTER — Encounter (HOSPITAL_COMMUNITY): Payer: Medicare Other

## 2017-04-03 ENCOUNTER — Other Ambulatory Visit: Payer: Self-pay

## 2017-04-03 ENCOUNTER — Ambulatory Visit: Payer: Medicare Other | Admitting: Vascular Surgery

## 2017-04-03 ENCOUNTER — Encounter: Payer: Self-pay | Admitting: Vascular Surgery

## 2017-04-03 ENCOUNTER — Ambulatory Visit (HOSPITAL_COMMUNITY)
Admission: RE | Admit: 2017-04-03 | Discharge: 2017-04-03 | Disposition: A | Discharge status: Home / Self Care | Payer: Medicare Other | Source: Ambulatory Visit | Attending: Surgery | Admitting: Surgery

## 2017-04-03 VITALS — BP 134/74 | HR 60 | Temp 98.5°F | Resp 16 | Ht 68.0 in | Wt 218.0 lb

## 2017-04-03 DIAGNOSIS — I6523 Occlusion and stenosis of bilateral carotid arteries: Secondary | ICD-10-CM | POA: Diagnosis not present

## 2017-04-03 DIAGNOSIS — I6521 Occlusion and stenosis of right carotid artery: Secondary | ICD-10-CM

## 2017-04-03 NOTE — Progress Notes (Signed)
Patient name: Tristan Lopez MRN: [id number] DOB: August 22, 1930 Sex: male   REASON FOR CONSULT:    Right carotid stenosis.  The consult is requested by Dr. Domenic Polite  HPI:   Tristan Lopez is a pleasant 84 y.o. male, who was referred with a greater than 70% right carotid stenosis.  The patient has had some problems with hoarseness which prompted a CT of the neck.  The CT suggested bilateral carotid stenoses which prompted a carotid duplex scan.  This showed a greater than 70% right carotid stenosis.  The patient is right-handed.  He denies any history of stroke, TIAs, expressive or receptive aphasia, or amaurosis fugax.  He has had some hoarseness and apparently has some intermittent vocal cord paralysis.  He has a history of a small abdominal aortic aneurysm.  He denies abdominal pain or back pain.  He denies any claudication, rest pain, or nonhealing ulcers.  I reviewed the records from the referring office.  The patient was last seen on 02/20/2017.  He is a very active 84 year old gentleman and enjoys working outdoors on his property.  He was not having any chest pain at that time.  He is on Coumadin for atrial fibrillation.  He has a known 3.5 cm small abdominal aortic aneurysm.  This is being followed by cardiology at 2-year intervals. He had a carotid duplex scan done on 03/21/2017.  This suggested a greater than 70% right carotid stenosis and he was sent for vascular consultation.  Past Medical History:  Diagnosis Date  . Abdominal aortic aneurysm without rupture ([place])   . Bilateral renal cysts   . BPH (benign prostatic hyperplasia)   . Chronic atrial fibrillation ([place])   . COPD (chronic obstructive pulmonary disease) ([place])   . Diverticulosis    Pancolonic  . Essential hypertension   . Hemorrhoids   . Hiatal hernia   . Hypercholesteremia   . Liver cyst    Benign by liver biopsy September 2013  . Nephrolithiasis   . Pre-diabetes   . Reflux esophagitis   . Sleep apnea    Uses CPAP  . Tubular adenoma     Family History  Problem Relation Age of Onset  . Breast cancer Mother   . Heart disease Father        Pacemaker  . Colon cancer Neg Hx   . Liver disease Neg Hx     SOCIAL HISTORY: Social History   Socioeconomic History  . Marital status: Married    Spouse name: Not on file  . Number of children: 3  . Years of education: Not on file  . Highest education level: Not on file  Social Needs  . Financial resource strain: Not on file  . Food insecurity - worry: Not on file  . Food insecurity - inability: Not on file  . Transportation needs - medical: Not on file  . Transportation needs - non-medical: Not on file  Occupational History    Employer: RETIRED  Tobacco Use  . Smoking status: Former Smoker    Packs/day: 0.50    Types: Cigarettes    Last attempt to quit: 04/03/1997    Years since quitting: 20.0  . Smokeless tobacco: Never Used  Substance and Sexual Activity  . Alcohol use: No  . Drug use: No  . Sexual activity: Not on file  Other Topics Concern  . Not on file  Social History Narrative  . Not on file    Allergies  Allergen Reactions  .  Latex Rash    Current Outpatient Medications  Medication Sig Dispense Refill  . amLODipine (NORVASC) 5 MG tablet TAKE ONE TABLET BY MOUTH ONCE DAILY 90 tablet 0  . benazepril (LOTENSIN) 40 MG tablet TAKE ONE TABLET BY MOUTH ONCE DAILY 90 tablet 3  . Cholecalciferol (VITAMIN D-3) 1000 units CAPS Take 2,000 Units by mouth.    . finasteride (PROSCAR) 5 MG tablet Take 5 mg by mouth daily.    . Garlic 1696 MG CAPS Take 1,000 mg by mouth daily.    Marland Kitchen oxybutynin (DITROPAN-XL) 10 MG 24 hr tablet Take 10 mg by mouth daily.    . pantoprazole (PROTONIX) 40 MG tablet Take 1 tablet (40 mg total) by mouth 2 (two) times daily. 180 tablet 1  . potassium chloride SA (K-DUR,KLOR-CON) 20 MEQ tablet TAKE ONE TABLET BY MOUTH ONCE DAILY 90 tablet 3  . predniSONE (DELTASONE) 5 MG tablet Take 7.5 mg by mouth daily.      . propranolol (INDERAL) 20 MG tablet Take 20 mg by mouth daily.     . ranitidine (ZANTAC) 150 MG tablet Take 150 mg by mouth 2 (two) times daily.    . rosuvastatin (CRESTOR) 10 MG tablet Take 1 tablet (10 mg total) by mouth daily. 90 tablet 1  . Tamsulosin HCl (FLOMAX) 0.4 MG CAPS Take 0.4 mg by mouth daily.    Marland Kitchen warfarin (COUMADIN) 5 MG tablet Take 1 tablet daily except 1/2 tablet on Wednesdays (Patient taking differently: Take 1 tablet daily except 1/2 tablet on Sundays/Wednesdays) 100 tablet 3   No current facility-administered medications for this visit.     REVIEW OF SYSTEMS:  [X]  denotes positive finding, [ ]  denotes negative finding Cardiac  Comments:  Chest pain or chest pressure:    Shortness of breath upon exertion:    Short of breath when lying flat:    Irregular heart rhythm: x       Vascular    Pain in calf, thigh, or hip brought on by ambulation:    Pain in feet at night that wakes you up from your sleep:     Blood clot in your veins:    Leg swelling:         Pulmonary    Oxygen at home:    Productive cough:     Wheezing:         Neurologic    Sudden weakness in arms or legs:     Sudden numbness in arms or legs:     Sudden onset of difficulty speaking or slurred speech:    Temporary loss of vision in one eye:     Problems with dizziness:         Gastrointestinal    Blood in stool:     Vomited blood:         Genitourinary    Burning when urinating:     Blood in urine:        Psychiatric    Major depression:         Hematologic    Bleeding problems:    Problems with blood clotting too easily:        Skin    Rashes or ulcers:        Constitutional    Fever or chills:     PHYSICAL EXAM:   Vitals:   04/03/17 1327 04/03/17 1335  BP: 139/72 134/74  Pulse: 60   Resp: 16   Temp: 98.5 F (36.9 C)   TempSrc: Oral  SpO2: 96%   Weight: 218 lb (98.9 kg)   Height: 5\' 8"  (1.727 m)     GENERAL: The patient is a well-nourished male, in no acute  distress. The vital signs are documented above. CARDIAC: There is a regular rate and rhythm.  VASCULAR: I do not detect carotid bruits. Patient has palpable femoral popliteal and pedal pulses bilaterally. He has mild bilateral lower extremity swelling. PULMONARY: There is good air exchange bilaterally without wheezing or rales. ABDOMEN: Soft and non-tender with normal pitched bowel sounds.  I cannot palpate his small abdominal aortic aneurysm. MUSCULOSKELETAL: There are no major deformities or cyanosis. NEUROLOGIC: No focal weakness or paresthesias are detected. SKIN: There are no ulcers or rashes noted. PSYCHIATRIC: The patient has a normal affect.  DATA:    CAROTID DUPLEX: I reviewed the carotid duplex scan that was done on 03/21/2017.  On the right side in the internal carotid artery peak systolic velocity was 161 cm/s with an end-diastolic velocity of 46 cm/s.  I would classify this is a 60-79% stenosis.  On the left side, the peak systolic velocity in the internal carotid artery was 203 cm/s with an end-diastolic velocity of 54 cm/s.  I would classify this is a 40-59% stenosis.  CAROTID DUPLEX: I have independently interpreted the carotid duplex scan done in our office today.  On the right side there is a 60-79% stenosis.  End-diastolic velocity on the right is 79 cm/s so I think this is clearly less than 80%.  On the left side there is a 40-59% stenosis.    Both vertebral arteries are patent with antegrade flow.  CT NECK: I reviewed the CT of his neck which shows significant atherosclerotic disease in both carotid arteries.  The arteries are very calcified making it difficult to determine the severity of the stenosis based on this study.  MEDICAL ISSUES:   ASYMPTOMATIC BILATERAL CAROTID STENOSES: This patient has an asymptomatic 60-79% right carotid stenosis and a 40-59% left carotid stenosis.  I have explained that we would not consider carotid endarterectomy unless the stenosis  progressed to greater than 80% or he develop new neurologic symptoms.  I have recommended a follow-up carotid duplex scan in 6 months.  It would be more convenient for him to have this done at Dr. Myles Gip office I think is perfectly reasonable to continue his 69-month follow-up carotid duplex scans they are.  If the stenosis progresses to greater than 80% or he develops any new neurologic symptoms then I would be happy to see him back.  He is on Coumadin and therefore is currently not on aspirin.  If it would be safe to add 81 mg of aspirin a day I would favor that.  We have also discussed the importance of nutrition and exercise.  He is in very good shape for his age and remains very active.  ABDOMINAL AORTIC ANEURYSM: I explained we would consider elective repair in a normal risk patient if the aneurysm enlarged to greater than 5.5 cm.  This is being followed at 2 year intervals.  Deitra Mayo [facility] [phone]

## 2017-04-08 ENCOUNTER — Encounter: Payer: Medicare Other | Admitting: Surgery

## 2017-04-17 ENCOUNTER — Ambulatory Visit (INDEPENDENT_AMBULATORY_CARE_PROVIDER_SITE_OTHER): Payer: Medicare Other | Admitting: *Deleted

## 2017-04-17 DIAGNOSIS — I482 Chronic atrial fibrillation, unspecified: Secondary | ICD-10-CM

## 2017-04-17 DIAGNOSIS — Z5181 Encounter for therapeutic drug level monitoring: Secondary | ICD-10-CM | POA: Diagnosis not present

## 2017-04-17 LAB — POCT INR: INR: 3.7

## 2017-04-17 NOTE — Patient Instructions (Signed)
Hold coumadin tonight then decrease dose to 1 tablet daily except 1/2 tablet on Sundays Increase greens Recheck in 3 weeks.

## 2017-05-08 ENCOUNTER — Ambulatory Visit (INDEPENDENT_AMBULATORY_CARE_PROVIDER_SITE_OTHER): Payer: Medicare Other | Admitting: *Deleted

## 2017-05-08 DIAGNOSIS — Z5181 Encounter for therapeutic drug level monitoring: Secondary | ICD-10-CM

## 2017-05-08 DIAGNOSIS — I482 Chronic atrial fibrillation, unspecified: Secondary | ICD-10-CM

## 2017-05-08 LAB — POCT INR: INR: 2.4

## 2017-05-08 NOTE — Patient Instructions (Signed)
Continue coumadin 1 tablet daily except 1/2 tablet on Sundays Continue greens Recheck in 4 weeks. On Prednisone 7.5mg  daily

## 2017-06-05 ENCOUNTER — Ambulatory Visit (INDEPENDENT_AMBULATORY_CARE_PROVIDER_SITE_OTHER): Payer: Medicare Other | Admitting: *Deleted

## 2017-06-05 DIAGNOSIS — I482 Chronic atrial fibrillation, unspecified: Secondary | ICD-10-CM

## 2017-06-05 DIAGNOSIS — Z5181 Encounter for therapeutic drug level monitoring: Secondary | ICD-10-CM | POA: Diagnosis not present

## 2017-06-05 LAB — POCT INR: INR: 2.1

## 2017-06-05 NOTE — Patient Instructions (Signed)
Continue coumadin 1 tablet daily except 1/2 tablet on Sundays Continue greens Recheck in 4 weeks 

## 2017-06-10 ENCOUNTER — Other Ambulatory Visit: Payer: Self-pay | Admitting: Cardiology

## 2017-07-03 ENCOUNTER — Ambulatory Visit (INDEPENDENT_AMBULATORY_CARE_PROVIDER_SITE_OTHER): Payer: Medicare Other | Admitting: *Deleted

## 2017-07-03 DIAGNOSIS — I482 Chronic atrial fibrillation, unspecified: Secondary | ICD-10-CM

## 2017-07-03 DIAGNOSIS — Z5181 Encounter for therapeutic drug level monitoring: Secondary | ICD-10-CM | POA: Diagnosis not present

## 2017-07-03 LAB — POCT INR: INR: 2.9 (ref 2.0–3.0)

## 2017-07-03 NOTE — Patient Instructions (Signed)
Continue coumadin 1 tablet daily except 1/2 tablet on Sundays Continue greens Recheck in 6 weeks. 

## 2017-08-14 ENCOUNTER — Ambulatory Visit (INDEPENDENT_AMBULATORY_CARE_PROVIDER_SITE_OTHER): Payer: Medicare Other | Admitting: *Deleted

## 2017-08-14 DIAGNOSIS — I482 Chronic atrial fibrillation, unspecified: Secondary | ICD-10-CM

## 2017-08-14 DIAGNOSIS — Z5181 Encounter for therapeutic drug level monitoring: Secondary | ICD-10-CM | POA: Diagnosis not present

## 2017-08-14 LAB — POCT INR: INR: 2.9 (ref 2.0–3.0)

## 2017-08-14 NOTE — Patient Instructions (Addendum)
Continue coumadin 1 tablet daily except 1/2 tablet on Sundays Continue greens Recheck in 6 weeks. 

## 2017-09-19 ENCOUNTER — Ambulatory Visit: Payer: Medicare Other | Admitting: General Surgery

## 2017-09-19 ENCOUNTER — Encounter: Payer: Self-pay | Admitting: General Surgery

## 2017-09-19 VITALS — BP 147/79 | HR 83 | Temp 97.7°F | Resp 18 | Wt 227.0 lb

## 2017-09-19 DIAGNOSIS — K4090 Unilateral inguinal hernia, without obstruction or gangrene, not specified as recurrent: Secondary | ICD-10-CM

## 2017-09-19 NOTE — Progress Notes (Signed)
Tristan Lopez; [id number]; 07-23-1930   HPI Patient is an 84 year old white male who was referred to my care for evaluation and treatment of right groin swelling.  He states that when he has been standing for long time or is lifting heavy material, a bulge develops in the right lower quadrant of his abdomen.  The pain seems to radiate to the scrotum.  He states he has been evaluated previously by another surgeon who did not appreciate a hernia.  He did have an ultrasound which was unremarkable.  When the lump developed, he lies down and is able to push it back in.  He currently has 0 out of 10 pain.  Patient is on Coumadin due to atrial fibrillation. Past Medical History:  Diagnosis Date  . Abdominal aortic aneurysm without rupture ([place])   . Bilateral renal cysts   . BPH (benign prostatic hyperplasia)   . Chronic atrial fibrillation ([place])   . COPD (chronic obstructive pulmonary disease) ([place])   . Diverticulosis    Pancolonic  . Essential hypertension   . Hemorrhoids   . Hiatal hernia   . Hypercholesteremia   . Liver cyst    Benign by liver biopsy September 2013  . Nephrolithiasis   . Pre-diabetes   . Reflux esophagitis   . Sleep apnea    Uses CPAP  . Tubular adenoma     Past Surgical History:  Procedure Laterality Date  . COLONOSCOPY     2003, no polyps per patient. Dr. West Carbo  . COLONOSCOPY  11/01/11   Dr. Gala Romney- tubular adenoma,suboptimal preparation, internal hemorrhoids, o/w normal rectum. pancolonic diverticulosis  . ESOPHAGOGASTRODUODENOSCOPY  11/01/11   Dr. Gala Romney- erosive reflux esophagitis along with a patulous EG junction, hialtal hernia, antral erosions with possible area of healing ulceration- s/p bx= chronic erosive gastritis, no malignancy  . HERNIA REPAIR     x2, bilateral inguinal   . KIDNEY STONE SURGERY    . KNEE ARTHROSCOPY     X2  . NASAL ENDOSCOPY      Family History  Problem Relation Age of Onset  . Breast cancer Mother   . Heart disease Father         Pacemaker  . Colon cancer Neg Hx   . Liver disease Neg Hx     Current Outpatient Medications on File Prior to Visit  Medication Sig Dispense Refill  . amLODipine (NORVASC) 5 MG tablet TAKE ONE TABLET BY MOUTH ONCE DAILY 90 tablet 0  . benazepril (LOTENSIN) 40 MG tablet TAKE ONE TABLET BY MOUTH ONCE DAILY 90 tablet 3  . Cholecalciferol (VITAMIN D-3) 1000 units CAPS Take 2,000 Units by mouth.    . finasteride (PROSCAR) 5 MG tablet Take 5 mg by mouth daily.    . Garlic 9381 MG CAPS Take 1,000 mg by mouth daily.    Marland Kitchen oxybutynin (DITROPAN-XL) 10 MG 24 hr tablet Take 10 mg by mouth daily.    . pantoprazole (PROTONIX) 40 MG tablet Take 1 tablet (40 mg total) by mouth 2 (two) times daily. 180 tablet 1  . potassium chloride SA (K-DUR,KLOR-CON) 20 MEQ tablet TAKE 1 TABLET BY MOUTH ONCE DAILY 90 tablet 3  . predniSONE (DELTASONE) 5 MG tablet Take 7.5 mg by mouth daily.     . propranolol (INDERAL) 20 MG tablet Take 20 mg by mouth daily.     . ranitidine (ZANTAC) 150 MG tablet Take 150 mg by mouth 2 (two) times daily.    . rosuvastatin (CRESTOR) 10 MG tablet  Take 1 tablet (10 mg total) by mouth daily. 90 tablet 1  . Tamsulosin HCl (FLOMAX) 0.4 MG CAPS Take 0.4 mg by mouth daily.    Marland Kitchen warfarin (COUMADIN) 5 MG tablet Take 1 tablet daily except 1/2 tablet on Wednesdays (Patient taking differently: Take 1 tablet daily except 1/2 tablet on Sundays/Wednesdays) 100 tablet 3   No current facility-administered medications on file prior to visit.     Allergies  Allergen Reactions  . Latex Rash    Social History   Substance and Sexual Activity  Alcohol Use No    Social History   Tobacco Use  Smoking Status Former Smoker  . Packs/day: 0.50  . Types: Cigarettes  . Last attempt to quit: 04/03/1997  . Years since quitting: 20.4  Smokeless Tobacco Never Used    Review of Systems  Constitutional: Positive for malaise/fatigue.  HENT: Negative.   Eyes: Negative.   Respiratory: Negative.    Cardiovascular: Negative.   Gastrointestinal: Negative.   Genitourinary: Positive for frequency.  Musculoskeletal: Positive for back pain and joint pain.  Skin: Negative.   Neurological: Negative.   Endo/Heme/Allergies: Bruises/bleeds easily.  Psychiatric/Behavioral: Negative.     Objective   Vitals:   09/19/17 0849  BP: (!) 147/79  Pulse: 83  Resp: 18  Temp: 97.7 F (36.5 C)    Physical Exam  Constitutional: He is oriented to person, place, and time. He appears well-developed and well-nourished. No distress.  HENT:  Head: Normocephalic and atraumatic.  Cardiovascular: Exam reveals no friction rub.  No murmur heard. Irregularly irregular rate and rhythm.  Pulmonary/Chest: Effort normal and breath sounds normal. No stridor. No respiratory distress. He has no wheezes. He has no rales.  Abdominal: Soft. Bowel sounds are normal. He exhibits no distension. There is no tenderness. There is no rebound and no guarding. A hernia is present.  I do appreciate laxity and bulging in the right inguinal region.  It is reducible.  Suspect a direct hernia.  Neurological: He is alert and oriented to person, place, and time.  Skin: Skin is warm and dry.  Vitals reviewed.  Dr. Lanice Shirts notes reviewed. Assessment  Right inguinal hernia, chronic anticoagulation Plan   As the patient is not too symptomatic, I would like to delay any surgical intervention until he is more symptomatic.  He is at increased risk for surgery and postoperative complications due to his multiple medical problems and chronic anticoagulation.  He understands this and agrees.  This was also discussed with his son who was present.  They will return should his symptoms worsen.  He was instructed to avoid any significant heavy lifting.  Follow-up as needed.

## 2017-09-19 NOTE — Patient Instructions (Signed)

## 2017-09-25 ENCOUNTER — Ambulatory Visit (INDEPENDENT_AMBULATORY_CARE_PROVIDER_SITE_OTHER): Payer: Medicare Other | Admitting: *Deleted

## 2017-09-25 DIAGNOSIS — Z5181 Encounter for therapeutic drug level monitoring: Secondary | ICD-10-CM

## 2017-09-25 DIAGNOSIS — I482 Chronic atrial fibrillation, unspecified: Secondary | ICD-10-CM

## 2017-09-25 LAB — POCT INR: INR: 2.2 (ref 2.0–3.0)

## 2017-09-25 NOTE — Patient Instructions (Signed)
Continue coumadin 1 tablet daily except 1/2 tablet on Sundays Continue greens Recheck in 6 weeks. 

## 2017-10-15 ENCOUNTER — Telehealth: Payer: Self-pay | Admitting: Cardiology

## 2017-10-15 NOTE — Telephone Encounter (Signed)
Patient calling in regards to BP readings / tg

## 2017-10-16 NOTE — Telephone Encounter (Signed)
1435 hrs: Attempt to reach, [id number]

## 2017-10-17 NOTE — Telephone Encounter (Signed)
I reviewed the scanned report.  Generally we would be aiming for systolic blood pressures less than 150.  I plan to have nursing contact you to review full list of current medications so I can be better able to make recommendations regarding adjustments.  Our current medication list still indicates Norvasc, Lotensin, and Inderal.

## 2017-10-21 ENCOUNTER — Telehealth: Payer: Self-pay

## 2017-10-21 NOTE — Telephone Encounter (Signed)
9/30 LM for son to call me-[place]arl[place] RN    October 17, 2017  Satira Sark, MD  to Me        8:57 AM  Please contact patient's son to review the full medication list. It looks like things may have changed since I last saw him in January.  Satira Sark, MD       8:53 AM  Note    I reviewed the scanned report.  Generally we would be aiming for systolic blood pressures less than 150.  I plan to have nursing contact you to review full list of current medications so I can be better able to make recommendations regarding adjustments.  Our current medication list still indicates Norvasc, Lotensin, and Inderal.

## 2017-10-25 NOTE — Telephone Encounter (Signed)
Called pt. No answer. Left message for pt to return call.  

## 2017-11-06 ENCOUNTER — Ambulatory Visit (INDEPENDENT_AMBULATORY_CARE_PROVIDER_SITE_OTHER): Payer: Medicare Other | Admitting: *Deleted

## 2017-11-06 DIAGNOSIS — Z5181 Encounter for therapeutic drug level monitoring: Secondary | ICD-10-CM

## 2017-11-06 DIAGNOSIS — I482 Chronic atrial fibrillation, unspecified: Secondary | ICD-10-CM | POA: Diagnosis not present

## 2017-11-06 LAB — POCT INR: INR: 2.7 (ref 2.0–3.0)

## 2017-11-06 NOTE — Patient Instructions (Signed)
Continue coumadin 1 tablet daily except 1/2 tablet on Sundays Continue greens Recheck in 6 weeks. 

## 2017-12-02 ENCOUNTER — Encounter: Payer: Self-pay | Admitting: Internal Medicine

## 2017-12-18 ENCOUNTER — Ambulatory Visit (INDEPENDENT_AMBULATORY_CARE_PROVIDER_SITE_OTHER): Payer: Medicare Other | Admitting: *Deleted

## 2017-12-18 DIAGNOSIS — Z5181 Encounter for therapeutic drug level monitoring: Secondary | ICD-10-CM

## 2017-12-18 DIAGNOSIS — I482 Chronic atrial fibrillation, unspecified: Secondary | ICD-10-CM | POA: Diagnosis not present

## 2017-12-18 LAB — POCT INR: INR: 2.3 (ref 2.0–3.0)

## 2017-12-18 NOTE — Patient Instructions (Signed)
Continue coumadin 1 tablet daily except 1/2 tablet on Sundays Continue greens Recheck in 6 weeks.

## 2018-01-11 ENCOUNTER — Encounter (HOSPITAL_COMMUNITY): Payer: Self-pay | Admitting: Emergency Medicine

## 2018-01-11 ENCOUNTER — Emergency Department (HOSPITAL_COMMUNITY)
Admission: EM | Admit: 2018-01-11 | Discharge: 2018-01-11 | Disposition: A | Discharge status: Home / Self Care | Payer: Medicare Other | Attending: Emergency Medicine | Admitting: Emergency Medicine

## 2018-01-11 DIAGNOSIS — Z91040 Latex allergy status: Secondary | ICD-10-CM | POA: Insufficient documentation

## 2018-01-11 DIAGNOSIS — I10 Essential (primary) hypertension: Secondary | ICD-10-CM | POA: Insufficient documentation

## 2018-01-11 DIAGNOSIS — Z87891 Personal history of nicotine dependence: Secondary | ICD-10-CM | POA: Diagnosis not present

## 2018-01-11 DIAGNOSIS — J449 Chronic obstructive pulmonary disease, unspecified: Secondary | ICD-10-CM | POA: Diagnosis not present

## 2018-01-11 DIAGNOSIS — Z7901 Long term (current) use of anticoagulants: Secondary | ICD-10-CM | POA: Diagnosis not present

## 2018-01-11 DIAGNOSIS — Z79899 Other long term (current) drug therapy: Secondary | ICD-10-CM | POA: Insufficient documentation

## 2018-01-11 LAB — I-STAT CHEM 8, ED
BUN: 20 mg/dL (ref 8–23)
CHLORIDE: 101 mmol/L (ref 98–111)
Calcium, Ion: 1.19 mmol/L (ref 1.15–1.40)
Creatinine, Ser: 0.7 mg/dL (ref 0.61–1.24)
Glucose, Bld: 99 mg/dL (ref 70–99)
HEMATOCRIT: 39 % (ref 39.0–52.0)
Hemoglobin: 13.3 g/dL (ref 13.0–17.0)
Potassium: 3.6 mmol/L (ref 3.5–5.1)
SODIUM: 139 mmol/L (ref 135–145)
TCO2: 27 mmol/L (ref 22–32)

## 2018-01-11 NOTE — ED Triage Notes (Signed)
Pt reports he has not been checking his bp daily because it was ok.  Checked it the past few days and has been elevated.  Denies any symptoms or complaints.  Was 181/110.

## 2018-01-11 NOTE — ED Provider Notes (Signed)
[facility] EMERGENCY DEPARTMENT Provider Note   CSN: [id number] Arrival date & time: 01/11/18  9295     History   Chief Complaint Chief Complaint  Patient presents with  . Hypertension    HPI Tristan Lopez is a 84 y.o. male with a history of chronic A. fib on Coumadin, COPD, hypertension, sleep apnea and a stable AAA presenting with a several day history of elevated blood pressures.  He is on several daily medications for his blood pressure and denies missing any doses.  Approximately 1 month ago his blood pressure medications were reduced by his PCP, but his blood pressures started to creep up again, so he was placed back on his morning Lotensin 40 mg, evening Norvasc 5 mg in midday propranolol 20 mg tablets.  He denies missing any doses.  He has been checking his blood pressures for the past several days and has been getting various elevated blood pressure readings, the greatest being 188/83 yesterday.  He denies any chest pain, shortness of breath, headache, vision changes orthopnea or peripheral edema.  The history is provided by the patient, a relative and the spouse.    Past Medical History:  Diagnosis Date  . Abdominal aortic aneurysm without rupture ([place])   . Bilateral renal cysts   . BPH (benign prostatic hyperplasia)   . Chronic atrial fibrillation   . COPD (chronic obstructive pulmonary disease) ([place])   . Diverticulosis    Pancolonic  . Essential hypertension   . Hemorrhoids   . Hiatal hernia   . Hypercholesteremia   . Liver cyst    Benign by liver biopsy September 2013  . Nephrolithiasis   . Pre-diabetes   . Reflux esophagitis   . Sleep apnea    Uses CPAP  . Tubular adenoma     Patient Active Problem List   Diagnosis Date Noted  . Hx of adenomatous colonic polyps 01/17/2017  . Hoarseness 01/17/2017  . Hyperlipidemia 11/19/2013  . AAA (abdominal aortic aneurysm) without rupture ([place]) 02/17/2013  . Encounter for therapeutic drug monitoring 02/12/2013    . Essential hypertension 05/12/2012  . Long term (current) use of anticoagulants 03/27/2012  . Chronic atrial fibrillation 03/13/2012  . GERD (gastroesophageal reflux disease) 10/08/2011  . Constipation 10/08/2011  . High risk medication use 10/08/2011    Past Surgical History:  Procedure Laterality Date  . COLONOSCOPY     2003, no polyps per patient. Dr. West Carbo  . COLONOSCOPY  11/01/11   Dr. Gala Romney- tubular adenoma,suboptimal preparation, internal hemorrhoids, o/w normal rectum. pancolonic diverticulosis  . ESOPHAGOGASTRODUODENOSCOPY  11/01/11   Dr. Gala Romney- erosive reflux esophagitis along with a patulous EG junction, hialtal hernia, antral erosions with possible area of healing ulceration- s/p bx= chronic erosive gastritis, no malignancy  . HERNIA REPAIR     x2, bilateral inguinal   . KIDNEY STONE SURGERY    . KNEE ARTHROSCOPY     X2  . NASAL ENDOSCOPY          Home Medications    Prior to Admission medications   Medication Sig Start Date End Date Taking? Authorizing Provider  amLODipine (NORVASC) 5 MG tablet TAKE ONE TABLET BY MOUTH ONCE DAILY 11/09/13   Lendon Colonel, NP  benazepril (LOTENSIN) 40 MG tablet TAKE ONE TABLET BY MOUTH ONCE DAILY 01/04/14   Satira Sark, MD  Cholecalciferol (VITAMIN D-3) 1000 units CAPS Take 2,000 Units by mouth.    [provider]  finasteride (PROSCAR) 5 MG tablet Take 5 mg by  mouth daily.    [provider]  Garlic 2956 MG CAPS Take 1,000 mg by mouth daily.    [provider]  oxybutynin (DITROPAN-XL) 10 MG 24 hr tablet Take 10 mg by mouth daily.    [provider]  pantoprazole (PROTONIX) 40 MG tablet Take 1 tablet (40 mg total) by mouth 2 (two) times daily. 08/24/13   Annitta Needs, NP  potassium chloride SA (K-DUR,KLOR-CON) 20 MEQ tablet TAKE 1 TABLET BY MOUTH ONCE DAILY 06/10/17   Satira Sark, MD  predniSONE (DELTASONE) 5 MG tablet Take 7.5 mg by mouth daily.     [provider]  propranolol (INDERAL) 20 MG tablet Take 20 mg by mouth daily.     [provider]  ranitidine (ZANTAC) 150 MG tablet Take 150 mg by mouth 2 (two) times daily.    [provider]  rosuvastatin (CRESTOR) 10 MG tablet Take 1 tablet (10 mg total) by mouth daily. 02/17/13   Lendon Colonel, NP  Tamsulosin HCl (FLOMAX) 0.4 MG CAPS Take 0.4 mg by mouth daily.    [provider]  warfarin (COUMADIN) 5 MG tablet Take 1 tablet daily except 1/2 tablet on Wednesdays Patient taking differently: Take 1 tablet daily except 1/2 tablet on Sundays/Wednesdays 05/16/16   Josue Hector, MD    Family History Family History  Problem Relation Age of Onset  . Breast cancer Mother   . Heart disease Father        Pacemaker  . Colon cancer Neg Hx   . Liver disease Neg Hx     Social History Social History   Tobacco Use  . Smoking status: Former Smoker    Packs/day: 0.50    Types: Cigarettes    Last attempt to quit: 04/03/1997    Years since quitting: 20.7  . Smokeless tobacco: Never Used  Substance Use Topics  . Alcohol use: No  . Drug use: No     Allergies   Latex   Review of Systems Review of Systems  Constitutional: Negative for chills and fever.  HENT: Negative for congestion and sore throat.   Eyes: Negative for visual disturbance.  Respiratory: Negative for chest tightness and shortness of breath.   Cardiovascular: Negative for chest pain and leg swelling.  Gastrointestinal: Negative for abdominal pain, nausea and vomiting.  Genitourinary: Negative.   Musculoskeletal: Negative for arthralgias, joint swelling and neck pain.  Skin: Negative.  Negative for rash and wound.  Neurological: Negative for dizziness, weakness, numbness and headaches.  Psychiatric/Behavioral: Negative.      Physical Exam Updated Vital Signs BP (!) 155/73 (BP Location: Left Arm)   Pulse (!) 56   Temp 98.1 F (36.7 C)   Resp 14   Ht 5\' 8"  (1.727 m)   Wt 99.8 kg   SpO2  100%   BMI 33.45 kg/m   Physical Exam Vitals signs and nursing note reviewed.  Constitutional:      Appearance: He is well-developed.  HENT:     Head: Normocephalic and atraumatic.  Eyes:     Conjunctiva/sclera: Conjunctivae normal.  Neck:     Musculoskeletal: Normal range of motion.  Cardiovascular:     Rate and Rhythm: Normal rate. Rhythm irregularly irregular.     Pulses: Normal pulses.     Heart sounds: Normal heart sounds. No murmur.  Pulmonary:     Effort: Pulmonary effort is normal.     Breath sounds: Normal breath sounds. No wheezing.  Abdominal:  General: Bowel sounds are normal.     Palpations: Abdomen is soft.     Tenderness: There is no abdominal tenderness.  Musculoskeletal: Normal range of motion.     Right lower leg: No edema.     Left lower leg: No edema.  Skin:    General: Skin is warm and dry.  Neurological:     Mental Status: He is alert.      ED Treatments / Results  Labs (all labs ordered are listed, but only abnormal results are displayed) Labs Reviewed  I-STAT CHEM 8, ED    EKG EKG Interpretation  Date/Time:  Saturday January 11 2018 10:34:53 EST Ventricular Rate:  50 PR Interval:    QRS Duration: 91 QT Interval:  448 QTC Calculation: 409 R Axis:   45 Text Interpretation:  Atrial fibrillation Low voltage, precordial leads no acute ST/T changes rate is slower but otherwise similar to Mar 2017 Confirmed by Sherwood Gambler [phone]) on 01/11/2018 10:37:29 AM   Radiology No results found.  Procedures Procedures (including critical care time)  Medications Ordered in ED Medications - No data to display   Initial Impression / Assessment and Plan / ED Course  I have reviewed the triage vital signs and the nursing notes.  Pertinent labs & imaging results that were available during my care of the patient were reviewed by me and considered in my medical decision making (see chart for details).     Pt with asymptomatic HTN, current  bp 155/73.  He is on multiple bp meds. With no missed doses.  He was given option of increasing bp med vs discussion with pcp on Monday.  Pt opts to discuss with pcp on Monday.  Prn f/u anticipated.  Return precautions discussed.  Pt was seen by Dr Regenia Skeeter prior to dc home.   Final Clinical Impressions(s) / ED Diagnoses   Final diagnoses:  Essential hypertension    ED Discharge Orders    None       Landis Martins 01/11/18 1107    Sherwood Gambler, MD 01/11/18 [phone]

## 2018-01-29 ENCOUNTER — Ambulatory Visit (INDEPENDENT_AMBULATORY_CARE_PROVIDER_SITE_OTHER): Payer: Medicare Other | Admitting: Pharmacist

## 2018-01-29 DIAGNOSIS — I482 Chronic atrial fibrillation, unspecified: Secondary | ICD-10-CM

## 2018-01-29 DIAGNOSIS — Z5181 Encounter for therapeutic drug level monitoring: Secondary | ICD-10-CM

## 2018-01-29 LAB — POCT INR: INR: 1.7 — AB (ref 2.0–3.0)

## 2018-01-29 NOTE — Patient Instructions (Signed)
Description   Take 1.5 tablets today then continue coumadin 1 tablet daily except 1/2 tablet on Sundays Continue greens Recheck in 4 weeks.

## 2018-01-30 ENCOUNTER — Telehealth: Payer: Self-pay | Admitting: Pharmacist

## 2018-01-30 NOTE — Telephone Encounter (Signed)
Satira Sark, MD  Ailene Ards, RN Cc: Erskine Emery, [facility]        Thank you for this update. He actually had his INR checked yesterday at 1.7 in our anticoagulation clinic. I will forward this information to our Pharm.D.   Previous Messages    ----- Message -----  From: Ailene Ards, RN  Sent: 01/30/2018  1:40 PM EST  To: Satira Sark, MD  Subject: Coumadin Reaction to New Medications       Hi Dr. Domenic Polite!   I recently saw the above patient at his PCP office (Dr. Anastasio Champion), and he tested positive for H. Pylori, so we are initiating him on a 2 week, 4-drug regimen. The patient's son believes that you are the doctor that prescribes the patient's coudmadin and monitors the patient's INR.   Thus, I wanted to let you know that two of the medications are pepto-bismol and flagyl. I believe these may interact with coumadin and result in increased levels of coumadin or at least increase the patient's bleeding risk. I wasn't sure how often he is having his INR monitored, but I wanted to let you know about these medication additions in case we need to change his dosage of coumadin or if he needs to have INRs monitored more frequently over the next couple of weeks.   Let me know if there is anything else I can do to be of assistance!   Sincerely,  Jeralyn Ruths, DNP, NP-C       01/30/18 - called pt to follow up. Spoke with patient  Who states that he was not started on any new medications yesterday with visit, he read off the list provided to him from the doctor's office and his prescription bottles. He states that potentially son may be aware of something he is not and will call back if that is the case. He is aware to call if he does start any new medications as will need his INR checked.

## 2018-02-03 ENCOUNTER — Telehealth: Payer: Self-pay | Admitting: Cardiology

## 2018-02-03 NOTE — Telephone Encounter (Signed)
Patient's son has conerns about patient being started on Flagyl and Tetracycline and how it will affect Coumadin. / tg

## 2018-02-03 NOTE — Telephone Encounter (Signed)
Spoke with pt's son. He started new medications flagyl and tetracycline today. Advised that he eat plenty of greens and come to have INR checked on 02/10/18 (the next day Coumadin clinic in [place]) he is unable to come to [place] office to be checked. Pt will eat turnips until appt that day.

## 2018-02-10 ENCOUNTER — Ambulatory Visit (INDEPENDENT_AMBULATORY_CARE_PROVIDER_SITE_OTHER): Payer: Medicare Other | Admitting: Pharmacist

## 2018-02-10 DIAGNOSIS — I482 Chronic atrial fibrillation, unspecified: Secondary | ICD-10-CM

## 2018-02-10 DIAGNOSIS — Z5181 Encounter for therapeutic drug level monitoring: Secondary | ICD-10-CM

## 2018-02-10 LAB — POCT INR: INR: 4.1 — AB (ref 2.0–3.0)

## 2018-02-10 NOTE — Patient Instructions (Signed)
Description   No warfarin today then take 1 tablet daily, except Thursday and Sunday while on antibiotic.  Then continue coumadin 1 tablet daily except 1/2 tablet on Sundays once antibiotics finished Continue greens Recheck with upcoming doctors appt.

## 2018-02-11 ENCOUNTER — Ambulatory Visit: Payer: Medicare Other | Admitting: Internal Medicine

## 2018-02-18 ENCOUNTER — Ambulatory Visit: Payer: Medicare Other | Admitting: Internal Medicine

## 2018-02-18 VITALS — BP 136/72 | HR 68 | Temp 97.0°F | Ht 68.0 in | Wt 223.4 lb

## 2018-02-18 DIAGNOSIS — K219 Gastro-esophageal reflux disease without esophagitis: Secondary | ICD-10-CM

## 2018-02-18 DIAGNOSIS — R194 Change in bowel habit: Secondary | ICD-10-CM

## 2018-02-18 NOTE — Patient Instructions (Signed)
Continue pantoprazole 40 mg twice daiy  May stop ranitidine  Begin Benefiber 1 tablespoon daily x 3 weeks; then increase to twice daily thereafter  Office visit here in 6 months  If groin hernia worsens, follow-up with Dr. Arnoldo Morale

## 2018-02-18 NOTE — Progress Notes (Signed)
Primary Care Physician:  [name], MD Primary Gastroenterologist:  Dr. Gala Romney  Pre-Procedure History & Physical: HPI:  Tristan Lopez is a 85 y.o. male here for follow-up of GERD.  Has history of right inguinal hernia.  Followed conservatively by Dr. Arnoldo Morale at this time.  Occasionally it bothers him.  Reflux symptoms well controlled.  Patient tells me is treated for H. pylori recently by Dr. Anastasio Champion due to bad breath.  Regimen includes Protonix 40 mg twice daily; ranitidine 150 mg nightly added by Dr. Anastasio Champion for "mucus buildup in my throat closes ".  History of a hoarseness vocal cord dysfunction.  By report, not felt to be related to reflux per ENT.  Times has bowel movement leakage with urinating.  Denies diarrhea rectal bleeding or melena.  Past Medical History:  Diagnosis Date  . Abdominal aortic aneurysm without rupture ([place])   . Bilateral renal cysts   . BPH (benign prostatic hyperplasia)   . Chronic atrial fibrillation   . COPD (chronic obstructive pulmonary disease) ([place])   . Diverticulosis    Pancolonic  . Essential hypertension   . Hemorrhoids   . Hiatal hernia   . Hypercholesteremia   . Liver cyst    Benign by liver biopsy September 2013  . Nephrolithiasis   . Pre-diabetes   . Reflux esophagitis   . Sleep apnea    Uses CPAP  . Tubular adenoma     Past Surgical History:  Procedure Laterality Date  . COLONOSCOPY     2003, no polyps per patient. Dr. West Carbo  . COLONOSCOPY  11/01/11   Dr. Gala Romney- tubular adenoma,suboptimal preparation, internal hemorrhoids, o/w normal rectum. pancolonic diverticulosis  . ESOPHAGOGASTRODUODENOSCOPY  11/01/11   Dr. Gala Romney- erosive reflux esophagitis along with a patulous EG junction, hialtal hernia, antral erosions with possible area of healing ulceration- s/p bx= chronic erosive gastritis, no malignancy  . HERNIA REPAIR     x2, bilateral inguinal   . KIDNEY STONE SURGERY    . KNEE ARTHROSCOPY     X2  . NASAL  ENDOSCOPY      Prior to Admission medications   Medication Sig Start Date End Date Taking? Authorizing Provider  amLODipine (NORVASC) 10 MG tablet Take 10 mg by mouth daily. 02/11/18  Yes [provider]  benazepril (LOTENSIN) 40 MG tablet TAKE ONE TABLET BY MOUTH ONCE DAILY 01/04/14  Yes Satira Sark, MD  Cholecalciferol (VITAMIN D-3) 1000 units CAPS Take 2,000 Units by mouth.   Yes [provider]  Garlic 3329 MG CAPS Take 1,000 mg by mouth daily.   Yes [provider]  oxybutynin (DITROPAN-XL) 10 MG 24 hr tablet Take 10 mg by mouth daily.   Yes [provider]  pantoprazole (PROTONIX) 40 MG tablet Take 1 tablet (40 mg total) by mouth 2 (two) times daily. 08/24/13  Yes Annitta Needs, NP  potassium chloride SA (K-DUR,KLOR-CON) 20 MEQ tablet TAKE 1 TABLET BY MOUTH ONCE DAILY 06/10/17  Yes Satira Sark, MD  Probiotic Product (DIGESTIVE ADVANTAGE PO) Take by mouth 2 (two) times daily.    Yes [provider]  propranolol (INDERAL) 20 MG tablet Take 20 mg by mouth daily.    Yes [provider]  ranitidine (ZANTAC) 150 MG tablet Take 150 mg by mouth at bedtime.    Yes [provider]  rosuvastatin (CRESTOR) 10 MG tablet Take 1 tablet (10 mg total) by mouth daily. 02/17/13  Yes Lendon Colonel, NP  tadalafil (  CIALIS) 5 MG tablet Take 5 mg by mouth daily.   Yes [provider]  warfarin (COUMADIN) 5 MG tablet Take 1 tablet daily except 1/2 tablet on Wednesdays Patient taking differently: Take 1 tablet daily except 1/2 tablet on Sunday 05/16/16  Yes Josue Hector, MD  amLODipine (NORVASC) 5 MG tablet TAKE ONE TABLET BY MOUTH ONCE DAILY Patient not taking: Reported on 02/18/2018 11/09/13   Lendon Colonel, NP  finasteride (PROSCAR) 5 MG tablet Take 5 mg by mouth daily.    [provider]  predniSONE (DELTASONE) 5 MG tablet Take 7.5 mg by mouth daily.     [provider]  Tamsulosin HCl (FLOMAX) 0.4  MG CAPS Take 0.4 mg by mouth daily.    [provider]    Allergies as of 02/18/2018 - Review Complete 02/18/2018  Allergen Reaction Noted  . Latex Rash 10/04/2011    Family History  Problem Relation Age of Onset  . Breast cancer Mother   . Heart disease Father        Pacemaker  . Colon cancer Neg Hx   . Liver disease Neg Hx     Social History   Socioeconomic History  . Marital status: Married    Spouse name: Not on file  . Number of children: 3  . Years of education: Not on file  . Highest education level: Not on file  Occupational History    Employer: RETIRED  Social Needs  . Financial resource strain: Not on file  . Food insecurity:    Worry: Not on file    Inability: Not on file  . Transportation needs:    Medical: Not on file    Non-medical: Not on file  Tobacco Use  . Smoking status: Former Smoker    Packs/day: 0.50    Types: Cigarettes    Last attempt to quit: 04/03/1997    Years since quitting: 20.8  . Smokeless tobacco: Never Used  Substance and Sexual Activity  . Alcohol use: No  . Drug use: No  . Sexual activity: Not on file  Lifestyle  . Physical activity:    Days per week: Not on file    Minutes per session: Not on file  . Stress: Not on file  Relationships  . Social connections:    Talks on phone: Not on file    Gets together: Not on file    Attends religious service: Not on file    Active member of club or organization: Not on file    Attends meetings of clubs or organizations: Not on file    Relationship status: Not on file  . Intimate partner violence:    Fear of current or ex partner: Not on file    Emotionally abused: Not on file    Physically abused: Not on file    Forced sexual activity: Not on file  Other Topics Concern  . Not on file  Social History Narrative  . Not on file    Review of Systems: See HPI, otherwise negative ROS  Physical Exam: BP 136/72   Pulse 68   Temp (!) 97 F (36.1 C) (Oral)   Ht 5\' 8"   (1.727 m)   Wt 223 lb 6.4 oz (101.3 kg)   BMI 33.97 kg/m  General:   Alert, somewhat hard of hearing.  Accompanied by his son.  Appears no acute distress. Neck:  Supple; no masses or thyromegaly. No significant cervical adenopathy. Lungs:  Clear throughout to auscultation.  No wheezes, crackles, or rhonchi. No acute distress. Heart:  Regular rate and rhythm; no murmurs, clicks, rubs,  or gallops. Abdomen: Non-distended, normal bowel sounds.  Soft and nontender without appreciable mass or hepatosplenomegaly.  Pulses:  Normal pulses noted. Extremities:  Without clubbing or edema.  Impression/Plan: All in all, GERD symptoms well controlled on Protonix 40 mg twice daily.  Ranitidine at bedtime likely not adding anything to reflux regimen.  Also, likely not going to alter his complaint of mucus.  More likely, his nocturnal symptoms may be related to ENT origin. Engine bowel habits.  Known p.m. can colonic diverticulosis.  He is not taking any fiber.  May benefit from bulking up his stool with a fiber supplement.  Recommendations:  Continue pantoprazole 40 mg twice daiy  May stop ranitidine  Begin Benefiber 1 tablespoon daily x 3 weeks; then increase to twice daily thereafter  Office visit here in 6 months  If groin hernia worsens, follow-up with Dr. Arnoldo Morale   Notice: This dictation was prepared with Dragon dictation along with smaller phrase technology. Any transcriptional errors that result from this process are unintentional and may not be corrected upon review.

## 2018-03-04 NOTE — Progress Notes (Signed)
Cardiology Office Note  Date: 03/05/2018   ID: Tristan Lopez, DOB 04-15-1930, MRN [id number]  PCP: [name], MD  Primary Cardiologist: [name], MD   Chief Complaint  Patient presents with  . Atrial Fibrillation    History of Present Illness: Tristan Lopez is an 85 y.o. male last seen in January 2019.  He is here for a routine visit with his daughter.  He does not voice any concerns about palpitations or chest pain.  They do mention that he was having some difficulty with elevation in blood pressure resulting in medication adjustments as outlined below.  His blood pressure control is reasonable today.  He is on Coumadin with follow-up in the anticoagulation clinic.  INR 1.9 today.  He has had no bleeding problems.  We discussed obtaining a follow-up abdominal ultrasound for surveillance of AAA.  Past Medical History:  Diagnosis Date  . Abdominal aortic aneurysm without rupture ([place])   . Bilateral renal cysts   . BPH (benign prostatic hyperplasia)   . Chronic atrial fibrillation   . COPD (chronic obstructive pulmonary disease) ([place])   . Diverticulosis    Pancolonic  . Essential hypertension   . Hemorrhoids   . Hiatal hernia   . Hypercholesteremia   . Liver cyst    Benign by liver biopsy September 2013  . Nephrolithiasis   . Pre-diabetes   . Reflux esophagitis   . Sleep apnea    Uses CPAP  . Tubular adenoma     Past Surgical History:  Procedure Laterality Date  . COLONOSCOPY     2003, no polyps per patient. Dr. West Carbo  . COLONOSCOPY  11/01/11   Dr. Gala Romney- tubular adenoma,suboptimal preparation, internal hemorrhoids, o/w normal rectum. pancolonic diverticulosis  . ESOPHAGOGASTRODUODENOSCOPY  11/01/11   Dr. Gala Romney- erosive reflux esophagitis along with a patulous EG junction, hialtal hernia, antral erosions with possible area of healing ulceration- s/p bx= chronic erosive gastritis, no malignancy  . HERNIA REPAIR     x2, bilateral inguinal    . KIDNEY STONE SURGERY    . KNEE ARTHROSCOPY     X2  . NASAL ENDOSCOPY      Current Outpatient Medications  Medication Sig Dispense Refill  . amLODipine (NORVASC) 10 MG tablet Take 10 mg by mouth daily.    . benazepril (LOTENSIN) 40 MG tablet TAKE ONE TABLET BY MOUTH ONCE DAILY 90 tablet 3  . Cholecalciferol (VITAMIN D-3) 1000 units CAPS Take 2,000 Units by mouth.    . Garlic 3244 MG CAPS Take 1,000 mg by mouth daily.    Marland Kitchen oxybutynin (DITROPAN-XL) 10 MG 24 hr tablet Take 10 mg by mouth daily.    . pantoprazole (PROTONIX) 40 MG tablet Take 1 tablet (40 mg total) by mouth 2 (two) times daily. 180 tablet 1  . potassium chloride SA (K-DUR,KLOR-CON) 20 MEQ tablet TAKE 1 TABLET BY MOUTH ONCE DAILY 90 tablet 3  . Probiotic Product (DIGESTIVE ADVANTAGE PO) Take by mouth 2 (two) times daily.     . propranolol (INDERAL) 20 MG tablet Take 20 mg by mouth daily.     . rosuvastatin (CRESTOR) 10 MG tablet Take 1 tablet (10 mg total) by mouth daily. 90 tablet 1  . tadalafil (CIALIS) 5 MG tablet Take 5 mg by mouth daily.    Marland Kitchen warfarin (COUMADIN) 5 MG tablet Take 1 tablet daily except 1/2 tablet on Wednesdays (Patient taking differently: Take 1 tablet daily except 1/2 tablet on Sunday) 100 tablet 3  .  Wheat Dextrin (BENEFIBER DRINK MIX PO) Take by mouth.     No current facility-administered medications for this visit.    Allergies:  Latex   Social History: The patient  reports that he quit smoking about 20 years ago. His smoking use included cigarettes. He smoked 0.50 packs per day. He has never used smokeless tobacco. He reports that he does not drink alcohol or use drugs.   ROS:  Please see the history of present illness. Otherwise, complete review of systems is positive for hearing loss.  All other systems are reviewed and negative.   Physical Exam: VS:  BP 138/70   Pulse (!) 53   Ht 5\' 8"  (1.727 m)   Wt 220 lb (99.8 kg)   SpO2 97%   BMI 33.45 kg/m , BMI Body mass index is 33.45 kg/m.  Wt  Readings from Last 3 Encounters:  03/05/18 220 lb (99.8 kg)  02/18/18 223 lb 6.4 oz (101.3 kg)  01/11/18 220 lb (99.8 kg)    General: Elderly male, appears comfortable at rest. HEENT: Conjunctiva and lids normal, oropharynx clear. Neck: Supple, no elevated JVP or carotid bruits, no thyromegaly. Lungs: Clear to auscultation, nonlabored breathing at rest. Cardiac: Irregularly irregular, no S3, soft systolic murmur. Abdomen: Soft, nontender, bowel sounds present. Extremities: Trace ankle edema, distal pulses 2+. Skin: Warm and dry. Musculoskeletal: No kyphosis. Neuropsychiatric: Alert and oriented x3, affect grossly appropriate.  ECG: I personally reviewed the tracing from 01/11/2018 which showed rate controlled atrial fibrillation with low voltage and nonspecific T wave changes.  Recent Labwork: 01/11/2018: BUN 20; Creatinine, Ser 0.70; Hemoglobin 13.3; Potassium 3.6; Sodium 139     Component Value Date/Time   CHOL 147 02/17/2013 0820   TRIG 86 02/17/2013 0820   HDL 50 02/17/2013 0820   CHOLHDL 2.9 02/17/2013 0820   VLDL 17 02/17/2013 0820   [place] 80 02/17/2013 0820    Other Studies Reviewed Today:  Echocardiogram 09/16/2014(Dr. Gosrani office): LVH with preserved LVEF66%, ungraded diastolic dysfunction, no significant valvular abnormalities.  Abdominal ultrasound 09/04/2016: IMPRESSION: Abdominal aortic aneurysm minimally changed from prior exam with maximal transverse dimension of 3.5 x 3.5 cm versus 06/16/2014 exam when this measured 3.3 x 3.5 cm. Recommend followup by ultrasound in 2 years. This recommendation follows ACR consensus guidelines: White Paper of the ACR Incidental Findings Committee II on Vascular Findings. J Am Coll Radiol 2013; 10:789-794.  Carotid Dopplers 04/03/2017: Final Interpretation: Right Carotid: Velocities in the right ICA are consistent with a 60-79%                stenosis. The ECA appears >50% stenosed.  Left Carotid: Velocities in  the left ICA are consistent with a 40-59% stenosis.  Vertebrals:  Bilateral vertebral arteries demonstrate antegrade flow. Subclavians: Normal flow hemodynamics were seen in bilateral subclavian              arteries.  Assessment and Plan:  1.  Permanent atrial fibrillation.  Continue Coumadin for stroke prophylaxis with follow-up in the anticoagulation clinic.  Resting heart rate is in the 50s, he is asymptomatic and concurrently on Inderal.  No syncope.  2.  Abdominal aortic aneurysm measuring 3.5 x 3.5 cm by ultrasound in 2018.  We will obtain a follow-up study prior to his next visit.  He is asymptomatic.  3.  Essential hypertension, blood pressure control is adequate today.  He continues to follow with Dr. Anastasio Champion, medication adjustments noted.  4.  Mixed hyperlipidemia on Crestor.  Keep follow-up with Dr.  Gosrani.  Current medicines were reviewed with the patient today.   Orders Placed This Encounter  Procedures  . US AORTA LIMITED    Disposition: Follow-up in 1 year, sooner if needed.  Signed, Satira Sark, MD, [facility] 03/05/2018 10:06 AM    [facility]. [address], [place], [place] [address] Phone: [phone]; Fax: [phone]

## 2018-03-05 ENCOUNTER — Ambulatory Visit: Payer: Medicare Other | Admitting: Cardiology

## 2018-03-05 ENCOUNTER — Ambulatory Visit (INDEPENDENT_AMBULATORY_CARE_PROVIDER_SITE_OTHER): Payer: Medicare Other | Admitting: Pharmacist

## 2018-03-05 ENCOUNTER — Encounter: Payer: Self-pay | Admitting: Cardiology

## 2018-03-05 VITALS — BP 138/70 | HR 53 | Ht 68.0 in | Wt 220.0 lb

## 2018-03-05 DIAGNOSIS — E782 Mixed hyperlipidemia: Secondary | ICD-10-CM

## 2018-03-05 DIAGNOSIS — I482 Chronic atrial fibrillation, unspecified: Secondary | ICD-10-CM

## 2018-03-05 DIAGNOSIS — Z8679 Personal history of other diseases of the circulatory system: Secondary | ICD-10-CM | POA: Diagnosis not present

## 2018-03-05 DIAGNOSIS — Z5181 Encounter for therapeutic drug level monitoring: Secondary | ICD-10-CM | POA: Diagnosis not present

## 2018-03-05 DIAGNOSIS — I4821 Permanent atrial fibrillation: Secondary | ICD-10-CM | POA: Diagnosis not present

## 2018-03-05 DIAGNOSIS — I10 Essential (primary) hypertension: Secondary | ICD-10-CM | POA: Diagnosis not present

## 2018-03-05 LAB — POCT INR: INR: 1.9 — AB (ref 2.0–3.0)

## 2018-03-05 NOTE — Patient Instructions (Signed)
Medication Instructions: Your physician recommends that you continue on your current medications as directed. Please refer to the Current Medication list given to you today.   Labwork: None today  Procedures/Testing: We will call you next year to schedule abdominal US  Follow-Up: 1 year with Dr.McDowell  Any Additional Special Instructions Will Be Listed Below (If Applicable).     If you need a refill on your cardiac medications before your next appointment, please call your pharmacy.

## 2018-03-05 NOTE — Patient Instructions (Signed)
Description   Take 1 1/2 tablets today then resume 1 tablet daily, except Sunday  Continue greens Recheck in 4 weeks

## 2018-03-21 DIAGNOSIS — N182 Chronic kidney disease, stage 2 (mild): Secondary | ICD-10-CM | POA: Insufficient documentation

## 2018-03-21 DIAGNOSIS — E876 Hypokalemia: Secondary | ICD-10-CM | POA: Insufficient documentation

## 2018-03-21 DIAGNOSIS — Z9989 Dependence on other enabling machines and devices: Secondary | ICD-10-CM

## 2018-03-21 DIAGNOSIS — N400 Enlarged prostate without lower urinary tract symptoms: Secondary | ICD-10-CM | POA: Insufficient documentation

## 2018-03-21 DIAGNOSIS — G4733 Obstructive sleep apnea (adult) (pediatric): Secondary | ICD-10-CM

## 2018-04-02 ENCOUNTER — Ambulatory Visit (INDEPENDENT_AMBULATORY_CARE_PROVIDER_SITE_OTHER): Payer: Medicare Other | Admitting: *Deleted

## 2018-04-02 ENCOUNTER — Other Ambulatory Visit: Payer: Self-pay

## 2018-04-02 DIAGNOSIS — Z5181 Encounter for therapeutic drug level monitoring: Secondary | ICD-10-CM | POA: Diagnosis not present

## 2018-04-02 DIAGNOSIS — I482 Chronic atrial fibrillation, unspecified: Secondary | ICD-10-CM | POA: Diagnosis not present

## 2018-04-02 LAB — POCT INR: INR: 2.2 (ref 2.0–3.0)

## 2018-04-02 NOTE — Patient Instructions (Signed)
Continue coumadin 1 tablet daily except 1/2 tablet on Sundays Continue greens Recheck in 4 weeks

## 2018-04-21 ENCOUNTER — Telehealth: Payer: Self-pay | Admitting: *Deleted

## 2018-04-21 NOTE — Telephone Encounter (Signed)
Glendell Docker - patient's son called wanting to know if his father can wait another month to have coumdin checked.  Please call [phone].

## 2018-04-22 NOTE — Telephone Encounter (Signed)
Spoke to pt's son after reviewing pt's previous INR records.  Due to COVID 19 and pt having stable INR's appt moved out till 05/27/18.  Son in agreement.

## 2018-05-13 ENCOUNTER — Ambulatory Visit (INDEPENDENT_AMBULATORY_CARE_PROVIDER_SITE_OTHER): Payer: Medicare Other | Admitting: Nurse Practitioner

## 2018-05-27 ENCOUNTER — Other Ambulatory Visit: Payer: Self-pay

## 2018-05-27 ENCOUNTER — Ambulatory Visit (INDEPENDENT_AMBULATORY_CARE_PROVIDER_SITE_OTHER): Payer: Medicare Other | Admitting: *Deleted

## 2018-05-27 DIAGNOSIS — I482 Chronic atrial fibrillation, unspecified: Secondary | ICD-10-CM | POA: Diagnosis not present

## 2018-05-27 DIAGNOSIS — Z5181 Encounter for therapeutic drug level monitoring: Secondary | ICD-10-CM | POA: Diagnosis not present

## 2018-05-27 LAB — POCT INR: INR: 1.9 — AB (ref 2.0–3.0)

## 2018-05-27 NOTE — Patient Instructions (Signed)
Increase coumadin to 1 tablet daily Continue greens Recheck in 5 weeks

## 2018-06-04 ENCOUNTER — Other Ambulatory Visit: Payer: Self-pay | Admitting: Cardiology

## 2018-06-04 MED ORDER — POTASSIUM CHLORIDE CRYS ER 20 MEQ PO TBCR: 20.0000 meq | EXTENDED_RELEASE_TABLET | Freq: Every day | ORAL | 1 refills | Status: DC

## 2018-06-04 NOTE — Telephone Encounter (Signed)
Medication sent to pharmacy  

## 2018-06-04 NOTE — Telephone Encounter (Signed)
° ° °  1. Which medications need to be refilled? (please list name of each medication and dose if known)     potassium chloride SA (K-DUR,KLOR-CON) 20 MEQ tablet [[id number]]    2. Which pharmacy/location (including street and city if local pharmacy) is medication to be sent to? [facility], [place], [place]   Fax # [phone]   3. Do they need a 30 day or 90 day supply?    Patient has requested to switch his pharmacy to [facility]

## 2018-07-03 ENCOUNTER — Ambulatory Visit (INDEPENDENT_AMBULATORY_CARE_PROVIDER_SITE_OTHER): Payer: Medicare Other | Admitting: *Deleted

## 2018-07-03 DIAGNOSIS — I482 Chronic atrial fibrillation, unspecified: Secondary | ICD-10-CM

## 2018-07-03 DIAGNOSIS — Z5181 Encounter for therapeutic drug level monitoring: Secondary | ICD-10-CM

## 2018-07-03 LAB — POCT INR: INR: 2.6 (ref 2.0–3.0)

## 2018-07-03 NOTE — Patient Instructions (Signed)
Continue coumadin 1 tablet daily  Continue greens Recheck in 4 weeks 

## 2018-07-31 ENCOUNTER — Ambulatory Visit (INDEPENDENT_AMBULATORY_CARE_PROVIDER_SITE_OTHER): Payer: Medicare Other | Admitting: *Deleted

## 2018-07-31 DIAGNOSIS — I482 Chronic atrial fibrillation, unspecified: Secondary | ICD-10-CM

## 2018-07-31 DIAGNOSIS — Z5181 Encounter for therapeutic drug level monitoring: Secondary | ICD-10-CM | POA: Diagnosis not present

## 2018-07-31 LAB — POCT INR: INR: 3.6 — AB (ref 2.0–3.0)

## 2018-07-31 NOTE — Patient Instructions (Signed)
Took 5 days of prednisone for facial pain.  Will start another 5 days today. Hold coumadin tonight, take 1 tablet Friday and 1/2 tablet Saturday.  Sunday (7/12) resume 1 tablet daily Continue greens Recheck in 4 weeks

## 2018-09-11 ENCOUNTER — Other Ambulatory Visit: Payer: Self-pay

## 2018-09-11 ENCOUNTER — Ambulatory Visit (INDEPENDENT_AMBULATORY_CARE_PROVIDER_SITE_OTHER): Payer: Medicare Other | Admitting: *Deleted

## 2018-09-11 DIAGNOSIS — I482 Chronic atrial fibrillation, unspecified: Secondary | ICD-10-CM | POA: Diagnosis not present

## 2018-09-11 DIAGNOSIS — Z5181 Encounter for therapeutic drug level monitoring: Secondary | ICD-10-CM

## 2018-09-11 LAB — POCT INR: INR: 2.3 (ref 2.0–3.0)

## 2018-09-11 NOTE — Patient Instructions (Signed)
Description   Continue taking  1 tablet daily.  Recheck in 4 weeks.      

## 2018-10-08 ENCOUNTER — Ambulatory Visit (INDEPENDENT_AMBULATORY_CARE_PROVIDER_SITE_OTHER): Payer: Medicare Other | Admitting: *Deleted

## 2018-10-08 ENCOUNTER — Other Ambulatory Visit: Payer: Self-pay

## 2018-10-08 DIAGNOSIS — Z5181 Encounter for therapeutic drug level monitoring: Secondary | ICD-10-CM | POA: Diagnosis not present

## 2018-10-08 DIAGNOSIS — I482 Chronic atrial fibrillation, unspecified: Secondary | ICD-10-CM | POA: Diagnosis not present

## 2018-10-08 LAB — POCT INR: INR: 2.6 (ref 2.0–3.0)

## 2018-10-08 NOTE — Patient Instructions (Signed)
Continue taking 1 tablet daily.  Re-check in 6 weeks. ?

## 2018-11-14 ENCOUNTER — Telehealth: Payer: Self-pay

## 2018-11-14 NOTE — Telephone Encounter (Signed)
Talked with pt son about switching from warfarin to Eliquis. He expressed they had a similar conversation with the provider before and do not want to make the switch at this time given his age and controlled on warfarin.

## 2018-11-19 ENCOUNTER — Ambulatory Visit (INDEPENDENT_AMBULATORY_CARE_PROVIDER_SITE_OTHER): Payer: Medicare Other | Admitting: *Deleted

## 2018-11-19 ENCOUNTER — Other Ambulatory Visit: Payer: Self-pay

## 2018-11-19 DIAGNOSIS — I482 Chronic atrial fibrillation, unspecified: Secondary | ICD-10-CM

## 2018-11-19 DIAGNOSIS — Z5181 Encounter for therapeutic drug level monitoring: Secondary | ICD-10-CM

## 2018-11-19 LAB — POCT INR: INR: 2.7 (ref 2.0–3.0)

## 2018-11-19 NOTE — Patient Instructions (Signed)
Continue taking 1 tablet daily.  Re-check in 6 weeks. ?

## 2018-12-05 ENCOUNTER — Other Ambulatory Visit (INDEPENDENT_AMBULATORY_CARE_PROVIDER_SITE_OTHER): Payer: Self-pay | Admitting: Internal Medicine

## 2018-12-05 ENCOUNTER — Other Ambulatory Visit: Payer: Self-pay | Admitting: Cardiology

## 2018-12-31 ENCOUNTER — Other Ambulatory Visit: Payer: Self-pay

## 2018-12-31 ENCOUNTER — Ambulatory Visit (INDEPENDENT_AMBULATORY_CARE_PROVIDER_SITE_OTHER): Payer: Medicare Other | Admitting: *Deleted

## 2018-12-31 DIAGNOSIS — I482 Chronic atrial fibrillation, unspecified: Secondary | ICD-10-CM

## 2018-12-31 DIAGNOSIS — Z5181 Encounter for therapeutic drug level monitoring: Secondary | ICD-10-CM

## 2018-12-31 LAB — POCT INR: INR: 2.5 (ref 2.0–3.0)

## 2018-12-31 NOTE — Patient Instructions (Signed)
Continue taking 1 tablet daily.  Re-check in 6 weeks. ?

## 2019-01-06 ENCOUNTER — Other Ambulatory Visit: Payer: Self-pay | Admitting: Cardiology

## 2019-01-21 ENCOUNTER — Other Ambulatory Visit (INDEPENDENT_AMBULATORY_CARE_PROVIDER_SITE_OTHER): Payer: Self-pay | Admitting: Internal Medicine

## 2019-01-21 ENCOUNTER — Telehealth (INDEPENDENT_AMBULATORY_CARE_PROVIDER_SITE_OTHER): Payer: Self-pay

## 2019-01-21 MED ORDER — TADALAFIL 5 MG PO TABS: 5.0000 mg | ORAL_TABLET | Freq: Every day | ORAL | 3 refills | Status: DC

## 2019-01-21 NOTE — Telephone Encounter (Signed)
Talked with daughter. Resolve her concern about medication /insurance.

## 2019-02-10 ENCOUNTER — Encounter (INDEPENDENT_AMBULATORY_CARE_PROVIDER_SITE_OTHER): Payer: Self-pay | Admitting: Nurse Practitioner

## 2019-02-10 ENCOUNTER — Ambulatory Visit (INDEPENDENT_AMBULATORY_CARE_PROVIDER_SITE_OTHER): Payer: Medicare PPO | Admitting: Nurse Practitioner

## 2019-02-10 ENCOUNTER — Other Ambulatory Visit: Payer: Self-pay

## 2019-02-10 ENCOUNTER — Ambulatory Visit (INDEPENDENT_AMBULATORY_CARE_PROVIDER_SITE_OTHER): Payer: Medicare PPO | Admitting: *Deleted

## 2019-02-10 VITALS — BP 120/64 | HR 69 | Temp 98.0°F | Resp 18 | Ht 68.0 in | Wt 227.0 lb

## 2019-02-10 DIAGNOSIS — Z0000 Encounter for general adult medical examination without abnormal findings: Secondary | ICD-10-CM | POA: Diagnosis not present

## 2019-02-10 DIAGNOSIS — Z5181 Encounter for therapeutic drug level monitoring: Secondary | ICD-10-CM

## 2019-02-10 DIAGNOSIS — I10 Essential (primary) hypertension: Secondary | ICD-10-CM | POA: Diagnosis not present

## 2019-02-10 DIAGNOSIS — I482 Chronic atrial fibrillation, unspecified: Secondary | ICD-10-CM | POA: Diagnosis not present

## 2019-02-10 LAB — POCT INR: INR: 2.5 (ref 2.0–3.0)

## 2019-02-10 MED ORDER — POTASSIUM CHLORIDE CRYS ER 10 MEQ PO TBCR: 10.0000 meq | EXTENDED_RELEASE_TABLET | Freq: Every day | ORAL | 0 refills | Status: DC

## 2019-02-10 NOTE — Progress Notes (Signed)
Subjective:   Tristan Lopez is a 86 y.o. male who presents for Medicare Annual/Subsequent preventive examination.   Cardiac Risk Factors include: advanced age (>21men, >13 women);obesity (BMI >30kg/m2);male gender;hypertension   He also mentions to me that the hernia on his right lower abdomen appears to be more prominent than it used to be.  He notices it mostly when standing up.  It can be painful at times.  He tells me it is soft and reducible.  He tells me he is having regular bowel movements.  He is interested in possibly and referred to general surgery.  He tells me he is scheduled to receive his second Covid 19 vaccine in about 1 month.  He is are taken the first vaccine is tolerated this well without serious side effects.    Objective:    Vitals: BP 120/64 (BP Location: Right Arm, Patient Position: Sitting, Cuff Size: Normal)   Pulse 69   Temp 98 F (36.7 C) (Temporal)   Resp 18   Ht 5\' 8"  (1.727 m)   Wt 227 lb (103 kg)   SpO2 98%   BMI 34.52 kg/m   Body mass index is 34.52 kg/m.  Advanced Directives 02/10/2019 01/11/2018 04/03/2017 04/01/2015 12/02/2014 11/01/2011  Does Patient Have a Medical Advance Directive? Yes Yes Yes Yes No;Yes Patient does not have advance directive  Type of Advance Directive Out of facility DNR (pink MOST or yellow form) [place];Living will [place];Living will Living will Out of facility DNR (pink MOST or yellow form) -  Does patient want to make changes to medical advance directive? No - Patient declined No - Patient declined - No - Patient declined - -  Copy of [place] in Chart? - - - No - copy requested No - copy requested -  Would patient like information on creating a medical advance directive? - - - Yes - Educational materials given - -  Pre-existing out of facility DNR order (yellow form or pink MOST form) - - - - - No    Tobacco Social History   Tobacco Use  Smoking  Status Former Smoker  . Packs/day: 0.50  . Types: Cigarettes  . Quit date: 45  . Years since quitting: 29.0  Smokeless Tobacco Never Used     Counseling given: No   Clinical Intake:  Pre-visit preparation completed: Yes  Pain : No/denies pain     Nutritional Status: BMI 25 -29 Overweight Nutritional Risks: None Diabetes: No  How often do you need to have someone help you when you read instructions, pamphlets, or other written materials from your doctor or pharmacy?: 1 - Never What is the last grade level you completed in school?: 12th grade  Interpreter Needed?: No  Information entered by :: Jeralyn Ruths, NP-C  Past Medical History:  Diagnosis Date  . Abdominal aortic aneurysm without rupture ([place])   . Bilateral renal cysts   . BPH (benign prostatic hyperplasia)   . Chronic atrial fibrillation ([place])   . COPD (chronic obstructive pulmonary disease) ([place])   . Diverticulosis    Pancolonic  . Essential hypertension   . Hemorrhoids   . Hiatal hernia   . Hypercholesteremia   . Liver cyst    Benign by liver biopsy September 2013  . Nephrolithiasis   . Pre-diabetes   . Reflux esophagitis   . Sleep apnea    Uses CPAP  . Tubular adenoma    Past Surgical History:  Procedure  Laterality Date  . COLONOSCOPY     2003, no polyps per patient. Dr. West Carbo  . COLONOSCOPY  11/01/11   Dr. Gala Romney- tubular adenoma,suboptimal preparation, internal hemorrhoids, o/w normal rectum. pancolonic diverticulosis  . ESOPHAGOGASTRODUODENOSCOPY  11/01/11   Dr. Gala Romney- erosive reflux esophagitis along with a patulous EG junction, hialtal hernia, antral erosions with possible area of healing ulceration- s/p bx= chronic erosive gastritis, no malignancy  . HERNIA REPAIR     x2, bilateral inguinal   . KIDNEY STONE SURGERY    . KNEE ARTHROSCOPY     X2  . NASAL ENDOSCOPY     Family History  Problem Relation Age of Onset  . Breast cancer Mother   . Other Mother        Essential tremor  .  Stroke Mother   . Heart disease Father        Pacemaker  . Heart attack Maternal Grandfather   . Colon cancer Neg Hx   . Liver disease Neg Hx    Social History   Socioeconomic History  . Marital status: Married    Spouse name: Not on file  . Number of children: 3  . Years of education: Not on file  . Highest education level: Not on file  Occupational History    Employer: RETIRED  Tobacco Use  . Smoking status: Former Smoker    Packs/day: 0.50    Types: Cigarettes    Quit date: 1992    Years since quitting: 29.0  . Smokeless tobacco: Never Used  Substance and Sexual Activity  . Alcohol use: No  . Drug use: No  . Sexual activity: Not on file  Other Topics Concern  . Not on file  Social History Narrative  . Not on file   Social Determinants of Health   Financial Resource Strain:   . Difficulty of Paying Living Expenses: Not on file  Food Insecurity:   . Worried About Charity fundraiser in the Last Year: Not on file  . Ran Out of Food in the Last Year: Not on file  Transportation Needs:   . Lack of Transportation (Medical): Not on file  . Lack of Transportation (Non-Medical): Not on file  Physical Activity:   . Days of Exercise per Week: Not on file  . Minutes of Exercise per Session: Not on file  Stress:   . Feeling of Stress : Not on file  Social Connections:   . Frequency of Communication with Friends and Family: Not on file  . Frequency of Social Gatherings with Friends and Family: Not on file  . Attends Religious Services: Not on file  . Active Member of Clubs or Organizations: Not on file  . Attends Archivist Meetings: Not on file  . Marital Status: Not on file    Outpatient Encounter Medications as of 02/10/2019  Medication Sig  . Cholecalciferol (VITAMIN D-3) 1000 units CAPS Take 2,000 Units by mouth.  . Garlic 123XX123 MG CAPS Take 1,000 mg by mouth daily.  . potassium chloride (KLOR-CON) 10 MEQ tablet Take 1 tablet (10 mEq total) by mouth  daily.  . Probiotic Product (DIGESTIVE ADVANTAGE PO) Take by mouth 2 (two) times daily.   . propranolol (INDERAL) 20 MG tablet Take 20 mg by mouth daily.   . tadalafil (CIALIS) 5 MG tablet Take 1 tablet (5 mg total) by mouth daily.  Marland Kitchen warfarin (COUMADIN) 5 MG tablet Take 1 tablet daily except 1/2 tablet on Wednesdays (Patient taking differently: Take 1  tablet daily except 1/2 tablet on Sunday)  . [DISCONTINUED] potassium chloride (KLOR-CON) 10 MEQ tablet TAKE ONE TABLET BY MOUTH EVERY DAY  . amLODipine (NORVASC) 10 MG tablet TAKE ONE TABLET BY MOUTH EVERY DAY  . benazepril (LOTENSIN) 40 MG tablet TAKE 1 TABLET BY MOUTH ONCE DAILY  . pantoprazole (PROTONIX) 40 MG tablet TAKE 1 TABLET BY MOUTH TWICE DAILY  . rosuvastatin (CRESTOR) 10 MG tablet TAKE 1 TABLET BY MOUTH ONCE DAILY  . Wheat Dextrin (BENEFIBER DRINK MIX PO) Take by mouth.  . [DISCONTINUED] amLODipine (NORVASC) 10 MG tablet Take 10 mg by mouth daily.  . [DISCONTINUED] benazepril (LOTENSIN) 40 MG tablet TAKE ONE TABLET BY MOUTH ONCE DAILY  . [DISCONTINUED] oxybutynin (DITROPAN-XL) 10 MG 24 hr tablet Take 10 mg by mouth daily.  . [DISCONTINUED] pantoprazole (PROTONIX) 40 MG tablet Take 1 tablet (40 mg total) by mouth 2 (two) times daily.  . [DISCONTINUED] rosuvastatin (CRESTOR) 10 MG tablet Take 1 tablet (10 mg total) by mouth daily.  . [DISCONTINUED] tadalafil (CIALIS) 5 MG tablet Take 5 mg by mouth daily.   No facility-administered encounter medications on file as of 02/10/2019.    Activities of Daily Living In your present state of health, do you have any difficulty performing the following activities: 02/10/2019  Hearing? Y  Vision? N  Difficulty concentrating or making decisions? Y  Walking or climbing stairs? N  Dressing or bathing? N  Doing errands, shopping? Y  Preparing Food and eating ? N  Using the Toilet? N  In the past six months, have you accidently leaked urine? Y  Do you have problems with loss of bowel control? N    Managing your Medications? N  Managing your Finances? Y  Housekeeping or managing your Housekeeping? Y  Some recent data might be hidden    Patient Care Team: [name], MD as PCP - General (Internal Medicine) Satira Sark, MD as PCP - Cardiology (Cardiology) Daneil Dolin, MD (Gastroenterology)   Assessment:   This is a routine wellness examination for [place].  Exercise Activities and Dietary recommendations Current Exercise Habits: The patient does not participate in regular exercise at present, Exercise limited by: cardiac condition(s);orthopedic condition(s)  Goals    . DIET - EAT MORE FRUITS AND VEGETABLES     Try to eat a light healthy dinner daily.       Fall Risk Fall Risk  02/10/2019  Falls in the past year? 1  Number falls in past yr: 1  Injury with Fall? 0  Risk for fall due to : History of fall(s);Impaired balance/gait  Follow up Falls prevention discussed;Education provided   Is the patient's home free of loose throw rugs in walkways, pet beds, electrical cords, etc?   yes      Grab bars in the bathroom? yes      Handrails on the stairs?   yes      Adequate lighting?   yes  Timed Get Up and Go Performed: 28  Depression Screen PHQ 2/9 Scores 02/10/2019  PHQ - 2 Score 0    Cognitive Function     6CIT Screen 02/10/2019  What Year? 0 points  What month? 0 points  What time? 0 points  Count back from 20 0 points  Months in reverse 0 points  Repeat phrase 8 points  Total Score 8    Immunization History  Administered Date(s) Administered  . DTaP 12/10/2016  . Influenza,inj,Quad PF,6+ Mos 11/23/2018  . Influenza-Unspecified 10/29/2014  .  Pneumococcal Conjugate-13 10/29/2014, 12/26/2015  . Pneumococcal Polysaccharide-23 12/16/2015  . Pneumococcal-Unspecified 10/29/2014  . Zoster 08/20/2017    Qualifies for Shingles Vaccine?  Already been completed  Screening Tests Health Maintenance  Topic Date Due  . TETANUS/TDAP  01/06/1950   . INFLUENZA VACCINE  Completed  . PNA vac Low Risk Adult  Completed   Cancer Screenings: Lung: Low Dose CT Chest recommended if Age 83-80 years, 30 pack-year currently smoking OR have quit w/in 15years. Patient does not qualify. Colorectal: Not applicable         Plan:   He will be due for his flu shot next year.  I encouraged him to go back to his pharmacy as scheduled for his Covid shot.  He will be due for annual wellness visit again in 1 year.  We did discuss his hernia and I encouraged him to let me know when and if he would like referral to general surgery.  We did discuss red flag symptoms in which he would need to be seen in the emergency department.  He tells me he understands.  We also discussed that if he were to fall and hit his head he should follow-up in emergency department to rule out a brain bleed.  He tells me he understands.  I have personally reviewed and noted the following in the patient's chart:   . Medical and social history . Use of alcohol, tobacco or illicit drugs  . Current medications and supplements . Functional ability and status . Nutritional status . Physical activity . Advanced directives . List of other physicians . Hospitalizations, surgeries, and ER visits in previous 12 months . Vitals . Screenings to include cognitive, depression, and falls . Referrals and appointments  In addition, I have reviewed and discussed with patient certain preventive protocols, quality metrics, and best practice recommendations. A written personalized care plan for preventive services as well as general preventive health recommendations were provided to patient.     Ailene Ards, NP  02/10/2019

## 2019-02-10 NOTE — Patient Instructions (Signed)
Continue taking 1 tablet daily.  Re-check in 6 weeks. ?

## 2019-02-10 NOTE — Patient Instructions (Signed)
Thank you for choosing [facility] as your medical provider! If you have any questions or concerns regarding your health care, please do not hesitate to call our office.  You will be due for her flu shot next year.  Please follow-up as scheduled for her second COVID-19 vaccination.  You are up-to-date with all recommended screenings for now.  Next year you will be due for depression and fall screening again.  Please keep in mind the discussion we had regarding falls, and if you were to fall and hit your head to follow-up in the emergency department to rule out any bleeding on the brain.  Please let me know if you would like me to send a referral to general surgery for further evaluation of hernia.  Please follow-up as scheduled in 2 months. We look forward to seeing you again soon!   At [facility] we value your feedback. You may receive a survey about your visit today. Please share your experience as we strive to create trusting relationships with our patients to provide genuine, compassionate, quality care.  We appreciate your understanding and patience as we review any laboratory studies, imaging, and other diagnostic tests that are ordered as we care for you. We do our best to address any and all results in a timely manner. If you do not hear about test results within 1 week, please do not hesitate to contact us. If we referred you to a specialist during your visit or ordered imaging testing, contact the office if you have not been contacted to be scheduled within 1 weeks.  We also encourage the use of MyChart, which contains your medical information for your review as well. If you are not enrolled in this feature, an access code is on this after visit summary for your convenience. Thank you for allowing [place] to be involved in your care.

## 2019-03-05 ENCOUNTER — Telehealth (INDEPENDENT_AMBULATORY_CARE_PROVIDER_SITE_OTHER): Payer: Self-pay

## 2019-03-05 ENCOUNTER — Encounter (HOSPITAL_COMMUNITY): Payer: Self-pay

## 2019-03-05 ENCOUNTER — Emergency Department (HOSPITAL_COMMUNITY)
Admission: EM | Admit: 2019-03-05 | Discharge: 2019-03-05 | Disposition: A | Discharge status: Home / Self Care | Payer: Medicare PPO | Attending: Emergency Medicine | Admitting: Emergency Medicine

## 2019-03-05 ENCOUNTER — Ambulatory Visit
Admission: EM | Admit: 2019-03-05 | Discharge: 2019-03-05 | Disposition: A | Discharge status: Home / Self Care | Payer: Medicare PPO | Source: Home / Self Care

## 2019-03-05 ENCOUNTER — Other Ambulatory Visit: Payer: Self-pay

## 2019-03-05 DIAGNOSIS — H1132 Conjunctival hemorrhage, left eye: Secondary | ICD-10-CM | POA: Diagnosis not present

## 2019-03-05 DIAGNOSIS — Z7901 Long term (current) use of anticoagulants: Secondary | ICD-10-CM | POA: Insufficient documentation

## 2019-03-05 DIAGNOSIS — I10 Essential (primary) hypertension: Secondary | ICD-10-CM | POA: Diagnosis not present

## 2019-03-05 DIAGNOSIS — Z79899 Other long term (current) drug therapy: Secondary | ICD-10-CM | POA: Insufficient documentation

## 2019-03-05 DIAGNOSIS — J449 Chronic obstructive pulmonary disease, unspecified: Secondary | ICD-10-CM | POA: Insufficient documentation

## 2019-03-05 DIAGNOSIS — H5789 Other specified disorders of eye and adnexa: Secondary | ICD-10-CM | POA: Diagnosis present

## 2019-03-05 DIAGNOSIS — Z9229 Personal history of other drug therapy: Secondary | ICD-10-CM

## 2019-03-05 DIAGNOSIS — Z87891 Personal history of nicotine dependence: Secondary | ICD-10-CM | POA: Diagnosis not present

## 2019-03-05 DIAGNOSIS — Z91040 Latex allergy status: Secondary | ICD-10-CM | POA: Diagnosis not present

## 2019-03-05 DIAGNOSIS — Z87898 Personal history of other specified conditions: Secondary | ICD-10-CM

## 2019-03-05 LAB — CBC WITH DIFFERENTIAL/PLATELET
Abs Immature Granulocytes: 0.02 10*3/uL (ref 0.00–0.07)
Basophils Absolute: 0 10*3/uL (ref 0.0–0.1)
Basophils Relative: 0 %
Eosinophils Absolute: 0.1 10*3/uL (ref 0.0–0.5)
Eosinophils Relative: 2 %
HCT: 40.7 % (ref 39.0–52.0)
Hemoglobin: 12.6 g/dL — ABNORMAL LOW (ref 13.0–17.0)
Immature Granulocytes: 0 %
Lymphocytes Relative: 27 %
Lymphs Abs: 1.3 10*3/uL (ref 0.7–4.0)
MCH: 29.8 pg (ref 26.0–34.0)
MCHC: 31 g/dL (ref 30.0–36.0)
MCV: 96.2 fL (ref 80.0–100.0)
Monocytes Absolute: 0.4 10*3/uL (ref 0.1–1.0)
Monocytes Relative: 9 %
Neutro Abs: 2.9 10*3/uL (ref 1.7–7.7)
Neutrophils Relative %: 62 %
Platelets: 182 10*3/uL (ref 150–400)
RBC: 4.23 MIL/uL (ref 4.22–5.81)
RDW: 13.3 % (ref 11.5–15.5)
WBC: 4.8 10*3/uL (ref 4.0–10.5)
nRBC: 0 % (ref 0.0–0.2)

## 2019-03-05 LAB — COMPREHENSIVE METABOLIC PANEL
ALT: 12 U/L (ref 0–44)
AST: 18 U/L (ref 15–41)
Albumin: 4 g/dL (ref 3.5–5.0)
Alkaline Phosphatase: 60 U/L (ref 38–126)
Anion gap: 7 (ref 5–15)
BUN: 16 mg/dL (ref 8–23)
CO2: 28 mmol/L (ref 22–32)
Calcium: 9 mg/dL (ref 8.9–10.3)
Chloride: 102 mmol/L (ref 98–111)
Creatinine, Ser: 0.72 mg/dL (ref 0.61–1.24)
GFR calc Af Amer: >60 mL/min (ref >60)
GFR calc non Af Amer: >60 mL/min (ref >60)
Glucose, Bld: 101 mg/dL — ABNORMAL HIGH (ref 70–99)
Potassium: 3.9 mmol/L (ref 3.5–5.1)
Sodium: 137 mmol/L (ref 135–145)
Total Bilirubin: 1.1 mg/dL (ref 0.3–1.2)
Total Protein: 7.2 g/dL (ref 6.5–8.1)

## 2019-03-05 LAB — PROTIME-INR
INR: 2.2 — ABNORMAL HIGH (ref 0.8–1.2)
Prothrombin Time: 24.1 seconds — ABNORMAL HIGH (ref 11.4–15.2)

## 2019-03-05 NOTE — Telephone Encounter (Signed)
Pt son Tristan Lopez called wand wanted him to be seen to the blood shot eye/ facial area. It started last night after being in the bathroom before bed. He is at dentist office.   Gave instructions to go to Urgent care office on [address] And he can be evaluated there.

## 2019-03-05 NOTE — ED Notes (Signed)
Family member notified that we are waiting on consult from ophthalmology. Patient also informed.

## 2019-03-05 NOTE — ED Triage Notes (Signed)
Pt presents to ED with complaints of redness to left eye started yesterday.  Pt denies pain or loss of vision.

## 2019-03-05 NOTE — ED Provider Notes (Addendum)
[facility] EMERGENCY DEPARTMENT Provider Note   CSN: [id number] Arrival date & time: 03/05/19  1456     History Chief Complaint  Patient presents with  . Eye Problem    Tristan Lopez is a 86 y.o. male.  Patient complains of redness in left eye no vision problems no history of trauma  The history is provided by the patient. No language interpreter was used.  Eye Problem Location:  Left eye Quality:  Unable to specify Severity:  Mild Onset quality:  Sudden Timing:  Constant Progression:  Unable to specify Chronicity:  New Context: not burn   Associated symptoms: no discharge and no headaches        Past Medical History:  Diagnosis Date  . Abdominal aortic aneurysm without rupture ([place])   . Bilateral renal cysts   . BPH (benign prostatic hyperplasia)   . Chronic atrial fibrillation ([place])   . COPD (chronic obstructive pulmonary disease) ([place])   . Diverticulosis    Pancolonic  . Essential hypertension   . Hemorrhoids   . Hiatal hernia   . Hypercholesteremia   . Liver cyst    Benign by liver biopsy September 2013  . Nephrolithiasis   . Pre-diabetes   . Reflux esophagitis   . Sleep apnea    Uses CPAP  . Tubular adenoma     Patient Active Problem List   Diagnosis Date Noted  . Hx of adenomatous colonic polyps 01/17/2017  . Hoarseness 01/17/2017  . Hyperlipidemia 11/19/2013  . AAA (abdominal aortic aneurysm) without rupture ([place]) 02/17/2013  . Encounter for therapeutic drug monitoring 02/12/2013  . Essential hypertension 05/12/2012  . Long term (current) use of anticoagulants 03/27/2012  . Chronic atrial fibrillation ([place]) 03/13/2012  . GERD (gastroesophageal reflux disease) 10/08/2011  . Constipation 10/08/2011  . High risk medication use 10/08/2011    Past Surgical History:  Procedure Laterality Date  . COLONOSCOPY     2003, no polyps per patient. Dr. West Carbo  . COLONOSCOPY  11/01/11   Dr. Gala Romney- tubular adenoma,suboptimal preparation,  internal hemorrhoids, o/w normal rectum. pancolonic diverticulosis  . ESOPHAGOGASTRODUODENOSCOPY  11/01/11   Dr. Gala Romney- erosive reflux esophagitis along with a patulous EG junction, hialtal hernia, antral erosions with possible area of healing ulceration- s/p bx= chronic erosive gastritis, no malignancy  . HERNIA REPAIR     x2, bilateral inguinal   . KIDNEY STONE SURGERY    . KNEE ARTHROSCOPY     X2  . NASAL ENDOSCOPY         Family History  Problem Relation Age of Onset  . Breast cancer Mother   . Other Mother        Essential tremor  . Stroke Mother   . Heart disease Father        Pacemaker  . Heart attack Maternal Grandfather   . Colon cancer Neg Hx   . Liver disease Neg Hx     Social History   Tobacco Use  . Smoking status: Former Smoker    Packs/day: 0.50    Types: Cigarettes    Quit date: 1992    Years since quitting: 29.1  . Smokeless tobacco: Never Used  Substance Use Topics  . Alcohol use: No  . Drug use: No    Home Medications Prior to Admission medications   Medication Sig Start Date End Date Taking? Authorizing Provider  amLODipine (NORVASC) 10 MG tablet TAKE ONE TABLET BY MOUTH EVERY DAY 12/05/18 01/04/19  [name], MD  benazepril (LOTENSIN)  40 MG tablet TAKE 1 TABLET BY MOUTH ONCE DAILY 12/05/18 01/04/19  Hurshel Party C, MD  Cholecalciferol (VITAMIN D-3) 1000 units CAPS Take 2,000 Units by mouth.    [provider]  Garlic 123XX123 MG CAPS Take 1,000 mg by mouth daily.    [provider]  pantoprazole (PROTONIX) 40 MG tablet TAKE 1 TABLET BY MOUTH TWICE DAILY 12/05/18 01/04/19  Hurshel Party C, MD  potassium chloride (KLOR-CON) 10 MEQ tablet Take 1 tablet (10 mEq total) by mouth daily. 02/10/19   Ailene Ards, NP  Probiotic Product (DIGESTIVE ADVANTAGE PO) Take by mouth 2 (two) times daily.     [provider]  propranolol (INDERAL) 20 MG tablet Take 20 mg by mouth daily.     [provider]  rosuvastatin  (CRESTOR) 10 MG tablet TAKE 1 TABLET BY MOUTH ONCE DAILY 12/05/18 01/04/19  Hurshel Party C, MD  tadalafil (CIALIS) 5 MG tablet Take 1 tablet (5 mg total) by mouth daily. 01/21/19 02/20/19  [name], MD  warfarin (COUMADIN) 5 MG tablet Take 1 tablet daily except 1/2 tablet on Wednesdays Patient taking differently: Take 1 tablet daily except 1/2 tablet on Sunday 05/16/16   Josue Hector, MD  Wheat Dextrin (BENEFIBER DRINK MIX PO) Take by mouth.    [provider]  amLODipine (NORVASC) 10 MG tablet Take 10 mg by mouth daily. 02/11/18   [provider]  benazepril (LOTENSIN) 40 MG tablet TAKE ONE TABLET BY MOUTH ONCE DAILY 01/04/14   Satira Sark, MD  oxybutynin (DITROPAN-XL) 10 MG 24 hr tablet Take 10 mg by mouth daily.    [provider]  pantoprazole (PROTONIX) 40 MG tablet Take 1 tablet (40 mg total) by mouth 2 (two) times daily. 08/24/13   Annitta Needs, NP  rosuvastatin (CRESTOR) 10 MG tablet Take 1 tablet (10 mg total) by mouth daily. 02/17/13   Lendon Colonel, NP  tadalafil (CIALIS) 5 MG tablet Take 5 mg by mouth daily.    [provider]    Allergies    Latex  Review of Systems   Review of Systems  Constitutional: Negative for appetite change and fatigue.  HENT: Negative for congestion, ear discharge and sinus pressure.        Redness in left arm  Eyes: Negative for discharge.  Respiratory: Negative for cough.   Cardiovascular: Negative for chest pain.  Gastrointestinal: Negative for abdominal pain and diarrhea.  Genitourinary: Negative for frequency and hematuria.  Musculoskeletal: Negative for back pain.  Skin: Negative for rash.  Neurological: Negative for seizures and headaches.  Psychiatric/Behavioral: Negative for hallucinations.    Physical Exam Updated Vital Signs BP (!) 162/73 (BP Location: Right Arm)   Pulse 66   Temp 98.6 F (37 C) (Oral)   Resp 18   Ht 5\' 9"  (1.753 m)   Wt 100.7 kg   SpO2 99%   BMI 32.78  kg/m   Physical Exam Vitals and nursing note reviewed.  Constitutional:      Appearance: He is well-developed.  HENT:     Head: Normocephalic.     Comments: Pupils equal round reactive light and accommodation.  Vision normal.  Conjunctiva hyperemic and consistent with conjunctival hemorrhage    Mouth/Throat:     Mouth: Mucous membranes are moist.  Eyes:     General: No scleral icterus.    Conjunctiva/sclera: Conjunctivae normal.  Neck:     Thyroid: No thyromegaly.  Cardiovascular:     Rate  and Rhythm: Normal rate and regular rhythm.     Heart sounds: No murmur. No friction rub. No gallop.   Pulmonary:     Breath sounds: No stridor. No wheezing or rales.  Chest:     Chest wall: No tenderness.  Abdominal:     General: There is no distension.     Tenderness: There is no abdominal tenderness. There is no rebound.  Musculoskeletal:        General: Normal range of motion.     Cervical back: Neck supple.  Lymphadenopathy:     Cervical: No cervical adenopathy.  Skin:    Findings: No erythema or rash.  Neurological:     Mental Status: He is alert and oriented to person, place, and time.     Motor: No abnormal muscle tone.     Coordination: Coordination normal.  Psychiatric:        Behavior: Behavior normal.     ED Results / Procedures / Treatments   Labs (all labs ordered are listed, but only abnormal results are displayed) Labs Reviewed  CBC WITH DIFFERENTIAL/PLATELET - Abnormal; Notable for the following components:      Result Value   Hemoglobin 12.6 (*)    All other components within normal limits  COMPREHENSIVE METABOLIC PANEL - Abnormal; Notable for the following components:   Glucose, Bld 101 (*)    All other components within normal limits  PROTIME-INR - Abnormal; Notable for the following components:   Prothrombin Time 24.1 (*)    INR 2.2 (*)    All other components within normal limits    EKG None  Radiology No results found.  Procedures Procedures  (including critical care time)  Medications Ordered in ED Medications - No data to display  ED Course  I have reviewed the triage vital signs and the nursing notes.  Pertinent labs & imaging results that were available during my care of the patient were reviewed by me and considered in my medical decision making (see chart for details).    MDM Rules/Calculators/A&P                      Patient with a conjunctival hemorrhage to the left eye.  I spoke with Dr. Satira Sark ophthalmology and he will see the patient tomorrow.  He recommended continue taking the Coumadin Final Clinical Impression(s) / ED Diagnoses Final diagnoses:  Conjunctival hemorrhage, left    Rx / DC Orders ED Discharge Orders    None       Milton Ferguson, MD 03/06/19 [id number]    Milton Ferguson, MD 03/23/19 [phone]

## 2019-03-05 NOTE — ED Provider Notes (Signed)
[facility]   [id number] 03/05/19 Arrival Time: [id number]  Abbreviated note:  CC: Red eye  SUBJECTIVE: HPI obtained from patient and son LEONHARD DOWIE is a 86 y.o. male who presents with complaint of LT eye redness and nose bleed (now resolved) that began 2 days ago.  Was using the bathroom prior to symptoms.  On coumadin 5 mg daily.  Did not take dose today.  Last INR was 2.5, 3 weeks ago.  Report similar symptoms in the past, but not this severe.  Denies fever, chills, nausea, vomiting, eye pain, painful eye movements, chest pain, SOB, lightheadedness, dizziness.      ROS: As per HPI.  All other pertinent ROS negative.     Past Medical History:  Diagnosis Date  . Abdominal aortic aneurysm without rupture ([place])   . Bilateral renal cysts   . BPH (benign prostatic hyperplasia)   . Chronic atrial fibrillation ([place])   . COPD (chronic obstructive pulmonary disease) ([employer])   . Diverticulosis    Pancolonic  . Essential hypertension   . Hemorrhoids   . Hiatal hernia   . Hypercholesteremia   . Liver cyst    Benign by liver biopsy September 2013  . Nephrolithiasis   . Pre-diabetes   . Reflux esophagitis   . Sleep apnea    Uses CPAP  . Tubular adenoma    Past Surgical History:  Procedure Laterality Date  . COLONOSCOPY     2003, no polyps per patient. Dr. West Carbo  . COLONOSCOPY  11/01/11   Dr. Gala Romney- tubular adenoma,suboptimal preparation, internal hemorrhoids, o/w normal rectum. pancolonic diverticulosis  . ESOPHAGOGASTRODUODENOSCOPY  11/01/11   Dr. Gala Romney- erosive reflux esophagitis along with a patulous EG junction, hialtal hernia, antral erosions with possible area of healing ulceration- s/p bx= chronic erosive gastritis, no malignancy  . HERNIA REPAIR     x2, bilateral inguinal   . KIDNEY STONE SURGERY    . KNEE ARTHROSCOPY     X2  . NASAL ENDOSCOPY     Allergies  Allergen Reactions  . Latex Rash   No current facility-administered medications on file prior  to encounter.   Current Outpatient Medications on File Prior to Encounter  Medication Sig Dispense Refill  . amLODipine (NORVASC) 10 MG tablet TAKE ONE TABLET BY MOUTH EVERY DAY 30 tablet 3  . benazepril (LOTENSIN) 40 MG tablet TAKE 1 TABLET BY MOUTH ONCE DAILY 30 tablet 3  . Cholecalciferol (VITAMIN D-3) 1000 units CAPS Take 2,000 Units by mouth.    . Garlic 123XX123 MG CAPS Take 1,000 mg by mouth daily.    . pantoprazole (PROTONIX) 40 MG tablet TAKE 1 TABLET BY MOUTH TWICE DAILY 60 tablet 3  . potassium chloride (KLOR-CON) 10 MEQ tablet Take 1 tablet (10 mEq total) by mouth daily. 90 tablet 0  . Probiotic Product (DIGESTIVE ADVANTAGE PO) Take by mouth 2 (two) times daily.     . propranolol (INDERAL) 20 MG tablet Take 20 mg by mouth daily.     . rosuvastatin (CRESTOR) 10 MG tablet TAKE 1 TABLET BY MOUTH ONCE DAILY 30 tablet 3  . tadalafil (CIALIS) 5 MG tablet Take 1 tablet (5 mg total) by mouth daily. 30 tablet 3  . warfarin (COUMADIN) 5 MG tablet Take 1 tablet daily except 1/2 tablet on Wednesdays (Patient taking differently: Take 1 tablet daily except 1/2 tablet on Sunday) 100 tablet 3  . Wheat Dextrin (BENEFIBER DRINK MIX PO) Take by mouth.    . [DISCONTINUED] amLODipine (NORVASC)  10 MG tablet Take 10 mg by mouth daily.    . [DISCONTINUED] benazepril (LOTENSIN) 40 MG tablet TAKE ONE TABLET BY MOUTH ONCE DAILY 90 tablet 3  . [DISCONTINUED] oxybutynin (DITROPAN-XL) 10 MG 24 hr tablet Take 10 mg by mouth daily.    . [DISCONTINUED] pantoprazole (PROTONIX) 40 MG tablet Take 1 tablet (40 mg total) by mouth 2 (two) times daily. 180 tablet 1  . [DISCONTINUED] rosuvastatin (CRESTOR) 10 MG tablet Take 1 tablet (10 mg total) by mouth daily. 90 tablet 1  . [DISCONTINUED] tadalafil (CIALIS) 5 MG tablet Take 5 mg by mouth daily.     Social History   Socioeconomic History  . Marital status: Married    Spouse name: Not on file  . Number of children: 3  . Years of education: Not on file  . Highest  education level: Not on file  Occupational History    Employer: RETIRED  Tobacco Use  . Smoking status: Former Smoker    Packs/day: 0.50    Types: Cigarettes    Quit date: 1992    Years since quitting: 29.1  . Smokeless tobacco: Never Used  Substance and Sexual Activity  . Alcohol use: No  . Drug use: No  . Sexual activity: Not on file  Other Topics Concern  . Not on file  Social History Narrative  . Not on file   Social Determinants of Health   Financial Resource Strain:   . Difficulty of Paying Living Expenses: Not on file  Food Insecurity:   . Worried About Charity fundraiser in the Last Year: Not on file  . Ran Out of Food in the Last Year: Not on file  Transportation Needs:   . Lack of Transportation (Medical): Not on file  . Lack of Transportation (Non-Medical): Not on file  Physical Activity:   . Days of Exercise per Week: Not on file  . Minutes of Exercise per Session: Not on file  Stress:   . Feeling of Stress : Not on file  Social Connections:   . Frequency of Communication with Friends and Family: Not on file  . Frequency of Social Gatherings with Friends and Family: Not on file  . Attends Religious Services: Not on file  . Active Member of Clubs or Organizations: Not on file  . Attends Archivist Meetings: Not on file  . Marital Status: Not on file  Intimate Partner Violence:   . Fear of Current or Ex-Partner: Not on file  . Emotionally Abused: Not on file  . Physically Abused: Not on file  . Sexually Abused: Not on file   Family History  Problem Relation Age of Onset  . Breast cancer Mother   . Other Mother        Essential tremor  . Stroke Mother   . Heart disease Father        Pacemaker  . Heart attack Maternal Grandfather   . Colon cancer Neg Hx   . Liver disease Neg Hx     OBJECTIVE:  Vitals:   03/05/19 1434  BP: (!) 159/62  Pulse: 62  Resp: 18  Temp: 98 F (36.7 C)  TempSrc: Temporal  SpO2: 97%    General appearance:  alert; no distress HENT: NCAT; EACs clear, TMs pearly gray; nares patent without rhinorrhea; oropharynx clear Eyes: + subconjunctival erythema. PERRL; EOMI without discomfort;  no obvious drainage or bleeding Neck: supple Lungs: clear to auscultation bilaterally Heart: soft systolic murmur present Neuro: CN 2-12  grossly intact; no obvious facial droop or slurred speech; grip strength equal and intact about the bilateral UE Skin: warm and dry Psychological: alert and cooperative; normal mood and affect   ASSESSMENT & PLAN:  1. Subconjunctival hemorrhage of left eye   2. H/O epistaxis   3. History of Coumadin therapy     Recommending further evaluation and management in the ED for blood work.  Concern for severity of subconjunctival hemorrhage and nosebleed on coumadin.  Last INR 3 weeks ago at 2.5.  Patient and son aware.  Will go by private vehicle to [facility] ED.     Lestine Box, PA-C 03/05/19 1453

## 2019-03-05 NOTE — Discharge Instructions (Addendum)
Call Dr. Mellissa Kohut office tomorrow morning at 830 to set up an appointment to be seen tomorrow.  I spoke with Dr. Satira Sark and he will be glad to see you tomorrow.  Go ahead and take your Coumadin tonight the ophthalmologist stated that it would be fine to continue taking your Coumadin

## 2019-03-05 NOTE — Discharge Instructions (Signed)
Recommending further evaluation and management in the ED for blood work.  Concern for severity of subconjunctival hemorrhage and nosebleed on coumadin.  Last INR 3 weeks ago at 2.5.  Patient and son aware.  Will go by private vehicle to [facility] ED.

## 2019-03-10 ENCOUNTER — Other Ambulatory Visit: Payer: Self-pay

## 2019-03-10 ENCOUNTER — Encounter: Payer: Self-pay | Admitting: Cardiology

## 2019-03-10 ENCOUNTER — Ambulatory Visit: Payer: Medicare PPO | Admitting: Cardiology

## 2019-03-10 VITALS — BP 124/63 | HR 68 | Temp 98.1°F | Ht 69.0 in | Wt 232.0 lb

## 2019-03-10 DIAGNOSIS — I6523 Occlusion and stenosis of bilateral carotid arteries: Secondary | ICD-10-CM | POA: Diagnosis not present

## 2019-03-10 DIAGNOSIS — Z8679 Personal history of other diseases of the circulatory system: Secondary | ICD-10-CM | POA: Diagnosis not present

## 2019-03-10 DIAGNOSIS — I10 Essential (primary) hypertension: Secondary | ICD-10-CM | POA: Diagnosis not present

## 2019-03-10 DIAGNOSIS — I4821 Permanent atrial fibrillation: Secondary | ICD-10-CM

## 2019-03-10 DIAGNOSIS — Z79899 Other long term (current) drug therapy: Secondary | ICD-10-CM | POA: Diagnosis not present

## 2019-03-10 DIAGNOSIS — R600 Localized edema: Secondary | ICD-10-CM

## 2019-03-10 MED ORDER — FUROSEMIDE 20 MG PO TABS: ORAL_TABLET | ORAL | 3 refills | Status: DC

## 2019-03-10 NOTE — Progress Notes (Signed)
Cardiology Office Note  Date: 03/10/2019   ID: Tristan Lopez, DOB 29-Apr-1930, MRN [id number]  PCP:  [name], MD  Cardiologist:  [name], MD Electrophysiologist:  None   Chief Complaint  Patient presents with  . Cardiac follow-up    History of Present Illness: Tristan Lopez is an 86 y.o. male last seen in February 2020.  He presents for a follow-up visit today with his son.  He reports a mild, infrequent sense of heart skipping, no progressive palpitations or chest pain.  No dizziness or syncope.  He has had more frequent nocturia, mild leg swelling, no definite orthopnea.  At the present time he is not on any diuretics.  Last echocardiogram was in 2016.  Records indicate recent ER visit with subconjunctival hemorrhage involving the left eye.  He saw an ophthalmologist thereafter.  No specific intervention required.  No recommendations made to change his anticoagulation status.  He is on Coumadin with follow-up in the anticoagulation clinic.  Recent INR was 2.2 on February 11.  He has generally tolerated this well over time.  I did talk with him about DOAC's.  Carotid Dopplers from March 2019 showed 60 to 79% RICA stenosis and 40 to [id number] LICA stenosis.  He saw Dr. Scot Dock in the past, has not had a follow-up study as yet.  He is due for a follow-up abdominal ultrasound for review of AAA size (previously 3.5 x 3.5 cm in 2018).  I reviewed his medications today which are outlined below.  I personally reviewed his ECG today which shows rate controlled atrial fibrillation with low voltage and nonspecific T wave changes.  Past Medical History:  Diagnosis Date  . Abdominal aortic aneurysm without rupture ([place])   . Bilateral renal cysts   . BPH (benign prostatic hyperplasia)   . Chronic atrial fibrillation ([place]rist[place])   . COPD (chronic obstructive pulmonary disease) ([place])   . Diverticulosis    Pancolonic  . Essential hypertension   . Hemorrhoids   . Hiatal  hernia   . Hypercholesteremia   . Liver cyst    Benign by liver biopsy September 2013  . Nephrolithiasis   . Pre-diabetes   . Reflux esophagitis   . Sleep apnea    Uses CPAP  . Tubular adenoma     Past Surgical History:  Procedure Laterality Date  . COLONOSCOPY     2003, no polyps per patient. Dr. West Carbo  . COLONOSCOPY  11/01/11   Dr. Gala Romney- tubular adenoma,suboptimal preparation, internal hemorrhoids, o/w normal rectum. pancolonic diverticulosis  . ESOPHAGOGASTRODUODENOSCOPY  11/01/11   Dr. Gala Romney- erosive reflux esophagitis along with a patulous EG junction, hialtal hernia, antral erosions with possible area of healing ulceration- s/p bx= chronic erosive gastritis, no malignancy  . HERNIA REPAIR     x2, bilateral inguinal   . KIDNEY STONE SURGERY    . KNEE ARTHROSCOPY     X2  . NASAL ENDOSCOPY      Current Outpatient Medications  Medication Sig Dispense Refill  . amLODipine (NORVASC) 10 MG tablet TAKE ONE TABLET BY MOUTH EVERY DAY 30 tablet 3  . benazepril (LOTENSIN) 40 MG tablet TAKE 1 TABLET BY MOUTH ONCE DAILY 30 tablet 3  . Cholecalciferol (VITAMIN D-3) 1000 units CAPS Take 2,000 Units by mouth.    . Garlic 123XX123 MG CAPS Take 1,000 mg by mouth daily.    . pantoprazole (PROTONIX) 40 MG tablet TAKE 1 TABLET BY MOUTH TWICE DAILY 60 tablet 3  . potassium  chloride (KLOR-CON) 10 MEQ tablet Take 1 tablet (10 mEq total) by mouth daily. 90 tablet 0  . Probiotic Product (DIGESTIVE ADVANTAGE PO) Take by mouth 2 (two) times daily.     . propranolol (INDERAL) 20 MG tablet Take 20 mg by mouth daily.     . rosuvastatin (CRESTOR) 10 MG tablet TAKE 1 TABLET BY MOUTH ONCE DAILY 30 tablet 3  . tadalafil (CIALIS) 5 MG tablet Take 1 tablet (5 mg total) by mouth daily. 30 tablet 3  . warfarin (COUMADIN) 5 MG tablet Take 1 tablet daily except 1/2 tablet on Wednesdays (Patient taking differently: Take 1 tablet daily except 1/2 tablet on Sunday) 100 tablet 3  . Wheat Dextrin (BENEFIBER DRINK  MIX PO) Take by mouth.    . furosemide (LASIX) 20 MG tablet Take 20 mg daily as needed for leg swelling 90 tablet 3   No current facility-administered medications for this visit.   Allergies:  Latex   ROS:  Hearing loss.  Lower leg swelling.  Reported right inguinal hernia, intermittently painful.  He plans to have this reevaluated by Dr. Arnoldo Morale.  Physical Exam: VS:  BP 124/63   Pulse 68   Temp 98.1 F (36.7 C)   Ht 5\' 9"  (1.753 m)   Wt 232 lb (105.2 kg)   SpO2 98%   BMI 34.26 kg/m , BMI Body mass index is 34.26 kg/m.  Wt Readings from Last 3 Encounters:  03/10/19 232 lb (105.2 kg)  03/05/19 222 lb (100.7 kg)  02/10/19 227 lb (103 kg)    General: Elderly male, appears comfortable at rest. HEENT: Conjunctiva and lids normal, wearing a mask. Neck: Supple, no elevated JVP, right carotid bruit, no thyromegaly. Lungs: Clear to auscultation, nonlabored breathing at rest. Cardiac: Irregularly irregular, no S3, [id number] systolic murmur, no pericardial rub. Abdomen: Soft, nontender, bowel sounds present. Extremities: 2+ lower leg edema, distal pulses 2+. Skin: Warm and dry. Musculoskeletal: No kyphosis. Neuropsychiatric: Alert and oriented x3, affect grossly appropriate.  ECG:  An ECG dated 01/11/2018 was personally reviewed today and demonstrated:  Atrial fibrillation with low voltage and nonspecific T wave changes.  Recent Labwork: 03/05/2019: ALT 12; AST 18; BUN 16; Creatinine, Ser 0.72; Hemoglobin 12.6; Platelets 182; Potassium 3.9; Sodium 137   Other Studies Reviewed Today:  Echocardiogram 09/16/2014(Dr. Gosrani office): LVH with preserved LVEF66%, ungraded diastolic dysfunction, no significant valvular abnormalities.  Assessment and Plan:  1.  Permanent atrial fibrillation.  CHA2DS2-VASc score is at least 3.  He remains on Coumadin for stroke prophylaxis.  No reported major bleeding issues.  He did have a recent spontaneous subconjunctival hemorrhage involving the left  eye.  I did talk with him about DOAC's in case we need to switch eventually.  Heart rate control is adequate, he is only on Inderal at low-dose.  2.  Bilateral carotid artery disease, right greater than left.  He is overdue for follow-up carotid Dopplers, these are being arranged.  He saw Dr. Scot Dock in 2019.  He is on statin therapy, not on aspirin given use of Coumadin.  3.  Abdominal aortic aneurysm, 3.5 x 3.5 cm as of 2018.  Follow-up abdominal ultrasound will be obtained as well.  He is asymptomatic.  4.  Essential hypertension, blood pressure is well controlled today.  Continue Norvasc and Lotensin.  5.  Bilateral leg swelling.  Lasix 20 mg dose prescribed for as needed use.  Follow-up BMET in 10 days.  We will also obtain a follow-up echocardiogram to ensure stability in  cardiac structure and function.  Medication Adjustments/Labs and Tests Ordered: Current medicines are reviewed at length with the patient today.  Concerns regarding medicines are outlined above.   Tests Ordered: Orders Placed This Encounter  Procedures  . US Abdomen Complete  . Basic Metabolic Panel (BMET)  . EKG 12-Lead  . ECHOCARDIOGRAM COMPLETE  . VAS US CAROTID    Medication Changes: Meds ordered this encounter  Medications  . furosemide (LASIX) 20 MG tablet    Sig: Take 20 mg daily as needed for leg swelling    Dispense:  90 tablet    Refill:  3    Disposition:  Follow up 6 months in the [place] office.  Signed, Satira Sark, MD, [facility] 03/10/2019 1:51 PM    [place] [facility] [address], [place],  [address] Phone: [phone]; Fax: [phone]

## 2019-03-10 NOTE — Patient Instructions (Signed)
Medication Instructions:  Take Lasix 20 mg daily as needed for leg swelling   *If you need a refill on your cardiac medications before your next appointment, please call your pharmacy*  Lab Work: BMET in 10 days after starting lasix  If you have labs (blood work) drawn today and your tests are completely normal, you will receive your results only by: Marland Kitchen MyChart Message (if you have MyChart) OR . A paper copy in the mail If you have any lab test that is abnormal or we need to change your treatment, we will call you to review the results.  Testing/Procedures: Your physician has requested that you have an echocardiogram. Echocardiography is a painless test that uses sound waves to create images of your heart. It provides your doctor with information about the size and shape of your heart and how well your heart's chambers and valves are working. This procedure takes approximately one hour. There are no restrictions for this procedure.  Your physician has requested that you have a carotid duplex. This test is an ultrasound of the carotid arteries in your neck. It looks at blood flow through these arteries that supply the brain with blood. Allow one hour for this exam. There are no restrictions or special instructions.   Your physician has requested that you have an abdominal aorta duplex. During this test, an ultrasound is used to evaluate the aorta. Allow 30 minutes for this exam. Do not eat after midnight the day before and avoid carbonated beverages  Follow-Up: At [facility], you and your health needs are our priority.  As part of our continuing mission to provide you with exceptional heart care, we have created designated Provider Care Teams.  These Care Teams include your primary Cardiologist (physician) and Advanced Practice Providers (APPs -  Physician Assistants and Nurse Practitioners) who all work together to provide you with the care you need, when you need it.  Your next  appointment:   6 month(s)  The format for your next appointment:   In Person  Provider:   [name], MD  Other Instructions None    Thank you for choosing [facility] !

## 2019-03-24 ENCOUNTER — Telehealth: Payer: Self-pay | Admitting: Cardiology

## 2019-03-24 ENCOUNTER — Other Ambulatory Visit: Payer: Self-pay

## 2019-03-24 ENCOUNTER — Other Ambulatory Visit (HOSPITAL_COMMUNITY)
Admission: RE | Admit: 2019-03-24 | Discharge: 2019-03-24 | Disposition: A | Discharge status: Home / Self Care | Payer: Medicare PPO | Source: Ambulatory Visit | Attending: Cardiology | Admitting: Cardiology

## 2019-03-24 DIAGNOSIS — Z79899 Other long term (current) drug therapy: Secondary | ICD-10-CM | POA: Insufficient documentation

## 2019-03-24 DIAGNOSIS — I4821 Permanent atrial fibrillation: Secondary | ICD-10-CM | POA: Diagnosis not present

## 2019-03-24 LAB — BASIC METABOLIC PANEL
Anion gap: 8 (ref 5–15)
BUN: 25 mg/dL — ABNORMAL HIGH (ref 8–23)
CO2: 30 mmol/L (ref 22–32)
Calcium: 9.3 mg/dL (ref 8.9–10.3)
Chloride: 101 mmol/L (ref 98–111)
Creatinine, Ser: 0.96 mg/dL (ref 0.61–1.24)
GFR calc Af Amer: >60 mL/min (ref >60)
GFR calc non Af Amer: >60 mL/min (ref >60)
Glucose, Bld: 105 mg/dL — ABNORMAL HIGH (ref 70–99)
Potassium: 4.1 mmol/L (ref 3.5–5.1)
Sodium: 139 mmol/L (ref 135–145)

## 2019-03-24 NOTE — Telephone Encounter (Signed)
Called pt. Spoke with pt's son. Informed him of lab results.

## 2019-03-24 NOTE — Telephone Encounter (Signed)
Returning call for results-- [phone]

## 2019-03-30 ENCOUNTER — Other Ambulatory Visit (INDEPENDENT_AMBULATORY_CARE_PROVIDER_SITE_OTHER): Payer: Self-pay | Admitting: Internal Medicine

## 2019-04-02 ENCOUNTER — Telehealth: Payer: Self-pay

## 2019-04-02 ENCOUNTER — Ambulatory Visit (INDEPENDENT_AMBULATORY_CARE_PROVIDER_SITE_OTHER): Payer: Medicare PPO

## 2019-04-02 ENCOUNTER — Ambulatory Visit (INDEPENDENT_AMBULATORY_CARE_PROVIDER_SITE_OTHER): Payer: Medicare PPO | Admitting: *Deleted

## 2019-04-02 ENCOUNTER — Ambulatory Visit (HOSPITAL_COMMUNITY)
Admission: RE | Admit: 2019-04-02 | Discharge: 2019-04-02 | Disposition: A | Discharge status: Home / Self Care | Payer: Medicare PPO | Source: Ambulatory Visit | Attending: Cardiology | Admitting: Cardiology

## 2019-04-02 ENCOUNTER — Other Ambulatory Visit: Payer: Self-pay

## 2019-04-02 DIAGNOSIS — I6523 Occlusion and stenosis of bilateral carotid arteries: Secondary | ICD-10-CM

## 2019-04-02 DIAGNOSIS — I714 Abdominal aortic aneurysm, without rupture, unspecified: Secondary | ICD-10-CM

## 2019-04-02 DIAGNOSIS — N281 Cyst of kidney, acquired: Secondary | ICD-10-CM | POA: Diagnosis not present

## 2019-04-02 DIAGNOSIS — I719 Aortic aneurysm of unspecified site, without rupture: Secondary | ICD-10-CM | POA: Diagnosis not present

## 2019-04-02 DIAGNOSIS — K769 Liver disease, unspecified: Secondary | ICD-10-CM | POA: Diagnosis not present

## 2019-04-02 DIAGNOSIS — Z8679 Personal history of other diseases of the circulatory system: Secondary | ICD-10-CM

## 2019-04-02 DIAGNOSIS — I482 Chronic atrial fibrillation, unspecified: Secondary | ICD-10-CM | POA: Diagnosis not present

## 2019-04-02 DIAGNOSIS — K7689 Other specified diseases of liver: Secondary | ICD-10-CM | POA: Diagnosis not present

## 2019-04-02 DIAGNOSIS — I4821 Permanent atrial fibrillation: Secondary | ICD-10-CM | POA: Diagnosis not present

## 2019-04-02 DIAGNOSIS — Z5181 Encounter for therapeutic drug level monitoring: Secondary | ICD-10-CM

## 2019-04-02 LAB — POCT INR: INR: 2.7 (ref 2.0–3.0)

## 2019-04-02 NOTE — Patient Instructions (Signed)
Continue taking 1 tablet daily.  Re-check in 6 weeks. ?

## 2019-04-02 NOTE — Telephone Encounter (Signed)
-----   Message from Satira Sark, MD sent at 04/02/2019  2:48 PM EST ----- Results reviewed.  Aortic ultrasound indicates distal aorta measuring 5 cm in diameter compared to 3.5 cm on previous examination.  Somewhat confusing in light of concurrently reported abdominal ultrasound that indicated abdominal aorta at 3.5 cm.  Patient has seen Dr. Scot Dock with [facility], please schedule a follow-up visit.

## 2019-04-02 NOTE — Telephone Encounter (Signed)
Ref placed to [facility] to fu with dr.Dickson, lm for pt to call back

## 2019-04-07 NOTE — Telephone Encounter (Signed)
I spoke with son, POA, he will call Dr.Dickson for f/u apt

## 2019-04-21 ENCOUNTER — Encounter (INDEPENDENT_AMBULATORY_CARE_PROVIDER_SITE_OTHER): Payer: Self-pay | Admitting: Nurse Practitioner

## 2019-04-21 ENCOUNTER — Ambulatory Visit (INDEPENDENT_AMBULATORY_CARE_PROVIDER_SITE_OTHER): Payer: Medicare PPO | Admitting: Nurse Practitioner

## 2019-04-21 ENCOUNTER — Other Ambulatory Visit: Payer: Self-pay

## 2019-04-21 VITALS — BP 140/78 | HR 64 | Temp 97.5°F | Ht 69.0 in | Wt 235.0 lb

## 2019-04-21 DIAGNOSIS — I714 Abdominal aortic aneurysm, without rupture, unspecified: Secondary | ICD-10-CM

## 2019-04-21 DIAGNOSIS — J449 Chronic obstructive pulmonary disease, unspecified: Secondary | ICD-10-CM | POA: Diagnosis not present

## 2019-04-21 DIAGNOSIS — K469 Unspecified abdominal hernia without obstruction or gangrene: Secondary | ICD-10-CM | POA: Diagnosis not present

## 2019-04-21 DIAGNOSIS — K769 Liver disease, unspecified: Secondary | ICD-10-CM

## 2019-04-21 DIAGNOSIS — I10 Essential (primary) hypertension: Secondary | ICD-10-CM

## 2019-04-21 NOTE — Progress Notes (Signed)
Subjective:  Patient ID: Tristan Lopez, male    DOB: 01/08/1931  Age: 86 y.o. MRN: [id number]  CC:  Chief Complaint  Patient presents with  . Hypertension  . Follow-up    Abdominal aortic aneurysm, liver lesions, COPD      HPI  Patient arrives today accompanied by his son for follow-up of the above.  Abdominal aortic aneurysm/liver lesions: He underwent ultrasound of his abdomen earlier this month.  Which did show abdominal aortic aneurysm seems to have grown and complex lesions in his liver were also noted.  Per chart review I see that he has had a history of benign liver cyst back in 2013.  Radiologist recommendation is for patient to have CT scan of abdomen for further evaluation of liver lesions.  Per his son the patient follows up with Dr. Doren Custard his vascular surgeon, for further evaluation of the AAA.  They tell me that he is scheduled to have a CT scan of his abdomen in May for further evaluation.  Hypertension: He has a history of hypertension and continues on amlodipine, benazepril, and propanolol.  He is tolerating these medications well.  He does monitor his blood pressure periodically at home.  COPD: He has a history of COPD, but is not on any medication currently.  He denies any exacerbations within the last year.  He tells me that his breathing is stable.  He does report intermittent coughing and wheezing, and some reduced tolerance to exercise, but over well feels that his breathing is well controlled and is happy with his symptom management.  He is up-to-date with pneumonia and influenza vaccinations.  He also mentions to me that he has a history of abdominal hernias.  He tells me that the hernia on the right side is causing some discomfort intermittently.  He denies any severe pain currently, and tells me that he is always able to manually reduce the hernia when he notices it.  He does state it seems to bother him a little bit more often when he is standing for long  periods of time.  He is interested in possibly seeing a surgeon to discuss surgical treatment of his hernia.  Per chart review I see that he saw Dr. Arnoldo Morale back in 2019, at which time it was recommended that surgery be held off due to patient's multiple comorbidities which would increase his risk of surgery.  Today, his symptoms seem to continue to be fairly mild in nature.   Past Medical History:  Diagnosis Date  . Abdominal aortic aneurysm without rupture ([place])   . Bilateral renal cysts   . BPH (benign prostatic hyperplasia)   . Chronic atrial fibrillation ([place])   . COPD (chronic obstructive pulmonary disease) ([place])   . Diverticulosis    Pancolonic  . Essential hypertension   . Hemorrhoids   . Hiatal hernia   . Hypercholesteremia   . Liver cyst    Benign by liver biopsy September 2013  . Nephrolithiasis   . Pre-diabetes   . Reflux esophagitis   . Sleep apnea    Uses CPAP  . Tubular adenoma       Family History  Problem Relation Age of Onset  . Breast cancer Mother   . Other Mother        Essential tremor  . Stroke Mother   . Heart disease Father        Pacemaker  . Heart attack Maternal Grandfather   . Colon cancer Neg Hx   .  Liver disease Neg Hx     Social History   Social History Narrative  . Not on file   Social History   Tobacco Use  . Smoking status: Former Smoker    Packs/day: 0.50    Types: Cigarettes    Quit date: 1992    Years since quitting: 29.2  . Smokeless tobacco: Never Used  Substance Use Topics  . Alcohol use: No     Current Meds  Medication Sig  . benazepril (LOTENSIN) 40 MG tablet TAKE 1 TABLET BY MOUTH ONCE DAILY  . Cholecalciferol (VITAMIN D-3) 1000 units CAPS Take 2,000 Units by mouth.  . furosemide (LASIX) 20 MG tablet Take 20 mg daily as needed for leg swelling  . Garlic 123XX123 MG CAPS Take 1,000 mg by mouth daily.  Marland Kitchen oxybutynin (DITROPAN-XL) 10 MG 24 hr tablet TAKE 1 TABLET BY MOUTH ONCE DAILY  . pantoprazole (PROTONIX) 40 MG  tablet TAKE 1 TABLET BY MOUTH TWICE DAILY  . potassium chloride (KLOR-CON) 10 MEQ tablet Take 1 tablet (10 mEq total) by mouth daily.  . Probiotic Product (DIGESTIVE ADVANTAGE PO) Take by mouth 2 (two) times daily.   . propranolol (INDERAL) 20 MG tablet Take 20 mg by mouth daily.   . rosuvastatin (CRESTOR) 10 MG tablet TAKE 1 TABLET BY MOUTH ONCE DAILY  . warfarin (COUMADIN) 5 MG tablet TAKE ONE TABLET BY MOUTH EVERY DAY    ROS:  Review of Systems  Eyes: Negative for blurred vision and double vision.  Respiratory: Positive for shortness of breath and wheezing. Negative for cough.   Cardiovascular: Negative for chest pain and palpitations.  Neurological: Negative for dizziness and headaches.     Objective:   Today's Vitals: BP 140/78 (BP Location: Left Arm, Patient Position: Sitting, Cuff Size: Normal)   Pulse 64   Temp (!) 97.5 F (36.4 C) (Temporal)   Ht 5\' 9"  (1.753 m)   Wt 235 lb (106.6 kg)   SpO2 98%   BMI 34.70 kg/m  Vitals with BMI 04/21/2019 03/10/2019 03/05/2019  Height 5\' 9"  5\' 9"  5\' 9"   Weight 235 lbs 232 lbs 222 lbs  BMI 34.69 [id number] [id number]  Systolic XX123456 [id number] -  Diastolic 78 63 -  Pulse 64 68 -     Physical Exam Vitals reviewed.  Constitutional:      Appearance: Normal appearance.  HENT:     Head: Normocephalic and atraumatic.  Cardiovascular:     Rate and Rhythm: Normal rate and regular rhythm.  Pulmonary:     Effort: Pulmonary effort is normal.     Breath sounds: Normal breath sounds.  Musculoskeletal:     Cervical back: Neck supple.  Skin:    General: Skin is warm and dry.  Neurological:     Mental Status: He is alert and oriented to person, place, and time.  Psychiatric:        Mood and Affect: Mood normal.        Behavior: Behavior normal.        Thought Content: Thought content normal.        Judgment: Judgment normal.          Assessment and Plan   1. Essential hypertension   2. AAA (abdominal aortic aneurysm) without rupture ([place])     3. Chronic obstructive pulmonary disease, unspecified COPD type ([place])   4. Liver lesion   5. Hernia of abdominal cavity      Plan: 1.  Blood pressure is acceptable to me today.  I want to hold off on increasing any medications as he is at high risk for falls and other complications of hypotension.  For now he will continue on his current medication regimen.  2., 4.  He was encouraged to follow-up with his vascular surgeon as scheduled.  He tells me he understands.  3.  Appears to be stable and manageable at this time.  Will not make any changes to his medication regimen at this time, encouraged him to continue keeping his vaccinations up-to-date.  5.  We had a discussion regarding possibly going back to see the surgeon to discuss options, however he probably is at higher risk for complications if he were to decide to undergo surgery.  I recommended he try getting an abdominal girdle from a medical supply store and wear this when he knows he will be on his feet for prolonged periods of time to see if this helps with his symptoms.  I did tell him if this does not help with his symptoms or if he starts to experience severe pain that he should notify us to be referred to surgery.  He tells me he understands.    Tests ordered No orders of the defined types were placed in this encounter.     No orders of the defined types were placed in this encounter.   Patient to follow-up in 3 months or sooner as needed.  Ailene Ards, NP

## 2019-04-22 DIAGNOSIS — I272 Pulmonary hypertension, unspecified: Secondary | ICD-10-CM | POA: Diagnosis not present

## 2019-04-22 DIAGNOSIS — H269 Unspecified cataract: Secondary | ICD-10-CM | POA: Diagnosis not present

## 2019-04-22 DIAGNOSIS — D6869 Other thrombophilia: Secondary | ICD-10-CM | POA: Diagnosis not present

## 2019-04-22 DIAGNOSIS — Z8249 Family history of ischemic heart disease and other diseases of the circulatory system: Secondary | ICD-10-CM | POA: Diagnosis not present

## 2019-04-22 DIAGNOSIS — G4730 Sleep apnea, unspecified: Secondary | ICD-10-CM | POA: Diagnosis not present

## 2019-04-22 DIAGNOSIS — Z803 Family history of malignant neoplasm of breast: Secondary | ICD-10-CM | POA: Diagnosis not present

## 2019-04-22 DIAGNOSIS — G8929 Other chronic pain: Secondary | ICD-10-CM | POA: Diagnosis not present

## 2019-04-22 DIAGNOSIS — E785 Hyperlipidemia, unspecified: Secondary | ICD-10-CM | POA: Diagnosis not present

## 2019-04-22 DIAGNOSIS — Z85828 Personal history of other malignant neoplasm of skin: Secondary | ICD-10-CM | POA: Diagnosis not present

## 2019-04-22 DIAGNOSIS — Z91040 Latex allergy status: Secondary | ICD-10-CM | POA: Diagnosis not present

## 2019-04-22 DIAGNOSIS — Z7901 Long term (current) use of anticoagulants: Secondary | ICD-10-CM | POA: Diagnosis not present

## 2019-04-22 DIAGNOSIS — Z87891 Personal history of nicotine dependence: Secondary | ICD-10-CM | POA: Diagnosis not present

## 2019-04-22 DIAGNOSIS — G3184 Mild cognitive impairment, so stated: Secondary | ICD-10-CM | POA: Diagnosis not present

## 2019-04-22 DIAGNOSIS — Z7722 Contact with and (suspected) exposure to environmental tobacco smoke (acute) (chronic): Secondary | ICD-10-CM | POA: Diagnosis not present

## 2019-04-22 DIAGNOSIS — R32 Unspecified urinary incontinence: Secondary | ICD-10-CM | POA: Diagnosis not present

## 2019-04-22 DIAGNOSIS — M1990 Unspecified osteoarthritis, unspecified site: Secondary | ICD-10-CM | POA: Diagnosis not present

## 2019-04-22 DIAGNOSIS — I4891 Unspecified atrial fibrillation: Secondary | ICD-10-CM | POA: Diagnosis not present

## 2019-04-24 ENCOUNTER — Other Ambulatory Visit (INDEPENDENT_AMBULATORY_CARE_PROVIDER_SITE_OTHER): Payer: Self-pay | Admitting: Internal Medicine

## 2019-05-14 ENCOUNTER — Other Ambulatory Visit: Payer: Self-pay

## 2019-05-14 ENCOUNTER — Ambulatory Visit (INDEPENDENT_AMBULATORY_CARE_PROVIDER_SITE_OTHER): Payer: Medicare PPO | Admitting: *Deleted

## 2019-05-14 DIAGNOSIS — Z5181 Encounter for therapeutic drug level monitoring: Secondary | ICD-10-CM

## 2019-05-14 DIAGNOSIS — I482 Chronic atrial fibrillation, unspecified: Secondary | ICD-10-CM | POA: Diagnosis not present

## 2019-05-14 LAB — POCT INR: INR: 2.3 (ref 2.0–3.0)

## 2019-05-14 NOTE — Patient Instructions (Signed)
Continue taking 1 tablet daily.  Re-check in 6 weeks. ?

## 2019-05-21 ENCOUNTER — Other Ambulatory Visit: Payer: Self-pay | Admitting: *Deleted

## 2019-05-21 DIAGNOSIS — I714 Abdominal aortic aneurysm, without rupture, unspecified: Secondary | ICD-10-CM

## 2019-05-21 DIAGNOSIS — I6529 Occlusion and stenosis of unspecified carotid artery: Secondary | ICD-10-CM

## 2019-05-26 ENCOUNTER — Telehealth (HOSPITAL_COMMUNITY): Payer: Self-pay

## 2019-05-26 NOTE — Telephone Encounter (Signed)
The above patient or their representative was contacted and gave the following answers to these questions:         Do you have any of the following symptoms?    NO  Fever                    Cough                   Shortness of breath  Do  you have any of the following other symptoms?    muscle pain         vomiting,        diarrhea        rash         weakness        red eye        abdominal pain         bruising          bruising or bleeding              joint pain           severe headache    Have you been in contact with someone who was or has been sick in the past 2 weeks?  NO  Yes                 Unsure                         Unable to assess   Does the person that you were in contact with have any of the following symptoms?   Cough         shortness of breath           muscle pain         vomiting,            diarrhea            rash            weakness           fever            red eye           abdominal pain           bruising  or  bleeding                joint pain                severe headache                 COMMENTS OR ACTION PLAN FOR THIS PATIENT:        Patient is fully vaccinated/[facility]

## 2019-05-27 ENCOUNTER — Ambulatory Visit (HOSPITAL_COMMUNITY)
Admission: RE | Admit: 2019-05-27 | Discharge: 2019-05-27 | Disposition: A | Discharge status: Home / Self Care | Payer: Medicare PPO | Source: Ambulatory Visit | Attending: Surgery | Admitting: Surgery

## 2019-05-27 ENCOUNTER — Encounter: Payer: Self-pay | Admitting: Vascular Surgery

## 2019-05-27 ENCOUNTER — Ambulatory Visit: Payer: Medicare PPO | Admitting: Vascular Surgery

## 2019-05-27 ENCOUNTER — Other Ambulatory Visit: Payer: Self-pay

## 2019-05-27 VITALS — BP 166/78 | HR 60 | Temp 98.1°F | Resp 20 | Ht 69.0 in | Wt 236.0 lb

## 2019-05-27 DIAGNOSIS — I6523 Occlusion and stenosis of bilateral carotid arteries: Secondary | ICD-10-CM | POA: Diagnosis not present

## 2019-05-27 DIAGNOSIS — I6529 Occlusion and stenosis of unspecified carotid artery: Secondary | ICD-10-CM

## 2019-05-27 DIAGNOSIS — I714 Abdominal aortic aneurysm, without rupture, unspecified: Secondary | ICD-10-CM

## 2019-05-27 NOTE — Progress Notes (Signed)
MEDICAL ISSUES:   ABDOMINAL AORTIC ANEURYSM: This patient has a 3.7 cm infrarenal abdominal aortic aneurysm.  This has enlarged only 2 mm since 2019.  I think it is very unlikely that this will have to be addressed given his age.  I would continue with his follow-up ultrasounds at 2-year intervals.  This is being managed by Dr. Myles Gip office.  CAROTID DISEASE: The patient has a 60 to 79% right carotid stenosis in the lower end of that range.  He also has a 40 to 59% left carotid stenosis.  He is asymptomatic.  I have explained that we would normally consider carotid endarterectomy in a normal risk patient at the stenosis progressed to greater than 80% or he developed focal neurologic symptoms.  He is on chronic Coumadin therapy and I have recommended he take 81 mg of aspirin daily given the carotid disease.  He is also on a statin.  I would continue his carotid duplex scans at 26-month intervals.  Again this is more convenient for him to arrange closer to home through Dr. Myles Gip office.  I will be happy to see him back at any time if his carotid stenosis progresses or becomes symptomatic.   REASON FOR VISIT:   Abdominal aortic aneurysm and carotid disease.  The referral is from Dr. [name].  HPI:   Tristan Lopez is a pleasant 86 y.o. male who I saw in consultation with a right carotid stenosis on 04/03/2017.  At that time the patient had a 60 to 79% right carotid stenosis and a 40 to 59% left carotid stenosis.  He was asymptomatic.  I explained that we would not consider carotid endarterectomy unless the stenosis progressed to greater than 80% or he develop new neurologic symptoms.  He was getting continuous follow-up in Dr. Myles Gip office for convenience.  I recommended beginning 81 mg of aspirin daily.  The patient had a known very small abdominal aortic aneurysm and I explained we would not consider elective repair unless this reached 5.5 cm in maximum diameter.  This was  being followed at 2-year intervals.  Since I saw him last, he denies any history of stroke, TIAs, expressive or receptive aphasia, or amaurosis fugax.  He does have some occasional paresthesias in his left arm which she attributes to certain positions.  He did fall and has some right lower back pain.  He denies any abdominal pain or sudden onset abdominal or back pain.  His blood pressures been under good control.  He is not a smoker.  Past Medical History:  Diagnosis Date  . Abdominal aortic aneurysm without rupture ([place])   . Bilateral renal cysts   . BPH (benign prostatic hyperplasia)   . Carotid artery occlusion   . Chronic atrial fibrillation ([place])   . COPD (chronic obstructive pulmonary disease) ([place])   . Diverticulosis    Pancolonic  . Essential hypertension   . Hemorrhoids   . Hiatal hernia   . Hypercholesteremia   . Liver cyst    Benign by liver biopsy September 2013  . Nephrolithiasis   . Pre-diabetes   . Reflux esophagitis   . Sleep apnea    Uses CPAP  . Tubular adenoma     Family History  Problem Relation Age of Onset  . Breast cancer Mother   . Other Mother        Essential tremor  . Stroke Mother   . Heart disease Father        Pacemaker  .  Heart attack Maternal Grandfather   . Colon cancer Neg Hx   . Liver disease Neg Hx     SOCIAL HISTORY: Social History   Tobacco Use  . Smoking status: Former Smoker    Packs/day: 0.50    Types: Cigarettes    Quit date: 1992    Years since quitting: 29.3  . Smokeless tobacco: Never Used  Substance Use Topics  . Alcohol use: No    Allergies  Allergen Reactions  . Latex Rash    Current Outpatient Medications  Medication Sig Dispense Refill  . Cholecalciferol (VITAMIN D-3) 1000 units CAPS Take 2,000 Units by mouth.    . furosemide (LASIX) 20 MG tablet Take 20 mg daily as needed for leg swelling 90 tablet 3  . Garlic 123XX123 MG CAPS Take 1,000 mg by mouth daily.    . potassium chloride (KLOR-CON) 10 MEQ  tablet Take 1 tablet (10 mEq total) by mouth daily. 90 tablet 0  . Probiotic Product (DIGESTIVE ADVANTAGE PO) Take by mouth 2 (two) times daily.     . propranolol (INDERAL) 20 MG tablet Take 20 mg by mouth daily.     . rosuvastatin (CRESTOR) 10 MG tablet TAKE 1 TABLET BY MOUTH ONCE DAILY 31 tablet 3  . warfarin (COUMADIN) 5 MG tablet TAKE ONE TABLET BY MOUTH EVERY DAY 31 tablet 2  . amLODipine (NORVASC) 10 MG tablet TAKE ONE TABLET BY MOUTH EVERY DAY 30 tablet 5  . benazepril (LOTENSIN) 40 MG tablet TAKE 1 TABLET BY MOUTH ONCE DAILY 31 tablet 3  . pantoprazole (PROTONIX) 40 MG tablet TAKE 1 TABLET BY MOUTH TWICE DAILY 62 tablet 3  . tadalafil (CIALIS) 5 MG tablet Take 1 tablet (5 mg total) by mouth daily. 30 tablet 3   No current facility-administered medications for this visit.    REVIEW OF SYSTEMS:  [X]  denotes positive finding, [ ]  denotes negative finding Cardiac  Comments:  Chest pain or chest pressure:    Shortness of breath upon exertion: x   Short of breath when lying flat:    Irregular heart rhythm:        Vascular    Pain in calf, thigh, or hip brought on by ambulation:    Pain in feet at night that wakes you up from your sleep:     Blood clot in your veins:    Leg swelling:         Pulmonary    Oxygen at home:    Productive cough:     Wheezing:         Neurologic    Sudden weakness in arms or legs:     Sudden numbness in arms or legs:     Sudden onset of difficulty speaking or slurred speech:    Temporary loss of vision in one eye:     Problems with dizziness:         Gastrointestinal    Blood in stool:     Vomited blood:         Genitourinary    Burning when urinating:     Blood in urine:        Psychiatric    Major depression:         Hematologic    Bleeding problems:    Problems with blood clotting too easily:        Skin    Rashes or ulcers:        Constitutional    Fever or chills:  PHYSICAL EXAM:   Vitals:   05/27/19 1413 05/27/19  1415  BP: (!) 153/81 (!) 166/78  Pulse: 60   Resp: 20   Temp: 98.1 F (36.7 C)   SpO2: 96%   Weight: 236 lb (107 kg)   Height: 5\' 9"  (1.753 m)     GENERAL: The patient is a well-nourished male, in no acute distress. The vital signs are documented above. CARDIAC: There is a regular rate and rhythm.  VASCULAR: I do not detect carotid bruits. He has palpable femoral pulses and palpable pedal pulses bilaterally. PULMONARY: There is good air exchange bilaterally without wheezing or rales. ABDOMEN: Soft and non-tender with normal pitched bowel sounds.  I cannot palpate his aneurysm because of his size.  MUSCULOSKELETAL: There are no major deformities or cyanosis. NEUROLOGIC: No focal weakness or paresthesias are detected. SKIN: There are no ulcers or rashes noted. PSYCHIATRIC: The patient has a normal affect.  DATA:    CAROTID DUPLEX: I reviewed his carotid duplex scan that was done on 04/02/2019.  On the right side he had a 60 to 79% carotid stenosis.  The peak systolic velocity was XX123456 cm/s with an end-diastolic velocity of 60.  Thus this was in the lower end of that range.  On the left side he had a 40 to 59% carotid stenosis.  Both vertebral arteries were patent with antegrade flow.  DUPLEX ABDOMINAL AORTA: He also had a duplex of his abdominal aortic aneurysm on 04/02/2019.  The maximum diameter was noted to be 3.7 cm.  This had not changed significantly.  2-year follow-up was recommended.   Deitra Mayo [facility] [phone]

## 2019-06-01 ENCOUNTER — Other Ambulatory Visit (INDEPENDENT_AMBULATORY_CARE_PROVIDER_SITE_OTHER): Payer: Self-pay | Admitting: Internal Medicine

## 2019-06-24 ENCOUNTER — Ambulatory Visit (INDEPENDENT_AMBULATORY_CARE_PROVIDER_SITE_OTHER): Payer: Medicare PPO | Admitting: *Deleted

## 2019-06-24 ENCOUNTER — Other Ambulatory Visit: Payer: Self-pay

## 2019-06-24 DIAGNOSIS — I482 Chronic atrial fibrillation, unspecified: Secondary | ICD-10-CM

## 2019-06-24 DIAGNOSIS — Z5181 Encounter for therapeutic drug level monitoring: Secondary | ICD-10-CM

## 2019-06-24 LAB — POCT INR: INR: 2.8 (ref 2.0–3.0)

## 2019-06-24 NOTE — Patient Instructions (Signed)
Continue taking 1 tablet daily.  Re-check in 6 weeks. ?

## 2019-07-15 ENCOUNTER — Other Ambulatory Visit (INDEPENDENT_AMBULATORY_CARE_PROVIDER_SITE_OTHER): Payer: Self-pay | Admitting: Nurse Practitioner

## 2019-07-15 DIAGNOSIS — I10 Essential (primary) hypertension: Secondary | ICD-10-CM

## 2019-07-28 ENCOUNTER — Ambulatory Visit (INDEPENDENT_AMBULATORY_CARE_PROVIDER_SITE_OTHER): Payer: Medicare PPO | Admitting: Nurse Practitioner

## 2019-07-28 ENCOUNTER — Other Ambulatory Visit: Payer: Self-pay

## 2019-07-28 ENCOUNTER — Telehealth (INDEPENDENT_AMBULATORY_CARE_PROVIDER_SITE_OTHER): Payer: Self-pay | Admitting: Nurse Practitioner

## 2019-07-28 ENCOUNTER — Encounter (INDEPENDENT_AMBULATORY_CARE_PROVIDER_SITE_OTHER): Payer: Self-pay | Admitting: Nurse Practitioner

## 2019-07-28 ENCOUNTER — Telehealth (INDEPENDENT_AMBULATORY_CARE_PROVIDER_SITE_OTHER): Payer: Self-pay

## 2019-07-28 ENCOUNTER — Ambulatory Visit (HOSPITAL_COMMUNITY)
Admission: RE | Admit: 2019-07-28 | Discharge: 2019-07-28 | Disposition: A | Discharge status: Home / Self Care | Payer: Medicare PPO | Source: Ambulatory Visit | Attending: Nurse Practitioner | Admitting: Nurse Practitioner

## 2019-07-28 VITALS — BP 140/70 | HR 51 | Temp 97.9°F | Ht 69.0 in | Wt 239.8 lb

## 2019-07-28 DIAGNOSIS — R05 Cough: Secondary | ICD-10-CM | POA: Diagnosis not present

## 2019-07-28 DIAGNOSIS — K469 Unspecified abdominal hernia without obstruction or gangrene: Secondary | ICD-10-CM | POA: Diagnosis not present

## 2019-07-28 DIAGNOSIS — R059 Cough, unspecified: Secondary | ICD-10-CM

## 2019-07-28 DIAGNOSIS — E559 Vitamin D deficiency, unspecified: Secondary | ICD-10-CM | POA: Diagnosis not present

## 2019-07-28 DIAGNOSIS — E785 Hyperlipidemia, unspecified: Secondary | ICD-10-CM | POA: Diagnosis not present

## 2019-07-28 DIAGNOSIS — K769 Liver disease, unspecified: Secondary | ICD-10-CM

## 2019-07-28 DIAGNOSIS — R21 Rash and other nonspecific skin eruption: Secondary | ICD-10-CM | POA: Diagnosis not present

## 2019-07-28 DIAGNOSIS — J90 Pleural effusion, not elsewhere classified: Secondary | ICD-10-CM | POA: Diagnosis not present

## 2019-07-28 DIAGNOSIS — I10 Essential (primary) hypertension: Secondary | ICD-10-CM | POA: Diagnosis not present

## 2019-07-28 DIAGNOSIS — I517 Cardiomegaly: Secondary | ICD-10-CM | POA: Diagnosis not present

## 2019-07-28 DIAGNOSIS — J9811 Atelectasis: Secondary | ICD-10-CM | POA: Diagnosis not present

## 2019-07-28 NOTE — Progress Notes (Signed)
Subjective:  Patient ID: Tristan Lopez, male    DOB: 12/24/1930  Age: 86 y.o. MRN: [id number]  CC:  Chief Complaint  Patient presents with  . Hypertension  . Cough    for a month  . Other    Vitamin D Deficiency, liver lesion, hernia  . Hyperlipidemia      HPI  The patient arrives today accompanied by his son for the above.   Hypertension: He continues on his propanolol, amlodipine, benazepril, and frusemide.  He is tolerating his medications well.  Cough: He tells me that he has been experiencing a cough that has been ongoing for approximately 1 month.  It worsens at night when he lays down.  Honey seems to help the cough.  He does produce thick sputum intermittently.  He has not been running a fever, or experiencing any weight loss or shortness of breath.  He does have a history of COPD as well as GERD.  Vitamin D deficiency: He continues on his vitamin D3 supplement.  Takes about 2000 IUs of vitamin D3 daily.  He is due for serum check today.  Liver lesion: He does have a history of an abdominal aortic aneurysm.  He does follow with Dr. Shela Nevin ksonfor this.  He had an ultrasound back in March or 2021 for further evaluation.  At that time the lesion was noted on his liver.  The radiologist recommended a abdominal CT scan for further evaluation of the lesion.  Per chart review I do see that back in 2013 benign liver cysts were noted on a prior CT scan.  Abdominal hernia: He also has a history of abdominal hernia.  It is causing him some discomfort.  He did see general surgery back in 2019, and at that time because his symptoms were fairly mild it was recommended that no surgery be conducted.  The patient does have some significant comorbidities as well as the fact that he is on chronic anticoagulation.  Now, the hernia is seeming to bother him on a regular basis.  He has tried an abdominal binder which seems to help mildly, however the binder itself can be fairly uncomfortable.   The patient is wondering if there is anything else that can be done to treat his hernia without surgery.  Hyperlipidemia: He does have a history of hyperlipidemia and continues on rosuvastatin daily.  He is due for lipid panel checked today.   Past Medical History:  Diagnosis Date  . Abdominal aortic aneurysm without rupture ([place])   . Bilateral renal cysts   . BPH (benign prostatic hyperplasia)   . Carotid artery occlusion   . Chronic atrial fibrillation ([place])   . COPD (chronic obstructive pulmonary disease) ([place])   . Diverticulosis    Pancolonic  . Essential hypertension   . Hemorrhoids   . Hiatal hernia   . Hypercholesteremia   . Liver cyst    Benign by liver biopsy September 2013  . Nephrolithiasis   . Pre-diabetes   . Reflux esophagitis   . Sleep apnea    Uses CPAP  . Tubular adenoma       Family History  Problem Relation Age of Onset  . Breast cancer Mother   . Other Mother        Essential tremor  . Stroke Mother   . Heart disease Father        Pacemaker  . Heart attack Maternal Grandfather   . Colon cancer Neg Hx   .  Liver disease Neg Hx     Social History   Social History Narrative  . Not on file   Social History   Tobacco Use  . Smoking status: Former Smoker    Packs/day: 0.50    Types: Cigarettes    Quit date: 1992    Years since quitting: 29.5  . Smokeless tobacco: Never Used  Substance Use Topics  . Alcohol use: No     Current Meds  Medication Sig  . Cholecalciferol (VITAMIN D-3) 1000 units CAPS Take 2,000 Units by mouth.  . furosemide (LASIX) 20 MG tablet Take 20 mg daily as needed for leg swelling  . potassium chloride (KLOR-CON) 10 MEQ tablet Take 1 tablet by mouth daily when you take furosemide  . Probiotic Product (DIGESTIVE ADVANTAGE PO) Take by mouth 2 (two) times daily.   . propranolol (INDERAL) 20 MG tablet TAKE ONE TABLET BY MOUTH DAILY  . warfarin (COUMADIN) 5 MG tablet TAKE ONE TABLET BY MOUTH EVERY DAY    ROS:  Review  of Systems  Constitutional: Negative for fever.  HENT: Negative for sore throat.   Respiratory: Positive for cough and sputum production. Negative for shortness of breath and wheezing.   Cardiovascular: Negative for chest pain.  Gastrointestinal: Positive for abdominal pain and diarrhea (intermittently).     Objective:   Today's Vitals: BP 140/70 (BP Location: Left Arm, Patient Position: Sitting, Cuff Size: Normal)   Pulse (!) 51   Temp 97.9 F (36.6 C) (Temporal)   Ht 5' 9"  (1.753 m)   Wt 239 lb 12.8 oz (108.8 kg)   SpO2 95%   BMI 35.41 kg/m  Vitals with BMI 07/28/2019 05/27/2019 05/27/2019  Height 5' 9"  - 5' 9"   Weight 239 lbs 13 oz - 236 lbs  BMI 38.6 - 85.48  Systolic 830 141 597  Diastolic 70 78 81  Pulse 51 - 60     Physical Exam Vitals reviewed.  Constitutional:      Appearance: Normal appearance.  HENT:     Head: Normocephalic and atraumatic.  Cardiovascular:     Rate and Rhythm: Normal rate and regular rhythm.  Pulmonary:     Effort: Pulmonary effort is normal.     Breath sounds: Normal breath sounds.  Abdominal:     General: Bowel sounds are normal.     Palpations: Abdomen is soft.     Tenderness: There is no abdominal tenderness.  Musculoskeletal:     Cervical back: Neck supple.  Skin:    General: Skin is warm and dry.  Neurological:     Mental Status: He is alert and oriented to person, place, and time.  Psychiatric:        Mood and Affect: Mood normal.        Behavior: Behavior normal.        Thought Content: Thought content normal.        Judgment: Judgment normal.          Assessment and Plan   1. Cough   2. Vitamin D deficiency   3. Hyperlipidemia, unspecified hyperlipidemia type   4. Essential hypertension   5. Liver lesion   6. Hernia of abdominal cavity      Plan: 1.  Well my suspicion is that this cough may be a result of his chronic GERD, I will send him for chest x-ray to rule out infection and/or abnormalities of the lung  tissue.  Further recommendations were made based upon the results of the x-ray.  2.  We will check serum level today, in the meantime he will continue on his current supplement dose.  3.  He will continue on his current dose of his statin.  I will check lipid panel today.  4.  He will continue on his chronic medications for his hypertension.  5.  I did discuss the lesions noted on the patient's ultrasound back in March.  I did recommend that the patient undergo CT scan of his abdomen for further evaluation of these livers as was recommended by radiology.  The patient and his son, they will discuss this and make their decision about whether or not the patient would like to undergo CT scan.  They were told to call this office once they have made the decision.  6.  I did not note a hernia on exam today, however I do see that he has history of abdominal hernia based on chart review.  He has been evaluated by Dr. Arnoldo Morale for this in the past.  I will send a message to Dr. Arnoldo Morale to see if he knows if there is any other recommendations as far as nonsurgical treatments for hernia.  I did discuss this with Dr. Anastasio Champion, and he recommended referral back to surgery.  When I discussed this with the patient and the son, they are wondering if I could reach out to the surgeon as opposed to making the patient have an office visit with the surgeon.  I will reach out to Dr. Arnoldo Morale in further recommendations we made based upon those results.  We did discuss hernia strangulation, and signs and symptoms of this.  We did discuss that if he were to experience any of these signs or symptoms needs to proceed to the emergency department.  They tell me they understand.   Tests ordered Orders Placed This Encounter  Procedures  . DG Chest 2 View  . Vitamin D, 25-hydroxy  . CMP with eGFR([facility])  . Lipid Panel      No orders of the defined types were placed in this encounter.   Patient to follow-up in 3 months, or  sooner pending chest x-ray results, blood work results, and conversation with Dr. Arnoldo Morale.  Ailene Ards, NP

## 2019-07-28 NOTE — Patient Instructions (Signed)
Liver Lesion Noted on Ultrasound: Tristan Lopez underwent ultrasound for further evaluation of his abdominal aorta in March 2021.  At that time the lesion was noted on his liver and radiologist recommended patient have a repeat CT scan of his abdomen to make sure that the lesion on the liver is stable.  My recommendation is that the patient undergo the CT scan. If you opt to have this scan completed, please call Dr. Lanice Shirts office and notify our team.  Please make sure to specify that you would like the CT scan that [facility] discussed with Tristan Lopez at his last visit.  That way [name] can place the order in the computer so that the imaging can be completed.

## 2019-07-28 NOTE — Telephone Encounter (Signed)
Tristan Lopez, please call this patient.  You may need to call his son Glendell Docker (because the patient is quite hard of hearing over the phone).  However, try the patient first because he is quite capable of making his own decisions so he should be given this information directly if possible.  Let the patient know that I did talk to the surgeon and there is not any other recommendations to help the patient with managing his hernia pain other than the abdominal binder.  Thus, my recommendation would be that the patient try to find an abdominal binder that is more comfortable for him.  If the patient would really like to consider surgery, we can refer him back to the surgeon.  Otherwise, there is not much else we can do for symptom management.  Let me know what he says, thank you.

## 2019-07-28 NOTE — Telephone Encounter (Signed)
Tristan Lopez called and he is aware of the recommendation of Mr. Mcglasson Hernia Pain

## 2019-07-28 NOTE — Telephone Encounter (Signed)
I left Dominica Severin (son) a message to return my call

## 2019-07-29 LAB — COMPLETE METABOLIC PANEL WITH GFR
AG Ratio: 1.7 (calc) (ref 1.0–2.5)
ALT: 11 U/L (ref 9–46)
AST: 14 U/L (ref 10–35)
Albumin: 4.2 g/dL (ref 3.6–5.1)
Alkaline phosphatase (APISO): 56 U/L (ref 35–144)
BUN: 21 mg/dL (ref 7–25)
CO2: 29 mmol/L (ref 20–32)
Calcium: 9 mg/dL (ref 8.6–10.3)
Chloride: 103 mmol/L (ref 98–110)
Creat: 0.86 mg/dL (ref 0.70–1.11)
GFR, Est African American: 91 mL/min/{1.73_m2} (ref >=60)
GFR, Est Non African American: 79 mL/min/{1.73_m2} (ref >=60)
Globulin: 2.5 g/dL (calc) (ref 1.9–3.7)
Glucose, Bld: 105 mg/dL — ABNORMAL HIGH (ref 65–99)
Potassium: 4 mmol/L (ref 3.5–5.3)
Sodium: 138 mmol/L (ref 135–146)
Total Bilirubin: 0.9 mg/dL (ref 0.2–1.2)
Total Protein: 6.7 g/dL (ref 6.1–8.1)

## 2019-07-29 LAB — LIPID PANEL
Cholesterol: 143 mg/dL (ref <200)
HDL: 42 mg/dL (ref >=40)
LDL Cholesterol (Calc): 84 mg/dL (calc)
Non-HDL Cholesterol (Calc): 101 mg/dL (calc) (ref <130)
Total CHOL/HDL Ratio: 3.4 (calc) (ref <5.0)
Triglycerides: 78 mg/dL (ref <150)

## 2019-07-29 LAB — VITAMIN D 25 HYDROXY (VIT D DEFICIENCY, FRACTURES): Vit D, 25-Hydroxy: 33 ng/mL (ref 30–100)

## 2019-08-05 ENCOUNTER — Ambulatory Visit (INDEPENDENT_AMBULATORY_CARE_PROVIDER_SITE_OTHER): Payer: Medicare PPO | Admitting: *Deleted

## 2019-08-05 DIAGNOSIS — I482 Chronic atrial fibrillation, unspecified: Secondary | ICD-10-CM

## 2019-08-05 DIAGNOSIS — Z5181 Encounter for therapeutic drug level monitoring: Secondary | ICD-10-CM | POA: Diagnosis not present

## 2019-08-05 LAB — POCT INR: INR: 2.9 (ref 2.0–3.0)

## 2019-08-05 NOTE — Patient Instructions (Signed)
Continue taking 1 tablet daily.  Re-check in 6 weeks. ?

## 2019-08-14 ENCOUNTER — Other Ambulatory Visit (INDEPENDENT_AMBULATORY_CARE_PROVIDER_SITE_OTHER): Payer: Self-pay | Admitting: Internal Medicine

## 2019-08-14 ENCOUNTER — Other Ambulatory Visit (INDEPENDENT_AMBULATORY_CARE_PROVIDER_SITE_OTHER): Payer: Self-pay | Admitting: Nurse Practitioner

## 2019-08-14 DIAGNOSIS — I10 Essential (primary) hypertension: Secondary | ICD-10-CM

## 2019-09-10 ENCOUNTER — Other Ambulatory Visit (INDEPENDENT_AMBULATORY_CARE_PROVIDER_SITE_OTHER): Payer: Self-pay | Admitting: Internal Medicine

## 2019-09-16 ENCOUNTER — Ambulatory Visit (INDEPENDENT_AMBULATORY_CARE_PROVIDER_SITE_OTHER): Payer: Medicare PPO | Admitting: *Deleted

## 2019-09-16 DIAGNOSIS — I482 Chronic atrial fibrillation, unspecified: Secondary | ICD-10-CM | POA: Diagnosis not present

## 2019-09-16 DIAGNOSIS — Z5181 Encounter for therapeutic drug level monitoring: Secondary | ICD-10-CM

## 2019-09-16 LAB — POCT INR: INR: 2.6 (ref 2.0–3.0)

## 2019-09-16 NOTE — Patient Instructions (Signed)
Continue taking 1 tablet daily.  Re-check in 6 weeks. ?

## 2019-09-23 ENCOUNTER — Ambulatory Visit: Payer: Medicare PPO | Admitting: Cardiology

## 2019-09-24 ENCOUNTER — Telehealth (INDEPENDENT_AMBULATORY_CARE_PROVIDER_SITE_OTHER): Payer: Self-pay

## 2019-09-24 NOTE — Telephone Encounter (Signed)
Phy plan of care. Son is at [facility]. They will be moviing in site located in [place]. Please fill out and we can fax  back to [facility]. Son said it it getting time for them to be somewhere safe and nursing care around the clock & safe community . Son lives out of town.  The [facility] will send me the from via e-mail. Will place in Dr Anastasio Champion folder to be completed.

## 2019-09-29 ENCOUNTER — Telehealth (INDEPENDENT_AMBULATORY_CARE_PROVIDER_SITE_OTHER): Payer: Self-pay

## 2019-09-29 NOTE — Telephone Encounter (Signed)
Hello Dr Anastasio Champion:  Here is the Physician's Plan of Care [facility]) that needs to be completed before Tristan Lopez can move into our community.  I have also attached the DNR as requested by his son, Dollie Mayse [facility]).  Please let me or Lonn Georgia know if you have any questions.  Lonn Georgia is our Scientific laboratory technician here at [facility].     Thank You!  [facility] [phone])  [place], [place] [phone]  Direct [phone]

## 2019-09-29 NOTE — Telephone Encounter (Signed)
In pink folder

## 2019-09-29 NOTE — Telephone Encounter (Signed)
Okay, I will fill it out when I get a chance.

## 2019-10-02 ENCOUNTER — Other Ambulatory Visit (INDEPENDENT_AMBULATORY_CARE_PROVIDER_SITE_OTHER): Payer: Self-pay | Admitting: Internal Medicine

## 2019-10-22 ENCOUNTER — Telehealth (INDEPENDENT_AMBULATORY_CARE_PROVIDER_SITE_OTHER): Payer: Self-pay

## 2019-10-22 DIAGNOSIS — I10 Essential (primary) hypertension: Secondary | ICD-10-CM | POA: Diagnosis not present

## 2019-10-22 DIAGNOSIS — K449 Diaphragmatic hernia without obstruction or gangrene: Secondary | ICD-10-CM | POA: Diagnosis not present

## 2019-10-22 DIAGNOSIS — D369 Benign neoplasm, unspecified site: Secondary | ICD-10-CM | POA: Diagnosis not present

## 2019-10-22 DIAGNOSIS — R7303 Prediabetes: Secondary | ICD-10-CM | POA: Diagnosis not present

## 2019-10-22 DIAGNOSIS — G250 Essential tremor: Secondary | ICD-10-CM | POA: Diagnosis not present

## 2019-10-22 DIAGNOSIS — J449 Chronic obstructive pulmonary disease, unspecified: Secondary | ICD-10-CM | POA: Diagnosis not present

## 2019-10-22 DIAGNOSIS — I482 Chronic atrial fibrillation, unspecified: Secondary | ICD-10-CM | POA: Diagnosis not present

## 2019-10-22 DIAGNOSIS — K769 Liver disease, unspecified: Secondary | ICD-10-CM | POA: Diagnosis not present

## 2019-10-22 DIAGNOSIS — K5790 Diverticulosis of intestine, part unspecified, without perforation or abscess without bleeding: Secondary | ICD-10-CM | POA: Diagnosis not present

## 2019-10-23 ENCOUNTER — Telehealth: Payer: Self-pay | Admitting: *Deleted

## 2019-10-23 NOTE — Telephone Encounter (Signed)
Spoke with Son Dominica Severin and Lonn Georgia at [facility].  OK for pt to have INR checked at [facility] as ordered and results called to me.    INR due 10/28/2019 Pt is taking warfarin 5mg  daily Call or fax results to Edrick Oh RN, [place] Phone:  [phone]  Fax: [phone]  VO Dr. Inocente Salles [place]/L. Joneen Caraway RN  Faxed to [employer] # [phone]

## 2019-10-23 NOTE — Telephone Encounter (Signed)
Tristan Lopez -son called stating that patient is now living in an Assistance Living facility. They are wanting patient to have his coumdin checked at the facility and call results to our office. Please call son [phone].

## 2019-10-27 ENCOUNTER — Other Ambulatory Visit: Payer: Self-pay

## 2019-10-27 ENCOUNTER — Encounter (INDEPENDENT_AMBULATORY_CARE_PROVIDER_SITE_OTHER): Payer: Self-pay | Admitting: Internal Medicine

## 2019-10-27 ENCOUNTER — Ambulatory Visit (INDEPENDENT_AMBULATORY_CARE_PROVIDER_SITE_OTHER): Payer: Medicare PPO | Admitting: Internal Medicine

## 2019-10-27 VITALS — BP 128/64 | HR 71 | Temp 97.7°F | Ht 69.0 in | Wt 241.0 lb

## 2019-10-27 DIAGNOSIS — K219 Gastro-esophageal reflux disease without esophagitis: Secondary | ICD-10-CM

## 2019-10-27 DIAGNOSIS — K769 Liver disease, unspecified: Secondary | ICD-10-CM | POA: Diagnosis not present

## 2019-10-27 DIAGNOSIS — E785 Hyperlipidemia, unspecified: Secondary | ICD-10-CM | POA: Diagnosis not present

## 2019-10-27 DIAGNOSIS — I10 Essential (primary) hypertension: Secondary | ICD-10-CM | POA: Diagnosis not present

## 2019-10-27 DIAGNOSIS — R7303 Prediabetes: Secondary | ICD-10-CM | POA: Diagnosis not present

## 2019-10-27 DIAGNOSIS — D369 Benign neoplasm, unspecified site: Secondary | ICD-10-CM | POA: Diagnosis not present

## 2019-10-27 DIAGNOSIS — E559 Vitamin D deficiency, unspecified: Secondary | ICD-10-CM | POA: Diagnosis not present

## 2019-10-27 DIAGNOSIS — I482 Chronic atrial fibrillation, unspecified: Secondary | ICD-10-CM | POA: Diagnosis not present

## 2019-10-27 DIAGNOSIS — J449 Chronic obstructive pulmonary disease, unspecified: Secondary | ICD-10-CM | POA: Diagnosis not present

## 2019-10-27 DIAGNOSIS — G250 Essential tremor: Secondary | ICD-10-CM | POA: Diagnosis not present

## 2019-10-27 DIAGNOSIS — K449 Diaphragmatic hernia without obstruction or gangrene: Secondary | ICD-10-CM | POA: Diagnosis not present

## 2019-10-27 DIAGNOSIS — K5790 Diverticulosis of intestine, part unspecified, without perforation or abscess without bleeding: Secondary | ICD-10-CM | POA: Diagnosis not present

## 2019-10-27 NOTE — Progress Notes (Signed)
Metrics: Intervention Frequency ACO  Documented Smoking Status Yearly  Screened one or more times in 24 months  Cessation Counseling or  Active cessation medication Past 24 months  Past 24 months   Guideline developer: UpToDate (See UpToDate for funding source) Date Released: 2014       Wellness Office Visit  Subjective:  Patient ID: Tristan Lopez, male    DOB: 12/13/1930  Age: 86 y.o. MRN: [id number]  CC: This man comes in for follow-up of hypertension, vitamin D deficiency and hyperlipidemia. HPI  He has now moved, with his wife, to an assisted living facility in [place].  He is doing well.  He continues on vitamin D3 2000 units daily. He continues on Protonix twice a day for acid reflux disease.  He had a cough the last time he was seen by [name] and a chest x-ray was negative.  [name] felt that this cough may well be due to acid reflux disease and she suggested elevation of bed and this seems to have helped him. Past Medical History:  Diagnosis Date  . Abdominal aortic aneurysm without rupture ([place])   . Bilateral renal cysts   . BPH (benign prostatic hyperplasia)   . Carotid artery occlusion   . Chronic atrial fibrillation ([place])   . COPD (chronic obstructive pulmonary disease) ([place])   . Diverticulosis    Pancolonic  . Essential hypertension   . Hemorrhoids   . Hiatal hernia   . Hypercholesteremia   . Liver cyst    Benign by liver biopsy September 2013  . Nephrolithiasis   . Pre-diabetes   . Reflux esophagitis   . Sleep apnea    Uses CPAP  . Tubular adenoma    Past Surgical History:  Procedure Laterality Date  . COLONOSCOPY     2003, no polyps per patient. Dr. West Carbo  . COLONOSCOPY  11/01/11   Dr. Gala Romney- tubular adenoma,suboptimal preparation, internal hemorrhoids, o/w normal rectum. pancolonic diverticulosis  . ESOPHAGOGASTRODUODENOSCOPY  11/01/11   Dr. Gala Romney- erosive reflux esophagitis along with a patulous EG junction, hialtal hernia, antral  erosions with possible area of healing ulceration- s/p bx= chronic erosive gastritis, no malignancy  . HERNIA REPAIR     x2, bilateral inguinal   . KIDNEY STONE SURGERY    . KNEE ARTHROSCOPY     X2  . NASAL ENDOSCOPY       Family History  Problem Relation Age of Onset  . Breast cancer Mother   . Other Mother        Essential tremor  . Stroke Mother   . Heart disease Father        Pacemaker  . Heart attack Maternal Grandfather   . Colon cancer Neg Hx   . Liver disease Neg Hx     Social History   Social History Narrative   Married for 79 years.Retired.Now living in [place].   Social History   Tobacco Use  . Smoking status: Former Smoker    Packs/day: 0.50    Types: Cigarettes    Quit date: 1992    Years since quitting: 29.7  . Smokeless tobacco: Never Used  Substance Use Topics  . Alcohol use: No    Current Meds  Medication Sig  . Cholecalciferol (VITAMIN D-3) 1000 units CAPS Take 2,000 Units by mouth.  . furosemide (LASIX) 20 MG tablet Take 20 mg daily as needed for leg swelling  . pantoprazole (PROTONIX) 40 MG tablet TAKE 1 TABLET BY MOUTH TWICE DAILY  .  potassium chloride (KLOR-CON) 10 MEQ tablet TAKE 1 TABLET BY MOUTH DAILY WHEN YOU TAKE FUROSEMIDE  . Probiotic Product (DIGESTIVE ADVANTAGE PO) Take by mouth 2 (two) times daily.   . propranolol (INDERAL) 20 MG tablet TAKE ONE TABLET BY MOUTH DAILY  . warfarin (COUMADIN) 5 MG tablet TAKE ONE TABLET BY MOUTH EVERY DAY      Depression screen [facility] 2/9 04/21/2019 02/10/2019  Decreased Interest 0 0  Down, Depressed, Hopeless 0 0  PHQ - 2 Score 0 0     Objective:   Today's Vitals: BP 128/64 (BP Location: Right Arm, Patient Position: Sitting, Cuff Size: Normal)   Pulse 71   Temp 97.7 F (36.5 C)   Ht 5\' 9"  (1.753 m)   Wt 241 lb (109.3 kg)   SpO2 96%   BMI 35.59 kg/m  Vitals with BMI 10/27/2019 07/28/2019 05/27/2019  Height 5\' 9"  5\' 9"  -  Weight 241 lbs 239 lbs 13 oz -  BMI 93.26  71.2 -  Systolic 458 099 833  Diastolic 64 70 78  Pulse 71 51 -     Physical Exam  He looks systemically well.  Blood pressure is excellent for his age.  Weight is stable.     Assessment   1. Vitamin D deficiency   2. Essential hypertension   3. Hyperlipidemia, unspecified hyperlipidemia type   4. Gastroesophageal reflux disease without esophagitis       Tests ordered No orders of the defined types were placed in this encounter.    Plan: 1. I recommended, based on vitamin D levels done previously to increase the vitamin D3 to 5000 units daily. 2. He will continue Protonix for his gastroesophageal reflux disease which appears to keep him stable. 3. He will continue on propranolol which seems to have helped tremor as well as hypertension. 4. Follow-up in 6 months with [name].   No orders of the defined types were placed in this encounter.   [name], MD

## 2019-10-28 DIAGNOSIS — Z7901 Long term (current) use of anticoagulants: Secondary | ICD-10-CM | POA: Diagnosis not present

## 2019-10-29 ENCOUNTER — Ambulatory Visit (INDEPENDENT_AMBULATORY_CARE_PROVIDER_SITE_OTHER): Payer: Medicare PPO | Admitting: *Deleted

## 2019-10-29 DIAGNOSIS — R7303 Prediabetes: Secondary | ICD-10-CM | POA: Diagnosis not present

## 2019-10-29 DIAGNOSIS — K769 Liver disease, unspecified: Secondary | ICD-10-CM | POA: Diagnosis not present

## 2019-10-29 DIAGNOSIS — I10 Essential (primary) hypertension: Secondary | ICD-10-CM | POA: Diagnosis not present

## 2019-10-29 DIAGNOSIS — I482 Chronic atrial fibrillation, unspecified: Secondary | ICD-10-CM

## 2019-10-29 DIAGNOSIS — K449 Diaphragmatic hernia without obstruction or gangrene: Secondary | ICD-10-CM | POA: Diagnosis not present

## 2019-10-29 DIAGNOSIS — Z5181 Encounter for therapeutic drug level monitoring: Secondary | ICD-10-CM | POA: Diagnosis not present

## 2019-10-29 DIAGNOSIS — K5790 Diverticulosis of intestine, part unspecified, without perforation or abscess without bleeding: Secondary | ICD-10-CM | POA: Diagnosis not present

## 2019-10-29 DIAGNOSIS — D369 Benign neoplasm, unspecified site: Secondary | ICD-10-CM | POA: Diagnosis not present

## 2019-10-29 DIAGNOSIS — G250 Essential tremor: Secondary | ICD-10-CM | POA: Diagnosis not present

## 2019-10-29 DIAGNOSIS — J449 Chronic obstructive pulmonary disease, unspecified: Secondary | ICD-10-CM | POA: Diagnosis not present

## 2019-10-29 LAB — PROTIME-INR: INR: 1.8 — AB (ref 0.9–1.1)

## 2019-10-29 NOTE — Patient Instructions (Addendum)
Take warfarin 7.5mg  tonight and tomorrow night then continue 5mg  daily.  Recheck in 2 weeks at [facility]. Order given and faxed to [facility]

## 2019-11-02 DIAGNOSIS — J449 Chronic obstructive pulmonary disease, unspecified: Secondary | ICD-10-CM | POA: Diagnosis not present

## 2019-11-02 DIAGNOSIS — K769 Liver disease, unspecified: Secondary | ICD-10-CM | POA: Diagnosis not present

## 2019-11-02 DIAGNOSIS — K449 Diaphragmatic hernia without obstruction or gangrene: Secondary | ICD-10-CM | POA: Diagnosis not present

## 2019-11-02 DIAGNOSIS — R7303 Prediabetes: Secondary | ICD-10-CM | POA: Diagnosis not present

## 2019-11-02 DIAGNOSIS — I482 Chronic atrial fibrillation, unspecified: Secondary | ICD-10-CM | POA: Diagnosis not present

## 2019-11-02 DIAGNOSIS — G250 Essential tremor: Secondary | ICD-10-CM | POA: Diagnosis not present

## 2019-11-02 DIAGNOSIS — D369 Benign neoplasm, unspecified site: Secondary | ICD-10-CM | POA: Diagnosis not present

## 2019-11-02 DIAGNOSIS — I10 Essential (primary) hypertension: Secondary | ICD-10-CM | POA: Diagnosis not present

## 2019-11-02 DIAGNOSIS — K5790 Diverticulosis of intestine, part unspecified, without perforation or abscess without bleeding: Secondary | ICD-10-CM | POA: Diagnosis not present

## 2019-11-04 DIAGNOSIS — J449 Chronic obstructive pulmonary disease, unspecified: Secondary | ICD-10-CM | POA: Diagnosis not present

## 2019-11-04 DIAGNOSIS — I10 Essential (primary) hypertension: Secondary | ICD-10-CM | POA: Diagnosis not present

## 2019-11-04 DIAGNOSIS — G250 Essential tremor: Secondary | ICD-10-CM | POA: Diagnosis not present

## 2019-11-04 DIAGNOSIS — K449 Diaphragmatic hernia without obstruction or gangrene: Secondary | ICD-10-CM | POA: Diagnosis not present

## 2019-11-04 DIAGNOSIS — K5790 Diverticulosis of intestine, part unspecified, without perforation or abscess without bleeding: Secondary | ICD-10-CM | POA: Diagnosis not present

## 2019-11-04 DIAGNOSIS — D369 Benign neoplasm, unspecified site: Secondary | ICD-10-CM | POA: Diagnosis not present

## 2019-11-04 DIAGNOSIS — K769 Liver disease, unspecified: Secondary | ICD-10-CM | POA: Diagnosis not present

## 2019-11-04 DIAGNOSIS — I482 Chronic atrial fibrillation, unspecified: Secondary | ICD-10-CM | POA: Diagnosis not present

## 2019-11-04 DIAGNOSIS — R7303 Prediabetes: Secondary | ICD-10-CM | POA: Diagnosis not present

## 2019-11-09 DIAGNOSIS — D369 Benign neoplasm, unspecified site: Secondary | ICD-10-CM | POA: Diagnosis not present

## 2019-11-09 DIAGNOSIS — I10 Essential (primary) hypertension: Secondary | ICD-10-CM | POA: Diagnosis not present

## 2019-11-09 DIAGNOSIS — R7303 Prediabetes: Secondary | ICD-10-CM | POA: Diagnosis not present

## 2019-11-09 DIAGNOSIS — K5790 Diverticulosis of intestine, part unspecified, without perforation or abscess without bleeding: Secondary | ICD-10-CM | POA: Diagnosis not present

## 2019-11-09 DIAGNOSIS — I482 Chronic atrial fibrillation, unspecified: Secondary | ICD-10-CM | POA: Diagnosis not present

## 2019-11-09 DIAGNOSIS — K769 Liver disease, unspecified: Secondary | ICD-10-CM | POA: Diagnosis not present

## 2019-11-09 DIAGNOSIS — G250 Essential tremor: Secondary | ICD-10-CM | POA: Diagnosis not present

## 2019-11-09 DIAGNOSIS — K449 Diaphragmatic hernia without obstruction or gangrene: Secondary | ICD-10-CM | POA: Diagnosis not present

## 2019-11-09 DIAGNOSIS — J449 Chronic obstructive pulmonary disease, unspecified: Secondary | ICD-10-CM | POA: Diagnosis not present

## 2019-11-12 ENCOUNTER — Encounter: Payer: Self-pay | Admitting: Internal Medicine

## 2019-11-12 DIAGNOSIS — I482 Chronic atrial fibrillation, unspecified: Secondary | ICD-10-CM | POA: Diagnosis not present

## 2019-11-12 LAB — PROTIME-INR: INR: 2 — AB (ref 0.9–1.1)

## 2019-11-16 DIAGNOSIS — R7303 Prediabetes: Secondary | ICD-10-CM | POA: Diagnosis not present

## 2019-11-16 DIAGNOSIS — K5790 Diverticulosis of intestine, part unspecified, without perforation or abscess without bleeding: Secondary | ICD-10-CM | POA: Diagnosis not present

## 2019-11-16 DIAGNOSIS — K449 Diaphragmatic hernia without obstruction or gangrene: Secondary | ICD-10-CM | POA: Diagnosis not present

## 2019-11-16 DIAGNOSIS — I482 Chronic atrial fibrillation, unspecified: Secondary | ICD-10-CM | POA: Diagnosis not present

## 2019-11-16 DIAGNOSIS — I10 Essential (primary) hypertension: Secondary | ICD-10-CM | POA: Diagnosis not present

## 2019-11-16 DIAGNOSIS — D369 Benign neoplasm, unspecified site: Secondary | ICD-10-CM | POA: Diagnosis not present

## 2019-11-16 DIAGNOSIS — J449 Chronic obstructive pulmonary disease, unspecified: Secondary | ICD-10-CM | POA: Diagnosis not present

## 2019-11-16 DIAGNOSIS — K769 Liver disease, unspecified: Secondary | ICD-10-CM | POA: Diagnosis not present

## 2019-11-16 DIAGNOSIS — G250 Essential tremor: Secondary | ICD-10-CM | POA: Diagnosis not present

## 2019-11-19 DIAGNOSIS — K769 Liver disease, unspecified: Secondary | ICD-10-CM | POA: Diagnosis not present

## 2019-11-19 DIAGNOSIS — J449 Chronic obstructive pulmonary disease, unspecified: Secondary | ICD-10-CM | POA: Diagnosis not present

## 2019-11-19 DIAGNOSIS — R7303 Prediabetes: Secondary | ICD-10-CM | POA: Diagnosis not present

## 2019-11-19 DIAGNOSIS — D369 Benign neoplasm, unspecified site: Secondary | ICD-10-CM | POA: Diagnosis not present

## 2019-11-19 DIAGNOSIS — G250 Essential tremor: Secondary | ICD-10-CM | POA: Diagnosis not present

## 2019-11-19 DIAGNOSIS — K449 Diaphragmatic hernia without obstruction or gangrene: Secondary | ICD-10-CM | POA: Diagnosis not present

## 2019-11-19 DIAGNOSIS — I482 Chronic atrial fibrillation, unspecified: Secondary | ICD-10-CM | POA: Diagnosis not present

## 2019-11-19 DIAGNOSIS — K5790 Diverticulosis of intestine, part unspecified, without perforation or abscess without bleeding: Secondary | ICD-10-CM | POA: Diagnosis not present

## 2019-11-19 DIAGNOSIS — I10 Essential (primary) hypertension: Secondary | ICD-10-CM | POA: Diagnosis not present

## 2019-11-23 ENCOUNTER — Other Ambulatory Visit (INDEPENDENT_AMBULATORY_CARE_PROVIDER_SITE_OTHER): Payer: Self-pay | Admitting: Nurse Practitioner

## 2019-11-23 ENCOUNTER — Other Ambulatory Visit (INDEPENDENT_AMBULATORY_CARE_PROVIDER_SITE_OTHER): Payer: Self-pay | Admitting: Internal Medicine

## 2019-11-23 DIAGNOSIS — K449 Diaphragmatic hernia without obstruction or gangrene: Secondary | ICD-10-CM | POA: Diagnosis not present

## 2019-11-23 DIAGNOSIS — D369 Benign neoplasm, unspecified site: Secondary | ICD-10-CM | POA: Diagnosis not present

## 2019-11-23 DIAGNOSIS — K5790 Diverticulosis of intestine, part unspecified, without perforation or abscess without bleeding: Secondary | ICD-10-CM | POA: Diagnosis not present

## 2019-11-23 DIAGNOSIS — K769 Liver disease, unspecified: Secondary | ICD-10-CM | POA: Diagnosis not present

## 2019-11-23 DIAGNOSIS — R7303 Prediabetes: Secondary | ICD-10-CM | POA: Diagnosis not present

## 2019-11-23 DIAGNOSIS — I10 Essential (primary) hypertension: Secondary | ICD-10-CM | POA: Diagnosis not present

## 2019-11-23 DIAGNOSIS — I482 Chronic atrial fibrillation, unspecified: Secondary | ICD-10-CM | POA: Diagnosis not present

## 2019-11-23 DIAGNOSIS — G250 Essential tremor: Secondary | ICD-10-CM | POA: Diagnosis not present

## 2019-11-23 DIAGNOSIS — J449 Chronic obstructive pulmonary disease, unspecified: Secondary | ICD-10-CM | POA: Diagnosis not present

## 2019-11-25 DIAGNOSIS — G250 Essential tremor: Secondary | ICD-10-CM | POA: Diagnosis not present

## 2019-11-25 DIAGNOSIS — K5790 Diverticulosis of intestine, part unspecified, without perforation or abscess without bleeding: Secondary | ICD-10-CM | POA: Diagnosis not present

## 2019-11-25 DIAGNOSIS — I482 Chronic atrial fibrillation, unspecified: Secondary | ICD-10-CM | POA: Diagnosis not present

## 2019-11-25 DIAGNOSIS — D369 Benign neoplasm, unspecified site: Secondary | ICD-10-CM | POA: Diagnosis not present

## 2019-11-25 DIAGNOSIS — R7303 Prediabetes: Secondary | ICD-10-CM | POA: Diagnosis not present

## 2019-11-25 DIAGNOSIS — J449 Chronic obstructive pulmonary disease, unspecified: Secondary | ICD-10-CM | POA: Diagnosis not present

## 2019-11-25 DIAGNOSIS — K769 Liver disease, unspecified: Secondary | ICD-10-CM | POA: Diagnosis not present

## 2019-11-25 DIAGNOSIS — K449 Diaphragmatic hernia without obstruction or gangrene: Secondary | ICD-10-CM | POA: Diagnosis not present

## 2019-11-25 DIAGNOSIS — I10 Essential (primary) hypertension: Secondary | ICD-10-CM | POA: Diagnosis not present

## 2019-11-25 DIAGNOSIS — I6529 Occlusion and stenosis of unspecified carotid artery: Secondary | ICD-10-CM

## 2019-11-27 DIAGNOSIS — Y999 Unspecified external cause status: Secondary | ICD-10-CM | POA: Diagnosis not present

## 2019-11-27 DIAGNOSIS — I714 Abdominal aortic aneurysm, without rupture: Secondary | ICD-10-CM | POA: Diagnosis not present

## 2019-11-27 DIAGNOSIS — I959 Hypotension, unspecified: Secondary | ICD-10-CM | POA: Diagnosis not present

## 2019-11-27 DIAGNOSIS — Z7901 Long term (current) use of anticoagulants: Secondary | ICD-10-CM | POA: Diagnosis not present

## 2019-11-27 DIAGNOSIS — I10 Essential (primary) hypertension: Secondary | ICD-10-CM | POA: Diagnosis not present

## 2019-11-27 DIAGNOSIS — R251 Tremor, unspecified: Secondary | ICD-10-CM | POA: Diagnosis not present

## 2019-11-27 DIAGNOSIS — X58XXXA Exposure to other specified factors, initial encounter: Secondary | ICD-10-CM | POA: Diagnosis not present

## 2019-11-27 DIAGNOSIS — W19XXXA Unspecified fall, initial encounter: Secondary | ICD-10-CM | POA: Diagnosis not present

## 2019-11-27 DIAGNOSIS — R29700 NIHSS score 0: Secondary | ICD-10-CM | POA: Diagnosis not present

## 2019-11-27 DIAGNOSIS — R531 Weakness: Secondary | ICD-10-CM | POA: Diagnosis not present

## 2019-11-27 DIAGNOSIS — M5030 Other cervical disc degeneration, unspecified cervical region: Secondary | ICD-10-CM | POA: Diagnosis not present

## 2019-11-27 DIAGNOSIS — I4891 Unspecified atrial fibrillation: Secondary | ICD-10-CM | POA: Diagnosis not present

## 2019-11-27 DIAGNOSIS — S46012A Strain of muscle(s) and tendon(s) of the rotator cuff of left shoulder, initial encounter: Secondary | ICD-10-CM | POA: Diagnosis not present

## 2019-11-27 DIAGNOSIS — R9082 White matter disease, unspecified: Secondary | ICD-10-CM | POA: Diagnosis not present

## 2019-11-30 DIAGNOSIS — Z03818 Encounter for observation for suspected exposure to other biological agents ruled out: Secondary | ICD-10-CM | POA: Diagnosis not present

## 2019-12-02 DIAGNOSIS — I10 Essential (primary) hypertension: Secondary | ICD-10-CM | POA: Diagnosis not present

## 2019-12-02 DIAGNOSIS — K5790 Diverticulosis of intestine, part unspecified, without perforation or abscess without bleeding: Secondary | ICD-10-CM | POA: Diagnosis not present

## 2019-12-02 DIAGNOSIS — K449 Diaphragmatic hernia without obstruction or gangrene: Secondary | ICD-10-CM | POA: Diagnosis not present

## 2019-12-02 DIAGNOSIS — K769 Liver disease, unspecified: Secondary | ICD-10-CM | POA: Diagnosis not present

## 2019-12-02 DIAGNOSIS — I482 Chronic atrial fibrillation, unspecified: Secondary | ICD-10-CM | POA: Diagnosis not present

## 2019-12-02 DIAGNOSIS — G250 Essential tremor: Secondary | ICD-10-CM | POA: Diagnosis not present

## 2019-12-02 DIAGNOSIS — R7303 Prediabetes: Secondary | ICD-10-CM | POA: Diagnosis not present

## 2019-12-02 DIAGNOSIS — D369 Benign neoplasm, unspecified site: Secondary | ICD-10-CM | POA: Diagnosis not present

## 2019-12-02 DIAGNOSIS — J449 Chronic obstructive pulmonary disease, unspecified: Secondary | ICD-10-CM | POA: Diagnosis not present

## 2019-12-07 ENCOUNTER — Telehealth (INDEPENDENT_AMBULATORY_CARE_PROVIDER_SITE_OTHER): Payer: Self-pay

## 2019-12-07 DIAGNOSIS — Z03818 Encounter for observation for suspected exposure to other biological agents ruled out: Secondary | ICD-10-CM | POA: Diagnosis not present

## 2019-12-07 DIAGNOSIS — U071 COVID-19: Secondary | ICD-10-CM | POA: Diagnosis not present

## 2019-12-07 NOTE — Telephone Encounter (Signed)
CONTACTED [facility] INFUSION NURSE IN CHARGE MARY B.,RN    PRIOR TO ORDERS. TO SEE IF THEY MAY HAVE HAD TRANSPORTATION FOR SNF'S AND HOMEBOUND PATIENTS IF NEED BE. THEY WILL CONTACT AND HELP IF NEED WITH OUR PATIENTS.    COVID -19 OUTPATIENT MONOCLONAL ANTIBODY ORDERS. ORDERS WITH NURSING CARE: CONTACT, DROPLET PRECAUTIONS, RESTRICTED ROOM, VS,MEDICATIONS, INFUSIONS CRITERIA, & EXCLUSIONS CRITERIA. ORDER SHEET  SIGNED & FAXED BACK TO SNF @ [facility]. FX: [phone].

## 2019-12-10 DIAGNOSIS — I482 Chronic atrial fibrillation, unspecified: Secondary | ICD-10-CM | POA: Diagnosis not present

## 2019-12-23 ENCOUNTER — Other Ambulatory Visit (INDEPENDENT_AMBULATORY_CARE_PROVIDER_SITE_OTHER): Payer: Self-pay | Admitting: Internal Medicine

## 2019-12-24 ENCOUNTER — Encounter (INDEPENDENT_AMBULATORY_CARE_PROVIDER_SITE_OTHER): Payer: Self-pay | Admitting: Nurse Practitioner

## 2019-12-24 ENCOUNTER — Encounter: Payer: Self-pay | Admitting: Internal Medicine

## 2019-12-24 ENCOUNTER — Telehealth (INDEPENDENT_AMBULATORY_CARE_PROVIDER_SITE_OTHER): Payer: Medicare PPO | Admitting: Nurse Practitioner

## 2019-12-24 DIAGNOSIS — R2689 Other abnormalities of gait and mobility: Secondary | ICD-10-CM

## 2019-12-24 DIAGNOSIS — Z7901 Long term (current) use of anticoagulants: Secondary | ICD-10-CM | POA: Diagnosis not present

## 2019-12-24 DIAGNOSIS — R5383 Other fatigue: Secondary | ICD-10-CM

## 2019-12-24 DIAGNOSIS — R0602 Shortness of breath: Secondary | ICD-10-CM

## 2019-12-24 DIAGNOSIS — I482 Chronic atrial fibrillation, unspecified: Secondary | ICD-10-CM | POA: Diagnosis not present

## 2019-12-24 LAB — PROTIME-INR: INR: 3.1 — AB (ref 0.9–1.1)

## 2019-12-24 NOTE — Progress Notes (Signed)
Due to national recommendations of social distancing related to the [place] pandemic, an audio-only tele-health visit was felt to be the most appropriate encounter type for this patient today. I connected with  [name] on 12/24/19 utilizing audio-only technology and verified that I am speaking with the correct person using two identifiers. The patient was located at their home ([place]), and I was located at the office of [employer] during the encounter. I discussed the limitations of evaluation and management by telemedicine. The patient expressed understanding and agreed to proceed.     Subjective:  Patient ID: Tristan Lopez, male    DOB: 03/14/1930  Age: 86 y.o. MRN: [id number]  CC:  Chief Complaint  Patient presents with  . Fatigue      HPI  This patient arrives today for a virtual visit for the above.  The patient was diagnosed with Covid a few weeks ago and since then has been experiencing prolonged fatigue.  He is a patient at [facility] assisted living facility.  There was a concern that he may benefit from physical therapy and/or occupational therapy.  Today I am discussing these concerns with the patient.  He tells me he does get short of breath especially with activity, does feel that he is a bit more fatigued than he was before he was diagnosed with Covid.  He also mentions to me that he does have concerns regarding his balance.  He denies any recent falls, but would like to work on balance to prevent falls.  He tells me he still feels that he is capable of going to the bathroom and bathing himself independently, however he does not bathe himself frequently.  When asked why he does not bathe frequently it is not because he has difficulty bathing himself but that he does not want to leave his wife unattended for a prolonged period of time.  Past Medical History:  Diagnosis Date  . Abdominal aortic aneurysm without rupture ([place])   .  Bilateral renal cysts   . BPH (benign prostatic hyperplasia)   . Carotid artery occlusion   . Chronic atrial fibrillation ([place])   . COPD (chronic obstructive pulmonary disease) ([place])   . Diverticulosis    Pancolonic  . Essential hypertension   . Hemorrhoids   . Hiatal hernia   . Hypercholesteremia   . Liver cyst    Benign by liver biopsy September 2013  . Nephrolithiasis   . Pre-diabetes   . Reflux esophagitis   . Sleep apnea    Uses CPAP  . Tubular adenoma       Family History  Problem Relation Age of Onset  . Breast cancer Mother   . Other Mother        Essential tremor  . Stroke Mother   . Heart disease Father        Pacemaker  . Heart attack Maternal Grandfather   . Colon cancer Neg Hx   . Liver disease Neg Hx     Social History   Social History Narrative   Married for 20 years.Retired.Now living in [place].   Social History   Tobacco Use  . Smoking status: Former Smoker    Packs/day: 0.50    Types: Cigarettes    Quit date: 1992    Years since quitting: 29.9  . Smokeless tobacco: Never Used  Substance Use Topics  . Alcohol use: No     No outpatient medications have been marked  as taking for the 12/24/19 encounter (Video Visit) with Ailene Ards, NP.    ROS:  Review of Systems  Constitutional: Positive for malaise/fatigue. Negative for fever.  Respiratory: Positive for shortness of breath.   Cardiovascular: Negative for chest pain.  Gastrointestinal: Negative for blood in stool.  Neurological: Negative for dizziness and headaches.     Objective:   Today's Vitals: There were no vitals taken for this visit. Vitals with BMI 10/27/2019 07/28/2019 05/27/2019  Height 5\' 9"  5\' 9"  -  Weight 241 lbs 239 lbs 13 oz -  BMI 23.53 61.4 -  Systolic 431 540 086  Diastolic 64 70 78  Pulse 71 51 -     Physical Exam Comprehensive physical exam not conducted today as office visit was conducted remotely.  Patient sounded well over  the phone, he appeared to be alert and oriented.  He answers questions appropriately.  He appeared to have appropriate thought content and thought processes.      Assessment and Plan   1. Fatigue, unspecified type   2. SOB (shortness of breath)   3. Balance disorder      Plan: 1.-3.  I do believe the patient would benefit from working with physical therapy and occupational therapy to help work on his endurance and balance.  We will order these today and notify his assisted living facility.   Tests ordered No orders of the defined types were placed in this encounter.     No orders of the defined types were placed in this encounter.   Patient to follow-up as scheduled or sooner as needed. This telephone conversation lasted for 8 minutes.   Ailene Ards, NP

## 2019-12-28 ENCOUNTER — Telehealth (INDEPENDENT_AMBULATORY_CARE_PROVIDER_SITE_OTHER): Payer: Self-pay

## 2019-12-28 ENCOUNTER — Other Ambulatory Visit (INDEPENDENT_AMBULATORY_CARE_PROVIDER_SITE_OTHER): Payer: Self-pay | Admitting: Nurse Practitioner

## 2019-12-28 ENCOUNTER — Encounter: Payer: Self-pay | Admitting: Internal Medicine

## 2019-12-28 DIAGNOSIS — R2689 Other abnormalities of gait and mobility: Secondary | ICD-10-CM

## 2019-12-28 DIAGNOSIS — R059 Cough, unspecified: Secondary | ICD-10-CM | POA: Diagnosis not present

## 2019-12-28 DIAGNOSIS — R0989 Other specified symptoms and signs involving the circulatory and respiratory systems: Secondary | ICD-10-CM | POA: Diagnosis not present

## 2019-12-29 NOTE — Telephone Encounter (Signed)
I just faxed to [facility], and I will mail a copy to patients address for there records.

## 2020-01-04 ENCOUNTER — Telehealth (INDEPENDENT_AMBULATORY_CARE_PROVIDER_SITE_OTHER): Payer: Self-pay

## 2020-01-04 DIAGNOSIS — R2689 Other abnormalities of gait and mobility: Secondary | ICD-10-CM

## 2020-01-04 NOTE — Telephone Encounter (Signed)
Lovena Le @ [facility] and for a order for a Rolator to be faxed to  [phone]. Son called left several order & messages on the phone over weekend and today.

## 2020-01-05 ENCOUNTER — Other Ambulatory Visit (INDEPENDENT_AMBULATORY_CARE_PROVIDER_SITE_OTHER): Payer: Self-pay | Admitting: Internal Medicine

## 2020-01-05 ENCOUNTER — Telehealth (INDEPENDENT_AMBULATORY_CARE_PROVIDER_SITE_OTHER): Payer: Self-pay

## 2020-01-05 DIAGNOSIS — R2689 Other abnormalities of gait and mobility: Secondary | ICD-10-CM

## 2020-01-06 DIAGNOSIS — R5383 Other fatigue: Secondary | ICD-10-CM | POA: Diagnosis not present

## 2020-01-06 DIAGNOSIS — J449 Chronic obstructive pulmonary disease, unspecified: Secondary | ICD-10-CM | POA: Diagnosis not present

## 2020-01-06 NOTE — Telephone Encounter (Signed)
All orders , LOV was sent over to [facility]. Son  & nurse request for information to be sent.

## 2020-01-11 ENCOUNTER — Telehealth: Payer: Self-pay | Admitting: *Deleted

## 2020-01-11 NOTE — Telephone Encounter (Signed)
Jenny Reichmann -daughter called stating that she needs to start bringing her father back to [place].  Please call to verify and patient needs to be set up.   [phone].

## 2020-01-11 NOTE — Telephone Encounter (Signed)
Spoke with daughter Jenny Reichmann.  Pt had been in assisted living facility and warfarin was managed there but pt is now in independent living and needs to start coming back her for INR checks.  Pt administered medications independently.  INR appt made for 12/29 at 2:15pm and daughter in agreement.

## 2020-01-14 ENCOUNTER — Other Ambulatory Visit (INDEPENDENT_AMBULATORY_CARE_PROVIDER_SITE_OTHER): Payer: Self-pay | Admitting: Internal Medicine

## 2020-01-20 ENCOUNTER — Ambulatory Visit (INDEPENDENT_AMBULATORY_CARE_PROVIDER_SITE_OTHER): Payer: Medicare PPO | Admitting: Pharmacist

## 2020-01-20 ENCOUNTER — Other Ambulatory Visit: Payer: Self-pay

## 2020-01-20 DIAGNOSIS — I482 Chronic atrial fibrillation, unspecified: Secondary | ICD-10-CM | POA: Diagnosis not present

## 2020-01-20 DIAGNOSIS — Z5181 Encounter for therapeutic drug level monitoring: Secondary | ICD-10-CM | POA: Diagnosis not present

## 2020-01-20 LAB — POCT INR: INR: 3.1 — AB (ref 2.0–3.0)

## 2020-01-20 NOTE — Patient Instructions (Addendum)
Description   Take 1/2 tablet today and then continue warfarin 5 mg daily. Recheck in 4 weeks.

## 2020-01-25 NOTE — Progress Notes (Unsigned)
Cardiology Office Note  Date: 01/26/2020   ID: Tristan Lopez, DOB February 13, 1930, MRN [id number]  PCP:  Tristan Singer, MD  Cardiologist:  Nona Dell, MD Electrophysiologist:  None   Chief Complaint  Patient presents with  . Cardiac follow-up    History of Present Illness: Tristan Lopez is an 87 y.o. male last seen in February 2021.  He is here today with his son for a follow-up visit.  He is now living in an independent living facility in [place], his wife lives in the memory unit at the same facility.  He uses a rolling walker to get around, has chronic hip and knee pain, but does not report any recent falls.  He does not report any significant sense of palpitations in atrial fibrillation which is rate controlled.  He remains on Coumadin with follow-up in the anticoagulation clinic.  Abdominal ultrasound in March of last year revealed AAA size of 3.7 cm with recommended follow-up in 2 years.  Carotid Dopplers from March 2021 are noted below with plan for repeat studies this year.  I personally reviewed his ECG today which shows rate controlled atrial fibrillation with borderline low voltage in the limb leads and decreased R wave progression.  Past Medical History:  Diagnosis Date  . Abdominal aortic aneurysm without rupture (HCC)   . Bilateral renal cysts   . BPH (benign prostatic hyperplasia)   . Carotid artery occlusion   . Chronic atrial fibrillation (HCC)   . COPD (chronic obstructive pulmonary disease) (HCC)   . Diverticulosis    Pancolonic  . Essential hypertension   . Hemorrhoids   . Hiatal hernia   . Hypercholesteremia   . Liver cyst    Benign by liver biopsy September 2013  . Nephrolithiasis   . Pre-diabetes   . Reflux esophagitis   . Sleep apnea    Uses CPAP  . Tubular adenoma     Past Surgical History:  Procedure Laterality Date  . COLONOSCOPY     2003, no polyps per patient. [name]  . COLONOSCOPY  11/01/11   Dr. Jena Gauss- tubular  adenoma,suboptimal preparation, internal hemorrhoids, o/w normal rectum. pancolonic diverticulosis  . ESOPHAGOGASTRODUODENOSCOPY  11/01/11   Dr. Jena Gauss- erosive reflux esophagitis along with a patulous EG junction, hialtal hernia, antral erosions with possible area of healing ulceration- s/p bx= chronic erosive gastritis, no malignancy  . HERNIA REPAIR     x2, bilateral inguinal   . KIDNEY STONE SURGERY    . KNEE ARTHROSCOPY     X2  . NASAL ENDOSCOPY      Current Outpatient Medications  Medication Sig Dispense Refill  . amLODipine (NORVASC) 10 MG tablet TAKE ONE TABLET BY MOUTH EVERY DAY 31 tablet 6  . benazepril (LOTENSIN) 40 MG tablet TAKE 1 TABLET BY MOUTH ONCE DAILY 31 tablet 4  . Cholecalciferol (VITAMIN D-3) 1000 units CAPS Take 2,000 Units by mouth.    . furosemide (LASIX) 20 MG tablet Take 20 mg daily as needed for leg swelling 90 tablet 3  . oxybutynin (DITROPAN-XL) 10 MG 24 hr tablet     . pantoprazole (PROTONIX) 40 MG tablet TAKE 1 TABLET BY MOUTH TWICE DAILY 60 tablet 5  . potassium chloride (KLOR-CON) 10 MEQ tablet TAKE 1 TABLET BY MOUTH DAILY WHEN YOU TAKE FUROSEMIDE 31 tablet 3  . Probiotic Product (DIGESTIVE ADVANTAGE PO) Take by mouth 2 (two) times daily.     . propranolol (INDERAL) 20 MG tablet TAKE ONE TABLET BY MOUTH  DAILY 27 tablet 3  . rosuvastatin (CRESTOR) 10 MG tablet TAKE 1 TABLET BY MOUTH ONCE DAILY 31 tablet 4  . tadalafil (CIALIS) 5 MG tablet TAKE 1 TABLET (5 MG TOTAL) BY MOUTH DAILY. 28 tablet 4  . warfarin (COUMADIN) 5 MG tablet TAKE ONE TABLET BY MOUTH EVERY DAY 31 tablet 4   No current facility-administered medications for this visit.   Allergies:  Latex   ROS: No syncope.  Physical Exam: VS:  BP 118/60 (BP Location: Right Arm, Patient Position: Sitting, Cuff Size: Normal)   Pulse (!) 56   Ht 5\' 9"  (1.753 m)   Wt 234 lb 4 oz (106.3 kg)   SpO2 99%   BMI 34.59 kg/m , BMI Body mass index is 34.59 kg/m.  Wt Readings from Last 3 Encounters:   01/26/20 234 lb 4 oz (106.3 kg)  10/27/19 241 lb (109.3 kg)  07/28/19 239 lb 12.8 oz (108.8 kg)    General: Elderly male, using a rolling walker, appears comfortable at rest. HEENT: Conjunctiva and lids normal, wearing a mask. Neck: Supple, no elevated JVP or carotid bruits, no thyromegaly. Lungs: Clear to auscultation, nonlabored breathing at rest. Cardiac: Irregularly irregular, no S3, 2/6 systolic murmur, no pericardial rub. Extremities: No pitting edema.  ECG:  An ECG dated 03/10/2019 was personally reviewed today and demonstrated:  Rate controlled atrial fibrillation with low voltage and nonspecific T wave changes.  Recent Labwork: 03/05/2019: Hemoglobin 12.6; Platelets 182 07/28/2019: ALT 11; AST 14; BUN 21; Creat 0.86; Potassium 4.0; Sodium 138     Component Value Date/Time   CHOL 143 07/28/2019 1132   TRIG 78 07/28/2019 1132   HDL 42 07/28/2019 1132   CHOLHDL 3.4 07/28/2019 1132   VLDL 17 02/17/2013 0820   LDLCALC 84 07/28/2019 1132    Other Studies Reviewed Today:  Echocardiogram 04/02/2019: 1. Left ventricular ejection fraction, by estimation, is 60 to 65%. The  left ventricle has normal function. The left ventricle has no regional  wall motion abnormalities. There is severe left ventricular hypertrophy.  Left ventricular diastolic parameters  are indeterminate.  2. Right ventricular systolic function was not well visualized. The right  ventricular size is not well visualized. There is mildly elevated  pulmonary artery systolic pressure.  3. Left atrial size was severely dilated.  4. The mitral valve is normal in structure. No evidence of mitral valve  regurgitation. No evidence of mitral stenosis.  5. The aortic valve is tricuspid. Aortic valve regurgitation is not  visualized. No aortic stenosis is present.  6. Techincally difficult study.   Carotid Dopplers 04/02/2019: Summary:  Right Carotid: Velocities in the right ICA are consistent with a 60-79%          stenosis. The ECA appears >50% stenosed.   Left Carotid: Velocities in the left ICA are consistent with a 40-59%  stenosis.        The ECA appears <50% stenosed.   Vertebrals: Bilateral vertebral arteries demonstrate antegrade flow.  Subclavians: Normal flow hemodynamics were seen in bilateral subclavian        arteries.   Assessment and Plan:  1.  Permanent atrial fibrillation, CHA2DS2-VASc score of at least 3.  He is asymptomatic at this time and continues on Coumadin for stroke prophylaxis with follow-up in anticoagulation clinic.  ECG reviewed.  2.  Carotid artery disease, moderate R ICA stenosis by Dopplers in March of last year.  Repeat study pending for this March.  Continue statin therapy.  3.  Asymptomatic abdominal  aortic aneurysm measuring 3.7 cm.  4.  Essential hypertension, blood pressure is well controlled today on Lotensin and Norvasc.  Medication Adjustments/Labs and Tests Ordered: Current medicines are reviewed at length with the patient today.  Concerns regarding medicines are outlined above.   Tests Ordered: Orders Placed This Encounter  Procedures  . US Carotid Duplex Bilateral  . INR/PT  . EKG 12-Lead    Medication Changes: No orders of the defined types were placed in this encounter.   Disposition:  Follow up 1 year.  Signed, Satira Sark, MD, [facility] 01/26/2020 2:59 PM    [place] [facility] [address], [place], [place] [address] Phone: [phone]; Fax: [phone]

## 2020-01-26 ENCOUNTER — Encounter: Payer: Self-pay | Admitting: Cardiology

## 2020-01-26 ENCOUNTER — Ambulatory Visit: Payer: Medicare PPO | Admitting: Cardiology

## 2020-01-26 ENCOUNTER — Other Ambulatory Visit: Payer: Self-pay

## 2020-01-26 VITALS — BP 118/60 | HR 56 | Ht 69.0 in | Wt 234.2 lb

## 2020-01-26 DIAGNOSIS — I10 Essential (primary) hypertension: Secondary | ICD-10-CM | POA: Diagnosis not present

## 2020-01-26 DIAGNOSIS — I4821 Permanent atrial fibrillation: Secondary | ICD-10-CM

## 2020-01-26 DIAGNOSIS — I6523 Occlusion and stenosis of bilateral carotid arteries: Secondary | ICD-10-CM | POA: Diagnosis not present

## 2020-01-26 NOTE — Patient Instructions (Signed)
Medication Instructions:  °Your physician recommends that you continue on your current medications as directed. Please refer to the Current Medication list given to you today. ° °*If you need a refill on your cardiac medications before your next appointment, please call your pharmacy* ° ° °Lab Work: °NONE  ° °If you have labs (blood work) drawn today and your tests are completely normal, you will receive your results only by: °MyChart Message (if you have MyChart) OR °A paper copy in the mail °If you have any lab test that is abnormal or we need to change your treatment, we will call you to review the results. ° ° °Testing/Procedures: °Your physician has requested that you have a carotid duplex. This test is an ultrasound of the carotid arteries in your neck. It looks at blood flow through these arteries that supply the brain with blood. Allow one hour for this exam. There are no restrictions or special instructions. ° ° ° °Follow-Up: °At [facility], you and your health needs are our priority.  As part of our continuing mission to provide you with exceptional heart care, we have created designated Provider Care Teams.  These Care Teams include your primary Cardiologist (physician) and Advanced Practice Providers (APPs -  Physician Assistants and Nurse Practitioners) who all work together to provide you with the care you need, when you need it. ° °We recommend signing up for the patient portal called "MyChart".  Sign up information is provided on this After Visit Summary.  MyChart is used to connect with patients for Virtual Visits (Telemedicine).  Patients are able to view lab/test results, encounter notes, upcoming appointments, etc.  Non-urgent messages can be sent to your provider as well.   °To learn more about what you can do with MyChart, go to [email or web address].   ° °Your next appointment:   °1 year(s) ° °The format for your next appointment:   °In Person ° °Provider:   °Samuel McDowell, MD   ° ° °Other Instructions °Thank you for choosing [facility]! ° ° ° °

## 2020-02-11 ENCOUNTER — Other Ambulatory Visit (INDEPENDENT_AMBULATORY_CARE_PROVIDER_SITE_OTHER): Payer: Self-pay | Admitting: Nurse Practitioner

## 2020-02-15 ENCOUNTER — Telehealth: Payer: Self-pay | Admitting: *Deleted

## 2020-02-15 ENCOUNTER — Telehealth (INDEPENDENT_AMBULATORY_CARE_PROVIDER_SITE_OTHER): Payer: Self-pay

## 2020-02-15 NOTE — Telephone Encounter (Signed)
Called son Dominica Severin and gave him the instructions per Dr. Anastasio Champion. Dominica Severin verbalized an understanding and thanked [place].

## 2020-02-15 NOTE — Telephone Encounter (Signed)
Noted  

## 2020-02-15 NOTE — Telephone Encounter (Signed)
patient is going to transfer coumdin care to a Doctor in [place], [place] per family.

## 2020-02-15 NOTE — Telephone Encounter (Signed)
Son Dominica Severin called and stated that his dad is having teeth pulled on 02/29/2020 and he needs to know when to stop his dads Coumadin before the procedure?  Please advise.

## 2020-02-15 NOTE — Telephone Encounter (Signed)
I would recommend that he stop the Coumadin 4 days prior to the procedure.

## 2020-02-16 DIAGNOSIS — I4821 Permanent atrial fibrillation: Secondary | ICD-10-CM | POA: Diagnosis not present

## 2020-02-17 LAB — PROTIME-INR
INR: 2.5 — ABNORMAL HIGH (ref 0.9–1.2)
Prothrombin Time: 25.6 s — ABNORMAL HIGH (ref 9.1–12.0)

## 2020-02-23 ENCOUNTER — Encounter (INDEPENDENT_AMBULATORY_CARE_PROVIDER_SITE_OTHER): Payer: Medicare PPO | Admitting: Nurse Practitioner

## 2020-03-03 ENCOUNTER — Other Ambulatory Visit (INDEPENDENT_AMBULATORY_CARE_PROVIDER_SITE_OTHER): Payer: Self-pay | Admitting: Nurse Practitioner

## 2020-03-17 ENCOUNTER — Telehealth (INDEPENDENT_AMBULATORY_CARE_PROVIDER_SITE_OTHER): Payer: Self-pay | Admitting: Nurse Practitioner

## 2020-03-17 ENCOUNTER — Telehealth (INDEPENDENT_AMBULATORY_CARE_PROVIDER_SITE_OTHER): Payer: Medicare PPO | Admitting: Nurse Practitioner

## 2020-03-17 ENCOUNTER — Other Ambulatory Visit: Payer: Self-pay

## 2020-03-17 ENCOUNTER — Encounter (INDEPENDENT_AMBULATORY_CARE_PROVIDER_SITE_OTHER): Payer: Self-pay | Admitting: Nurse Practitioner

## 2020-03-17 DIAGNOSIS — Z0000 Encounter for general adult medical examination without abnormal findings: Secondary | ICD-10-CM

## 2020-03-17 NOTE — Progress Notes (Signed)
An audio-only tele-health visit was conducted today. I connected with  Tristan Lopez on 03/17/20 utilizing audio-only technology and verified that I am speaking with the correct person using two identifiers. The patient was located at their home, and I was located at the office of [facility] during the encounter. I discussed the limitations of evaluation and management by telemedicine. The patient expressed understanding and agreed to proceed.    Subjective:   Tristan Lopez is a 87 y.o. male who presents for Medicare Annual/Subsequent preventive examination.  Cardiac Risk Factors include: advanced age (>28men, >91 women);dyslipidemia;male gender;hypertension     Objective:    There were no vitals filed for this visit. There is no height or weight on file to calculate BMI.  Advanced Directives 03/17/2020 03/05/2019 02/10/2019 01/11/2018 04/03/2017 04/01/2015 12/02/2014  Does Patient Have a Medical Advance Directive? Yes Yes Yes Yes Yes Yes No;Yes  Type of Paramedic of [place];Out of facility DNR (pink MOST or yellow form) Healthcare Power of [employer] of facility DNR (pink MOST or yellow form) [place];Living will [place];Living will Living will Out of facility DNR (pink MOST or yellow form)  Does patient want to make changes to medical advance directive? No - Patient declined - No - Patient declined No - Patient declined - No - Patient declined -  Copy of [place] in Chart? No - copy requested - - - - No - copy requested No - copy requested  Would patient like information on creating a medical advance directive? - - - - - Yes - Educational materials given -  Pre-existing out of facility DNR order (yellow form or pink MOST form) - - - - - - -    Current Medications (verified) Outpatient Encounter Medications as of 03/17/2020  Medication Sig  . amLODipine (NORVASC) 10 MG tablet TAKE ONE  TABLET BY MOUTH EVERY DAY  . Ascorbic Acid (VITAMIN C) 1000 MG tablet Take 1,000 mg by mouth daily.  . benazepril (LOTENSIN) 40 MG tablet TAKE 1 TABLET BY MOUTH ONCE DAILY  . Cholecalciferol (VITAMIN D-3) 1000 units CAPS Take 2,000 Units by mouth.  . furosemide (LASIX) 20 MG tablet Take 20 mg daily as needed for leg swelling  . Garlic 7673 MG CAPS Take 1,000 mg by mouth daily.  Marland Kitchen oxybutynin (DITROPAN-XL) 10 MG 24 hr tablet TAKE 1 TABLET BY MOUTH ONCE DAILY  . pantoprazole (PROTONIX) 40 MG tablet TAKE 1 TABLET BY MOUTH TWICE DAILY  . potassium chloride (KLOR-CON) 10 MEQ tablet TAKE 1 TABLET BY MOUTH DAILY WHEN YOU TAKE FUROSEMIDE  . Probiotic Product (DIGESTIVE ADVANTAGE PO) Take by mouth 2 (two) times daily.   . propranolol (INDERAL) 20 MG tablet TAKE ONE TABLET BY MOUTH DAILY  . rosuvastatin (CRESTOR) 10 MG tablet TAKE 1 TABLET BY MOUTH ONCE DAILY  . tadalafil (CIALIS) 5 MG tablet TAKE 1 TABLET (5 MG TOTAL) BY MOUTH DAILY.  Marland Kitchen warfarin (COUMADIN) 5 MG tablet TAKE ONE TABLET BY MOUTH EVERY DAY   No facility-administered encounter medications on file as of 03/17/2020.    Allergies (verified) Latex   History: Past Medical History:  Diagnosis Date  . Abdominal aortic aneurysm without rupture ([place])   . Bilateral renal cysts   . BPH (benign prostatic hyperplasia)   . Carotid artery occlusion   . Chronic atrial fibrillation ([place])   . COPD (chronic obstructive pulmonary disease) ([place])   . Diverticulosis  Pancolonic  . Essential hypertension   . Hemorrhoids   . Hiatal hernia   . Hypercholesteremia   . Liver cyst    Benign by liver biopsy September 2013  . Nephrolithiasis   . Pre-diabetes   . Reflux esophagitis   . Sleep apnea    Uses CPAP  . Tubular adenoma    Past Surgical History:  Procedure Laterality Date  . COLONOSCOPY     2003, no polyps per patient. Dr. [place] Carbo  . COLONOSCOPY  11/01/11   Dr. Gala Romney- tubular adenoma,suboptimal preparation, internal hemorrhoids, o/w  normal rectum. pancolonic diverticulosis  . ESOPHAGOGASTRODUODENOSCOPY  11/01/11   Dr. Gala Romney- erosive reflux esophagitis along with a patulous EG junction, hialtal hernia, antral erosions with possible area of healing ulceration- s/p bx= chronic erosive gastritis, no malignancy  . HERNIA REPAIR     x2, bilateral inguinal   . KIDNEY STONE SURGERY    . KNEE ARTHROSCOPY     X2  . NASAL ENDOSCOPY     Family History  Problem Relation Age of Onset  . Breast cancer Mother   . Other Mother        Essential tremor  . Stroke Mother   . Heart disease Father        Pacemaker  . Heart attack Maternal Grandfather   . Colon cancer Neg Hx   . Liver disease Neg Hx    Social History   Socioeconomic History  . Marital status: Married    Spouse name: Not on file  . Number of children: 3  . Years of education: Not on file  . Highest education level: Not on file  Occupational History    Employer: RETIRED  Tobacco Use  . Smoking status: Former Smoker    Packs/day: 0.50    Types: Cigarettes    Quit date: 1992    Years since quitting: 30.1  . Smokeless tobacco: Never Used  Vaping Use  . Vaping Use: Never used  Substance and Sexual Activity  . Alcohol use: No  . Drug use: No  . Sexual activity: Not on file  Other Topics Concern  . Not on file  Social History Narrative   Married for 68 years.Retired.Now living in [place].   Social Determinants of Health   Financial Resource Strain: Not on file  Food Insecurity: Not on file  Transportation Needs: Not on file  Physical Activity: Not on file  Stress: Not on file  Social Connections: Not on file    Tobacco Counseling Counseling given: Yes   Clinical Intake:  Pre-visit preparation completed: Yes  Pain : No/denies pain     BMI - recorded:  (Unable to determine) Nutritional Risks: None Diabetes: No  How often do you need to have someone help you when you read instructions, pamphlets, or other  written materials from your doctor or pharmacy?: 1 - Never What is the last grade level you completed in school?: 12th Grade  Diabetic? No  Interpreter Needed?: No  Information entered by :: Jeralyn Ruths, NP-C   Activities of Daily Living In your present state of health, do you have any difficulty performing the following activities: 03/17/2020  Hearing? Y  Vision? N  Difficulty concentrating or making decisions? Y  Walking or climbing stairs? N  Dressing or bathing? N  Doing errands, shopping? Y  Preparing Food and eating ? N  Using the Toilet? N  In the past six months, have you accidently leaked urine? Y  Do you have  problems with loss of bowel control? Y  Managing your Medications? N  Managing your Finances? N  Housekeeping or managing your Housekeeping? N  Some recent data might be hidden    Patient Care Team: [name], MD as PCP - General (Internal Medicine) Satira Sark, MD as PCP - Cardiology (Cardiology) Gala Romney Cristopher Estimable, MD (Gastroenterology)  Indicate any recent Medical Services you may have received from other than [facility] providers in the past year (date may be approximate).     Assessment:   This is a routine wellness examination for [place].  Hearing/Vision screen No exam data present  Dietary issues and exercise activities discussed: Current Exercise Habits: Home exercise routine, Type of exercise: strength training/weights, Time (Minutes): 30, Frequency (Times/Week): 3, Weekly Exercise (Minutes/Week): 90, Exercise limited by: cardiac condition(s);orthopedic condition(s)  Goals    . DIET - EAT MORE FRUITS AND VEGETABLES     Try to eat a light healthy dinner daily.      Depression Screen PHQ 2/9 Scores 03/17/2020 04/21/2019 02/10/2019  PHQ - 2 Score 0 0 0  Exception Documentation - Medical reason -    Fall Risk Fall Risk  03/17/2020 04/21/2019 02/10/2019  Falls in the past year? 0 0 1  Number falls in past yr: 0 0 1  Injury with Fall? 0 0 0   Risk for fall due to : Impaired mobility;Impaired balance/gait No Fall Risks History of fall(s);Impaired balance/gait  Follow up Education provided;Falls prevention discussed;Falls evaluation completed Falls evaluation completed Falls prevention discussed;Education provided    FALL RISK PREVENTION PERTAINING TO THE HOME:  Any stairs in or around the home? No  If so, are there any without handrails? No  Home free of loose throw rugs in walkways, pet beds, electrical cords, etc? No  Adequate lighting in your home to reduce risk of falls? Yes   ASSISTIVE DEVICES UTILIZED TO PREVENT FALLS:  Life alert? Yes  Use of a cane, walker or w/c? Yes  Grab bars in the bathroom? Yes  Shower chair or bench in shower? Yes  Elevated toilet seat or a handicapped toilet? Yes   TIMED UP AND GO:  Was the test performed? No . - Virtual over the phone     Cognitive Function:     6CIT Screen 03/17/2020 02/10/2019  What Year? 0 points 0 points  What month? 0 points 0 points  What time? 0 points 0 points  Count back from 20 0 points 0 points  Months in reverse 2 points 0 points  Repeat phrase 6 points 8 points  Total Score 8 8    Immunizations Immunization History  Administered Date(s) Administered  . DTaP 12/10/2016  . Fluad Quad(high Dose 65+) 11/26/2018  . Influenza,inj,Quad PF,6+ Mos 11/23/2018  . Influenza-Unspecified 10/29/2014  . Moderna Sars-Covid-2 Vaccination 02/21/2019, 03/12/2019  . Pneumococcal Conjugate-13 10/29/2014, 12/26/2015  . Pneumococcal Polysaccharide-23 12/16/2015  . Pneumococcal-Unspecified 10/29/2014  . Zoster 08/20/2017  . Zoster Recombinat (Shingrix) 08/21/2017, 11/12/2017    TDAP status: Up to date  Flu Vaccine status: Up to date  Pneumococcal vaccine status: Completed during today's visit.  Covid-19 vaccine status: Completed vaccines  Qualifies for Shingles Vaccine? No   Zostavax completed Yes   Shingrix Completed?: Yes  Screening Tests Health  Maintenance  Topic Date Due  . TETANUS/TDAP  Never done  . COVID-19 Vaccine (3 - Inadvertent risk 4-dose series) 04/09/2019  . INFLUENZA VACCINE  08/23/2019  . PNA vac Low Risk Adult  Completed    Health  Maintenance  Health Maintenance Due  Topic Date Due  . TETANUS/TDAP  Never done  . COVID-19 Vaccine (3 - Inadvertent risk 4-dose series) 04/09/2019  . INFLUENZA VACCINE  08/23/2019    Colorectal cancer screening: No longer required.   Lung Cancer Screening: (Low Dose CT Chest recommended if Age 74-80 years, 30 pack-year currently smoking OR have quit w/in 15years.) does not qualify.   Additional Screening:  Hepatitis C Screening: does qualify; Consider for next appointment  Vision Screening: Recommended annual ophthalmology exams for early detection of glaucoma and other disorders of the eye. Is the patient up to date with their annual eye exam?  No  Who is the provider or what is the name of the office in which the patient attends annual eye exams? N/A If pt is not established with a provider, would they like to be referred to a provider to establish care? No .   Dental Screening: Recommended annual dental exams for proper oral hygiene  Community Resource Referral / Chronic Care Management: CRR required this visit?  No   CCM required this visit?  No      Plan:     I have personally reviewed and noted the following in the patient's chart:   . Medical and social history . Use of alcohol, tobacco or illicit drugs  . Current medications and supplements . Functional ability and status . Nutritional status . Physical activity . Advanced directives . List of other physicians . Hospitalizations, surgeries, and ER visits in previous 12 months . Vitals . Screenings to include cognitive, depression, and falls . Referrals and appointments  In addition, I have reviewed and discussed with patient certain preventive protocols, quality metrics, and best practice  recommendations. A written personalized care plan for preventive services as well as general preventive health recommendations were provided to patient.    Patient will follow up in 2 months as currently scheduled and will schedule his annual wellness visit again next year.  Ailene Ards, NP   03/17/2020

## 2020-03-17 NOTE — Telephone Encounter (Signed)
Please print out after visit summary for patient's office visit on 03/17/2020 and mailed to his home.  Please call patient or his family member to verify his current address as he told me he recently moved when I spoke to him at his visit.  Thank you.

## 2020-03-17 NOTE — Patient Instructions (Signed)
  [name] , Thank you for taking time to come for your Medicare Wellness Visit. I appreciate your ongoing commitment to your health goals. Please review the following plan we discussed and let me know if I can assist you in the future.   These are the goals we discussed: Goals    . DIET - EAT MORE FRUITS AND VEGETABLES     Try to eat a light healthy dinner daily.       This is a list of the screening recommended for you and due dates:  Health Maintenance  Topic Date Due  . Tetanus Vaccine  Never done  . COVID-19 Vaccine (3 - Inadvertent risk 4-dose series) 04/09/2019  . Flu Shot  Completed  . Pneumonia vaccines  Completed

## 2020-03-21 NOTE — Telephone Encounter (Signed)
Done

## 2020-04-05 ENCOUNTER — Other Ambulatory Visit (INDEPENDENT_AMBULATORY_CARE_PROVIDER_SITE_OTHER): Payer: Self-pay | Admitting: Internal Medicine

## 2020-04-05 ENCOUNTER — Telehealth: Payer: Self-pay | Admitting: Cardiology

## 2020-04-05 ENCOUNTER — Other Ambulatory Visit (INDEPENDENT_AMBULATORY_CARE_PROVIDER_SITE_OTHER): Payer: Self-pay | Admitting: Nurse Practitioner

## 2020-04-05 DIAGNOSIS — I4821 Permanent atrial fibrillation: Secondary | ICD-10-CM

## 2020-04-05 DIAGNOSIS — Z5181 Encounter for therapeutic drug level monitoring: Secondary | ICD-10-CM

## 2020-04-05 DIAGNOSIS — I10 Essential (primary) hypertension: Secondary | ICD-10-CM

## 2020-04-05 NOTE — Telephone Encounter (Signed)
Lab order faxed to [facility] in [place], [place].  [phone]

## 2020-04-05 NOTE — Telephone Encounter (Signed)
New message    Needs a standing order sent to [facility] in [place] for the patients coumadin , he is in a facility in [place]

## 2020-04-09 ENCOUNTER — Other Ambulatory Visit (INDEPENDENT_AMBULATORY_CARE_PROVIDER_SITE_OTHER): Payer: Self-pay | Admitting: Nurse Practitioner

## 2020-04-09 DIAGNOSIS — I10 Essential (primary) hypertension: Secondary | ICD-10-CM

## 2020-04-15 DIAGNOSIS — Z5181 Encounter for therapeutic drug level monitoring: Secondary | ICD-10-CM | POA: Diagnosis not present

## 2020-04-15 DIAGNOSIS — I4821 Permanent atrial fibrillation: Secondary | ICD-10-CM | POA: Diagnosis not present

## 2020-04-15 LAB — POCT INR: INR: 2.6 (ref 2.0–3.0)

## 2020-04-16 LAB — PROTIME-INR
INR: 2.6 — ABNORMAL HIGH (ref 0.9–1.2)
Prothrombin Time: 26.1 s — ABNORMAL HIGH (ref 9.1–12.0)

## 2020-04-18 ENCOUNTER — Ambulatory Visit (INDEPENDENT_AMBULATORY_CARE_PROVIDER_SITE_OTHER): Payer: Medicare PPO | Admitting: *Deleted

## 2020-04-18 DIAGNOSIS — Z5181 Encounter for therapeutic drug level monitoring: Secondary | ICD-10-CM

## 2020-04-18 DIAGNOSIS — I4821 Permanent atrial fibrillation: Secondary | ICD-10-CM

## 2020-04-18 NOTE — Patient Instructions (Signed)
Pt is in independent living facility in [place].  Get labs done at [facility] on [address].  Son Dominica Severin manages pts medicine.  Lab orders are mailed to [facility] address. Continue warfarin 5 mg daily. Recheck in 6 weeks. Instructions given to son Salli Real order mailed to [place].

## 2020-04-21 ENCOUNTER — Other Ambulatory Visit: Payer: Self-pay | Admitting: Cardiology

## 2020-04-21 DIAGNOSIS — I6523 Occlusion and stenosis of bilateral carotid arteries: Secondary | ICD-10-CM

## 2020-05-11 ENCOUNTER — Ambulatory Visit (INDEPENDENT_AMBULATORY_CARE_PROVIDER_SITE_OTHER): Payer: Medicare PPO | Admitting: Internal Medicine

## 2020-05-11 ENCOUNTER — Other Ambulatory Visit: Payer: Self-pay

## 2020-05-11 ENCOUNTER — Other Ambulatory Visit: Payer: Self-pay | Admitting: Cardiology

## 2020-05-11 ENCOUNTER — Encounter (INDEPENDENT_AMBULATORY_CARE_PROVIDER_SITE_OTHER): Payer: Self-pay | Admitting: Internal Medicine

## 2020-05-11 ENCOUNTER — Ambulatory Visit (INDEPENDENT_AMBULATORY_CARE_PROVIDER_SITE_OTHER): Payer: Medicare PPO

## 2020-05-11 VITALS — BP 128/71 | HR 77 | Temp 97.5°F | Resp 18 | Ht 69.0 in | Wt 234.8 lb

## 2020-05-11 DIAGNOSIS — E559 Vitamin D deficiency, unspecified: Secondary | ICD-10-CM | POA: Diagnosis not present

## 2020-05-11 DIAGNOSIS — I6523 Occlusion and stenosis of bilateral carotid arteries: Secondary | ICD-10-CM

## 2020-05-11 DIAGNOSIS — E785 Hyperlipidemia, unspecified: Secondary | ICD-10-CM

## 2020-05-11 DIAGNOSIS — I10 Essential (primary) hypertension: Secondary | ICD-10-CM | POA: Diagnosis not present

## 2020-05-11 NOTE — Progress Notes (Signed)
Metrics: Intervention Frequency ACO  Documented Smoking Status Yearly  Screened one or more times in 24 months  Cessation Counseling or  Active cessation medication Past 24 months  Past 24 months   Guideline developer: UpToDate (See UpToDate for funding source) Date Released: 2014       Wellness Office Visit  Subjective:  Patient ID: Tristan Lopez, male    DOB: 06/25/1930  Age: 87 y.o. MRN: [id number]  CC: This man comes in for follow-up of hypertension, hyperlipidemia, vitamin D deficiency. HPI  He is doing reasonably well.  He continues on propranolol which also is being used for his essential tremor. He does not take any medications for his hyperlipidemia.  He does have chronic atrial fibrillation and is on Coumadin therapy and his last INR was therapeutic. He has no complaints today. He is taking vitamin D3 supplementation. Past Medical History:  Diagnosis Date  . Abdominal aortic aneurysm without rupture ([place])   . Bilateral renal cysts   . BPH (benign prostatic hyperplasia)   . Carotid artery occlusion   . Chronic atrial fibrillation ([place])   . COPD (chronic obstructive pulmonary disease) ([place])   . Diverticulosis    Pancolonic  . Essential hypertension   . Hemorrhoids   . Hiatal hernia   . Hypercholesteremia   . Liver cyst    Benign by liver biopsy September 2013  . Nephrolithiasis   . Pre-diabetes   . Reflux esophagitis   . Sleep apnea    Uses CPAP  . Tubular adenoma    Past Surgical History:  Procedure Laterality Date  . COLONOSCOPY     2003, no polyps per patient. Dr. West Carbo  . COLONOSCOPY  11/01/11   Dr. Gala Romney- tubular adenoma,suboptimal preparation, internal hemorrhoids, o/w normal rectum. pancolonic diverticulosis  . ESOPHAGOGASTRODUODENOSCOPY  11/01/11   Dr. Gala Romney- erosive reflux esophagitis along with a patulous EG junction, hialtal hernia, antral erosions with possible area of healing ulceration- s/p bx= chronic erosive gastritis, no malignancy   . HERNIA REPAIR     x2, bilateral inguinal   . KIDNEY STONE SURGERY    . KNEE ARTHROSCOPY     X2  . NASAL ENDOSCOPY       Family History  Problem Relation Age of Onset  . Breast cancer Mother   . Other Mother        Essential tremor  . Stroke Mother   . Heart disease Father        Pacemaker  . Heart attack Maternal Grandfather   . Colon cancer Neg Hx   . Liver disease Neg Hx     Social History   Social History Narrative   Married for 71 years.Retired.Now living in [place].   Social History   Tobacco Use  . Smoking status: Former Smoker    Packs/day: 0.50    Types: Cigarettes    Quit date: 1992    Years since quitting: 30.3  . Smokeless tobacco: Never Used  Substance Use Topics  . Alcohol use: No    Current Meds  Medication Sig  . Ascorbic Acid (VITAMIN C) 1000 MG tablet Take 1,000 mg by mouth daily.  . Cholecalciferol (VITAMIN D-3) 1000 units CAPS Take 2,000 Units by mouth.  . furosemide (LASIX) 20 MG tablet Take 20 mg daily as needed for leg swelling  . Garlic 9470 MG CAPS Take 1,000 mg by mouth daily.  Marland Kitchen oxybutynin (DITROPAN-XL) 10 MG 24 hr tablet TAKE 1 TABLET BY MOUTH ONCE DAILY  .  potassium chloride (KLOR-CON) 10 MEQ tablet TAKE 1 TABLET BY MOUTH DAILY WHEN YOU TAKE FUROSEMIDE  . Probiotic Product (DIGESTIVE ADVANTAGE PO) Take by mouth 2 (two) times daily.   . propranolol (INDERAL) 20 MG tablet TAKE ONE TABLET BY MOUTH DAILY  . warfarin (COUMADIN) 5 MG tablet TAKE ONE TABLET BY MOUTH EVERY DAY       Objective:   Today's Vitals: BP 128/71 (BP Location: Right Arm, Patient Position: Sitting, Cuff Size: Normal)   Pulse 77   Temp (!) 97.5 F (36.4 C) (Temporal)   Resp 18   Ht 5\' 9"  (1.753 m)   Wt 234 lb 12.8 oz (106.5 kg)   SpO2 98%   BMI 34.67 kg/m  Vitals with BMI 05/11/2020 01/26/2020 12/24/2019  Height 5\' 9"  5\' 9"  -  Weight 234 lbs 13 oz 234 lbs 4 oz -  BMI 24.40 10.27 -  Systolic 253 664 (No Data)  Diastolic 71 60  (No Data)  Pulse 77 56 -     Physical Exam  He looks systemically well, remains obese.  Blood pressure is in a good range.  No new physical findings.     Assessment   1. Vitamin D deficiency   2. Essential hypertension   3. Hyperlipidemia, unspecified hyperlipidemia type       Tests ordered Orders Placed This Encounter  Procedures  . CBC  . COMPLETE METABOLIC PANEL WITH GFR  . VITAMIN D 25 Hydroxy (Vit-D Deficiency, Fractures)     Plan: 1. Continue with vitamin D3 supplementation and we will check vitamin D levels. 2. Continue with propranolol.  Check renal function. 3. Further recommendations will depend on blood results and he will see [name] in about 6 months time for follow-up.  Today he was given Tdap vaccine.   No orders of the defined types were placed in this encounter.   [name], MD

## 2020-05-12 LAB — COMPLETE METABOLIC PANEL WITH GFR
AG Ratio: 1.6 (calc) (ref 1.0–2.5)
ALT: 7 U/L — ABNORMAL LOW (ref 9–46)
AST: 17 U/L (ref 10–35)
Albumin: 4 g/dL (ref 3.6–5.1)
Alkaline phosphatase (APISO): 55 U/L (ref 35–144)
BUN: 18 mg/dL (ref 7–25)
CO2: 29 mmol/L (ref 20–32)
Calcium: 9.2 mg/dL (ref 8.6–10.3)
Chloride: 102 mmol/L (ref 98–110)
Creat: 0.83 mg/dL (ref 0.70–1.11)
GFR, Est African American: 92 mL/min/{1.73_m2} (ref >=60)
GFR, Est Non African American: 79 mL/min/{1.73_m2} (ref >=60)
Globulin: 2.5 g/dL (calc) (ref 1.9–3.7)
Glucose, Bld: 91 mg/dL (ref 65–99)
Potassium: 4.1 mmol/L (ref 3.5–5.3)
Sodium: 139 mmol/L (ref 135–146)
Total Bilirubin: 0.8 mg/dL (ref 0.2–1.2)
Total Protein: 6.5 g/dL (ref 6.1–8.1)

## 2020-05-12 LAB — CBC
HCT: 37.8 % — ABNORMAL LOW (ref 38.5–50.0)
Hemoglobin: 12.4 g/dL — ABNORMAL LOW (ref 13.2–17.1)
MCH: 30.1 pg (ref 27.0–33.0)
MCHC: 32.8 g/dL (ref 32.0–36.0)
MCV: 91.7 fL (ref 80.0–100.0)
MPV: 9.6 fL (ref 7.5–12.5)
Platelets: 177 10*3/uL (ref 140–400)
RBC: 4.12 10*6/uL — ABNORMAL LOW (ref 4.20–5.80)
RDW: 12.8 % (ref 11.0–15.0)
WBC: 4 10*3/uL (ref 3.8–10.8)

## 2020-05-12 LAB — VITAMIN D 25 HYDROXY (VIT D DEFICIENCY, FRACTURES): Vit D, 25-Hydroxy: 41 ng/mL (ref 30–100)

## 2020-06-06 ENCOUNTER — Emergency Department (HOSPITAL_COMMUNITY)
Admission: EM | Admit: 2020-06-06 | Discharge: 2020-06-06 | Disposition: A | Discharge status: Home / Self Care | Payer: Medicare PPO | Attending: Emergency Medicine | Admitting: Emergency Medicine

## 2020-06-06 ENCOUNTER — Encounter (HOSPITAL_COMMUNITY): Payer: Self-pay | Admitting: Emergency Medicine

## 2020-06-06 ENCOUNTER — Other Ambulatory Visit (INDEPENDENT_AMBULATORY_CARE_PROVIDER_SITE_OTHER): Payer: Self-pay | Admitting: Nurse Practitioner

## 2020-06-06 ENCOUNTER — Emergency Department (HOSPITAL_COMMUNITY): Payer: Medicare PPO

## 2020-06-06 ENCOUNTER — Other Ambulatory Visit: Payer: Self-pay

## 2020-06-06 DIAGNOSIS — J449 Chronic obstructive pulmonary disease, unspecified: Secondary | ICD-10-CM | POA: Diagnosis not present

## 2020-06-06 DIAGNOSIS — Z7901 Long term (current) use of anticoagulants: Secondary | ICD-10-CM | POA: Diagnosis not present

## 2020-06-06 DIAGNOSIS — I714 Abdominal aortic aneurysm, without rupture: Secondary | ICD-10-CM | POA: Diagnosis not present

## 2020-06-06 DIAGNOSIS — I10 Essential (primary) hypertension: Secondary | ICD-10-CM | POA: Diagnosis not present

## 2020-06-06 DIAGNOSIS — Z91040 Latex allergy status: Secondary | ICD-10-CM | POA: Insufficient documentation

## 2020-06-06 DIAGNOSIS — K5730 Diverticulosis of large intestine without perforation or abscess without bleeding: Secondary | ICD-10-CM | POA: Diagnosis not present

## 2020-06-06 DIAGNOSIS — Z79899 Other long term (current) drug therapy: Secondary | ICD-10-CM | POA: Diagnosis not present

## 2020-06-06 DIAGNOSIS — I4891 Unspecified atrial fibrillation: Secondary | ICD-10-CM | POA: Insufficient documentation

## 2020-06-06 DIAGNOSIS — R109 Unspecified abdominal pain: Secondary | ICD-10-CM | POA: Diagnosis not present

## 2020-06-06 DIAGNOSIS — M5459 Other low back pain: Secondary | ICD-10-CM | POA: Diagnosis not present

## 2020-06-06 DIAGNOSIS — Z87891 Personal history of nicotine dependence: Secondary | ICD-10-CM | POA: Insufficient documentation

## 2020-06-06 DIAGNOSIS — M545 Low back pain, unspecified: Secondary | ICD-10-CM | POA: Diagnosis not present

## 2020-06-06 DIAGNOSIS — N281 Cyst of kidney, acquired: Secondary | ICD-10-CM | POA: Diagnosis not present

## 2020-06-06 LAB — CBC WITH DIFFERENTIAL/PLATELET
Abs Immature Granulocytes: 0.01 10*3/uL (ref 0.00–0.07)
Basophils Absolute: 0 10*3/uL (ref 0.0–0.1)
Basophils Relative: 0 %
Eosinophils Absolute: 0.1 10*3/uL (ref 0.0–0.5)
Eosinophils Relative: 1 %
HCT: 39.4 % (ref 39.0–52.0)
Hemoglobin: 12.3 g/dL — ABNORMAL LOW (ref 13.0–17.0)
Immature Granulocytes: 0 %
Lymphocytes Relative: 24 %
Lymphs Abs: 1.1 10*3/uL (ref 0.7–4.0)
MCH: 30.1 pg (ref 26.0–34.0)
MCHC: 31.2 g/dL (ref 30.0–36.0)
MCV: 96.3 fL (ref 80.0–100.0)
Monocytes Absolute: 0.5 10*3/uL (ref 0.1–1.0)
Monocytes Relative: 11 %
Neutro Abs: 3.1 10*3/uL (ref 1.7–7.7)
Neutrophils Relative %: 64 %
Platelets: 178 10*3/uL (ref 150–400)
RBC: 4.09 MIL/uL — ABNORMAL LOW (ref 4.22–5.81)
RDW: 14 % (ref 11.5–15.5)
WBC: 4.8 10*3/uL (ref 4.0–10.5)
nRBC: 0 % (ref 0.0–0.2)

## 2020-06-06 LAB — PROTIME-INR
INR: 2.4 — ABNORMAL HIGH (ref 0.8–1.2)
Prothrombin Time: 26.1 seconds — ABNORMAL HIGH (ref 11.4–15.2)

## 2020-06-06 LAB — BASIC METABOLIC PANEL
Anion gap: 8 (ref 5–15)
BUN: 22 mg/dL (ref 8–23)
CO2: 27 mmol/L (ref 22–32)
Calcium: 9.4 mg/dL (ref 8.9–10.3)
Chloride: 102 mmol/L (ref 98–111)
Creatinine, Ser: 0.91 mg/dL (ref 0.61–1.24)
GFR, Estimated: >60 mL/min (ref >60)
Glucose, Bld: 105 mg/dL — ABNORMAL HIGH (ref 70–99)
Potassium: 4.1 mmol/L (ref 3.5–5.1)
Sodium: 137 mmol/L (ref 135–145)

## 2020-06-06 MED ORDER — ONDANSETRON HCL 4 MG/2ML IJ SOLN
4.0000 mg | Freq: Once | INTRAMUSCULAR | Status: AC
  Administered 2020-06-06: 4 mg via INTRAVENOUS
  Filled 2020-06-06: qty 2

## 2020-06-06 MED ORDER — MORPHINE SULFATE (PF) 2 MG/ML IV SOLN
2.0000 mg | Freq: Once | INTRAVENOUS | Status: AC
  Administered 2020-06-06: 2 mg via INTRAVENOUS
  Filled 2020-06-06: qty 1

## 2020-06-06 MED ORDER — OXYCODONE-ACETAMINOPHEN 5-325 MG PO TABS: 1.0000 | ORAL_TABLET | Freq: Four times a day (QID) | ORAL | 0 refills | Status: DC | PRN

## 2020-06-06 MED ORDER — TIZANIDINE HCL 4 MG PO TABS: 2.0000 mg | ORAL_TABLET | Freq: Four times a day (QID) | ORAL | 0 refills | Status: DC | PRN

## 2020-06-06 MED ORDER — OXYCODONE-ACETAMINOPHEN 5-325 MG PO TABS
1.0000 | ORAL_TABLET | Freq: Once | ORAL | Status: AC
  Administered 2020-06-06: 1 via ORAL
  Filled 2020-06-06: qty 1

## 2020-06-06 MED ORDER — IOHEXOL 350 MG/ML SOLN
100.0000 mL | Freq: Once | INTRAVENOUS | Status: AC | PRN
  Administered 2020-06-06: 100 mL via INTRAVENOUS

## 2020-06-06 NOTE — ED Triage Notes (Signed)
C/o lower left back pain, rating pain 10/10.  Pain has gotten progressive worse over last month. Pt took two pain pills at 4 am, but not sure of the name of pills.  Denies injury.

## 2020-06-06 NOTE — ED Provider Notes (Signed)
[facility] EMERGENCY DEPARTMENT Provider Note   CSN: [id number] Arrival date & time: 06/06/20  0755     History Chief Complaint  Patient presents with  . Back Pain    Tristan Lopez is a 87 y.o. male.  Patient presents chief complaint of left-sided lower back pain.  He states he noticed it about a month ago and has been more persistent over the course of the last few days.  Is been taking Tylenol at home without improvement.  Denies any fall or other trauma but has just been having progressive lower back pain over the course of the month.  Denies fevers or cough or vomiting or diarrhea.  Has been using his walker more frequently now.  Denies any new numbness or weakness or difficulty urinating.        Past Medical History:  Diagnosis Date  . Abdominal aortic aneurysm without rupture ([place])   . Bilateral renal cysts   . BPH (benign prostatic hyperplasia)   . Carotid artery occlusion   . Chronic atrial fibrillation ([place])   . COPD (chronic obstructive pulmonary disease) ([place])   . Diverticulosis    Pancolonic  . Essential hypertension   . Hemorrhoids   . Hiatal hernia   . Hypercholesteremia   . Liver cyst    Benign by liver biopsy September 2013  . Nephrolithiasis   . Pre-diabetes   . Reflux esophagitis   . Sleep apnea    Uses CPAP  . Tubular adenoma     Patient Active Problem List   Diagnosis Date Noted  . COPD (chronic obstructive pulmonary disease) ([place]) 04/21/2019  . Hx of adenomatous colonic polyps 01/17/2017  . Hoarseness 01/17/2017  . Hyperlipidemia 11/19/2013  . AAA (abdominal aortic aneurysm) without rupture ([place]) 02/17/2013  . Encounter for therapeutic drug monitoring 02/12/2013  . Essential hypertension 05/12/2012  . Long term (current) use of anticoagulants 03/27/2012  . Chronic atrial fibrillation ([place]) 03/13/2012  . GERD (gastroesophageal reflux disease) 10/08/2011  . Constipation 10/08/2011  . High risk medication use 10/08/2011    Past  Surgical History:  Procedure Laterality Date  . COLONOSCOPY     2003, no polyps per patient. Dr. West Carbo  . COLONOSCOPY  11/01/11   Dr. Gala Romney- tubular adenoma,suboptimal preparation, internal hemorrhoids, o/w normal rectum. pancolonic diverticulosis  . ESOPHAGOGASTRODUODENOSCOPY  11/01/11   Dr. Gala Romney- erosive reflux esophagitis along with a patulous EG junction, hialtal hernia, antral erosions with possible area of healing ulceration- s/p bx= chronic erosive gastritis, no malignancy  . HERNIA REPAIR     x2, bilateral inguinal   . KIDNEY STONE SURGERY    . KNEE ARTHROSCOPY     X2  . NASAL ENDOSCOPY         Family History  Problem Relation Age of Onset  . Breast cancer Mother   . Other Mother        Essential tremor  . Stroke Mother   . Heart disease Father        Pacemaker  . Heart attack Maternal Grandfather   . Colon cancer Neg Hx   . Liver disease Neg Hx     Social History   Tobacco Use  . Smoking status: Former Smoker    Packs/day: 0.50    Types: Cigarettes    Quit date: 1992    Years since quitting: 30.3  . Smokeless tobacco: Never Used  Vaping Use  . Vaping Use: Never used  Substance Use Topics  . Alcohol use: No  .  Drug use: No    Home Medications Prior to Admission medications   Medication Sig Start Date End Date Taking? Authorizing Provider  amLODipine (NORVASC) 10 MG tablet TAKE ONE TABLET BY MOUTH EVERY DAY Patient taking differently: Take 10 mg by mouth daily. 11/23/19 12/23/19 Yes Ailene Ards, NP  Ascorbic Acid (VITAMIN C) 1000 MG tablet Take 1,000 mg by mouth daily.   Yes [provider]  benazepril (LOTENSIN) 40 MG tablet TAKE 1 TABLET BY MOUTH ONCE DAILY Patient taking differently: Take 40 mg by mouth daily. 02/11/20 03/12/20 Yes Gosrani, Nimish C, MD  Cholecalciferol (VITAMIN D-3) 1000 units CAPS Take 2,000 Units by mouth.   Yes [provider]  furosemide (LASIX) 20 MG tablet Take 20 mg daily as needed for leg  swelling Patient taking differently: Take 20 mg by mouth as needed for edema. 03/10/19  Yes Satira Sark, MD  Garlic [id number] MG CAPS Take 1,000 mg by mouth daily.   Yes [provider]  oxybutynin (DITROPAN-XL) 10 MG 24 hr tablet TAKE 1 TABLET BY MOUTH ONCE DAILY Patient taking differently: Take 10 mg by mouth daily at 6 (six) AM. 03/03/20  Yes Ailene Ards, NP  oxyCODONE-acetaminophen (PERCOCET/ROXICET) 5-325 MG tablet Take 1 tablet by mouth every 6 (six) hours as needed for severe pain. 06/06/20  Yes Luna Fuse, MD  pantoprazole (PROTONIX) 40 MG tablet TAKE 1 TABLET BY MOUTH TWICE DAILY Patient taking differently: No sig reported 04/05/20 05/05/20 Yes Gosrani, Nimish C, MD  potassium chloride (KLOR-CON) 10 MEQ tablet TAKE 1 TABLET BY MOUTH DAILY WHEN YOU TAKE FUROSEMIDE Patient taking differently: Take 10 mEq by mouth daily. 04/11/20  Yes Gosrani, Nimish C, MD  Probiotic Product (DIGESTIVE ADVANTAGE PO) Take by mouth 2 (two) times daily.    Yes [provider]  propranolol (INDERAL) 20 MG tablet TAKE ONE TABLET BY MOUTH DAILY Patient taking differently: Take 20 mg by mouth daily at 6 (six) AM. 04/05/20  Yes Gosrani, Nimish C, MD  rosuvastatin (CRESTOR) 10 MG tablet TAKE 1 TABLET BY MOUTH ONCE DAILY Patient taking differently: Take 10 mg by mouth daily. 03/03/20 04/02/20 Yes Ailene Ards, NP  tadalafil (CIALIS) 5 MG tablet TAKE 1 TABLET (5 MG TOTAL) BY MOUTH DAILY. 01/14/20 02/13/20 Yes Ailene Ards, NP  tiZANidine (ZANAFLEX) 4 MG tablet Take 0.5 tablets (2 mg total) by mouth every 6 (six) hours as needed for muscle spasms. 06/06/20  Yes Luna Fuse, MD  warfarin (COUMADIN) 5 MG tablet TAKE ONE TABLET BY MOUTH EVERY DAY Patient taking differently: Take 5 mg by mouth daily at 6 (six) AM. 03/03/20  Yes Ailene Ards, NP    Allergies    Latex  Review of Systems   Review of Systems  Constitutional: Negative for fever.  HENT: Negative for ear pain and sore throat.   Eyes:  Negative for pain.  Respiratory: Negative for cough.   Cardiovascular: Negative for chest pain.  Gastrointestinal: Negative for abdominal pain.  Genitourinary: Negative for flank pain.  Musculoskeletal: Positive for back pain.  Skin: Negative for color change and rash.  Neurological: Negative for syncope.  All other systems reviewed and are negative.   Physical Exam Updated Vital Signs BP (!) 146/61   Pulse (!) 50   Temp 98.7 F (37.1 C) (Oral)   Resp 16   Ht 5\' 9"  (1.753 m)   Wt 99.8 kg   SpO2 98%   BMI 32.49 kg/m   Physical Exam Constitutional:  General: He is not in acute distress.    Appearance: He is well-developed.  HENT:     Head: Normocephalic.     Nose: Nose normal.  Eyes:     Extraocular Movements: Extraocular movements intact.  Cardiovascular:     Rate and Rhythm: Normal rate.  Pulmonary:     Effort: Pulmonary effort is normal.  Musculoskeletal:     Comments: No C or T-spine midline tenderness noted.  L-spine L4-5 left paraspinal tenderness noted on exam, no midline tenderness noted.  Skin:    Coloration: Skin is not jaundiced.  Neurological:     Mental Status: He is alert. Mental status is at baseline.     Comments: 5/5 strength all extremities.  Cranial nerves II to XII intact.     ED Results / Procedures / Treatments   Labs (all labs ordered are listed, but only abnormal results are displayed) Labs Reviewed  CBC WITH DIFFERENTIAL/PLATELET - Abnormal; Notable for the following components:      Result Value   RBC 4.09 (*)    Hemoglobin 12.3 (*)    All other components within normal limits  BASIC METABOLIC PANEL - Abnormal; Notable for the following components:   Glucose, Bld 105 (*)    All other components within normal limits  PROTIME-INR - Abnormal; Notable for the following components:   Prothrombin Time 26.1 (*)    INR 2.4 (*)    All other components within normal limits    EKG None  Radiology CT Angio Abd/Pel W and/or Wo  Contrast  Result Date: 06/06/2020 CLINICAL DATA:  87 year old male with right sided flank pain for 1 month. History of abdominal aortic aneurysm. EXAM: CT ANGIOGRAPHY ABDOMEN AND PELVIS WITH CONTRAST AND WITHOUT CONTRAST TECHNIQUE: Multidetector CT imaging of the abdomen and pelvis was performed using the standard protocol during bolus administration of intravenous contrast. Multiplanar reconstructed images and MIPs were obtained and reviewed to evaluate the vascular anatomy. CONTRAST:  148mL OMNIPAQUE IOHEXOL 350 MG/ML SOLN COMPARISON:  02/23/2013 FINDINGS: VASCULAR Aorta: Fusiform infrarenal abdominal aortic aneurysm measuring 3.8 cm in maximum axial dimension, increased from 3.5 cm in 2015. Near circumferential atherosclerotic calcification throughout the abdominal aorta. No evidence of dissection. Patent throughout. Celiac: Patent without evidence of aneurysm, dissection, vasculitis or significant stenosis. SMA: Mild ostial stenosis secondary to atherosclerotic plaque. Patent distally. Renals: Dual right and single left renal arteries are patent with mild ostial stenosis of the left renal artery secondary to atherosclerotic plaque. IMA: Patent without evidence of aneurysm, dissection, vasculitis or significant stenosis. Inflow: Diffuse ectasia of the right common iliac artery measuring up to 1.9 cm at the bifurcation. Scattered atherosclerotic calcifications throughout the bilateral iliac systems without evidence of significant stenosis. Proximal Outflow: Bilateral common femoral and visualized portions of the superficial and profunda femoral arteries are patent without evidence of aneurysm, dissection, vasculitis or significant stenosis. Veins: No obvious venous abnormality within the limitations of this arterial phase study. Review of the MIP images confirms the above findings. NON-VASCULAR Lower chest: Rounded pneumonia versus loculated fissural left pleural effusion. With severe biatrial cardiomegaly. No  pericardial effusions. Hepatobiliary: Liver is normal in size, contour, and attenuation. Again seen are partially exophytic cystic lesions arising from the hepatic dome and the anterior left lobe of the liver. Interval increase in peripheral calcification and heterogeneous internal attenuation, likely secondary to hemorrhagic component. No new liver mass. No intra or extrahepatic biliary ductal dilation. Pancreas: Unremarkable. No pancreatic ductal dilatation or surrounding inflammatory changes. Spleen: Normal in size  without focal abnormality. Adrenals/Urinary Tract: Adrenal glands are unremarkable. Again seen are multifocal bilateral simple renal cysts, the largest arising from the right superior pole just totally exophytic and measures up to 5.5 cm, increased from comparison. Kidneys are otherwise normal, without renal calculi, focal lesion, or hydronephrosis. Slight interval enlargement of right posterior bladder diverticulum. The bladder is otherwise unremarkable. Stomach/Bowel: Stomach is within normal limits. Appendix appears normal. Pan colonic diverticulosis without surrounding inflammatory changes. No evidence of bowel wall thickening, distention, or inflammatory changes. Lymphatic: No abdominopelvic lymphadenopathy. Reproductive: Similar appearing prostatomegaly. Other: No abdominal wall hernia or abnormality. No abdominopelvic ascites. Left inguinal surgical staples in place. Musculoskeletal: Multilevel degenerative changes of the visualized thoracolumbar spine. Diffuse osteopenia. No acute osseous abnormality. IMPRESSION: VASCULAR 1. Fusiform infrarenal abdominal aortic aneurysm measuring up to 3.8 cm, increased from 3.5 cm in 2015. Aortic aneurysm NOS (ICD10-I71.9). Recommend follow-up ultrasound every 2 years. This recommendation follows ACR consensus guidelines: White Paper of the ACR Incidental Findings Committee II on Vascular Findings. J Am Coll Radiol 2013; 10:789-794. NON-VASCULAR 1. Similar  size of previously visualized partially exophytic bilateral hepatic cystic masses, now with heterogeneous internal attenuation and increased peripheral calcifications, likely secondary to interval hemorrhagic changes. 2. Similar appearing multifocal simple renal cysts, the largest arising from the right superior pole without evidence of hemorrhagic component. 3. Pan-colonic diverticulosis. Ruthann Cancer, MD [facility] Radiology Electronically Signed   By: Ruthann Cancer MD   On: 06/06/2020 11:10    Procedures Procedures   Medications Ordered in ED Medications  oxyCODONE-acetaminophen (PERCOCET/ROXICET) 5-325 MG per tablet 1 tablet (has no administration in time range)  morphine 2 MG/ML injection 2 mg (2 mg Intravenous Given 06/06/20 0920)  ondansetron (ZOFRAN) injection 4 mg (4 mg Intravenous Given 06/06/20 0918)  iohexol (OMNIPAQUE) 350 MG/ML injection 100 mL (100 mLs Intravenous Contrast Given 06/06/20 0954)    ED Course  I have reviewed the triage vital signs and the nursing notes.  Pertinent labs & imaging results that were available during my care of the patient were reviewed by me and considered in my medical decision making (see chart for details).    MDM Rules/Calculators/A&P                          Patient has a significantly antalgic gait.  However no neurodeficit is noted 5/5 strength all extremities.  Patient denies any new numbness or weakness no saddle anesthesia noted.  Given his history of aortic aneurysm, CT imaging pursued, aneurysm appears slightly more enlarged but no rupture or other pathology noted.  Advising follow-up with his vascular surgeon within the week, advised follow-up with his primary care doctor for his back pain.  Given morphine and Percocet.  Advised immediate return for new numbness weakness worsening symptoms or any additional concerns.  Final Clinical Impression(s) / ED Diagnoses Final diagnoses:   None    Rx / DC Orders ED Discharge Orders         Ordered    oxyCODONE-acetaminophen (PERCOCET/ROXICET) 5-325 MG tablet  Every 6 hours PRN        06/06/20 1211    tiZANidine (ZANAFLEX) 4 MG tablet  Every 6 hours PRN        06/06/20 1211           Luna Fuse, MD 06/06/20 1211

## 2020-06-06 NOTE — ED Notes (Signed)
Patient transported to CT 

## 2020-06-06 NOTE — Discharge Instructions (Addendum)
Call your primary care doctor or specialist as discussed in the next 2-3 days.   Return immediately back to the ER if:  Your symptoms worsen within the next 12-24 hours. You develop new symptoms such as new fevers, persistent vomiting, new pain, shortness of breath, or new weakness or numbness, or if you have any other concerns.  

## 2020-06-07 ENCOUNTER — Telehealth (INDEPENDENT_AMBULATORY_CARE_PROVIDER_SITE_OTHER): Payer: Self-pay

## 2020-06-07 NOTE — Telephone Encounter (Signed)
Transition Care Management Follow-up Telephone Call  Date of discharge and from where: [facility] on 06/06/2020  How have you been since you were released from the hospital? Back to [facility]   Any questions or concerns? Yes Son in [place] Wanted to know if we would f/u and see him in office if need ? Items Reviewed:  Did the pt receive and understand the discharge instructions provided? Yes   Medications obtained and verified? Yes   Other? Yes   Any new allergies since your discharge? No   Dietary orders reviewed? No  Do you have support at home? Yes   Home Care and Equipment/Supplies: Were home health services ordered? no If so, what is the name of the agency? [facility] has all rehab staff on site to [place] for any needs.  Has the agency set up a time to come to the patient's home? not applicable Were any new equipment or medical supplies ordered?  No What is the name of the medical supply agency? no Were you able to get the supplies/equipment? not applicable Do you have any questions related to the use of the equipment or supplies? No  Functional Questionnaire: (I = Independent and D = Dependent) ADLs: I   Bathing/Dressing- I  Meal Prep- D  Eating- I  Maintaining continence- I  Transferring/Ambulation- I W/1 ASSIT  Managing Meds- D , FAMILY AND NURSING ON CAMPUS HELP SETUP PILL BOXES.  Follow up appointments reviewed:   PCP Hospital f/u appt confirmed? Yes  Scheduled to see Dr. Anastasio Champion  on 06/14/20 @ 11:45am..  [facility] f/u appt confirmed? No  Scheduled to see-n/a.  Are transportation arrangements needed? No   If their condition worsens, is the pt aware to call PCP or go to the Emergency Dept.? Yes  Was the patient provided with contact information for the PCP's office or ED? Yes  Was to pt encouraged to call back with questions or concerns? No

## 2020-06-09 ENCOUNTER — Emergency Department (HOSPITAL_COMMUNITY): Payer: Medicare PPO

## 2020-06-09 ENCOUNTER — Encounter (HOSPITAL_COMMUNITY): Payer: Self-pay | Admitting: Emergency Medicine

## 2020-06-09 ENCOUNTER — Other Ambulatory Visit: Payer: Self-pay

## 2020-06-09 ENCOUNTER — Emergency Department (HOSPITAL_COMMUNITY)
Admission: EM | Admit: 2020-06-09 | Discharge: 2020-06-09 | Disposition: A | Discharge status: Home / Self Care | Payer: Medicare PPO | Attending: Emergency Medicine | Admitting: Emergency Medicine

## 2020-06-09 DIAGNOSIS — R109 Unspecified abdominal pain: Secondary | ICD-10-CM | POA: Diagnosis not present

## 2020-06-09 DIAGNOSIS — J449 Chronic obstructive pulmonary disease, unspecified: Secondary | ICD-10-CM | POA: Diagnosis not present

## 2020-06-09 DIAGNOSIS — I482 Chronic atrial fibrillation, unspecified: Secondary | ICD-10-CM | POA: Insufficient documentation

## 2020-06-09 DIAGNOSIS — Z79899 Other long term (current) drug therapy: Secondary | ICD-10-CM | POA: Insufficient documentation

## 2020-06-09 DIAGNOSIS — I10 Essential (primary) hypertension: Secondary | ICD-10-CM | POA: Diagnosis not present

## 2020-06-09 DIAGNOSIS — M5459 Other low back pain: Secondary | ICD-10-CM | POA: Diagnosis not present

## 2020-06-09 DIAGNOSIS — Z87891 Personal history of nicotine dependence: Secondary | ICD-10-CM | POA: Diagnosis not present

## 2020-06-09 DIAGNOSIS — M545 Low back pain, unspecified: Secondary | ICD-10-CM | POA: Diagnosis not present

## 2020-06-09 DIAGNOSIS — D1809 Hemangioma of other sites: Secondary | ICD-10-CM | POA: Diagnosis not present

## 2020-06-09 DIAGNOSIS — Z8679 Personal history of other diseases of the circulatory system: Secondary | ICD-10-CM | POA: Diagnosis not present

## 2020-06-09 DIAGNOSIS — Z91040 Latex allergy status: Secondary | ICD-10-CM | POA: Diagnosis not present

## 2020-06-09 DIAGNOSIS — Z7901 Long term (current) use of anticoagulants: Secondary | ICD-10-CM | POA: Insufficient documentation

## 2020-06-09 DIAGNOSIS — M4804 Spinal stenosis, thoracic region: Secondary | ICD-10-CM | POA: Diagnosis not present

## 2020-06-09 LAB — URINALYSIS, ROUTINE W REFLEX MICROSCOPIC
Bilirubin Urine: NEGATIVE
Glucose, UA: NEGATIVE mg/dL
Hgb urine dipstick: NEGATIVE
Ketones, ur: NEGATIVE mg/dL
Leukocytes,Ua: NEGATIVE
Nitrite: NEGATIVE
Protein, ur: NEGATIVE mg/dL
Specific Gravity, Urine: 1.009 (ref 1.005–1.030)
pH: 6 (ref 5.0–8.0)

## 2020-06-09 LAB — CBC WITH DIFFERENTIAL/PLATELET
Abs Immature Granulocytes: 0.01 10*3/uL (ref 0.00–0.07)
Basophils Absolute: 0 10*3/uL (ref 0.0–0.1)
Basophils Relative: 0 %
Eosinophils Absolute: 0.1 10*3/uL (ref 0.0–0.5)
Eosinophils Relative: 1 %
HCT: 37.3 % — ABNORMAL LOW (ref 39.0–52.0)
Hemoglobin: 11.7 g/dL — ABNORMAL LOW (ref 13.0–17.0)
Immature Granulocytes: 0 %
Lymphocytes Relative: 27 %
Lymphs Abs: 1.2 10*3/uL (ref 0.7–4.0)
MCH: 30.1 pg (ref 26.0–34.0)
MCHC: 31.4 g/dL (ref 30.0–36.0)
MCV: 95.9 fL (ref 80.0–100.0)
Monocytes Absolute: 0.5 10*3/uL (ref 0.1–1.0)
Monocytes Relative: 11 %
Neutro Abs: 2.8 10*3/uL (ref 1.7–7.7)
Neutrophils Relative %: 61 %
Platelets: 158 10*3/uL (ref 150–400)
RBC: 3.89 MIL/uL — ABNORMAL LOW (ref 4.22–5.81)
RDW: 14.1 % (ref 11.5–15.5)
WBC: 4.6 10*3/uL (ref 4.0–10.5)
nRBC: 0 % (ref 0.0–0.2)

## 2020-06-09 LAB — PROTIME-INR
INR: 3 — ABNORMAL HIGH (ref 0.8–1.2)
Prothrombin Time: 31.4 seconds — ABNORMAL HIGH (ref 11.4–15.2)

## 2020-06-09 LAB — BASIC METABOLIC PANEL
Anion gap: 6 (ref 5–15)
BUN: 23 mg/dL (ref 8–23)
CO2: 28 mmol/L (ref 22–32)
Calcium: 8.9 mg/dL (ref 8.9–10.3)
Chloride: 101 mmol/L (ref 98–111)
Creatinine, Ser: 0.99 mg/dL (ref 0.61–1.24)
GFR, Estimated: >60 mL/min (ref >60)
Glucose, Bld: 98 mg/dL (ref 70–99)
Potassium: 4 mmol/L (ref 3.5–5.1)
Sodium: 135 mmol/L (ref 135–145)

## 2020-06-09 LAB — C-REACTIVE PROTEIN: CRP: 0.6 mg/dL (ref <1.0)

## 2020-06-09 LAB — SEDIMENTATION RATE: Sed Rate: 43 mm/hr — ABNORMAL HIGH (ref 0–16)

## 2020-06-09 MED ORDER — GADOBUTROL 1 MMOL/ML IV SOLN
10.0000 mL | Freq: Once | INTRAVENOUS | Status: AC | PRN
  Administered 2020-06-09: 10 mL via INTRAVENOUS

## 2020-06-09 MED ORDER — DEXAMETHASONE SODIUM PHOSPHATE 10 MG/ML IJ SOLN
10.0000 mg | Freq: Once | INTRAMUSCULAR | Status: AC
  Administered 2020-06-09: 10 mg via INTRAVENOUS
  Filled 2020-06-09: qty 1

## 2020-06-09 MED ORDER — OXYCODONE-ACETAMINOPHEN 5-325 MG PO TABS: 1.0000 | ORAL_TABLET | Freq: Four times a day (QID) | ORAL | 0 refills | Status: DC | PRN

## 2020-06-09 MED ORDER — MORPHINE SULFATE (PF) 4 MG/ML IV SOLN
4.0000 mg | Freq: Once | INTRAVENOUS | Status: AC
Start: 2020-06-09 — End: 2020-06-09
  Administered 2020-06-09: 4 mg via INTRAVENOUS
  Filled 2020-06-09: qty 1

## 2020-06-09 MED ORDER — DIAZEPAM 5 MG PO TABS
5.0000 mg | ORAL_TABLET | Freq: Once | ORAL | Status: AC
  Administered 2020-06-09: 5 mg via ORAL
  Filled 2020-06-09: qty 1

## 2020-06-09 MED ORDER — DIAZEPAM 5 MG PO TABS: 5.0000 mg | ORAL_TABLET | Freq: Two times a day (BID) | ORAL | 0 refills | Status: DC

## 2020-06-09 NOTE — Discharge Instructions (Signed)
Follow-up with primary care doctor as scheduled for Tuesday.  Renewed prescription for Percocet.  And for Valium.  MRI thoracic and lumbar back without any acute findings.  Lots of degenerative changes.

## 2020-06-09 NOTE — ED Triage Notes (Signed)
Pt c/o left lower back pain. Pt was seen for the same here Monday. Pt states pain meds and muscle relaxers are not working.

## 2020-06-09 NOTE — ED Provider Notes (Signed)
MRI without any acute findings.  But lots of degenerative type changes.  No evidence of infection or abscess.  No distinct herniated disc.  Patient received Decadron here.  Will discharge home with additional prescription for Percocet and Valium he has follow-up with primary care doctor on Tuesday.   Fredia Sorrow, MD 06/09/20 [phone]

## 2020-06-09 NOTE — ED Provider Notes (Signed)
[facility] EMERGENCY DEPARTMENT Provider Note   CSN: [id number] Arrival date & time: 06/09/20  0309     History Chief Complaint  Patient presents with  . Back Pain    Tristan Lopez is a 87 y.o. male.  HPI     This is an 87 year old male with a history of AAA, chronic A. fib on Coumadin, hypertension who presents with ongoing and worsening back pain.  Patient was seen and evaluated on Monday for the same.  At that time he had several week history of intermittent left-sided pain.  However, it became more persistent.  It is worse with certain movements.  Patient states that he was discharged with muscle relaxers and pain medication but "nothing helps."  He rates his pain at 10 out of 10.  His son is at the bedside and states he has not been able to sleep.  He denies any trauma.  He denies any fevers.  He denies any radiation of the pain down his legs.  He denies any bowel or bladder incontinence or lower extremity weakness.  He is using his walker more.  He has not noted any rash on his back or flank.  Work-up on Monday reviewed.  Labs fairly benign.  CT scan was obtained to evaluate his AAA given history.  There was slight interval increase but no rupture.  Past Medical History:  Diagnosis Date  . Abdominal aortic aneurysm without rupture ([place])   . Bilateral renal cysts   . BPH (benign prostatic hyperplasia)   . Carotid artery occlusion   . Chronic atrial fibrillation ([place])   . COPD (chronic obstructive pulmonary disease) ([place])   . Diverticulosis    Pancolonic  . Essential hypertension   . Hemorrhoids   . Hiatal hernia   . Hypercholesteremia   . Liver cyst    Benign by liver biopsy September 2013  . Nephrolithiasis   . Pre-diabetes   . Reflux esophagitis   . Sleep apnea    Uses CPAP  . Tubular adenoma     Patient Active Problem List   Diagnosis Date Noted  . COPD (chronic obstructive pulmonary disease) ([place]) 04/21/2019  . Hx of adenomatous colonic polyps  01/17/2017  . Hoarseness 01/17/2017  . Hyperlipidemia 11/19/2013  . AAA (abdominal aortic aneurysm) without rupture ([place]) 02/17/2013  . Encounter for therapeutic drug monitoring 02/12/2013  . Essential hypertension 05/12/2012  . Long term (current) use of anticoagulants 03/27/2012  . Chronic atrial fibrillation ([place]) 03/13/2012  . GERD (gastroesophageal reflux disease) 10/08/2011  . Constipation 10/08/2011  . High risk medication use 10/08/2011    Past Surgical History:  Procedure Laterality Date  . COLONOSCOPY     2003, no polyps per patient. Dr. West Carbo  . COLONOSCOPY  11/01/11   Dr. Gala Romney- tubular adenoma,suboptimal preparation, internal hemorrhoids, o/w normal rectum. pancolonic diverticulosis  . ESOPHAGOGASTRODUODENOSCOPY  11/01/11   Dr. Gala Romney- erosive reflux esophagitis along with a patulous EG junction, hialtal hernia, antral erosions with possible area of healing ulceration- s/p bx= chronic erosive gastritis, no malignancy  . HERNIA REPAIR     x2, bilateral inguinal   . KIDNEY STONE SURGERY    . KNEE ARTHROSCOPY     X2  . NASAL ENDOSCOPY         Family History  Problem Relation Age of Onset  . Breast cancer Mother   . Other Mother        Essential tremor  . Stroke Mother   . Heart disease Father  Pacemaker  . Heart attack Maternal Grandfather   . Colon cancer Neg Hx   . Liver disease Neg Hx     Social History   Tobacco Use  . Smoking status: Former Smoker    Packs/day: 0.50    Types: Cigarettes    Quit date: 1992    Years since quitting: 30.4  . Smokeless tobacco: Never Used  Vaping Use  . Vaping Use: Never used  Substance Use Topics  . Alcohol use: No  . Drug use: No    Home Medications Prior to Admission medications   Medication Sig Start Date End Date Taking? Authorizing Provider  amLODipine (NORVASC) 10 MG tablet TAKE ONE TABLET BY MOUTH EVERY DAY Patient taking differently: Take 10 mg by mouth daily. 11/23/19 12/23/19  Ailene Ards,  NP  Ascorbic Acid (VITAMIN C) 1000 MG tablet Take 1,000 mg by mouth daily.    [provider]  benazepril (LOTENSIN) 40 MG tablet TAKE 1 TABLET BY MOUTH ONCE DAILY Patient taking differently: Take 40 mg by mouth daily. 02/11/20 03/12/20  [name], MD  Cholecalciferol (VITAMIN D-3) 1000 units CAPS Take 2,000 Units by mouth.    [provider]  furosemide (LASIX) 20 MG tablet Take 20 mg daily as needed for leg swelling Patient taking differently: Take 20 mg by mouth as needed for edema. 03/10/19   Satira Sark, MD  Garlic 0630 MG CAPS Take 1,000 mg by mouth daily.    [provider]  oxybutynin (DITROPAN-XL) 10 MG 24 hr tablet TAKE 1 TABLET BY MOUTH ONCE DAILY Patient taking differently: Take 10 mg by mouth daily at 6 (six) AM. 03/03/20   Ailene Ards, NP  oxyCODONE-acetaminophen (PERCOCET/ROXICET) 5-325 MG tablet Take 1 tablet by mouth every 6 (six) hours as needed for severe pain. 06/06/20   Luna Fuse, MD  pantoprazole (PROTONIX) 40 MG tablet TAKE 1 TABLET BY MOUTH TWICE DAILY Patient taking differently: No sig reported 04/05/20 05/05/20  Hurshel Party C, MD  potassium chloride (KLOR-CON) 10 MEQ tablet TAKE 1 TABLET BY MOUTH DAILY WHEN YOU TAKE FUROSEMIDE Patient taking differently: Take 10 mEq by mouth daily. 04/11/20   [name], MD  Probiotic Product (DIGESTIVE ADVANTAGE PO) Take by mouth 2 (two) times daily.     [provider]  propranolol (INDERAL) 20 MG tablet TAKE ONE TABLET BY MOUTH DAILY Patient taking differently: Take 20 mg by mouth daily at 6 (six) AM. 04/05/20   Gosrani, Nimish C, MD  rosuvastatin (CRESTOR) 10 MG tablet TAKE 1 TABLET BY MOUTH ONCE DAILY Patient taking differently: Take 10 mg by mouth daily. 03/03/20 04/02/20  Ailene Ards, NP  tadalafil (CIALIS) 5 MG tablet TAKE 1 TABLET (5 MG TOTAL) BY MOUTH DAILY. 06/06/20 07/06/20  Noreene Larsson, NP  tiZANidine (ZANAFLEX) 4 MG tablet Take 0.5 tablets (2 mg total) by mouth  every 6 (six) hours as needed for muscle spasms. 06/06/20   Luna Fuse, MD  warfarin (COUMADIN) 5 MG tablet TAKE ONE TABLET BY MOUTH EVERY DAY Patient taking differently: Take 5 mg by mouth daily at 6 (six) AM. 03/03/20   Ailene Ards, NP    Allergies    Latex  Review of Systems   Review of Systems  Constitutional: Negative for fever.  Respiratory: Negative for shortness of breath.   Cardiovascular: Negative for chest pain.  Gastrointestinal: Negative for abdominal pain, nausea and vomiting.  Genitourinary: Negative for difficulty urinating and dysuria.  Musculoskeletal:  Positive for back pain.  Neurological: Negative for weakness and numbness.  All other systems reviewed and are negative.   Physical Exam Updated Vital Signs BP 139/72   Pulse 63   Temp 97.6 F (36.4 C) (Oral)   Resp 18   Ht 1.753 m (5\' 9" )   Wt 100 kg   SpO2 100%   BMI 32.56 kg/m   Physical Exam Vitals and nursing note reviewed.  Constitutional:      Appearance: He is well-developed.     Comments: Uncomfortable appearing but nontoxic  HENT:     Head: Normocephalic and atraumatic.     Mouth/Throat:     Mouth: Mucous membranes are moist.  Eyes:     Pupils: Pupils are equal, round, and reactive to light.  Cardiovascular:     Rate and Rhythm: Normal rate and regular rhythm.     Heart sounds: Normal heart sounds. No murmur heard.   Pulmonary:     Effort: Pulmonary effort is normal. No respiratory distress.     Breath sounds: Normal breath sounds. No wheezing.  Abdominal:     General: Bowel sounds are normal.     Palpations: Abdomen is soft.     Tenderness: There is no abdominal tenderness. There is no rebound.  Musculoskeletal:     Cervical back: Neck supple.     Comments: Tenderness palpation of the paraspinous musculature of the thoracic and lumbar spine just left of midline, no overlying skin changes or rash, negative straight leg raise  Lymphadenopathy:     Cervical: No cervical  adenopathy.  Skin:    General: Skin is warm and dry.  Neurological:     Mental Status: He is alert and oriented to person, place, and time.     Comments: Slightly decreased strength with plantar and dorsiflexion on the left as well as hip flexion, unclear if this is secondary to effort and pain.  Normal and equal patellar reflexes bilaterally, no clonus  Psychiatric:        Mood and Affect: Mood normal.     ED Results / Procedures / Treatments   Labs (all labs ordered are listed, but only abnormal results are displayed) Labs Reviewed  CBC WITH DIFFERENTIAL/PLATELET - Abnormal; Notable for the following components:      Result Value   RBC 3.89 (*)    Hemoglobin 11.7 (*)    HCT 37.3 (*)    All other components within normal limits  SEDIMENTATION RATE - Abnormal; Notable for the following components:   Sed Rate 43 (*)    All other components within normal limits  PROTIME-INR - Abnormal; Notable for the following components:   Prothrombin Time 31.4 (*)    INR 3.0 (*)    All other components within normal limits  BASIC METABOLIC PANEL  C-REACTIVE PROTEIN  URINALYSIS, ROUTINE W REFLEX MICROSCOPIC    EKG None  Radiology No results found.  Procedures Procedures   Medications Ordered in ED Medications  dexamethasone (DECADRON) injection 10 mg (10 mg Intravenous Given 06/09/20 0452)  diazepam (VALIUM) tablet 5 mg (5 mg Oral Given 06/09/20 0451)    ED Course  I have reviewed the triage vital signs and the nursing notes.  Pertinent labs & imaging results that were available during my care of the patient were reviewed by me and considered in my medical decision making (see chart for details).    MDM Rules/Calculators/A&P  Patient presents with ongoing back pain.  Recent evaluation for the same.  Had CT imaging that revealed slightly enlarged aortic aneurysm but no rupture.  Pain has been refractory to medications at home.  His description of the pain  is most consistent with musculoskeletal etiology.  He has no hard signs of cauda equina.  He may be has some mild left lower extremity weakness although this is unclear whether it is related to pain.  Patient was given Valium and a dose of Decadron.  Every repeated labs.  Red flags for back pain for him include anticoagulant use and age.  He has no history of cancer or fever.  Sed rate and CRP sent to her stratify for epidural abscess.  Sed rate is 43, CRP is negative.  No leukocytosis.  Would doubt acute infection given this information.  Hemoglobin slightly down trended to 11.7.  Given refractory pain with appropriate pain management at home, will obtain MRI to rule out spinal lesion or bleed. Final Clinical Impression(s) / ED Diagnoses Final diagnoses:  None    Rx / DC Orders ED Discharge Orders    None       Ahana Najera, Barbette Hair, MD 06/09/20 [phone]

## 2020-06-14 ENCOUNTER — Ambulatory Visit (INDEPENDENT_AMBULATORY_CARE_PROVIDER_SITE_OTHER): Payer: Medicare PPO | Admitting: Internal Medicine

## 2020-06-14 ENCOUNTER — Encounter (INDEPENDENT_AMBULATORY_CARE_PROVIDER_SITE_OTHER): Payer: Self-pay | Admitting: Internal Medicine

## 2020-06-14 ENCOUNTER — Other Ambulatory Visit: Payer: Self-pay

## 2020-06-14 VITALS — BP 121/68 | HR 70 | Temp 97.3°F | Resp 18 | Ht 69.0 in | Wt 231.0 lb

## 2020-06-14 DIAGNOSIS — G8929 Other chronic pain: Secondary | ICD-10-CM | POA: Diagnosis not present

## 2020-06-14 DIAGNOSIS — M545 Low back pain, unspecified: Secondary | ICD-10-CM

## 2020-06-14 DIAGNOSIS — M353 Polymyalgia rheumatica: Secondary | ICD-10-CM

## 2020-06-14 DIAGNOSIS — N39490 Overflow incontinence: Secondary | ICD-10-CM | POA: Diagnosis not present

## 2020-06-14 DIAGNOSIS — R600 Localized edema: Secondary | ICD-10-CM | POA: Diagnosis not present

## 2020-06-14 MED ORDER — FUROSEMIDE 20 MG PO TABS: 20.0000 mg | ORAL_TABLET | Freq: Every day | ORAL | 1 refills | Status: DC

## 2020-06-14 MED ORDER — PREDNISONE 5 MG PO TABS: 5.0000 mg | ORAL_TABLET | Freq: Every day | ORAL | 3 refills | Status: DC

## 2020-06-14 NOTE — Progress Notes (Signed)
Metrics: Intervention Frequency ACO  Documented Smoking Status Yearly  Screened one or more times in 24 months  Cessation Counseling or  Active cessation medication Past 24 months  Past 24 months   Guideline developer: UpToDate (See UpToDate for funding source) Date Released: 2014       Wellness Office Visit  Subjective:  Patient ID: Tristan Lopez, male    DOB: Nov 22, 1930  Age: 87 y.o. MRN: [id number]  CC: Low back pain. HPI  This man was seen in the emergency room about a week ago with low back pain.  He underwent MRI thoracic and lumbar spine which showed extensive osteoarthritic disease.  The son, who was present with him today, tells me that at the assisted living facility the patient is not very mobile. He previously had a history of polymyalgia rheumatica and several years ago, we reduced his prednisone and eventually discontinued it when his sedimentation rate was very low.  More recently, his sed rate is up now to 43. He was prescribed Percocet and Valium for his back pain.  The son is not keen that he continue this and I agree with him. The patient also complains of urinary incontinence and he is taking oxybutynin. He has not seen urologist. The patient also has developed bilateral lower leg edema.  He was previously on furosemide but has not had this for some time now. Past Medical History:  Diagnosis Date  . Abdominal aortic aneurysm without rupture ([place])   . Bilateral renal cysts   . BPH (benign prostatic hyperplasia)   . Carotid artery occlusion   . Chronic atrial fibrillation ([place])   . COPD (chronic obstructive pulmonary disease) ([place])   . Diverticulosis    Pancolonic  . Essential hypertension   . Hemorrhoids   . Hiatal hernia   . Hypercholesteremia   . Liver cyst    Benign by liver biopsy September 2013  . Nephrolithiasis   . Pre-diabetes   . Reflux esophagitis   . Sleep apnea    Uses CPAP  . Tubular adenoma    Past Surgical History:  Procedure  Laterality Date  . COLONOSCOPY     2003, no polyps per patient. Dr. West Carbo  . COLONOSCOPY  11/01/11   Dr. Gala Romney- tubular adenoma,suboptimal preparation, internal hemorrhoids, o/w normal rectum. pancolonic diverticulosis  . ESOPHAGOGASTRODUODENOSCOPY  11/01/11   Dr. Gala Romney- erosive reflux esophagitis along with a patulous EG junction, hialtal hernia, antral erosions with possible area of healing ulceration- s/p bx= chronic erosive gastritis, no malignancy  . HERNIA REPAIR     x2, bilateral inguinal   . KIDNEY STONE SURGERY    . KNEE ARTHROSCOPY     X2  . NASAL ENDOSCOPY       Family History  Problem Relation Age of Onset  . Breast cancer Mother   . Other Mother        Essential tremor  . Stroke Mother   . Heart disease Father        Pacemaker  . Heart attack Maternal Grandfather   . Colon cancer Neg Hx   . Liver disease Neg Hx     Social History   Social History Narrative   Married for 66 years.Retired.Now living in [place].   Social History   Tobacco Use  . Smoking status: Former Smoker    Packs/day: 0.50    Types: Cigarettes    Quit date: 1992    Years since quitting: 30.4  . Smokeless tobacco: Never Used  Substance Use Topics  . Alcohol use: No    Current Meds  Medication Sig  . amLODipine (NORVASC) 10 MG tablet TAKE ONE TABLET BY MOUTH EVERY DAY (Patient taking differently: Take 10 mg by mouth daily.)  . Ascorbic Acid (VITAMIN C) 1000 MG tablet Take 1,000 mg by mouth daily.  . benazepril (LOTENSIN) 40 MG tablet TAKE 1 TABLET BY MOUTH ONCE DAILY (Patient taking differently: Take 40 mg by mouth daily.)  . Cholecalciferol (VITAMIN D-3) 1000 units CAPS Take 2,000 Units by mouth.  . diazepam (VALIUM) 5 MG tablet Take 1 tablet (5 mg total) by mouth 2 (two) times daily.  . Garlic 6195 MG CAPS Take 1,000 mg by mouth daily.  Marland Kitchen oxybutynin (DITROPAN-XL) 10 MG 24 hr tablet TAKE 1 TABLET BY MOUTH ONCE DAILY (Patient taking differently: Take  10 mg by mouth daily at 6 (six) AM.)  . pantoprazole (PROTONIX) 40 MG tablet TAKE 1 TABLET BY MOUTH TWICE DAILY (Patient taking differently: Take 40 mg by mouth 2 (two) times daily.)  . potassium chloride (KLOR-CON) 10 MEQ tablet TAKE 1 TABLET BY MOUTH DAILY WHEN YOU TAKE FUROSEMIDE (Patient taking differently: Take 10 mEq by mouth daily.)  . predniSONE (DELTASONE) 5 MG tablet Take 1 tablet (5 mg total) by mouth daily with breakfast.  . Probiotic Product (DIGESTIVE ADVANTAGE PO) Take by mouth 2 (two) times daily.   . propranolol (INDERAL) 20 MG tablet TAKE ONE TABLET BY MOUTH DAILY (Patient taking differently: Take 20 mg by mouth daily at 6 (six) AM.)  . rosuvastatin (CRESTOR) 10 MG tablet TAKE 1 TABLET BY MOUTH ONCE DAILY (Patient taking differently: Take 10 mg by mouth daily.)  . tadalafil (CIALIS) 5 MG tablet TAKE 1 TABLET (5 MG TOTAL) BY MOUTH DAILY.  Marland Kitchen tiZANidine (ZANAFLEX) 4 MG tablet Take 0.5 tablets (2 mg total) by mouth every 6 (six) hours as needed for muscle spasms.  . [DISCONTINUED] furosemide (LASIX) 20 MG tablet Take 20 mg daily as needed for leg swelling (Patient taking differently: Take 20 mg by mouth as needed for edema.)       Objective:   Today's Vitals: BP 121/68 (BP Location: Left Arm, Patient Position: Sitting, Cuff Size: Normal)   Pulse 70   Temp (!) 97.3 F (36.3 C) (Temporal)   Resp 18   Ht 5\' 9"  (1.753 m)   Wt 231 lb (104.8 kg)   SpO2 98%   BMI 34.11 kg/m  Vitals with BMI 06/14/2020 06/09/2020 06/09/2020  Height 5\' 9"  - -  Weight 231 lbs - -  BMI 09.3 - -  Systolic 267 124 580  Diastolic 68 69 62  Pulse 70 82 63     Physical Exam  Physical exam shows bilateral pitting lower leg edema.  He remains obese.  Blood pressure is in a good range.     Assessment   1. Chronic bilateral low back pain without sciatica   2. Overflow incontinence of urine   3. Polymyalgia rheumatica ([place])   4. Localized edema       Tests ordered Orders Placed This  Encounter  Procedures  . Ambulatory referral to Urology     Plan: 1. I am going to start him on very low-dose prednisone 5 mg daily for his previous history of polymyalgia rheumatica and explained to the patient and son that this may actually help his low back pain.  We will watch for side effects and weight gain although I doubt that this will occur with such a  low dose. 2. I will refer him to urology regarding his urinary incontinence. 3. He also needs to restart low-dose furosemide 20 mg daily for his bilateral lower leg edema. 4. I will see him in 6 weeks and we will repeat blood work then.   Meds ordered this encounter  Medications  . furosemide (LASIX) 20 MG tablet    Sig: Take 1 tablet (20 mg total) by mouth daily. Take 20 mg daily as needed for leg swelling    Dispense:  90 tablet    Refill:  1  . predniSONE (DELTASONE) 5 MG tablet    Sig: Take 1 tablet (5 mg total) by mouth daily with breakfast.    Dispense:  30 tablet    Refill:  3    Adelaide Pfefferkorn Luther Parody, MD

## 2020-06-14 NOTE — Progress Notes (Signed)
Son wants to talk about coming off the Pain meds & a alternative.  ? About Valium if he needs to keep taking ? Now taking 1 at night.  Not currently taking lasix; check BLE.

## 2020-07-14 ENCOUNTER — Other Ambulatory Visit (INDEPENDENT_AMBULATORY_CARE_PROVIDER_SITE_OTHER): Payer: Self-pay | Admitting: Nurse Practitioner

## 2020-08-09 ENCOUNTER — Ambulatory Visit (INDEPENDENT_AMBULATORY_CARE_PROVIDER_SITE_OTHER): Payer: Medicare PPO | Admitting: Internal Medicine

## 2020-08-12 ENCOUNTER — Ambulatory Visit (INDEPENDENT_AMBULATORY_CARE_PROVIDER_SITE_OTHER): Payer: Medicare PPO | Admitting: Urology

## 2020-08-12 ENCOUNTER — Encounter: Payer: Self-pay | Admitting: Urology

## 2020-08-12 ENCOUNTER — Other Ambulatory Visit: Payer: Self-pay

## 2020-08-12 VITALS — BP 158/78 | HR 73 | Temp 98.4°F

## 2020-08-12 DIAGNOSIS — N401 Enlarged prostate with lower urinary tract symptoms: Secondary | ICD-10-CM | POA: Diagnosis not present

## 2020-08-12 DIAGNOSIS — N138 Other obstructive and reflux uropathy: Secondary | ICD-10-CM

## 2020-08-12 DIAGNOSIS — R339 Retention of urine, unspecified: Secondary | ICD-10-CM | POA: Diagnosis not present

## 2020-08-12 DIAGNOSIS — R32 Unspecified urinary incontinence: Secondary | ICD-10-CM

## 2020-08-12 LAB — URINALYSIS, ROUTINE W REFLEX MICROSCOPIC
Bilirubin, UA: NEGATIVE
Glucose, UA: NEGATIVE
Ketones, UA: NEGATIVE
Nitrite, UA: NEGATIVE
Protein,UA: NEGATIVE
RBC, UA: NEGATIVE
Specific Gravity, UA: 1.015 (ref 1.005–1.030)
Urobilinogen, Ur: 0.2 mg/dL (ref 0.2–1.0)
pH, UA: 7 (ref 5.0–7.5)

## 2020-08-12 LAB — MICROSCOPIC EXAMINATION
Bacteria, UA: NONE SEEN
Epithelial Cells (non renal): NONE SEEN /hpf (ref 0–10)
RBC, Urine: NONE SEEN /hpf (ref 0–2)
Renal Epithel, UA: NONE SEEN /hpf

## 2020-08-12 LAB — BLADDER SCAN AMB NON-IMAGING

## 2020-08-12 MED ORDER — SILODOSIN 8 MG PO CAPS: 8.0000 mg | ORAL_CAPSULE | Freq: Every day | ORAL | 11 refills | Status: DC

## 2020-08-12 NOTE — Progress Notes (Signed)
Urological Symptom Review  Patient is experiencing the following symptoms: Frequent urination Hard to postpone urination Get up at night to urinate Leakage of urine   Review of Systems  Gastrointestinal (upper)  : Negative for upper GI symptoms  Gastrointestinal (lower) : Negative for lower GI symptoms  Constitutional : Negative for symptoms  Skin: Negative for skin symptoms  Eyes: Negative for eye symptoms  Ear/Nose/Throat : Negative for Ear/Nose/Throat symptoms  Hematologic/Lymphatic: Negative for Hematologic/Lymphatic symptoms  Cardiovascular : Negative for cardiovascular symptoms  Respiratory : Negative for respiratory symptoms  Endocrine: Negative for endocrine symptoms  Musculoskeletal: Back pain  Neurological: Negative for neurological symptoms  Psychologic: Negative for psychiatric symptoms

## 2020-08-12 NOTE — Patient Instructions (Signed)
Benign Prostatic Hyperplasia  Benign prostatic hyperplasia (BPH) is an enlarged prostate gland that is caused by the normal aging process and not by cancer. The prostate is a walnut-sized gland that is involved in the production of semen. It is located in front of the rectum and below the bladder. The bladder stores urine and the urethra is the tube that carries the urine out of the body. The prostate may get bigger asa man gets older. An enlarged prostate can press on the urethra. This can make it harder to pass urine. The build-up of urine in the bladder can cause infection. Back pressure and infection may progress to bladder damage and kidney (renal) failure. What are the causes? This condition is part of a normal aging process. However, not all men develop problems from this condition. If the prostate enlarges away from the urethra, urine flow will not be blocked. If it enlarges toward the urethra andcompresses it, there will be problems passing urine. What increases the risk? This condition is more likely to develop in men over the age of 50 years. What are the signs or symptoms? Symptoms of this condition include: Getting up often during the night to urinate. Needing to urinate frequently during the day. Difficulty starting urine flow. Decrease in size and strength of your urine stream. Leaking (dribbling) after urinating. Inability to pass urine. This needs immediate treatment. Inability to completely empty your bladder. Pain when you pass urine. This is more common if there is also an infection. Urinary tract infection (UTI). How is this diagnosed? This condition is diagnosed based on your medical history, a physical exam, and your symptoms. Tests will also be done, such as: A post-void bladder scan. This measures any amount of urine that may remain in your bladder after you finish urinating. A digital rectal exam. In a rectal exam, your health care provider checks your prostate by  putting a lubricated, gloved finger into your rectum to feel the back of your prostate gland. This exam detects the size of your gland and any abnormal lumps or growths. An exam of your urine (urinalysis). A prostate specific antigen (PSA) screening. This is a blood test used to screen for prostate cancer. An ultrasound. This test uses sound waves to electronically produce a picture of your prostate gland. Your health care provider may refer you to a specialist in kidney and prostate diseases (urologist). How is this treated? Once symptoms begin, your health care provider will monitor your condition (active surveillance or watchful waiting). Treatment for this condition will depend on the severity of your condition. Treatment may include: Observation and yearly exams. This may be the only treatment needed if your condition and symptoms are mild. Medicines to relieve your symptoms, including: Medicines to shrink the prostate. Medicines to relax the muscle of the prostate. Surgery in severe cases. Surgery may include: Prostatectomy. In this procedure, the prostate tissue is removed completely through an open incision or with a laparoscope or robotics. Transurethral resection of the prostate (TURP). In this procedure, a tool is inserted through the opening at the tip of the penis (urethra). It is used to cut away tissue of the inner core of the prostate. The pieces are removed through the same opening of the penis. This removes the blockage. Transurethral incision (TUIP). In this procedure, small cuts are made in the prostate. This lessens the prostate's pressure on the urethra. Transurethral microwave thermotherapy (TUMT). This procedure uses microwaves to create heat. The heat destroys and removes a small   amount of prostate tissue. Transurethral needle ablation (TUNA). This procedure uses radio frequencies to destroy and remove a small amount of prostate tissue. Interstitial laser coagulation (ILC).  This procedure uses a laser to destroy and remove a small amount of prostate tissue. Transurethral electrovaporization (TUVP). This procedure uses electrodes to destroy and remove a small amount of prostate tissue. Prostatic urethral lift. This procedure inserts an implant to push the lobes of the prostate away from the urethra. Follow these instructions at home: Take over-the-counter and prescription medicines only as told by your health care provider. Monitor your symptoms for any changes. Contact your health care provider with any changes. Avoid drinking large amounts of liquid before going to bed or out in public. Avoid or reduce how much caffeine or alcohol you drink. Give yourself time when you urinate. Keep all follow-up visits as told by your health care provider. This is important. Contact a health care provider if: You have unexplained back pain. Your symptoms do not get better with treatment. You develop side effects from the medicine you are taking. Your urine becomes very dark or has a bad smell. Your lower abdomen becomes distended and you have trouble passing your urine. Get help right away if: You have a fever or chills. You suddenly cannot urinate. You feel lightheaded, or very dizzy, or you faint. There are large amounts of blood or clots in the urine. Your urinary problems become hard to manage. You develop moderate to severe low back or flank pain. The flank is the side of your body between the ribs and the hip. These symptoms may represent a serious problem that is an emergency. Do not wait to see if the symptoms will go away. Get medical help right away. Call your local emergency services (911 in the [place] Do not drive yourself to the hospital. Summary Benign prostatic hyperplasia (BPH) is an enlarged prostate that is caused by the normal aging process and not by cancer. An enlarged prostate can press on the urethra. This can make it hard to pass urine. This  condition is part of a normal aging process and is more likely to develop in men over the age of 50 years. Get help right away if you suddenly cannot urinate. This information is not intended to replace advice given to you by your health care provider. Make sure you discuss any questions you have with your healthcare provider. Document Revised: 09/17/2019 Document Reviewed: 09/17/2019 Elsevier Patient Education  2022 Elsevier Inc.  

## 2020-08-12 NOTE — Progress Notes (Signed)
08/12/2020 10:54 AM   Tristan Lopez [birth year] [id number]  Referring provider: [name], MD [address] [place],  [place] [address]  Chief Complaint  Patient presents with   Urinary Incontinence    New Patient    HPI: [name] is a 87yo here for evaluation of urge incontinence,. He has noted progressive difficulty urinating over the past 5 years but the urine stream has become very week and he is not having urge incontinence for the past 6 months. He has associated urinary frequency every 1-1.5 hours, nocturia 4-5x, urinary hesitancy, straining to urinate and a feeling of incomplete emptying. No prior BPH therapy. He is currently on ditropan '10mg'$  daily. Pvr 591. IPSS 24 QOL5   PMH: Past Medical History:  Diagnosis Date   Abdominal aortic aneurysm without rupture (HCC)    Bilateral renal cysts    BPH (benign prostatic hyperplasia)    Carotid artery occlusion    Chronic atrial fibrillation (HCC)    COPD (chronic obstructive pulmonary disease) ([place])    Diverticulosis    Pancolonic   Essential hypertension    Hemorrhoids    Hiatal hernia    Hypercholesteremia    Liver cyst    Benign by liver biopsy September 2013   Nephrolithiasis    Nephrolithiasis    Pre-diabetes    Reflux esophagitis    Sleep apnea    Uses CPAP   Tubular adenoma     Surgical History: Past Surgical History:  Procedure Laterality Date   COLONOSCOPY     2003, no polyps per patient. Dr. West Carbo   COLONOSCOPY  11/01/11   Dr. Gala Romney- tubular adenoma,suboptimal preparation, internal hemorrhoids, o/w normal rectum. pancolonic diverticulosis   ESOPHAGOGASTRODUODENOSCOPY  11/01/11   Dr. Gala Romney- erosive reflux esophagitis along with a patulous EG junction, hialtal hernia, antral erosions with possible area of healing ulceration- s/p bx= chronic erosive gastritis, no malignancy   HERNIA REPAIR     x2, bilateral inguinal    KIDNEY STONE SURGERY     KNEE ARTHROSCOPY     X2   NASAL  ENDOSCOPY      Home Medications:  Allergies as of 08/12/2020       Reactions   Latex Rash        Medication List        Accurate as of August 12, 2020 10:54 AM. If you have any questions, ask your nurse or doctor.          amLODipine 10 MG tablet Commonly known as: NORVASC TAKE ONE TABLET BY MOUTH EVERY DAY   benazepril 40 MG tablet Commonly known as: LOTENSIN TAKE 1 TABLET BY MOUTH ONCE DAILY   diazepam 5 MG tablet Commonly known as: VALIUM Take 1 tablet (5 mg total) by mouth 2 (two) times daily.   DIGESTIVE ADVANTAGE PO Take by mouth 2 (two) times daily.   furosemide 20 MG tablet Commonly known as: LASIX Take 1 tablet (20 mg total) by mouth daily. Take 20 mg daily as needed for leg swelling   Garlic 123XX123 MG Caps Take 1,000 mg by mouth daily.   oxybutynin 10 MG 24 hr tablet Commonly known as: DITROPAN-XL TAKE 1 TABLET BY MOUTH ONCE DAILY What changed: when to take this   pantoprazole 40 MG tablet Commonly known as: PROTONIX TAKE 1 TABLET BY MOUTH TWICE DAILY   potassium chloride 10 MEQ tablet Commonly known as: KLOR-CON TAKE 1 TABLET BY MOUTH DAILY WHEN YOU TAKE FUROSEMIDE What changed: See the new  instructions.   predniSONE 5 MG tablet Commonly known as: DELTASONE Take 1 tablet (5 mg total) by mouth daily with breakfast.   propranolol 20 MG tablet Commonly known as: INDERAL TAKE ONE TABLET BY MOUTH DAILY What changed: when to take this   rosuvastatin 10 MG tablet Commonly known as: CRESTOR TAKE 1 TABLET BY MOUTH ONCE DAILY   silodosin 8 MG Caps capsule Commonly known as: RAPAFLO Take 1 capsule (8 mg total) by mouth daily with breakfast. Started by: Tristan Bang, MD   tadalafil 5 MG tablet Commonly known as: CIALIS TAKE 1 TABLET (5 MG TOTAL) BY MOUTH DAILY.   tiZANidine 4 MG tablet Commonly known as: Zanaflex Take 0.5 tablets (2 mg total) by mouth every 6 (six) hours as needed for muscle spasms.   vitamin C 1000 MG tablet Take  1,000 mg by mouth daily.   Vitamin D-3 25 MCG (1000 UT) Caps Take 2,000 Units by mouth.   warfarin 5 MG tablet Commonly known as: COUMADIN Take as directed by the anticoagulation clinic. If you are unsure how to take this medication, talk to your nurse or doctor. Original instructions: TAKE ONE TABLET BY MOUTH EVERY DAY What changed: when to take this        Allergies:  Allergies  Allergen Reactions   Latex Rash    Family History: Family History  Problem Relation Age of Onset   Breast cancer Mother    Other Mother        Essential tremor   Stroke Mother    Heart disease Father        Pacemaker   Heart attack Maternal Grandfather    Colon cancer Neg Hx    Liver disease Neg Hx    Prostate cancer Neg Hx    Bladder Cancer Neg Hx    Kidney cancer Neg Hx     Social History:  reports that he quit smoking about 30 years ago. His smoking use included cigarettes. He smoked an average of .5 packs per day. He has never used smokeless tobacco. He reports that he does not drink alcohol and does not use drugs.  ROS: All other review of systems were reviewed and are negative except what is noted above in HPI  Physical Exam: BP (!) 158/78   Pulse 73   Temp 98.4 F (36.9 C)   Constitutional:  Alert and oriented, No acute distress. HEENT: [place] AT, moist mucus membranes.  Trachea midline, no masses. Cardiovascular: No clubbing, cyanosis, or edema. Respiratory: Normal respiratory effort, no increased work of breathing. GI: Abdomen is soft, nontender, nondistended, no abdominal masses GU: No CVA tenderness.  Lymph: No cervical or inguinal lymphadenopathy. Skin: No rashes, bruises or suspicious lesions. Neurologic: Grossly intact, no focal deficits, moving all 4 extremities. Psychiatric: Normal mood and affect.  Laboratory Data: Lab Results  Component Value Date   WBC 4.6 06/09/2020   HGB 11.7 (L) 06/09/2020   HCT 37.3 (L) 06/09/2020   MCV 95.9 06/09/2020   PLT 158 06/09/2020     Lab Results  Component Value Date   CREATININE 0.99 06/09/2020    No results found for: PSA  No results found for: TESTOSTERONE  No results found for: HGBA1C  Urinalysis    Component Value Date/Time   COLORURINE YELLOW 06/09/2020 0424   APPEARANCEUR CLEAR 06/09/2020 0424   LABSPEC 1.009 06/09/2020 0424   PHURINE 6.0 06/09/2020 0424   GLUCOSEU NEGATIVE 06/09/2020 0424   HGBUR NEGATIVE 06/09/2020 0424   BILIRUBINUR NEGATIVE 06/09/2020  [place] 06/09/2020 0424   PROTEINUR NEGATIVE 06/09/2020 0424   UROBILINOGEN 0.2 10/04/2011 2218   NITRITE NEGATIVE 06/09/2020 0424   LEUKOCYTESUR NEGATIVE 06/09/2020 0424    Lab Results  Component Value Date   BACTERIA FEW (A) 04/01/2015    Pertinent Imaging:  No results found for this or any previous visit.  No results found for this or any previous visit.  No results found for this or any previous visit.   No results found for this or any previous visit.  No results found for this or any previous visit.  No results found for this or any previous visit.  No results found for this or any previous visit.  No results found for this or any previous visit.   Assessment & Plan:    1. Urinary incontinence, unspecified type -likely related to urinary retention - Urinalysis, Routine w reflex microscopic - BLADDER SCAN AMB NON-IMAGING  2. Benign prostatic hyperplasia with urinary obstruction -We will start rapaflo '8mg'$  daily. We will stop ditropanxl  3. Urinary retention -rapaflo '8mg'$  daily    No follow-ups on file.  Tristan Bang, MD  [facility] [place]

## 2020-08-22 DIAGNOSIS — I4821 Permanent atrial fibrillation: Secondary | ICD-10-CM | POA: Diagnosis not present

## 2020-08-22 DIAGNOSIS — Z5181 Encounter for therapeutic drug level monitoring: Secondary | ICD-10-CM | POA: Diagnosis not present

## 2020-08-23 ENCOUNTER — Ambulatory Visit (INDEPENDENT_AMBULATORY_CARE_PROVIDER_SITE_OTHER): Payer: Medicare PPO | Admitting: *Deleted

## 2020-08-23 ENCOUNTER — Other Ambulatory Visit (INDEPENDENT_AMBULATORY_CARE_PROVIDER_SITE_OTHER): Payer: Self-pay | Admitting: Nurse Practitioner

## 2020-08-23 ENCOUNTER — Other Ambulatory Visit (INDEPENDENT_AMBULATORY_CARE_PROVIDER_SITE_OTHER): Payer: Self-pay | Admitting: Internal Medicine

## 2020-08-23 DIAGNOSIS — I4821 Permanent atrial fibrillation: Secondary | ICD-10-CM | POA: Diagnosis not present

## 2020-08-23 DIAGNOSIS — Z5181 Encounter for therapeutic drug level monitoring: Secondary | ICD-10-CM | POA: Diagnosis not present

## 2020-08-23 LAB — PROTIME-INR
INR: 2.8 — AB (ref 0.9–1.1)
INR: 2.8 — AB (ref 0.9–1.1)
INR: 3.8 — AB (ref 0.9–1.1)
INR: 3.8 — ABNORMAL HIGH (ref 0.9–1.2)
Prothrombin Time: 37 s — ABNORMAL HIGH (ref 9.1–12.0)

## 2020-08-23 LAB — POCT INR: INR: 3.8 — AB (ref 2.0–3.0)

## 2020-08-23 NOTE — Patient Instructions (Signed)
Pt is in independent living facility in [place].  Get labs done at [facility] on [address].  Son Dominica Severin manages pts medicine.  Lab orders are mailed to [facility] address. Hold warfarin tonight then resume warfarin 5 mg daily. Recheck in 3 weeks.

## 2020-08-31 IMAGING — DX DG CHEST 2V
2 series · 2 of 2 positions shown · non-contrast
Comparison: Chest radiograph dated 04/01/2015

CLINICAL DATA: 88-year-old male with cough.

EXAM:
CHEST - 2 VIEW

[chest pa]
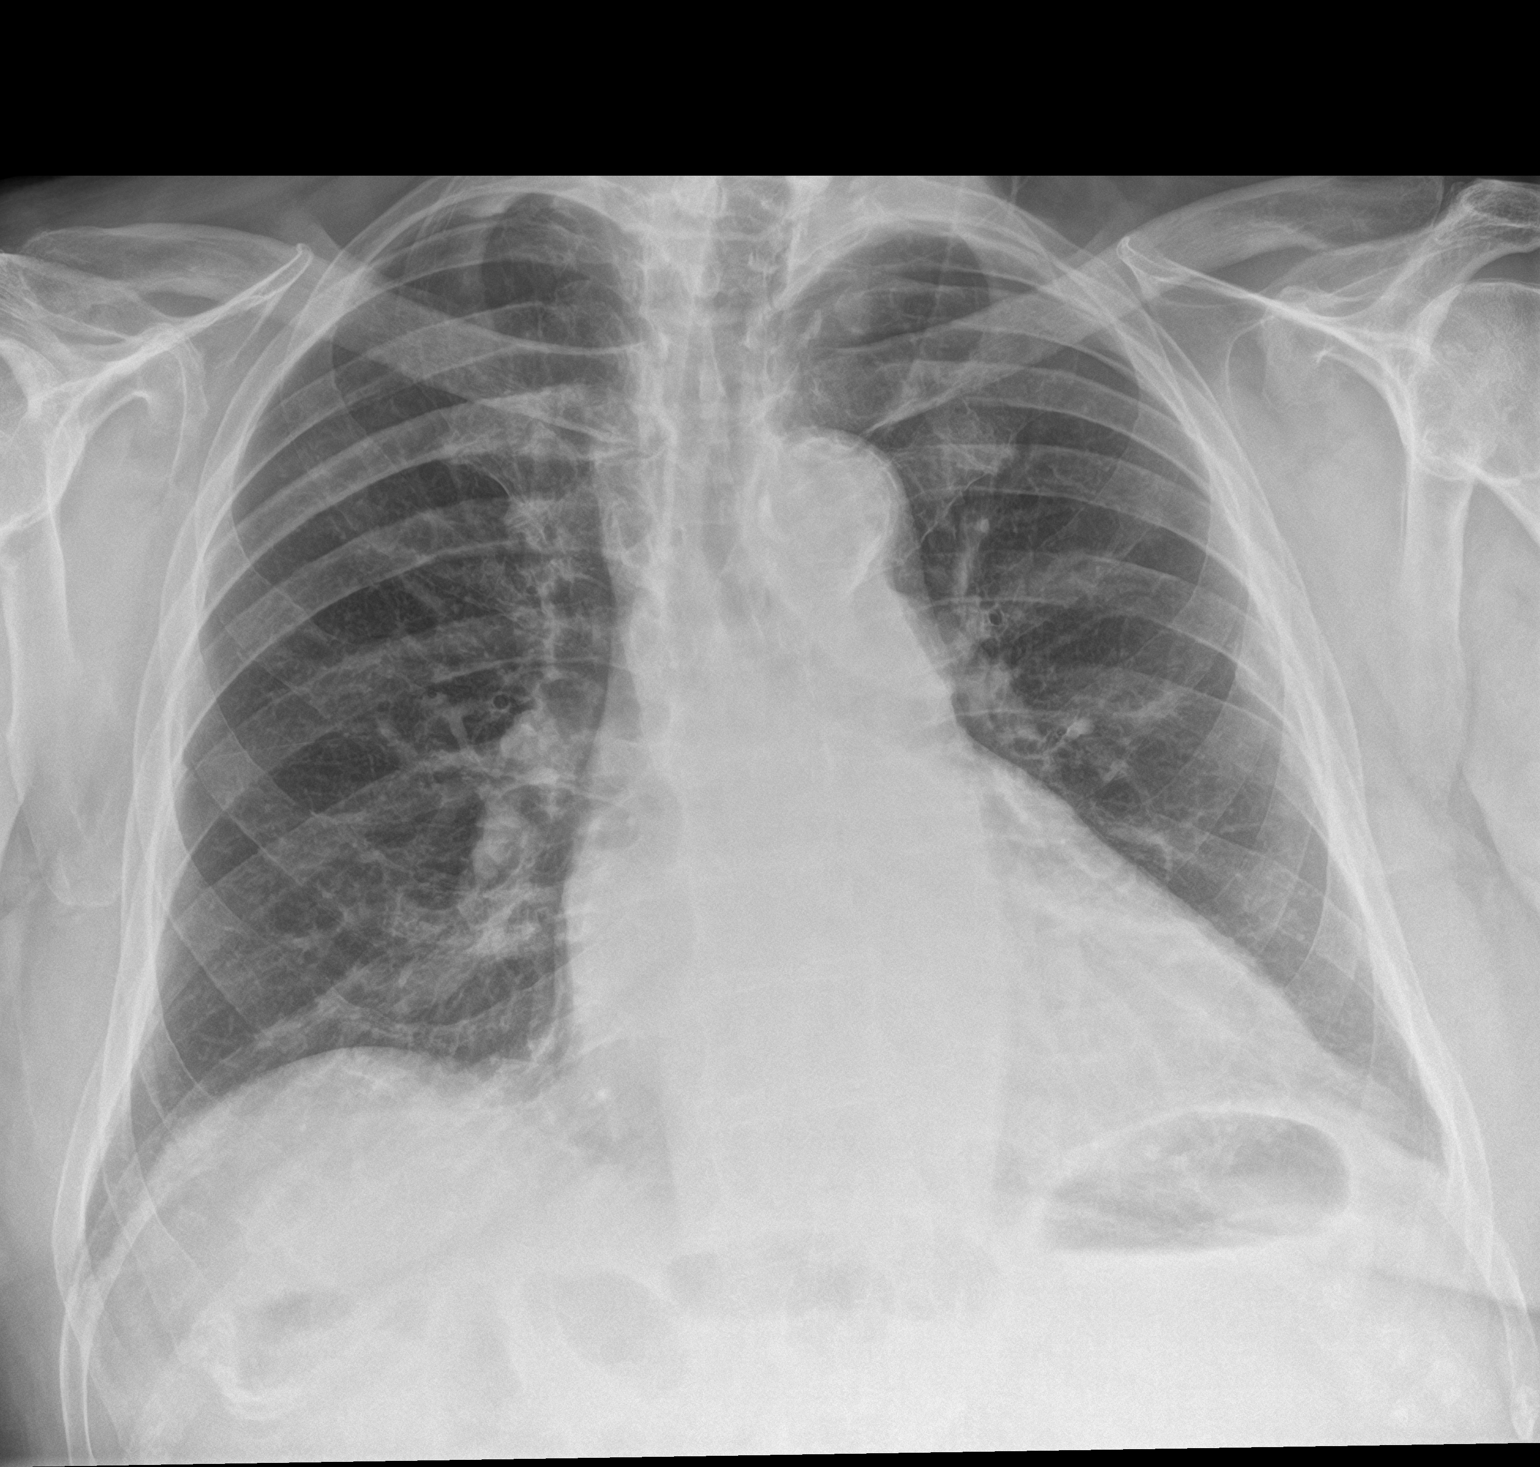

[chest lat]
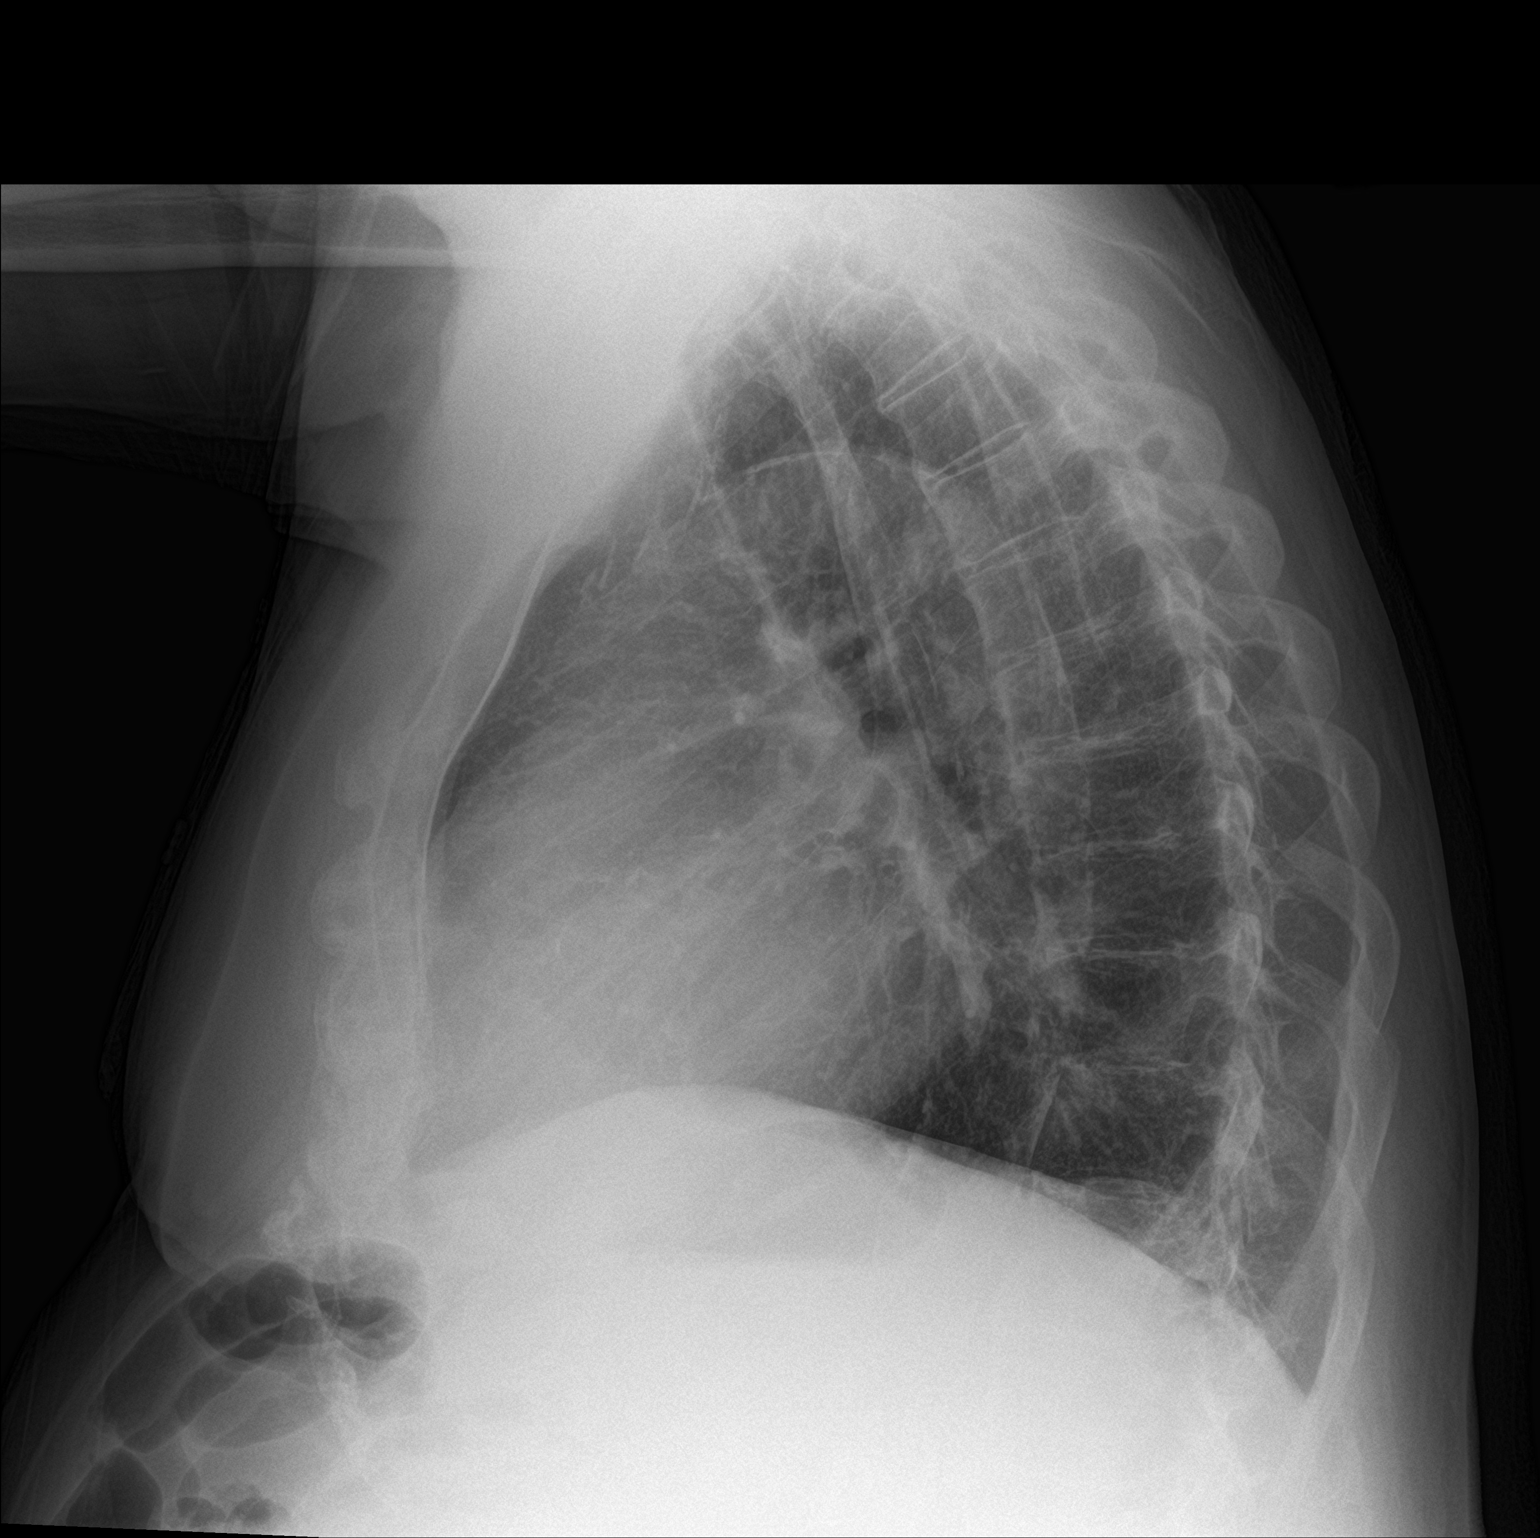

[2 of 2 positions shown; findings below may reference images not displayed]

FINDINGS: Minimal bibasilar streaky densities may represent atelectasis.
Developing infiltrate is less likely but not excluded. Clinical
correlation is recommended. No focal consolidation, pleural
effusion, or pneumothorax. Mild cardiomegaly. Atherosclerotic
calcification of the aorta. No acute osseous pathology.
IMPRESSION: No acute cardiopulmonary process.  Probable bibasilar atelectasis.

## 2020-09-20 ENCOUNTER — Ambulatory Visit (INDEPENDENT_AMBULATORY_CARE_PROVIDER_SITE_OTHER): Payer: Medicare PPO | Admitting: Internal Medicine

## 2020-09-21 ENCOUNTER — Telehealth: Payer: Self-pay | Admitting: *Deleted

## 2020-09-21 ENCOUNTER — Encounter: Payer: Self-pay | Admitting: Urology

## 2020-09-21 ENCOUNTER — Ambulatory Visit: Payer: Medicare PPO | Admitting: Urology

## 2020-09-21 ENCOUNTER — Ambulatory Visit (INDEPENDENT_AMBULATORY_CARE_PROVIDER_SITE_OTHER): Payer: Medicare PPO | Admitting: Internal Medicine

## 2020-09-21 ENCOUNTER — Other Ambulatory Visit: Payer: Self-pay

## 2020-09-21 VITALS — BP 101/59 | HR 71

## 2020-09-21 DIAGNOSIS — N138 Other obstructive and reflux uropathy: Secondary | ICD-10-CM | POA: Diagnosis not present

## 2020-09-21 DIAGNOSIS — I4821 Permanent atrial fibrillation: Secondary | ICD-10-CM

## 2020-09-21 DIAGNOSIS — Z5181 Encounter for therapeutic drug level monitoring: Secondary | ICD-10-CM

## 2020-09-21 DIAGNOSIS — R339 Retention of urine, unspecified: Secondary | ICD-10-CM

## 2020-09-21 DIAGNOSIS — N401 Enlarged prostate with lower urinary tract symptoms: Secondary | ICD-10-CM

## 2020-09-21 DIAGNOSIS — R32 Unspecified urinary incontinence: Secondary | ICD-10-CM

## 2020-09-21 LAB — URINALYSIS, ROUTINE W REFLEX MICROSCOPIC
Bilirubin, UA: NEGATIVE
Glucose, UA: NEGATIVE
Ketones, UA: NEGATIVE
Leukocytes,UA: NEGATIVE
Nitrite, UA: NEGATIVE
Protein,UA: NEGATIVE
RBC, UA: NEGATIVE
Specific Gravity, UA: 1.015 (ref 1.005–1.030)
Urobilinogen, Ur: 0.2 mg/dL (ref 0.2–1.0)
pH, UA: 6.5 (ref 5.0–7.5)

## 2020-09-21 LAB — BLADDER SCAN AMB NON-IMAGING: Scan Result: 589

## 2020-09-21 NOTE — Telephone Encounter (Signed)
Pt did not have his INR done today because they told him they did not have an order, will you please send another order.   They will be able to go next week to take him.

## 2020-09-21 NOTE — Patient Instructions (Signed)
Urodynamic Testing  What is urodynamic testing?      Urodynamic testing is a set of tests and X-rays. These tests help to find out how well your bladder and the part of your body that drains pee from the bladder (urethra) are working.  Why do I need this testing?  You may need these tests if you:   Are leaking pee (urine).   Have trouble starting or stopping peeing (urination).   Pee often or it hurts to pee.   Have urinary tract infections often.   Are not able to empty your bladder.   Have strong urges to pee.   Have a weak flow of pee.  How do I prepare for the tests?   Ask your doctor about:  ? Changing or stopping your normal medicines. This is important if you take diabetes medicines or blood thinners.  ? Whether you should arrive for the test having to pee.   Tell your doctor about:  ? Any allergies you have.  ? All medicines you are taking, including vitamins, herbs, eye drops, creams, and over-the-counter medicines.  ? Whether you are pregnant or may be pregnant.  What are the risks?  In general, these tests are safe. But some of the tests have risks. These may include:   Discomfort.   Feeling a need to pee often.   Bleeding.   Infection.   Allergic reactions to medicines or dyes.  How are the tests done?  The tests may be done in one visit or may be done over a few visits. You may be given an antibiotic medicine to help keep you from getting an infection.  The types of tests done may include:  Uroflowmetry  This test measures how much you pee and how long it takes. You will pee into a type of toilet or device that sends measurements to a computer.  Postvoid residual measurement  This test measures how much pee is left in your bladder after you pee. It may be done by:   Using sound waves and a computer to create pictures of your bladder (ultrasound).   Putting a thin, flexible tube (catheter) into your bladder to take out the pee that is left so it can be measured.  Cystometric  testing  This test measures how much pressure there is in your bladder before you pee and as you pee.   You may be given a medicine to numb the area (local anesthetic).   The area around the opening of your urethra will be cleaned.   A thin, flexible tube will be used to empty your bladder.   A flexible tube that can measure pressure will then be placed. Your bladder will be filled with germ-free water.   Pressure will be measured:  ? As your bladder fills.  ? When you feel the need to pee.  ? As your bladder is emptied.   In some cases, your bladder may be filled with a dye that shows up on X-rays (contrast material) so that X-ray pictures can be taken during the test.  Electromyogram  This test measures the activity of the nerves and muscles in your bladder and in the tube that empties your bladder. Sticky patches (electrodes) will be placed on your body to measure electrical activity.  What happens after the testing?   You should be able to go home right away.   You can do your usual activities.   You may be told to drink a glass   of water every 30 minutes. Do this for the first 2 hours you are home.   Taking a warm bath or using a warm, wet towel may relieve any discomfort.  Let your doctor know if you have:   Pain.   Blood in your pee.   Chills.   Fever.  What do my test results mean?  Talk with your doctor about what your results mean. These test results can help your doctor find out how well your bladder and the tube that empties your bladder are working. Your results and your symptoms can help your doctor find what might be causing your problems.  Questions to ask your doctor  Ask your doctor, or the department that is doing the test:   When will my results be ready?   How will I get my results?   What are my treatment options?   What other tests do I need?   What are my next steps?  Summary   Urodynamic testing is a set of tests and X-rays.   These tests help to find out how well your  bladder and the part of your body that drains pee from the bladder are working.   Your results and your symptoms can help your doctor find what might be causing your problems.   Talk with your doctor about what your results mean.  This information is not intended to replace advice given to you by your health care provider. Make sure you discuss any questions you have with your health care provider.  Document Revised: 04/29/2018 Document Reviewed: 11/12/2016  Elsevier Patient Education  2021 Elsevier Inc.

## 2020-09-21 NOTE — Progress Notes (Signed)
post void residual=589  Urological Symptom Review  Patient is experiencing the following symptoms: Frequent urination Get up at night to urinate Weak stream   Review of Systems  Gastrointestinal (upper)  : Negative for upper GI symptoms  Gastrointestinal (lower) : Negative for lower GI symptoms  Constitutional : Negative for symptoms  Skin: Negative for skin symptoms  Eyes: Negative for eye symptoms  Ear/Nose/Throat : Negative for Ear/Nose/Throat symptoms  Hematologic/Lymphatic: Negative for Hematologic/Lymphatic symptoms  Cardiovascular : Negative for cardiovascular symptoms  Respiratory : Negative for respiratory symptoms  Endocrine: Negative for endocrine symptoms  Musculoskeletal: Negative for musculoskeletal symptoms  Neurological: Negative for neurological symptoms  Psychologic: Negative for psychiatric symptoms

## 2020-09-21 NOTE — Progress Notes (Signed)
09/21/2020 3:01 PM   Tristan Lopez 03/25/1930 [id number]  Referring provider: Ailene Ards, NP [address] [place],  [place] [address]  Followup urinary retention   HPI: Tristan Lopez is a 87yo here for followup for urinary retention. PVR 589. He is currently on rapaflo '8mg'$  daily. He has severe LUTS even with rapaflo. IPSS 29 QOL 5. Nocturia 4-5x. He has urgency with daily urge incontinence. He has associated urinary hesitancy which is worse at night. No other complaints today   PMH: Past Medical History:  Diagnosis Date   Abdominal aortic aneurysm without rupture (HCC)    Bilateral renal cysts    BPH (benign prostatic hyperplasia)    Carotid artery occlusion    Chronic atrial fibrillation (HCC)    COPD (chronic obstructive pulmonary disease) ([place])    Diverticulosis    Pancolonic   Essential hypertension    Hemorrhoids    Hiatal hernia    Hypercholesteremia    Liver cyst    Benign by liver biopsy September 2013   Nephrolithiasis    Nephrolithiasis    Pre-diabetes    Reflux esophagitis    Sleep apnea    Uses CPAP   Tubular adenoma     Surgical History: Past Surgical History:  Procedure Laterality Date   COLONOSCOPY     2003, no polyps per patient. Dr. West Lopez   COLONOSCOPY  11/01/11   Dr. Gala Lopez- tubular adenoma,suboptimal preparation, internal hemorrhoids, o/w normal rectum. pancolonic diverticulosis   ESOPHAGOGASTRODUODENOSCOPY  11/01/11   Dr. Gala Lopez- erosive reflux esophagitis along with a patulous EG junction, hialtal hernia, antral erosions with possible area of healing ulceration- s/p bx= chronic erosive gastritis, no malignancy   HERNIA REPAIR     x2, bilateral inguinal    KIDNEY STONE SURGERY     KNEE ARTHROSCOPY     X2   NASAL ENDOSCOPY      Home Medications:  Allergies as of 09/21/2020       Reactions   Latex Rash        Medication List        Accurate as of September 21, 2020  3:01 PM. If you have any questions, ask your nurse or  doctor.          amLODipine 10 MG tablet Commonly known as: NORVASC TAKE ONE TABLET BY MOUTH EVERY DAY   benazepril 40 MG tablet Commonly known as: LOTENSIN Take 1 tablet (40 mg total) by mouth daily.   diazepam 5 MG tablet Commonly known as: VALIUM Take 1 tablet (5 mg total) by mouth 2 (two) times daily.   DIGESTIVE ADVANTAGE PO Take by mouth 2 (two) times daily.   furosemide 20 MG tablet Commonly known as: LASIX Take 1 tablet (20 mg total) by mouth daily. Take 20 mg daily as needed for leg swelling   Garlic 123XX123 MG Caps Take 1,000 mg by mouth daily.   oxybutynin 10 MG 24 hr tablet Commonly known as: DITROPAN-XL TAKE 1 TABLET BY MOUTH ONCE DAILY   pantoprazole 40 MG tablet Commonly known as: PROTONIX TAKE 1 TABLET BY MOUTH TWICE DAILY   potassium chloride 10 MEQ tablet Commonly known as: KLOR-CON TAKE 1 TABLET BY MOUTH DAILY WHEN YOU TAKE FUROSEMIDE What changed: See the new instructions.   predniSONE 5 MG tablet Commonly known as: DELTASONE Take 1 tablet (5 mg total) by mouth daily with breakfast.   propranolol 20 MG tablet Commonly known as: INDERAL TAKE ONE TABLET BY MOUTH DAILY What changed: when  to take this   rosuvastatin 10 MG tablet Commonly known as: CRESTOR Take 1 tablet (10 mg total) by mouth daily.   silodosin 8 MG Caps capsule Commonly known as: RAPAFLO Take 1 capsule (8 mg total) by mouth daily with breakfast.   tadalafil 5 MG tablet Commonly known as: CIALIS TAKE 1 TABLET (5 MG TOTAL) BY MOUTH DAILY.   tiZANidine 4 MG tablet Commonly known as: Zanaflex Take 0.5 tablets (2 mg total) by mouth every 6 (six) hours as needed for muscle spasms.   vitamin C 1000 MG tablet Take 1,000 mg by mouth daily.   Vitamin D-3 25 MCG (1000 UT) Caps Take 2,000 Units by mouth.   warfarin 5 MG tablet Commonly known as: COUMADIN Take as directed by the anticoagulation clinic. If you are unsure how to take this medication, talk to your nurse or  doctor. Original instructions: TAKE ONE TABLET BY MOUTH EVERY DAY        Allergies:  Allergies  Allergen Reactions   Latex Rash    Family History: Family History  Problem Relation Age of Onset   Breast cancer Mother    Other Mother        Essential tremor   Stroke Mother    Heart disease Father        Pacemaker   Heart attack Maternal Grandfather    Colon cancer Neg Hx    Liver disease Neg Hx    Prostate cancer Neg Hx    Bladder Cancer Neg Hx    Kidney cancer Neg Hx     Social History:  reports that he quit smoking about 30 years ago. His smoking use included cigarettes. He smoked an average of .5 packs per day. He has never used smokeless tobacco. He reports that he does not drink alcohol and does not use drugs.  ROS: All other review of systems were reviewed and are negative except what is noted above in HPI  Physical Exam: BP (!) 101/59   Pulse 71   Constitutional:  Alert and oriented, No acute distress. HEENT: [place] AT, moist mucus membranes.  Trachea midline, no masses. Cardiovascular: No clubbing, cyanosis, or edema. Respiratory: Normal respiratory effort, no increased work of breathing. GI: Abdomen is soft, nontender, nondistended, no abdominal masses GU: No CVA tenderness.  Lymph: No cervical or inguinal lymphadenopathy. Skin: No rashes, bruises or suspicious lesions. Neurologic: Grossly intact, no focal deficits, moving all 4 extremities. Psychiatric: Normal mood and affect.  Laboratory Data: Lab Results  Component Value Date   WBC 4.6 06/09/2020   HGB 11.7 (L) 06/09/2020   HCT 37.3 (L) 06/09/2020   MCV 95.9 06/09/2020   PLT 158 06/09/2020    Lab Results  Component Value Date   CREATININE 0.99 06/09/2020    No results found for: PSA  No results found for: TESTOSTERONE  No results found for: HGBA1C  Urinalysis    Component Value Date/Time   COLORURINE YELLOW 06/09/2020 0424   APPEARANCEUR Clear 08/12/2020 1031   LABSPEC 1.009 06/09/2020  0424   PHURINE 6.0 06/09/2020 0424   GLUCOSEU Negative 08/12/2020 1031   HGBUR NEGATIVE 06/09/2020 0424   BILIRUBINUR Negative 08/12/2020 1031   KETONESUR NEGATIVE 06/09/2020 0424   PROTEINUR Negative 08/12/2020 1031   PROTEINUR NEGATIVE 06/09/2020 0424   UROBILINOGEN 0.2 10/04/2011 2218   NITRITE Negative 08/12/2020 1031   NITRITE NEGATIVE 06/09/2020 0424   LEUKOCYTESUR Trace (A) 08/12/2020 1031   LEUKOCYTESUR NEGATIVE 06/09/2020 0424    Lab Results  Component  Value Date   LABMICR See below: 08/12/2020   WBCUA 0-5 08/12/2020   LABEPIT None seen 08/12/2020   MUCUS Present 08/12/2020   BACTERIA None seen 08/12/2020    Pertinent Imaging:  No results found for this or any previous visit.  No results found for this or any previous visit.  No results found for this or any previous visit.  No results found for this or any previous visit.  No results found for this or any previous visit.  No results found for this or any previous visit.  No results found for this or any previous visit.  No results found for this or any previous visit.   Assessment & Plan:    1. Urinary retention -foley catheter placed today. We will schedule for UDS - BLADDER SCAN AMB NON-IMAGING  2. Urinary incontinence, unspecified type -scheduled for UDS  3. Benign prostatic hyperplasia with urinary obstruction -Schedule for UDS - Urinalysis, Routine w reflex microscopic   No follow-ups on file.  Nicolette Bang, MD  [facility] [place]

## 2020-09-21 NOTE — Progress Notes (Signed)
Simple Catheter Placement  Due to urinary retention patient is present today for a foley cath placement.  Patient was cleaned and prepped in a sterile fashion with betadine. A 16 FR foley catheter was inserted, urine return was noted  565m, urine was yellow in color.  The balloon was filled with 10cc of sterile water.  A leg bag was attached for drainage. Patient was also given a night bag to take home and was given instruction on how to change from one bag to another.  Patient was given instruction on proper catheter care.  Patient tolerated well, no complications were noted   Performed by: AEstill BambergRN  Additional notes/ Follow up: post urodynamic

## 2020-09-22 NOTE — Telephone Encounter (Signed)
Spoke with son Dominica Severin.  New standing orders for PT/INR at [facility] placed in [place].  Copy E-mailed to son as well.  Will have drawn next week.

## 2020-09-29 ENCOUNTER — Telehealth: Payer: Self-pay

## 2020-09-29 NOTE — Telephone Encounter (Signed)
Wants to know how often will his catheter need o be changed. Came to office to pick up a new bag since the one he has broke. Son is concerned for patient since patient in Coumadin and afraid of bleeding.  Please call son Dominica Severin back asap to advise at [phone].  Thanks, Helene Kelp

## 2020-09-30 ENCOUNTER — Ambulatory Visit (INDEPENDENT_AMBULATORY_CARE_PROVIDER_SITE_OTHER): Payer: Medicare PPO | Admitting: *Deleted

## 2020-09-30 DIAGNOSIS — Z5181 Encounter for therapeutic drug level monitoring: Secondary | ICD-10-CM | POA: Diagnosis not present

## 2020-09-30 DIAGNOSIS — I4821 Permanent atrial fibrillation: Secondary | ICD-10-CM

## 2020-09-30 LAB — PROTIME-INR
INR: 3.9 — ABNORMAL HIGH (ref 0.9–1.2)
Prothrombin Time: 38.5 s — ABNORMAL HIGH (ref 9.1–12.0)

## 2020-09-30 NOTE — Telephone Encounter (Signed)
Left message for son patient could come by and pick up bag needed and to return call to office.

## 2020-09-30 NOTE — Patient Instructions (Signed)
Pt is in independent living facility in [place].  Get labs done at [facility] on [address].  Son Dominica Severin manages pts medicine.  Lab orders are mailed to [facility] address. Hold warfarin tonight then decrease dose to warfarin 5 mg daily except 2.'5mg'$  on Sundays and Wednesdays Recheck in 3 weeks. Instructions given to son Dominica Severin Lab order Emailed to [email or web address]

## 2020-10-18 ENCOUNTER — Ambulatory Visit: Payer: Medicare PPO | Admitting: Nurse Practitioner

## 2020-10-18 ENCOUNTER — Telehealth: Payer: Self-pay | Admitting: Cardiology

## 2020-10-18 ENCOUNTER — Encounter: Payer: Self-pay | Admitting: Nurse Practitioner

## 2020-10-18 ENCOUNTER — Other Ambulatory Visit: Payer: Self-pay

## 2020-10-18 VITALS — BP 114/66 | HR 102 | Temp 97.5°F | Ht 69.0 in | Wt 221.0 lb

## 2020-10-18 DIAGNOSIS — J449 Chronic obstructive pulmonary disease, unspecified: Secondary | ICD-10-CM

## 2020-10-18 DIAGNOSIS — I714 Abdominal aortic aneurysm, without rupture, unspecified: Secondary | ICD-10-CM

## 2020-10-18 DIAGNOSIS — Z7689 Persons encountering health services in other specified circumstances: Secondary | ICD-10-CM

## 2020-10-18 DIAGNOSIS — Z79899 Other long term (current) drug therapy: Secondary | ICD-10-CM

## 2020-10-18 DIAGNOSIS — I10 Essential (primary) hypertension: Secondary | ICD-10-CM | POA: Diagnosis not present

## 2020-10-18 DIAGNOSIS — I482 Chronic atrial fibrillation, unspecified: Secondary | ICD-10-CM | POA: Diagnosis not present

## 2020-10-18 DIAGNOSIS — Z23 Encounter for immunization: Secondary | ICD-10-CM

## 2020-10-18 DIAGNOSIS — E785 Hyperlipidemia, unspecified: Secondary | ICD-10-CM

## 2020-10-18 NOTE — Assessment & Plan Note (Signed)
-  coumadin managed by coumadin clinic

## 2020-10-18 NOTE — Telephone Encounter (Signed)
When is the next time he needs to have his coumadin checked.

## 2020-10-18 NOTE — Assessment & Plan Note (Signed)
BP Readings from Last 3 Encounters:  10/18/20 114/66  09/21/20 (!) 101/59  08/12/20 (!) 158/78   -well controlled today

## 2020-10-18 NOTE — Assessment & Plan Note (Signed)
-  followed by Dr. Domenic Polite -propranolol for rate control -takes coumadin for anticoagulation

## 2020-10-18 NOTE — Telephone Encounter (Signed)
LMOM tthat pt's next INR is due wk of 10/24/20 and that lab orders were mailed to [place] other son.

## 2020-10-18 NOTE — Progress Notes (Signed)
New Patient Office Visit  Subjective:  Patient ID: Tristan Lopez, male    DOB: [birth year]  Age: 87 y.o. MRN: [id number]  CC:  Chief Complaint  Patient presents with   New Patient (Initial Visit)    Establish care catheter follows with urologist next week     HPI Tristan Lopez presents for new patient visit. Transferring care from [facility]. Last physical/AWV was completed 03/17/20. Last labs with a lipid panel were drawn in July of 2021.  He is followed by Dr. Alyson Ingles with urology for urinary incontinence and urinary retention. He has a foley catheter in place now.  He is followed by cardiology Domenic Polite) for a-fib, and he has coumadin dosed by cardiology. He also has a AAA that he has imaging on about every 2 years, and his last imaging was the summer of 2022.  He has seen Dr. Gala Romney for GERD.  He has seen dermatology in the past for basal cell skin cancers.  Past Medical History:  Diagnosis Date   Abdominal aortic aneurysm without rupture (HCC)    Bilateral renal cysts    BPH (benign prostatic hyperplasia)    Carotid artery occlusion    Chronic atrial fibrillation (HCC)    COPD (chronic obstructive pulmonary disease) ([place])    Diverticulosis    Pancolonic   Essential hypertension    Hemorrhoids    Hiatal hernia    Hx of adenomatous colonic polyps 01/17/2017   Hypercholesteremia    Liver cyst    Benign by liver biopsy September 2013   Nephrolithiasis    Nephrolithiasis    Pre-diabetes    Reflux esophagitis    Sleep apnea    Uses CPAP   Tubular adenoma     Past Surgical History:  Procedure Laterality Date   COLONOSCOPY     2003, no polyps per patient. Dr. West Carbo   COLONOSCOPY  11/01/11   Dr. Gala Romney- tubular adenoma,suboptimal preparation, internal hemorrhoids, o/w normal rectum. pancolonic diverticulosis   ESOPHAGOGASTRODUODENOSCOPY  11/01/11   Dr. Gala Romney- erosive reflux esophagitis along with a patulous EG junction, hialtal hernia, antral erosions with  possible area of healing ulceration- s/p bx= chronic erosive gastritis, no malignancy   HERNIA REPAIR     x2, bilateral inguinal    KIDNEY STONE SURGERY     KNEE ARTHROSCOPY     X2   NASAL ENDOSCOPY      Family History  Problem Relation Age of Onset   Breast cancer Mother    Other Mother        Essential tremor   Stroke Mother    Heart disease Father        Pacemaker   Heart attack Maternal Grandfather    Colon cancer Neg Hx    Liver disease Neg Hx    Prostate cancer Neg Hx    Bladder Cancer Neg Hx    Kidney cancer Neg Hx     Social History   Socioeconomic History   Marital status: Married    Spouse name: Not on file   Number of children: 3   Years of education: Not on file   Highest education level: Not on file  Occupational History    Employer: RETIRED   Occupation: Retired- Radiation protection practitioner and farming  Tobacco Use   Smoking status: Former    Packs/day: 0.50    Types: Cigarettes    Quit date: 1992    Years since quitting: 30.7   Smokeless tobacco: Never  Media planner  Vaping Use: Never used  Substance and Sexual Activity   Alcohol use: No   Drug use: No   Sexual activity: Not Currently  Other Topics Concern   Not on file  Social History Narrative   Married for 68 years.Retired.No longer in Assisted Living, he came home after wife went to memory care. [place] for memory care as of Dec 2021.   Social Determinants of Health   Financial Resource Strain: Not on file  Food Insecurity: Not on file  Transportation Needs: Not on file  Physical Activity: Not on file  Stress: Not on file  Social Connections: Not on file  Intimate Partner Violence: Not on file    ROS Review of Systems  Constitutional: Negative.   Respiratory: Negative.    Cardiovascular: Negative.   Genitourinary:        Catheter in place for urinary retention  Psychiatric/Behavioral: Negative.     Objective:   Today's Vitals: BP 114/66 (BP Location: Right  Arm, Patient Position: Sitting, Cuff Size: Large)   Pulse (!) 102   Temp (!) 97.5 F (36.4 C) (Oral)   Ht 5' 9"  (1.753 m)   Wt 221 lb 0.6 oz (100.3 kg)   SpO2 95%   BMI 32.64 kg/m   Physical Exam Constitutional:      Appearance: Normal appearance.  Cardiovascular:     Rate and Rhythm: Normal rate. Rhythm irregular.     Pulses: Normal pulses.     Heart sounds: Normal heart sounds.  Pulmonary:     Effort: Pulmonary effort is normal.     Breath sounds: Normal breath sounds.  Musculoskeletal:     Comments: Uses cane for ambulation  Neurological:     Mental Status: He is alert.  Psychiatric:        Mood and Affect: Mood normal.        Behavior: Behavior normal.        Thought Content: Thought content normal.        Judgment: Judgment normal.    Assessment & Plan:   Problem List Items Addressed This Visit       Cardiovascular and Mediastinum   Chronic atrial fibrillation ([place])    -followed by Dr. Domenic Polite -propranolol for rate control -takes coumadin for anticoagulation      Essential hypertension    BP Readings from Last 3 Encounters:  10/18/20 114/66  09/21/20 (!) 101/59  08/12/20 (!) 158/78  -well controlled today      Relevant Orders   CBC with Differential/Platelet   Lipid Panel With LDL/HDL Ratio   CMP14+EGFR   AAA (abdominal aortic aneurysm) without rupture ([place])    -followed by Dr. Domenic Polite for this -06/06/20 CTA showed Fusiform infrarenal abdominal aortic aneurysm measuring up to 3.8 cm, increased from 3.5 cm in 2015.        Respiratory   COPD (chronic obstructive pulmonary disease) (HCC)     Other   High risk medication use    -coumadin managed by coumadin clinic      Hyperlipidemia    -check lipids with next set of labs      Relevant Orders   CBC with Differential/Platelet   Lipid Panel With LDL/HDL Ratio   CMP14+EGFR   Other Visit Diagnoses     Establishing care with new doctor, encounter for    -  Primary   Relevant Orders   CBC  with Differential/Platelet   Lipid Panel With LDL/HDL Ratio   CMP14+EGFR  Outpatient Encounter Medications as of 10/18/2020  Medication Sig   Ascorbic Acid (VITAMIN C) 1000 MG tablet Take 1,000 mg by mouth daily.   benazepril (LOTENSIN) 40 MG tablet Take 1 tablet (40 mg total) by mouth daily.   Cholecalciferol (VITAMIN D-3) 1000 units CAPS Take 2,000 Units by mouth.   furosemide (LASIX) 20 MG tablet Take 1 tablet (20 mg total) by mouth daily. Take 20 mg daily as needed for leg swelling   Garlic 0379 MG CAPS Take 1,000 mg by mouth daily.   potassium chloride (KLOR-CON) 10 MEQ tablet TAKE 1 TABLET BY MOUTH DAILY WHEN YOU TAKE FUROSEMIDE (Patient taking differently: Take 10 mEq by mouth daily.)   predniSONE (DELTASONE) 5 MG tablet Take 1 tablet (5 mg total) by mouth daily with breakfast.   Probiotic Product (DIGESTIVE ADVANTAGE PO) Take by mouth 2 (two) times daily.    propranolol (INDERAL) 20 MG tablet TAKE ONE TABLET BY MOUTH DAILY (Patient taking differently: Take 20 mg by mouth daily at 6 (six) AM.)   rosuvastatin (CRESTOR) 10 MG tablet Take 1 tablet (10 mg total) by mouth daily.   silodosin (RAPAFLO) 8 MG CAPS capsule Take 1 capsule (8 mg total) by mouth daily with breakfast.   warfarin (COUMADIN) 5 MG tablet TAKE ONE TABLET BY MOUTH EVERY DAY   amLODipine (NORVASC) 10 MG tablet TAKE ONE TABLET BY MOUTH EVERY DAY   pantoprazole (PROTONIX) 40 MG tablet TAKE 1 TABLET BY MOUTH TWICE DAILY (Patient taking differently: Take 40 mg by mouth 2 (two) times daily.)   tadalafil (CIALIS) 5 MG tablet TAKE 1 TABLET (5 MG TOTAL) BY MOUTH DAILY.   [DISCONTINUED] diazepam (VALIUM) 5 MG tablet Take 1 tablet (5 mg total) by mouth 2 (two) times daily.   [DISCONTINUED] oxybutynin (DITROPAN-XL) 10 MG 24 hr tablet TAKE 1 TABLET BY MOUTH ONCE DAILY   [DISCONTINUED] tiZANidine (ZANAFLEX) 4 MG tablet Take 0.5 tablets (2 mg total) by mouth every 6 (six) hours as needed for muscle spasms.   No  facility-administered encounter medications on file as of 10/18/2020.    Follow-up: Return in about 10 weeks (around 12/27/2020).   Noreene Larsson, NP

## 2020-10-18 NOTE — Patient Instructions (Signed)
Please have fasting labs drawn 2-3 days prior to your appointment so we can discuss the results during your office visit.  

## 2020-10-18 NOTE — Assessment & Plan Note (Signed)
-  followed by Dr. Domenic Polite for this -06/06/20 CTA showed Fusiform infrarenal abdominal aortic aneurysm measuring up to 3.8 cm, increased from 3.5 cm in 2015.

## 2020-10-18 NOTE — Assessment & Plan Note (Signed)
-  check lipids with next set of labs 

## 2020-10-19 ENCOUNTER — Other Ambulatory Visit: Payer: Self-pay

## 2020-10-19 MED ORDER — PROPRANOLOL HCL 20 MG PO TABS: 20.0000 mg | ORAL_TABLET | Freq: Every day | ORAL | 4 refills | Status: DC

## 2020-10-19 MED ORDER — PREDNISONE 5 MG PO TABS: 5.0000 mg | ORAL_TABLET | Freq: Every day | ORAL | 3 refills | Status: DC

## 2020-10-26 ENCOUNTER — Encounter: Payer: Self-pay | Admitting: Urology

## 2020-10-26 ENCOUNTER — Ambulatory Visit: Payer: Medicare PPO | Admitting: Urology

## 2020-10-26 ENCOUNTER — Other Ambulatory Visit: Payer: Self-pay

## 2020-10-26 VITALS — BP 111/60 | HR 80

## 2020-10-26 DIAGNOSIS — N401 Enlarged prostate with lower urinary tract symptoms: Secondary | ICD-10-CM

## 2020-10-26 DIAGNOSIS — N138 Other obstructive and reflux uropathy: Secondary | ICD-10-CM

## 2020-10-26 DIAGNOSIS — R339 Retention of urine, unspecified: Secondary | ICD-10-CM | POA: Diagnosis not present

## 2020-10-26 MED ORDER — CLOTRIMAZOLE-BETAMETHASONE 1-0.05 % EX CREA: 1.0000 "application " | TOPICAL_CREAM | Freq: Two times a day (BID) | CUTANEOUS | 1 refills | Status: DC

## 2020-10-26 NOTE — Progress Notes (Signed)
Cath Change/ Replacement  Patient is present today for a catheter change due to urinary retention.  10ml of water was removed from the balloon, a 16FR foley cath was removed with out difficulty.  Patient was cleaned and prepped in a sterile fashion with betadine. A 16 FR foley cath was replaced into the bladder no complications were noted Urine return was noted 20ml and urine was yellow in color. The balloon was filled with 10ml of sterile water. A leg bag was attached for drainage.  A night bag was also given to the patient and patient was given instruction on how to change from one bag to another. Patient was given proper instruction on catheter care.    Performed by: Ercel Pepitone RN  Follow up: 1 month NV cath change  

## 2020-10-26 NOTE — Progress Notes (Signed)
10/26/2020 3:33 PM   Tristan Lopez [birth year] [id number]  Referring provider: Ailene Ards, NP [address] [place],  [place] [address]  Followup urinary retention   HPI: Tristan Lopez is a 87yo here for followup for BPH and urinary retention. He underwent UDS which showed a 17cm H2O unstable detrusor contraction but no voluntary detrusor contraction. He does not have the urge to urinate. No hematuria. No complaints with the foley in place.    PMH: Past Medical History:  Diagnosis Date   Abdominal aortic aneurysm without rupture (HCC)    Bilateral renal cysts    BPH (benign prostatic hyperplasia)    Carotid artery occlusion    Chronic atrial fibrillation (HCC)    COPD (chronic obstructive pulmonary disease) ([facility])    Diverticulosis    Pancolonic   Essential hypertension    Hemorrhoids    Hiatal hernia    Hx of adenomatous colonic polyps 01/17/2017   Hypercholesteremia    Liver cyst    Benign by liver biopsy September 2013   Nephrolithiasis    Nephrolithiasis    Pre-diabetes    Reflux esophagitis    Sleep apnea    Uses CPAP   Tubular adenoma     Surgical History: Past Surgical History:  Procedure Laterality Date   COLONOSCOPY     2003, no polyps per patient. Dr. West Carbo   COLONOSCOPY  11/01/11   Dr. Gala Romney- tubular adenoma,suboptimal preparation, internal hemorrhoids, o/w normal rectum. pancolonic diverticulosis   ESOPHAGOGASTRODUODENOSCOPY  11/01/11   Dr. Gala Romney- erosive reflux esophagitis along with a patulous EG junction, hialtal hernia, antral erosions with possible area of healing ulceration- s/p bx= chronic erosive gastritis, no malignancy   HERNIA REPAIR     x2, bilateral inguinal    KIDNEY STONE SURGERY     KNEE ARTHROSCOPY     X2   NASAL ENDOSCOPY      Home Medications:  Allergies as of 10/26/2020       Reactions   Latex Rash        Medication List        Accurate as of October 26, 2020  3:33 PM. If you have any questions, ask your  nurse or doctor.          amLODipine 10 MG tablet Commonly known as: NORVASC TAKE ONE TABLET BY MOUTH EVERY DAY   benazepril 40 MG tablet Commonly known as: LOTENSIN Take 1 tablet (40 mg total) by mouth daily.   DIGESTIVE ADVANTAGE PO Take by mouth 2 (two) times daily.   furosemide 20 MG tablet Commonly known as: LASIX Take 1 tablet (20 mg total) by mouth daily. Take 20 mg daily as needed for leg swelling   Garlic 3662 MG Caps Take 1,000 mg by mouth daily.   pantoprazole 40 MG tablet Commonly known as: PROTONIX TAKE 1 TABLET BY MOUTH TWICE DAILY   potassium chloride 10 MEQ tablet Commonly known as: KLOR-CON TAKE 1 TABLET BY MOUTH DAILY WHEN YOU TAKE FUROSEMIDE What changed: See the new instructions.   predniSONE 5 MG tablet Commonly known as: DELTASONE Take 1 tablet (5 mg total) by mouth daily with breakfast.   propranolol 20 MG tablet Commonly known as: INDERAL Take 1 tablet (20 mg total) by mouth daily.   rosuvastatin 10 MG tablet Commonly known as: CRESTOR Take 1 tablet (10 mg total) by mouth daily.   silodosin 8 MG Caps capsule Commonly known as: RAPAFLO Take 1 capsule (8 mg total) by mouth daily with  breakfast.   tadalafil 5 MG tablet Commonly known as: CIALIS TAKE 1 TABLET (5 MG TOTAL) BY MOUTH DAILY.   vitamin C 1000 MG tablet Take 1,000 mg by mouth daily.   Vitamin D-3 25 MCG (1000 UT) Caps Take 2,000 Units by mouth.   warfarin 5 MG tablet Commonly known as: COUMADIN Take as directed by the anticoagulation clinic. If you are unsure how to take this medication, talk to your nurse or doctor. Original instructions: TAKE ONE TABLET BY MOUTH EVERY DAY        Allergies:  Allergies  Allergen Reactions   Latex Rash    Family History: Family History  Problem Relation Age of Onset   Breast cancer Mother    Other Mother        Essential tremor   Stroke Mother    Heart disease Father        Pacemaker   Heart attack Maternal Grandfather     Colon cancer Neg Hx    Liver disease Neg Hx    Prostate cancer Neg Hx    Bladder Cancer Neg Hx    Kidney cancer Neg Hx     Social History:  reports that he quit smoking about 30 years ago. His smoking use included cigarettes. He smoked an average of .5 packs per day. He has never used smokeless tobacco. He reports that he does not drink alcohol and does not use drugs.  ROS: All other review of systems were reviewed and are negative except what is noted above in HPI  Physical Exam: BP 111/60   Pulse 80   Constitutional:  Alert and oriented, No acute distress. HEENT: [place] AT, moist mucus membranes.  Trachea midline, no masses. Cardiovascular: No clubbing, cyanosis, or edema. Respiratory: Normal respiratory effort, no increased work of breathing. GI: Abdomen is soft, nontender, nondistended, no abdominal masses GU: No CVA tenderness.  Lymph: No cervical or inguinal lymphadenopathy. Skin: No rashes, bruises or suspicious lesions. Neurologic: Grossly intact, no focal deficits, moving all 4 extremities. Psychiatric: Normal mood and affect.  Laboratory Data: Lab Results  Component Value Date   WBC 4.6 06/09/2020   HGB 11.7 (L) 06/09/2020   HCT 37.3 (L) 06/09/2020   MCV 95.9 06/09/2020   PLT 158 06/09/2020    Lab Results  Component Value Date   CREATININE 0.99 06/09/2020    No results found for: PSA  No results found for: TESTOSTERONE  No results found for: HGBA1C  Urinalysis    Component Value Date/Time   COLORURINE YELLOW 06/09/2020 0424   APPEARANCEUR Clear 09/21/2020 1431   LABSPEC 1.009 06/09/2020 0424   PHURINE 6.0 06/09/2020 0424   GLUCOSEU Negative 09/21/2020 1431   HGBUR NEGATIVE 06/09/2020 0424   BILIRUBINUR Negative 09/21/2020 1431   KETONESUR NEGATIVE 06/09/2020 0424   PROTEINUR Negative 09/21/2020 1431   PROTEINUR NEGATIVE 06/09/2020 0424   UROBILINOGEN 0.2 10/04/2011 2218   NITRITE Negative 09/21/2020 1431   NITRITE NEGATIVE 06/09/2020 0424    LEUKOCYTESUR Negative 09/21/2020 1431   LEUKOCYTESUR NEGATIVE 06/09/2020 0424    Lab Results  Component Value Date   LABMICR Comment 09/21/2020   WBCUA 0-5 08/12/2020   LABEPIT None seen 08/12/2020   MUCUS Present 08/12/2020   BACTERIA None seen 08/12/2020    Pertinent Imaging:  No results found for this or any previous visit.  No results found for this or any previous visit.  No results found for this or any previous visit.  No results found for this or any  previous visit.  No results found for this or any previous visit.  No results found for this or any previous visit.  No results found for this or any previous visit.  No results found for this or any previous visit.   Assessment & Plan:    1. Urinary retention We discussed the management of his BPH including continued medical therapy, Rezum, Urolift, TURP and simple prostatectomy. After discussing the options the patient has elected to proceed with chronic indwelling foley. We will change the foley today and we will schedule for monthly foley changes 2. Benign prostatic hyperplasia with urinary obstruction -continue indwelling foley    No follow-ups on file.  Nicolette Bang, MD  [facility] [place]

## 2020-10-26 NOTE — Progress Notes (Signed)
Urological Symptom Review  Patient is experiencing the following symptoms: none   Review of Systems  Gastrointestinal (upper)  : Negative for upper GI symptoms  Gastrointestinal (lower) : Negative for lower GI symptoms  Constitutional : Negative for symptoms  Skin: Negative for skin symptoms  Eyes: Negative for eye symptoms  Ear/Nose/Throat : Negative for Ear/Nose/Throat symptoms  Hematologic/Lymphatic: Negative for Hematologic/Lymphatic symptoms  Cardiovascular : Negative for cardiovascular symptoms  Respiratory : Negative for respiratory symptoms  Endocrine: Negative for endocrine symptoms  Musculoskeletal: Negative for musculoskeletal symptoms  Neurological: Negative for neurological symptoms  Psychologic: Negative for psychiatric symptoms none

## 2020-10-26 NOTE — Patient Instructions (Signed)
Benign Prostatic Hyperplasia Benign prostatic hyperplasia (BPH) is an enlarged prostate gland that is caused by the normal aging process and not by cancer. The prostate is a walnut-sized gland that is involved in the production of semen. It is located in front of the rectum and below the bladder. The bladder stores urine and the urethra is the tube that carries the urine out of the body. The prostate may get bigger as a man gets older. An enlarged prostate can press on the urethra. This can make it harder to pass urine. The build-up of urine in the bladder can cause infection. Back pressure and infection may progress to bladder damage and kidney (renal) failure. What are the causes? This condition is part of a normal aging process. However, not all men develop problems from this condition. If the prostate enlarges away from the urethra, urine flow will not be blocked. If it enlarges toward the urethra and compresses it, there will be problems passing urine. What increases the risk? This condition is more likely to develop in men over the age of 50 years. What are the signs or symptoms? Symptoms of this condition include: Getting up often during the night to urinate. Needing to urinate frequently during the day. Difficulty starting urine flow. Decrease in size and strength of your urine stream. Leaking (dribbling) after urinating. Inability to pass urine. This needs immediate treatment. Inability to completely empty your bladder. Pain when you pass urine. This is more common if there is also an infection. Urinary tract infection (UTI). How is this diagnosed? This condition is diagnosed based on your medical history, a physical exam, and your symptoms. Tests will also be done, such as: A post-void bladder scan. This measures any amount of urine that may remain in your bladder after you finish urinating. A digital rectal exam. In a rectal exam, your health care provider checks your prostate by  putting a lubricated, gloved finger into your rectum to feel the back of your prostate gland. This exam detects the size of your gland and any abnormal lumps or growths. An exam of your urine (urinalysis). A prostate specific antigen (PSA) screening. This is a blood test used to screen for prostate cancer. An ultrasound. This test uses sound waves to electronically produce a picture of your prostate gland. Your health care provider may refer you to a specialist in kidney and prostate diseases (urologist). How is this treated? Once symptoms begin, your health care provider will monitor your condition (active surveillance or watchful waiting). Treatment for this condition will depend on the severity of your condition. Treatment may include: Observation and yearly exams. This may be the only treatment needed if your condition and symptoms are mild. Medicines to relieve your symptoms, including: Medicines to shrink the prostate. Medicines to relax the muscle of the prostate. Surgery in severe cases. Surgery may include: Prostatectomy. In this procedure, the prostate tissue is removed completely through an open incision or with a laparoscope or robotics. Transurethral resection of the prostate (TURP). In this procedure, a tool is inserted through the opening at the tip of the penis (urethra). It is used to cut away tissue of the inner core of the prostate. The pieces are removed through the same opening of the penis. This removes the blockage. Transurethral incision (TUIP). In this procedure, small cuts are made in the prostate. This lessens the prostate's pressure on the urethra. Transurethral microwave thermotherapy (TUMT). This procedure uses microwaves to create heat. The heat destroys and removes a   small amount of prostate tissue. Transurethral needle ablation (TUNA). This procedure uses radio frequencies to destroy and remove a small amount of prostate tissue. Interstitial laser coagulation (ILC).  This procedure uses a laser to destroy and remove a small amount of prostate tissue. Transurethral electrovaporization (TUVP). This procedure uses electrodes to destroy and remove a small amount of prostate tissue. Prostatic urethral lift. This procedure inserts an implant to push the lobes of the prostate away from the urethra. Follow these instructions at home: Take over-the-counter and prescription medicines only as told by your health care provider. Monitor your symptoms for any changes. Contact your health care provider with any changes. Avoid drinking large amounts of liquid before going to bed or out in public. Avoid or reduce how much caffeine or alcohol you drink. Give yourself time when you urinate. Keep all follow-up visits as told by your health care provider. This is important. Contact a health care provider if: You have unexplained back pain. Your symptoms do not get better with treatment. You develop side effects from the medicine you are taking. Your urine becomes very dark or has a bad smell. Your lower abdomen becomes distended and you have trouble passing your urine. Get help right away if: You have a fever or chills. You suddenly cannot urinate. You feel lightheaded, or very dizzy, or you faint. There are large amounts of blood or clots in the urine. Your urinary problems become hard to manage. You develop moderate to severe low back or flank pain. The flank is the side of your body between the ribs and the hip. These symptoms may represent a serious problem that is an emergency. Do not wait to see if the symptoms will go away. Get medical help right away. Call your local emergency services (911 in the [place] Do not drive yourself to the hospital. Summary Benign prostatic hyperplasia (BPH) is an enlarged prostate that is caused by the normal aging process and not by cancer. An enlarged prostate can press on the urethra. This can make it hard to pass urine. This  condition is part of a normal aging process and is more likely to develop in men over the age of 50 years. Get help right away if you suddenly cannot urinate. This information is not intended to replace advice given to you by your health care provider. Make sure you discuss any questions you have with your health care provider. Document Revised: 04/20/2020 Document Reviewed: 09/17/2019 Elsevier Patient Education  2022 [employer]  

## 2020-10-27 ENCOUNTER — Ambulatory Visit (INDEPENDENT_AMBULATORY_CARE_PROVIDER_SITE_OTHER): Payer: Medicare PPO | Admitting: *Deleted

## 2020-10-27 DIAGNOSIS — I4821 Permanent atrial fibrillation: Secondary | ICD-10-CM

## 2020-10-27 DIAGNOSIS — Z5181 Encounter for therapeutic drug level monitoring: Secondary | ICD-10-CM

## 2020-10-27 LAB — PROTIME-INR
INR: 3.2 — ABNORMAL HIGH (ref 0.9–1.2)
Prothrombin Time: 31.7 s — ABNORMAL HIGH (ref 9.1–12.0)

## 2020-10-27 NOTE — Patient Instructions (Signed)
Pt is in independent living facility in [place].  Get labs done at [facility] on [address].  Son Tristan Lopez manages pts medicine.  Lab orders are mailed to [facility] address. Decrease warfarin to 5 mg daily except 2.5mg  on Sundays, Tuesdays and Thursdays Recheck in 4 weeks. LMOM for son Tristan Lopez and sent him an email. Instructions Emailed to [email or web address] Orders mailed to home address

## 2020-11-08 ENCOUNTER — Other Ambulatory Visit: Payer: Self-pay | Admitting: *Deleted

## 2020-11-08 ENCOUNTER — Ambulatory Visit (INDEPENDENT_AMBULATORY_CARE_PROVIDER_SITE_OTHER): Payer: Medicare PPO | Admitting: Internal Medicine

## 2020-11-08 DIAGNOSIS — I10 Essential (primary) hypertension: Secondary | ICD-10-CM

## 2020-11-08 MED ORDER — POTASSIUM CHLORIDE ER 10 MEQ PO TBCR
10.0000 meq | EXTENDED_RELEASE_TABLET | Freq: Every day | ORAL | 0 refills | Status: DC
Start: 2020-11-08 — End: 2021-02-07

## 2020-11-08 MED ORDER — FUROSEMIDE 20 MG PO TABS: 20.0000 mg | ORAL_TABLET | Freq: Every day | ORAL | 1 refills | Status: DC

## 2020-11-08 MED ORDER — PANTOPRAZOLE SODIUM 40 MG PO TBEC: 40.0000 mg | DELAYED_RELEASE_TABLET | Freq: Two times a day (BID) | ORAL | 6 refills | Status: DC

## 2020-11-27 ENCOUNTER — Encounter (HOSPITAL_COMMUNITY): Payer: Self-pay | Admitting: *Deleted

## 2020-11-27 ENCOUNTER — Observation Stay (HOSPITAL_COMMUNITY): Payer: Medicare PPO

## 2020-11-27 ENCOUNTER — Inpatient Hospital Stay (HOSPITAL_COMMUNITY)
Admission: EM | Admit: 2020-11-27 | Discharge: 2020-12-01 | DRG: 378 | Disposition: A | Discharge status: Home / Self Care | Payer: Medicare PPO | Attending: Internal Medicine | Admitting: Internal Medicine

## 2020-11-27 ENCOUNTER — Other Ambulatory Visit: Payer: Self-pay

## 2020-11-27 DIAGNOSIS — E7800 Pure hypercholesterolemia, unspecified: Secondary | ICD-10-CM | POA: Diagnosis present

## 2020-11-27 DIAGNOSIS — Z20822 Contact with and (suspected) exposure to covid-19: Secondary | ICD-10-CM | POA: Diagnosis present

## 2020-11-27 DIAGNOSIS — R0600 Dyspnea, unspecified: Secondary | ICD-10-CM

## 2020-11-27 DIAGNOSIS — Z87891 Personal history of nicotine dependence: Secondary | ICD-10-CM

## 2020-11-27 DIAGNOSIS — T45515A Adverse effect of anticoagulants, initial encounter: Secondary | ICD-10-CM | POA: Diagnosis present

## 2020-11-27 DIAGNOSIS — Z66 Do not resuscitate: Secondary | ICD-10-CM | POA: Diagnosis present

## 2020-11-27 DIAGNOSIS — K5731 Diverticulosis of large intestine without perforation or abscess with bleeding: Secondary | ICD-10-CM | POA: Diagnosis not present

## 2020-11-27 DIAGNOSIS — I4891 Unspecified atrial fibrillation: Secondary | ICD-10-CM | POA: Diagnosis present

## 2020-11-27 DIAGNOSIS — D62 Acute posthemorrhagic anemia: Secondary | ICD-10-CM | POA: Diagnosis present

## 2020-11-27 DIAGNOSIS — K625 Hemorrhage of anus and rectum: Secondary | ICD-10-CM | POA: Diagnosis not present

## 2020-11-27 DIAGNOSIS — Z823 Family history of stroke: Secondary | ICD-10-CM

## 2020-11-27 DIAGNOSIS — K219 Gastro-esophageal reflux disease without esophagitis: Secondary | ICD-10-CM | POA: Diagnosis present

## 2020-11-27 DIAGNOSIS — K449 Diaphragmatic hernia without obstruction or gangrene: Secondary | ICD-10-CM | POA: Diagnosis present

## 2020-11-27 DIAGNOSIS — I482 Chronic atrial fibrillation, unspecified: Secondary | ICD-10-CM | POA: Diagnosis present

## 2020-11-27 DIAGNOSIS — Z9989 Dependence on other enabling machines and devices: Secondary | ICD-10-CM

## 2020-11-27 DIAGNOSIS — I10 Essential (primary) hypertension: Secondary | ICD-10-CM | POA: Diagnosis present

## 2020-11-27 DIAGNOSIS — D6832 Hemorrhagic disorder due to extrinsic circulating anticoagulants: Secondary | ICD-10-CM | POA: Diagnosis present

## 2020-11-27 DIAGNOSIS — G4733 Obstructive sleep apnea (adult) (pediatric): Secondary | ICD-10-CM | POA: Diagnosis present

## 2020-11-27 DIAGNOSIS — I700 Atherosclerosis of aorta: Secondary | ICD-10-CM | POA: Diagnosis present

## 2020-11-27 DIAGNOSIS — E66811 Obesity, class 1: Secondary | ICD-10-CM

## 2020-11-27 DIAGNOSIS — K635 Polyp of colon: Secondary | ICD-10-CM | POA: Diagnosis present

## 2020-11-27 DIAGNOSIS — E6609 Other obesity due to excess calories: Secondary | ICD-10-CM | POA: Diagnosis present

## 2020-11-27 DIAGNOSIS — J449 Chronic obstructive pulmonary disease, unspecified: Secondary | ICD-10-CM | POA: Diagnosis present

## 2020-11-27 DIAGNOSIS — K922 Gastrointestinal hemorrhage, unspecified: Secondary | ICD-10-CM

## 2020-11-27 DIAGNOSIS — I2510 Atherosclerotic heart disease of native coronary artery without angina pectoris: Secondary | ICD-10-CM | POA: Diagnosis present

## 2020-11-27 DIAGNOSIS — R7303 Prediabetes: Secondary | ICD-10-CM | POA: Diagnosis present

## 2020-11-27 DIAGNOSIS — K2960 Other gastritis without bleeding: Secondary | ICD-10-CM | POA: Diagnosis present

## 2020-11-27 DIAGNOSIS — K317 Polyp of stomach and duodenum: Secondary | ICD-10-CM | POA: Diagnosis present

## 2020-11-27 DIAGNOSIS — Z7901 Long term (current) use of anticoagulants: Secondary | ICD-10-CM

## 2020-11-27 DIAGNOSIS — Z79899 Other long term (current) drug therapy: Secondary | ICD-10-CM

## 2020-11-27 DIAGNOSIS — Z6833 Body mass index (BMI) 33.0-33.9, adult: Secondary | ICD-10-CM

## 2020-11-27 DIAGNOSIS — I714 Abdominal aortic aneurysm, without rupture, unspecified: Secondary | ICD-10-CM | POA: Diagnosis present

## 2020-11-27 DIAGNOSIS — K2100 Gastro-esophageal reflux disease with esophagitis, without bleeding: Secondary | ICD-10-CM | POA: Diagnosis present

## 2020-11-27 DIAGNOSIS — Z8249 Family history of ischemic heart disease and other diseases of the circulatory system: Secondary | ICD-10-CM

## 2020-11-27 DIAGNOSIS — N401 Enlarged prostate with lower urinary tract symptoms: Secondary | ICD-10-CM | POA: Diagnosis present

## 2020-11-27 LAB — COMPREHENSIVE METABOLIC PANEL
ALT: 12 U/L (ref 0–44)
AST: 18 U/L (ref 15–41)
Albumin: 3.2 g/dL — ABNORMAL LOW (ref 3.5–5.0)
Alkaline Phosphatase: 39 U/L (ref 38–126)
Anion gap: 6 (ref 5–15)
BUN: 29 mg/dL — ABNORMAL HIGH (ref 8–23)
CO2: 28 mmol/L (ref 22–32)
Calcium: 8.5 mg/dL — ABNORMAL LOW (ref 8.9–10.3)
Chloride: 104 mmol/L (ref 98–111)
Creatinine, Ser: 0.86 mg/dL (ref 0.61–1.24)
GFR, Estimated: >60 mL/min (ref >60)
Glucose, Bld: 99 mg/dL (ref 70–99)
Potassium: 3.7 mmol/L (ref 3.5–5.1)
Sodium: 138 mmol/L (ref 135–145)
Total Bilirubin: 0.6 mg/dL (ref 0.3–1.2)
Total Protein: 5.6 g/dL — ABNORMAL LOW (ref 6.5–8.1)

## 2020-11-27 LAB — RESP PANEL BY RT-PCR (FLU A&B, COVID) ARPGX2
Influenza A by PCR: NEGATIVE
Influenza B by PCR: NEGATIVE
SARS Coronavirus 2 by RT PCR: NEGATIVE

## 2020-11-27 LAB — CBC WITH DIFFERENTIAL/PLATELET
Abs Immature Granulocytes: 0.01 10*3/uL (ref 0.00–0.07)
Basophils Absolute: 0 10*3/uL (ref 0.0–0.1)
Basophils Relative: 0 %
Eosinophils Absolute: 0.1 10*3/uL (ref 0.0–0.5)
Eosinophils Relative: 3 %
HCT: 26.7 % — ABNORMAL LOW (ref 39.0–52.0)
Hemoglobin: 8.5 g/dL — ABNORMAL LOW (ref 13.0–17.0)
Immature Granulocytes: 0 %
Lymphocytes Relative: 34 %
Lymphs Abs: 1.1 10*3/uL (ref 0.7–4.0)
MCH: 32 pg (ref 26.0–34.0)
MCHC: 31.8 g/dL (ref 30.0–36.0)
MCV: 100.4 fL — ABNORMAL HIGH (ref 80.0–100.0)
Monocytes Absolute: 0.4 10*3/uL (ref 0.1–1.0)
Monocytes Relative: 12 %
Neutro Abs: 1.6 10*3/uL — ABNORMAL LOW (ref 1.7–7.7)
Neutrophils Relative %: 51 %
Platelets: 155 10*3/uL (ref 150–400)
RBC: 2.66 MIL/uL — ABNORMAL LOW (ref 4.22–5.81)
RDW: 14.2 % (ref 11.5–15.5)
WBC: 3.2 10*3/uL — ABNORMAL LOW (ref 4.0–10.5)
nRBC: 0 % (ref 0.0–0.2)

## 2020-11-27 LAB — POCT I-STAT, CHEM 8
BUN: 28 mg/dL — ABNORMAL HIGH (ref 8–23)
Calcium, Ion: 1.19 mmol/L (ref 1.15–1.40)
Chloride: 102 mmol/L (ref 98–111)
Creatinine, Ser: 1 mg/dL (ref 0.61–1.24)
Glucose, Bld: 86 mg/dL (ref 70–99)
HCT: 24 % — ABNORMAL LOW (ref 39.0–52.0)
Hemoglobin: 8.2 g/dL — ABNORMAL LOW (ref 13.0–17.0)
Potassium: 3.6 mmol/L (ref 3.5–5.1)
Sodium: 140 mmol/L (ref 135–145)
TCO2: 28 mmol/L (ref 22–32)

## 2020-11-27 LAB — CBC
HCT: 23.3 % — ABNORMAL LOW (ref 39.0–52.0)
Hemoglobin: 7.6 g/dL — ABNORMAL LOW (ref 13.0–17.0)
MCH: 31.7 pg (ref 26.0–34.0)
MCHC: 32.6 g/dL (ref 30.0–36.0)
MCV: 97.1 fL (ref 80.0–100.0)
Platelets: 129 10*3/uL — ABNORMAL LOW (ref 150–400)
RBC: 2.4 MIL/uL — ABNORMAL LOW (ref 4.22–5.81)
RDW: 16.4 % — ABNORMAL HIGH (ref 11.5–15.5)
WBC: 3.9 10*3/uL — ABNORMAL LOW (ref 4.0–10.5)
nRBC: 0 % (ref 0.0–0.2)

## 2020-11-27 LAB — ABO/RH: ABO/RH(D): AB POS

## 2020-11-27 LAB — HEMOGLOBIN AND HEMATOCRIT, BLOOD
HCT: 21.1 % — ABNORMAL LOW (ref 39.0–52.0)
Hemoglobin: 6.9 g/dL — CL (ref 13.0–17.0)

## 2020-11-27 LAB — I-STAT CHEM 8, ED
BUN: 27 mg/dL — ABNORMAL HIGH (ref 8–23)
Calcium, Ion: 1.19 mmol/L (ref 1.15–1.40)
Chloride: 101 mmol/L (ref 98–111)
Creatinine, Ser: 0.9 mg/dL (ref 0.61–1.24)
Glucose, Bld: 95 mg/dL (ref 70–99)
HCT: 25 % — ABNORMAL LOW (ref 39.0–52.0)
Hemoglobin: 8.5 g/dL — ABNORMAL LOW (ref 13.0–17.0)
Potassium: 3.6 mmol/L (ref 3.5–5.1)
Sodium: 139 mmol/L (ref 135–145)
TCO2: 27 mmol/L (ref 22–32)

## 2020-11-27 LAB — PROTIME-INR
INR: 2.7 — ABNORMAL HIGH (ref 0.8–1.2)
INR: 1.8 — ABNORMAL HIGH (ref 0.8–1.2)
Prothrombin Time: 28.5 seconds — ABNORMAL HIGH (ref 11.4–15.2)
Prothrombin Time: 20.6 seconds — ABNORMAL HIGH (ref 11.4–15.2)

## 2020-11-27 LAB — PREPARE RBC (CROSSMATCH)

## 2020-11-27 LAB — POC OCCULT BLOOD, ED: Fecal Occult Bld: POSITIVE — AB

## 2020-11-27 MED ORDER — SODIUM CHLORIDE 0.9% FLUSH
3.0000 mL | Freq: Two times a day (BID) | INTRAVENOUS | Status: DC
  Administered 2020-11-27 – 2020-12-01 (×5): 3 mL via INTRAVENOUS

## 2020-11-27 MED ORDER — LABETALOL HCL 5 MG/ML IV SOLN: 10.0000 mg | INTRAVENOUS | Status: DC | PRN

## 2020-11-27 MED ORDER — SODIUM CHLORIDE 0.9 % IV SOLN: 250.0000 mL | INTRAVENOUS | Status: DC | PRN

## 2020-11-27 MED ORDER — SODIUM CHLORIDE 0.9 % IV BOLUS
250.0000 mL | Freq: Once | INTRAVENOUS | Status: AC
  Administered 2020-11-27: 250 mL via INTRAVENOUS

## 2020-11-27 MED ORDER — SODIUM CHLORIDE 0.9 % IV BOLUS
500.0000 mL | Freq: Once | INTRAVENOUS | Status: AC
  Administered 2020-11-27: 500 mL via INTRAVENOUS

## 2020-11-27 MED ORDER — ACETAMINOPHEN 325 MG PO TABS: 650.0000 mg | ORAL_TABLET | Freq: Four times a day (QID) | ORAL | Status: DC | PRN

## 2020-11-27 MED ORDER — POLYETHYLENE GLYCOL 3350 17 G PO PACK: 17.0000 g | PACK | Freq: Every day | ORAL | Status: DC | PRN

## 2020-11-27 MED ORDER — SODIUM CHLORIDE 0.9% IV SOLUTION: Freq: Once | INTRAVENOUS | Status: AC

## 2020-11-27 MED ORDER — BISACODYL 10 MG RE SUPP: 10.0000 mg | Freq: Every day | RECTAL | Status: DC | PRN

## 2020-11-27 MED ORDER — PANTOPRAZOLE SODIUM 40 MG IV SOLR
40.0000 mg | Freq: Once | INTRAVENOUS | Status: AC
  Administered 2020-11-27: 40 mg via INTRAVENOUS
  Filled 2020-11-27: qty 40

## 2020-11-27 MED ORDER — ALBUTEROL SULFATE (2.5 MG/3ML) 0.083% IN NEBU: 2.5000 mg | INHALATION_SOLUTION | RESPIRATORY_TRACT | Status: DC | PRN

## 2020-11-27 MED ORDER — PANTOPRAZOLE SODIUM 40 MG IV SOLR
40.0000 mg | Freq: Two times a day (BID) | INTRAVENOUS | Status: DC
  Administered 2020-11-27 – 2020-12-01 (×8): 40 mg via INTRAVENOUS
  Filled 2020-11-27 (×8): qty 40

## 2020-11-27 MED ORDER — FUROSEMIDE 10 MG/ML IJ SOLN
20.0000 mg | Freq: Once | INTRAMUSCULAR | Status: AC
  Administered 2020-11-27: 20 mg via INTRAVENOUS
  Filled 2020-11-27: qty 2

## 2020-11-27 MED ORDER — ONDANSETRON HCL 4 MG PO TABS: 4.0000 mg | ORAL_TABLET | Freq: Four times a day (QID) | ORAL | Status: DC | PRN

## 2020-11-27 MED ORDER — PROPRANOLOL HCL 20 MG PO TABS
20.0000 mg | ORAL_TABLET | Freq: Every day | ORAL | Status: DC
  Administered 2020-11-28 – 2020-12-01 (×4): 20 mg via ORAL
  Filled 2020-11-27 (×5): qty 1

## 2020-11-27 MED ORDER — ROSUVASTATIN CALCIUM 10 MG PO TABS
10.0000 mg | ORAL_TABLET | Freq: Every day | ORAL | Status: DC
  Administered 2020-11-28 – 2020-12-01 (×4): 10 mg via ORAL
  Filled 2020-11-27 (×5): qty 1

## 2020-11-27 MED ORDER — ACETAMINOPHEN 650 MG RE SUPP: 650.0000 mg | Freq: Four times a day (QID) | RECTAL | Status: DC | PRN

## 2020-11-27 MED ORDER — SODIUM CHLORIDE 0.9% FLUSH
3.0000 mL | Freq: Two times a day (BID) | INTRAVENOUS | Status: DC
  Administered 2020-11-27 – 2020-12-01 (×8): 3 mL via INTRAVENOUS

## 2020-11-27 MED ORDER — ADULT MULTIVITAMIN W/MINERALS CH
1.0000 | ORAL_TABLET | Freq: Every day | ORAL | Status: DC
  Administered 2020-11-28 – 2020-12-01 (×4): 1 via ORAL
  Filled 2020-11-27 (×5): qty 1

## 2020-11-27 MED ORDER — IOHEXOL 350 MG/ML SOLN
100.0000 mL | Freq: Once | INTRAVENOUS | Status: AC | PRN
  Administered 2020-11-27: 100 mL via INTRAVENOUS

## 2020-11-27 MED ORDER — ONDANSETRON HCL 4 MG/2ML IJ SOLN: 4.0000 mg | Freq: Four times a day (QID) | INTRAMUSCULAR | Status: DC | PRN

## 2020-11-27 MED ORDER — SODIUM CHLORIDE 0.9% FLUSH: 3.0000 mL | INTRAVENOUS | Status: DC | PRN

## 2020-11-27 MED ORDER — TRAZODONE HCL 50 MG PO TABS: 50.0000 mg | ORAL_TABLET | Freq: Every evening | ORAL | Status: DC | PRN

## 2020-11-27 NOTE — ED Triage Notes (Addendum)
Pt c/o bright red bleeding and weakness for a couple of days. Pt is on Coumadin for A. Fib. Pt also c/o cough for several days.

## 2020-11-27 NOTE — Progress Notes (Addendum)
Notified [facility] of hgb of 6.9. awaiting orders.  Hgb stick ordered and now 7. [facility] notified and no new orders. Cont to monitor

## 2020-11-27 NOTE — ED Provider Notes (Signed)
[facility] EMERGENCY DEPARTMENT Provider Note   CSN: [id number] Arrival date & time: 11/27/20  8527     History Chief Complaint  Patient presents with   Rectal Bleeding    Tristan Lopez is a 87 y.o. male.  Patient complains of rectal bleeding.  The blood has been red and dark red.  He stated that it started yesterday and he has gone every time that he is to use the bathroom.Marland Kitchen  He is got at least 6 or 7 times.  Patient is taking Coumadin for atrial failed  The history is provided by the patient and medical records. No language interpreter was used.  Rectal Bleeding Quality:  [place] Amount:  Moderate Duration:  2 days Timing:  Constant Chronicity:  New Context: not anal fissures   Similar prior episodes: no   Relieved by:  Nothing Worsened by:  Nothing Ineffective treatments:  None tried Associated symptoms: no abdominal pain   Risk factors: anticoagulant use       Past Medical History:  Diagnosis Date   Abdominal aortic aneurysm without rupture    Bilateral renal cysts    BPH (benign prostatic hyperplasia)    Carotid artery occlusion    Chronic atrial fibrillation (HCC)    COPD (chronic obstructive pulmonary disease) ([place])    Diverticulosis    Pancolonic   Essential hypertension    Hemorrhoids    Hiatal hernia    Hx of adenomatous colonic polyps 01/17/2017   Hypercholesteremia    Liver cyst    Benign by liver biopsy September 2013   Nephrolithiasis    Nephrolithiasis    Pre-diabetes    Reflux esophagitis    Sleep apnea    Uses CPAP   Tubular adenoma     Patient Active Problem List   Diagnosis Date Noted   Acute GI bleeding 11/27/2020   COPD (chronic obstructive pulmonary disease) ([place]) 04/21/2019   Benign prostatic hyperplasia 03/21/2018   Chronic kidney disease, stage 2 (mild) 03/21/2018   Hypokalemia 03/21/2018   Obstructive sleep apnea 03/21/2018   Hoarseness 01/17/2017   Hyperlipidemia 11/19/2013   AAA (abdominal aortic aneurysm) without  rupture 02/17/2013   Encounter for therapeutic drug monitoring 02/12/2013   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/2012   Essential hypertension 05/12/2012   Long term (current) use of anticoagulants 03/27/2012   Atrial fibrillation ([place]) 03/13/2012   GERD (gastroesophageal reflux disease) 10/08/2011   Constipation 10/08/2011   High risk medication use 10/08/2011    Past Surgical History:  Procedure Laterality Date   COLONOSCOPY     2003, no polyps per patient. Dr. West Carbo   COLONOSCOPY  11/01/11   Dr. Gala Romney- tubular adenoma,suboptimal preparation, internal hemorrhoids, o/w normal rectum. pancolonic diverticulosis   ESOPHAGOGASTRODUODENOSCOPY  11/01/11   Dr. Gala Romney- erosive reflux esophagitis along with a patulous EG junction, hialtal hernia, antral erosions with possible area of healing ulceration- s/p bx= chronic erosive gastritis, no malignancy   HERNIA REPAIR     x2, bilateral inguinal    KIDNEY STONE SURGERY     KNEE ARTHROSCOPY     X2   NASAL ENDOSCOPY         Family History  Problem Relation Age of Onset   Breast cancer Mother    Other Mother        Essential tremor   Stroke Mother    Heart disease Father        Pacemaker   Heart attack Maternal Grandfather  Colon cancer Neg Hx    Liver disease Neg Hx    Prostate cancer Neg Hx    Bladder Cancer Neg Hx    Kidney cancer Neg Hx     Social History   Tobacco Use   Smoking status: Former    Packs/day: 0.50    Types: Cigarettes    Quit date: 1992    Years since quitting: 30.8   Smokeless tobacco: Never  Vaping Use   Vaping Use: Never used  Substance Use Topics   Alcohol use: No   Drug use: No    Home Medications Prior to Admission medications   Medication Sig Start Date End Date Taking? Authorizing Provider  amLODipine (NORVASC) 10 MG tablet TAKE ONE TABLET BY MOUTH EVERY DAY 07/14/20 08/13/20  Noreene Larsson, NP  Ascorbic Acid (VITAMIN C) 1000 MG tablet Take 1,000  mg by mouth daily.    [provider]  benazepril (LOTENSIN) 40 MG tablet Take 1 tablet (40 mg total) by mouth daily. 08/24/20   Ailene Ards, NP  Cholecalciferol (VITAMIN D-3) 1000 units CAPS Take 2,000 Units by mouth.    [provider]  clotrimazole-betamethasone (LOTRISONE) cream Apply 1 application topically 2 (two) times daily. 10/26/20   McKenzie, Candee Furbish, MD  furosemide (LASIX) 20 MG tablet Take 1 tablet (20 mg total) by mouth daily. Take 20 mg daily as needed for leg swelling 11/08/20   Noreene Larsson, NP  Garlic 9326 MG CAPS Take 1,000 mg by mouth daily.    [provider]  pantoprazole (PROTONIX) 40 MG tablet Take 1 tablet (40 mg total) by mouth 2 (two) times daily. 11/08/20 12/08/20  Noreene Larsson, NP  potassium chloride (KLOR-CON) 10 MEQ tablet Take 1 tablet (10 mEq total) by mouth daily. 11/08/20   Noreene Larsson, NP  predniSONE (DELTASONE) 5 MG tablet Take 1 tablet (5 mg total) by mouth daily with breakfast. 10/19/20   Noreene Larsson, NP  Probiotic Product (DIGESTIVE ADVANTAGE PO) Take by mouth 2 (two) times daily.     [provider]  propranolol (INDERAL) 20 MG tablet Take 1 tablet (20 mg total) by mouth daily. 10/19/20   Noreene Larsson, NP  rosuvastatin (CRESTOR) 10 MG tablet Take 1 tablet (10 mg total) by mouth daily. 08/23/20   Noreene Larsson, NP  silodosin (RAPAFLO) 8 MG CAPS capsule Take 1 capsule (8 mg total) by mouth daily with breakfast. 08/12/20   McKenzie, Candee Furbish, MD  tadalafil (CIALIS) 5 MG tablet TAKE 1 TABLET (5 MG TOTAL) BY MOUTH DAILY. 06/06/20 07/06/20  Noreene Larsson, NP  warfarin (COUMADIN) 5 MG tablet TAKE ONE TABLET BY MOUTH EVERY DAY 08/23/20   Noreene Larsson, NP    Allergies    Latex  Review of Systems   Review of Systems  Constitutional:  Negative for appetite change and fatigue.  HENT:  Negative for congestion, ear discharge and sinus pressure.   Eyes:  Negative for discharge.  Respiratory:  Negative for cough.    Cardiovascular:  Negative for chest pain.  Gastrointestinal:  Positive for hematochezia. Negative for abdominal pain and diarrhea.       Rectal bleeding  Genitourinary:  Negative for frequency and hematuria.  Musculoskeletal:  Negative for back pain.  Skin:  Negative for rash.  Neurological:  Negative for seizures and headaches.  Psychiatric/Behavioral:  Negative for hallucinations.    Physical Exam Updated Vital Signs BP 131/69   Pulse 80   Temp  97.7 F (36.5 C) (Oral)   Resp 20   Ht 5\' 9"  (1.753 m)   Wt 104.3 kg   SpO2 98%   BMI 33.97 kg/m   Physical Exam Vitals and nursing note reviewed.  Constitutional:      Appearance: He is well-developed.  HENT:     Head: Normocephalic.     Nose: Nose normal.  Eyes:     General: No scleral icterus.    Conjunctiva/sclera: Conjunctivae normal.  Neck:     Thyroid: No thyromegaly.  Cardiovascular:     Rate and Rhythm: Normal rate and regular rhythm.     Heart sounds: No murmur heard.   No friction rub. No gallop.  Pulmonary:     Breath sounds: No stridor. No wheezing or rales.  Chest:     Chest wall: No tenderness.  Abdominal:     General: There is no distension.     Tenderness: There is no abdominal tenderness. There is no rebound.  Genitourinary:    Comments: Rectal exam shows dark red blood that is heme positive Musculoskeletal:        General: Normal range of motion.     Cervical back: Neck supple.  Lymphadenopathy:     Cervical: No cervical adenopathy.  Skin:    Findings: No erythema or rash.  Neurological:     Mental Status: He is alert and oriented to person, place, and time.     Motor: No abnormal muscle tone.     Coordination: Coordination normal.  Psychiatric:        Behavior: Behavior normal.    ED Results / Procedures / Treatments   Labs (all labs ordered are listed, but only abnormal results are displayed) Labs Reviewed  CBC WITH DIFFERENTIAL/PLATELET - Abnormal; Notable for the following components:       Result Value   WBC 3.2 (*)    RBC 2.66 (*)    Hemoglobin 8.5 (*)    HCT 26.7 (*)    MCV 100.4 (*)    Neutro Abs 1.6 (*)    All other components within normal limits  COMPREHENSIVE METABOLIC PANEL - Abnormal; Notable for the following components:   BUN 29 (*)    Calcium 8.5 (*)    Total Protein 5.6 (*)    Albumin 3.2 (*)    All other components within normal limits  PROTIME-INR - Abnormal; Notable for the following components:   Prothrombin Time 28.5 (*)    INR 2.7 (*)    All other components within normal limits  I-STAT CHEM 8, ED - Abnormal; Notable for the following components:   BUN 27 (*)    Hemoglobin 8.5 (*)    HCT 25.0 (*)    All other components within normal limits  POC OCCULT BLOOD, ED - Abnormal; Notable for the following components:   Fecal Occult Bld POSITIVE (*)    All other components within normal limits  OCCULT BLOOD X 1 CARD TO LAB, STOOL  TYPE AND SCREEN    EKG None  Radiology No results found.  Procedures Procedures   Medications Ordered in ED Medications  pantoprazole (PROTONIX) injection 40 mg (has no administration in time range)  pantoprazole (PROTONIX) injection 40 mg (has no administration in time range)  sodium chloride 0.9 % bolus 250 mL (0 mLs Intravenous Stopped 11/27/20 0945)    ED Course  I have reviewed the triage vital signs and the nursing notes.  Pertinent labs & imaging results that were available during my  care of the patient were reviewed by me and considered in my medical decision making (see chart for details). I spoke with Dr. Abbey Chatters GI.  And he recommended admission to medicine and he may need colonoscopy tomorrow   MDM Rules/Calculators/A&P                           Patient with rectal bleeding and significant decrease in his hemoglobin from about 12.5  to  8.5.  Patient is medically stable and will be admitted to medicine with GI consult Final Clinical Impression(s) / ED Diagnoses Final diagnoses:  Rectal  bleeding    Rx / DC Orders ED Discharge Orders     None        Milton Ferguson, MD 11/27/20 1126

## 2020-11-27 NOTE — Clinical Social Work Note (Signed)
CSW notified of patient's daughter's inquiry about coverage of medications. CSW notified daughter that coverage of medications may depend on inpatient status. CSW suggested patient's daughter follow up with patient's insurance company to review coverage.

## 2020-11-27 NOTE — H&P (Addendum)
Patient Demographics:    Tristan Lopez, is a 87 y.o. male  MRN: [id number]   DOB - 1930-12-13  Admit Date - 11/27/2020  Outpatient Primary MD for the patient is Noreene Larsson, NP   Assessment & Plan:    Principal Problem:   Acute GI bleeding Active Problems:   Atrial fibrillation ([place])   Long term (current) use of anticoagulants   GERD (gastroesophageal reflux disease)   Essential hypertension   AAA (abdominal aortic aneurysm) without rupture   COPD (chronic obstructive pulmonary disease) (HCC)   1)Acute GI bleed in the setting of chronic anticoagulation with INR of 2.7 --Review of records shows that Patient had EGD and colonoscopy in October 2013--- at that time colonoscopy showed pancolonic diverticulosis and internal hemorrhoids, EGD showed reflux esophagitis, antral/gastric ulcers and hiatal hernia -Cannot rule out diverticular bleed - Hgb is down to 8.5 from a baseline usually around 12, -FFP x2 units, transfuse 1 units of PRBC -Serial H&H and PT/INR -GI consult requested for possible endoluminal evaluation -We will get CTA if bleeding persist -Protonix as ordered -No abdominal pain, -No hematuria  2) chronic atrial fibrillation---- -Continue propanolol for rate control -INR 2.7,  hold Coumadin given GI bleed as above  3)COPD--- stable, bronchodilators as ordered  4)HTN--hold amlodipine and benazepril  -Continue propranolol, - may use IV labetalol when necessary  Every 4 hours for systolic blood pressure over 160 mmhg  5)HLD--continue Crestor  6)BPH with LUTs--- continue indwelling Foley catheter,   7)Class 2 Obesity-/OSA -Low calorie diet, portion control and increase physical activity discussed with patient -Body mass index is 33.97 kg/m. -CPAP Qhs   8) abnormal chest x-ray--  Nodular opacity in left lower lung. Recommend chest CT without contrast to exclude pulmonary neoplasm has been ordered and is pending  Disposition/Need for in-Hospital Stay- patient unable to be discharged at this time due to -acute GI bleed requiring transfusion and reversal of anticoagulation with FFP -Possible discharge home in a couple of days if GI bleed resolves*  Dispo: The patient is from: Home              Anticipated d/c is to: Home              Anticipated d/c date is: 2 days              Patient currently is not medically stable to d/c. Barriers: Not Clinically Stable-    With History of - Reviewed by me  Past Medical History:  Diagnosis Date   Abdominal aortic aneurysm without rupture    Bilateral renal cysts    BPH (benign prostatic hyperplasia)    Carotid artery occlusion    Chronic atrial fibrillation (HCC)    COPD (chronic obstructive pulmonary disease) ([place])    Diverticulosis    Pancolonic   Essential hypertension    Hemorrhoids    Hiatal hernia    Hx of adenomatous colonic polyps 01/17/2017  Hypercholesteremia    Liver cyst    Benign by liver biopsy September 2013   Nephrolithiasis    Nephrolithiasis    Pre-diabetes    Reflux esophagitis    Sleep apnea    Uses CPAP   Tubular adenoma       Past Surgical History:  Procedure Laterality Date   COLONOSCOPY     2003, no polyps per patient. Dr. West Carbo   COLONOSCOPY  11/01/11   Dr. Gala Romney- tubular adenoma,suboptimal preparation, internal hemorrhoids, o/w normal rectum. pancolonic diverticulosis   ESOPHAGOGASTRODUODENOSCOPY  11/01/11   Dr. Gala Romney- erosive reflux esophagitis along with a patulous EG junction, hialtal hernia, antral erosions with possible area of healing ulceration- s/p bx= chronic erosive gastritis, no malignancy   HERNIA REPAIR     x2, bilateral inguinal    KIDNEY STONE SURGERY     KNEE ARTHROSCOPY     X2   NASAL ENDOSCOPY      Chief Complaint  Patient presents with   Rectal  Bleeding      HPI:    Tristan Lopez  is a 87 y.o. male with past medical history relevant for HTN, history of pancolonic diverticulosis, history of reflux esophagitis and gastric ulcer, obesity and OSA, BPH with LUTS requiring chronic indwelling Foley, COPD, chronic atrial fibrillation on chronic anticoagulation with Coumadin who presents to the ED with concern for recurrent episodes of BRBPR since 11/26/2020 -Review of records shows that Patient had EGD and colonoscopy in October 2013--- at that time colonoscopy showed pancolonic diverticulosis and internal hemorrhoids, EGD showed reflux esophagitis, antral/gastric ulcers and hiatal hernia -  patient had dizziness and dyspnea on exertion, no syncope, no chest pains -in ED Hgb is down to 8.5 from a baseline usually around 12, MCV borderline high MCH normal, RDW normal -Platelets 155 -INR is 2.7, creatinine 0.86, LFTs WNL -Stool occult blood is positive --No abdominal pain, -No hematuria   Review of systems:    In addition to the HPI above,   A full Review of  Systems was done, all other systems reviewed are negative except as noted above in HPI , .    Social History:  Reviewed by me    Social History   Tobacco Use   Smoking status: Former    Packs/day: 0.50    Types: Cigarettes    Quit date: 1992    Years since quitting: 30.8   Smokeless tobacco: Never  Substance Use Topics   Alcohol use: No       Family History :  Reviewed by me    Family History  Problem Relation Age of Onset   Breast cancer Mother    Other Mother        Essential tremor   Stroke Mother    Heart disease Father        Pacemaker   Heart attack Maternal Grandfather    Colon cancer Neg Hx    Liver disease Neg Hx    Prostate cancer Neg Hx    Bladder Cancer Neg Hx    Kidney cancer Neg Hx     Home Medications:   Prior to Admission medications   Medication Sig Start Date End Date Taking? Authorizing Provider  amLODipine (NORVASC) 10 MG  tablet TAKE ONE TABLET BY MOUTH EVERY DAY 07/14/20 11/27/20 Yes Noreene Larsson, NP  Ascorbic Acid (VITAMIN C) 1000 MG tablet Take 1,000 mg by mouth daily.   Yes [provider]  benazepril (LOTENSIN) 40 MG tablet Take 1 tablet (  40 mg total) by mouth daily. 08/24/20  Yes Ailene Ards, NP  Cholecalciferol (VITAMIN D-3) 1000 units CAPS Take 2,000 Units by mouth.   Yes [provider]  clotrimazole-betamethasone (LOTRISONE) cream Apply 1 application topically 2 (two) times daily. 10/26/20  Yes McKenzie, Candee Furbish, MD  furosemide (LASIX) 20 MG tablet Take 1 tablet (20 mg total) by mouth daily. Take 20 mg daily as needed for leg swelling 11/08/20  Yes Noreene Larsson, NP  Garlic 1950 MG CAPS Take 1,000 mg by mouth daily.   Yes [provider]  pantoprazole (PROTONIX) 40 MG tablet Take 1 tablet (40 mg total) by mouth 2 (two) times daily. 11/08/20 12/08/20 Yes Noreene Larsson, NP  potassium chloride (KLOR-CON) 10 MEQ tablet Take 1 tablet (10 mEq total) by mouth daily. 11/08/20  Yes Noreene Larsson, NP  predniSONE (DELTASONE) 5 MG tablet Take 1 tablet (5 mg total) by mouth daily with breakfast. 10/19/20  Yes Noreene Larsson, NP  Probiotic Product (DIGESTIVE ADVANTAGE PO) Take by mouth 2 (two) times daily.    Yes [provider]  propranolol (INDERAL) 20 MG tablet Take 1 tablet (20 mg total) by mouth daily. 10/19/20  Yes Noreene Larsson, NP  rosuvastatin (CRESTOR) 10 MG tablet Take 1 tablet (10 mg total) by mouth daily. 08/23/20  Yes Noreene Larsson, NP  tadalafil (CIALIS) 5 MG tablet TAKE 1 TABLET (5 MG TOTAL) BY MOUTH DAILY. 06/06/20 11/27/20 Yes Noreene Larsson, NP  warfarin (COUMADIN) 5 MG tablet TAKE ONE TABLET BY MOUTH EVERY DAY 08/23/20  Yes Noreene Larsson, NP  potassium chloride (KLOR-CON) 10 MEQ tablet Take 10 mEq by mouth daily. Patient not taking: Reported on 11/27/2020 11/14/20   [provider]  silodosin (RAPAFLO) 8 MG CAPS capsule Take 1 capsule (8 mg total) by mouth  daily with breakfast. Patient not taking: No sig reported 08/12/20   Cleon Gustin, MD     Allergies:     Allergies  Allergen Reactions   Latex Rash     Physical Exam:   Vitals  Blood pressure (!) 131/58, pulse 81, temperature 98 F (36.7 C), temperature source Oral, resp. rate 18, height 5\' 9"  (1.753 m), weight 104.3 kg, SpO2 100 %.  Physical Examination: General appearance - alert,  and in no distress Mental status - alert, oriented to person, place, and time,  Eyes - sclera anicteric Ears- HOH Neck - supple, no JVD elevation , Chest - clear  to auscultation bilaterally, symmetrical air movement,  Heart - S1 and S2 normal, irregularly Abdomen - soft, nontender, nondistended, no masses or organomegaly Neurological - screening mental status exam normal, neck supple without rigidity, cranial nerves II through XII intact, DTR's normal and symmetric Extremities - no pedal edema noted, intact peripheral pulses  Skin - warm, dry GU- foley in situ without hematuria     Data Review:    CBC Recent Labs  Lab 11/27/20 0850 11/27/20 0911 11/27/20 1255  WBC 3.2*  --   --   HGB 8.5* 8.5* 8.2*  HCT 26.7* 25.0* 24.0*  PLT 155  --   --   MCV 100.4*  --   --   MCH 32.0  --   --   MCHC 31.8  --   --   RDW 14.2  --   --   LYMPHSABS 1.1  --   --   MONOABS 0.4  --   --   EOSABS 0.1  --   --  BASOSABS 0.0  --   --    ------------------------------------------------------------------------------------------------------------------  Chemistries  Recent Labs  Lab 11/27/20 0850 11/27/20 0911 11/27/20 1255  NA 138 139 140  K 3.7 3.6 3.6  CL 104 101 102  CO2 28  --   --   GLUCOSE 99 95 86  BUN 29* 27* 28*  CREATININE 0.86 0.90 1.00  CALCIUM 8.5*  --   --   AST 18  --   --   ALT 12  --   --   ALKPHOS 39  --   --   BILITOT 0.6  --   --    ------------------------------------------------------------------------------------------------------------------ estimated  creatinine clearance is 61.9 mL/min (by C-G formula based on SCr of 1 mg/dL). ------------------------------------------------------------------------------------------------------------------ No results for input(s): TSH, T4TOTAL, T3FREE, THYROIDAB in the last 72 hours.  Invalid input(s): FREET3   Coagulation profile Recent Labs  Lab 11/27/20 0850  INR 2.7*   ------------------------------------------------------------------------------------------------------------------- No results for input(s): DDIMER in the last 72 hours. -------------------------------------------------------------------------------------------------------------------  Cardiac Enzymes No results for input(s): CKMB, TROPONINI, MYOGLOBIN in the last 168 hours.  Invalid input(s): CK ------------------------------------------------------------------------------------------------------------------  Urinalysis    Component Value Date/Time   COLORURINE YELLOW 06/09/2020 0424   APPEARANCEUR Clear 09/21/2020 1431   LABSPEC 1.009 06/09/2020 0424   PHURINE 6.0 06/09/2020 0424   GLUCOSEU Negative 09/21/2020 1431   HGBUR NEGATIVE 06/09/2020 0424   BILIRUBINUR Negative 09/21/2020 1431   KETONESUR NEGATIVE 06/09/2020 0424   PROTEINUR Negative 09/21/2020 1431   PROTEINUR NEGATIVE 06/09/2020 0424   UROBILINOGEN 0.2 10/04/2011 2218   NITRITE Negative 09/21/2020 1431   NITRITE NEGATIVE 06/09/2020 0424   LEUKOCYTESUR Negative 09/21/2020 1431   LEUKOCYTESUR NEGATIVE 06/09/2020 0424    ----------------------------------------------------------------------------------------------------------------   Imaging Results:    DG Chest 1 View  Result Date: 11/27/2020 CLINICAL DATA:  Dyspnea.  Dizziness.  Weakness. EXAM: CHEST  1 VIEW COMPARISON:  07/28/2019 FINDINGS: The heart size and mediastinal contours are within normal limits. Aortic atherosclerotic calcification noted. No evidence of pulmonary infiltrate or pleural  effusion. A nodular opacity is also seen in the left lower lung adjacent to the left heart border, which was not seen on previous study. IMPRESSION: Nodular opacity in left lower lung. Recommend chest CT without contrast to exclude pulmonary neoplasm. Electronically Signed   By: Marlaine Hind M.D.   On: 11/27/2020 14:00    Radiological Exams on Admission: DG Chest 1 View  Result Date: 11/27/2020 CLINICAL DATA:  Dyspnea.  Dizziness.  Weakness. EXAM: CHEST  1 VIEW COMPARISON:  07/28/2019 FINDINGS: The heart size and mediastinal contours are within normal limits. Aortic atherosclerotic calcification noted. No evidence of pulmonary infiltrate or pleural effusion. A nodular opacity is also seen in the left lower lung adjacent to the left heart border, which was not seen on previous study. IMPRESSION: Nodular opacity in left lower lung. Recommend chest CT without contrast to exclude pulmonary neoplasm. Electronically Signed   By: Marlaine Hind M.D.   On: 11/27/2020 14:00    DVT Prophylaxis -SCD /Gi Bleed AM Labs Ordered, also please review Full Orders  Family Communication: Admission, patients condition and plan of care including tests being ordered have been discussed with the patient and daughter who indicate understanding and agree with the plan   Code Status - Full Code  Likely DC to home after resolution of GI bleed  Condition   stable  Roxan Hockey M.D on 11/27/2020 at 6:45 PM Go to [email or web address] -  for contact info  [facility] -  Office  [phone]

## 2020-11-27 NOTE — ED Notes (Signed)
Pharmacy reports pt will have to take hospital provided medications and will not be able to take home medication supply while hospitalized. Pharmacy reports that it is out of the control of the facility as to what [facility] will pay for.

## 2020-11-28 ENCOUNTER — Encounter (HOSPITAL_COMMUNITY): Payer: Self-pay | Admitting: Family Medicine

## 2020-11-28 DIAGNOSIS — I10 Essential (primary) hypertension: Secondary | ICD-10-CM | POA: Diagnosis present

## 2020-11-28 DIAGNOSIS — I714 Abdominal aortic aneurysm, without rupture, unspecified: Secondary | ICD-10-CM | POA: Diagnosis present

## 2020-11-28 DIAGNOSIS — K219 Gastro-esophageal reflux disease without esophagitis: Secondary | ICD-10-CM | POA: Diagnosis not present

## 2020-11-28 DIAGNOSIS — Z7901 Long term (current) use of anticoagulants: Secondary | ICD-10-CM | POA: Diagnosis not present

## 2020-11-28 DIAGNOSIS — K317 Polyp of stomach and duodenum: Secondary | ICD-10-CM | POA: Diagnosis present

## 2020-11-28 DIAGNOSIS — D62 Acute posthemorrhagic anemia: Secondary | ICD-10-CM | POA: Diagnosis present

## 2020-11-28 DIAGNOSIS — E7800 Pure hypercholesterolemia, unspecified: Secondary | ICD-10-CM | POA: Diagnosis present

## 2020-11-28 DIAGNOSIS — K635 Polyp of colon: Secondary | ICD-10-CM | POA: Diagnosis present

## 2020-11-28 DIAGNOSIS — K2960 Other gastritis without bleeding: Secondary | ICD-10-CM | POA: Diagnosis present

## 2020-11-28 DIAGNOSIS — K5731 Diverticulosis of large intestine without perforation or abscess with bleeding: Secondary | ICD-10-CM | POA: Diagnosis present

## 2020-11-28 DIAGNOSIS — K625 Hemorrhage of anus and rectum: Secondary | ICD-10-CM | POA: Diagnosis present

## 2020-11-28 DIAGNOSIS — G4733 Obstructive sleep apnea (adult) (pediatric): Secondary | ICD-10-CM | POA: Diagnosis present

## 2020-11-28 DIAGNOSIS — D6832 Hemorrhagic disorder due to extrinsic circulating anticoagulants: Secondary | ICD-10-CM | POA: Diagnosis present

## 2020-11-28 DIAGNOSIS — Z823 Family history of stroke: Secondary | ICD-10-CM | POA: Diagnosis not present

## 2020-11-28 DIAGNOSIS — I4891 Unspecified atrial fibrillation: Secondary | ICD-10-CM | POA: Diagnosis not present

## 2020-11-28 DIAGNOSIS — E6609 Other obesity due to excess calories: Secondary | ICD-10-CM | POA: Diagnosis present

## 2020-11-28 DIAGNOSIS — I700 Atherosclerosis of aorta: Secondary | ICD-10-CM | POA: Diagnosis present

## 2020-11-28 DIAGNOSIS — Z20822 Contact with and (suspected) exposure to covid-19: Secondary | ICD-10-CM | POA: Diagnosis present

## 2020-11-28 DIAGNOSIS — K922 Gastrointestinal hemorrhage, unspecified: Secondary | ICD-10-CM | POA: Diagnosis not present

## 2020-11-28 DIAGNOSIS — Z6833 Body mass index (BMI) 33.0-33.9, adult: Secondary | ICD-10-CM | POA: Diagnosis not present

## 2020-11-28 DIAGNOSIS — J449 Chronic obstructive pulmonary disease, unspecified: Secondary | ICD-10-CM | POA: Diagnosis present

## 2020-11-28 DIAGNOSIS — K2100 Gastro-esophageal reflux disease with esophagitis, without bleeding: Secondary | ICD-10-CM | POA: Diagnosis present

## 2020-11-28 DIAGNOSIS — D122 Benign neoplasm of ascending colon: Secondary | ICD-10-CM | POA: Diagnosis not present

## 2020-11-28 DIAGNOSIS — R7303 Prediabetes: Secondary | ICD-10-CM | POA: Diagnosis present

## 2020-11-28 DIAGNOSIS — I482 Chronic atrial fibrillation, unspecified: Secondary | ICD-10-CM | POA: Diagnosis present

## 2020-11-28 DIAGNOSIS — I2510 Atherosclerotic heart disease of native coronary artery without angina pectoris: Secondary | ICD-10-CM | POA: Diagnosis present

## 2020-11-28 DIAGNOSIS — K449 Diaphragmatic hernia without obstruction or gangrene: Secondary | ICD-10-CM | POA: Diagnosis present

## 2020-11-28 DIAGNOSIS — N401 Enlarged prostate with lower urinary tract symptoms: Secondary | ICD-10-CM | POA: Diagnosis present

## 2020-11-28 DIAGNOSIS — Z66 Do not resuscitate: Secondary | ICD-10-CM | POA: Diagnosis present

## 2020-11-28 LAB — GLUCOSE, CAPILLARY
Glucose-Capillary: 102 mg/dL — ABNORMAL HIGH (ref 70–99)
Glucose-Capillary: 106 mg/dL — ABNORMAL HIGH (ref 70–99)
Glucose-Capillary: 107 mg/dL — ABNORMAL HIGH (ref 70–99)
Glucose-Capillary: 107 mg/dL — ABNORMAL HIGH (ref 70–99)
Glucose-Capillary: 124 mg/dL — ABNORMAL HIGH (ref 70–99)
Glucose-Capillary: 93 mg/dL (ref 70–99)

## 2020-11-28 LAB — PREPARE FRESH FROZEN PLASMA
Unit division: 0
Unit division: 0

## 2020-11-28 LAB — BPAM FFP
Blood Product Expiration Date: 202211112359
Blood Product Expiration Date: 202211112359
ISSUE DATE / TIME: 202211061410
ISSUE DATE / TIME: 202211061442
Unit Type and Rh: 8400
Unit Type and Rh: 8400

## 2020-11-28 LAB — CBC
HCT: 21.7 % — ABNORMAL LOW (ref 39.0–52.0)
Hemoglobin: 7.1 g/dL — ABNORMAL LOW (ref 13.0–17.0)
MCH: 31.8 pg (ref 26.0–34.0)
MCHC: 32.7 g/dL (ref 30.0–36.0)
MCV: 97.3 fL (ref 80.0–100.0)
Platelets: 130 10*3/uL — ABNORMAL LOW (ref 150–400)
RBC: 2.23 MIL/uL — ABNORMAL LOW (ref 4.22–5.81)
RDW: 17.2 % — ABNORMAL HIGH (ref 11.5–15.5)
WBC: 3.5 10*3/uL — ABNORMAL LOW (ref 4.0–10.5)
nRBC: 0 % (ref 0.0–0.2)

## 2020-11-28 LAB — BASIC METABOLIC PANEL
Anion gap: 6 (ref 5–15)
BUN: 26 mg/dL — ABNORMAL HIGH (ref 8–23)
CO2: 28 mmol/L (ref 22–32)
Calcium: 8.1 mg/dL — ABNORMAL LOW (ref 8.9–10.3)
Chloride: 107 mmol/L (ref 98–111)
Creatinine, Ser: 0.88 mg/dL (ref 0.61–1.24)
GFR, Estimated: >60 mL/min (ref >60)
Glucose, Bld: 114 mg/dL — ABNORMAL HIGH (ref 70–99)
Potassium: 3.6 mmol/L (ref 3.5–5.1)
Sodium: 141 mmol/L (ref 135–145)

## 2020-11-28 LAB — HEMOGLOBIN AND HEMATOCRIT, BLOOD
HCT: 21.1 % — ABNORMAL LOW (ref 39.0–52.0)
HCT: 24.3 % — ABNORMAL LOW (ref 39.0–52.0)
Hemoglobin: 7 g/dL — ABNORMAL LOW (ref 13.0–17.0)
Hemoglobin: 8.1 g/dL — ABNORMAL LOW (ref 13.0–17.0)

## 2020-11-28 LAB — PREPARE RBC (CROSSMATCH)

## 2020-11-28 LAB — PROTIME-INR
INR: 1.9 — ABNORMAL HIGH (ref 0.8–1.2)
Prothrombin Time: 21.5 seconds — ABNORMAL HIGH (ref 11.4–15.2)

## 2020-11-28 MED ORDER — VITAMIN K1 10 MG/ML IJ SOLN
10.0000 mg | Freq: Once | INTRAVENOUS | Status: AC
  Administered 2020-11-28: 10 mg via INTRAVENOUS
  Filled 2020-11-28: qty 1

## 2020-11-28 MED ORDER — FUROSEMIDE 10 MG/ML IJ SOLN
20.0000 mg | Freq: Once | INTRAMUSCULAR | Status: AC
  Administered 2020-11-28: 20 mg via INTRAVENOUS
  Filled 2020-11-28: qty 2

## 2020-11-28 MED ORDER — PEG 3350-KCL-NA BICARB-NACL 420 G PO SOLR
4000.0000 mL | Freq: Once | ORAL | Status: AC
  Administered 2020-11-28: 4000 mL via ORAL

## 2020-11-28 MED ORDER — SODIUM CHLORIDE 0.9% IV SOLUTION: Freq: Once | INTRAVENOUS | Status: AC

## 2020-11-28 NOTE — Progress Notes (Signed)
PROGRESS NOTE     Tristan Lopez, is a 87 y.o. male, DOB - [birth year], ZOX:[id number]  Admit date - 11/27/2020   Admitting Physician Nishan Ovens Denton Brick, MD  Outpatient Primary MD for the patient is Noreene Larsson, NP  LOS - 0  Chief Complaint  Patient presents with   Rectal Bleeding        Brief Narrative:   87 y.o. male with past medical history relevant for HTN, history of pancolonic diverticulosis, history of reflux esophagitis and gastric ulcer, obesity and OSA, BPH with LUTS requiring chronic indwelling Foley, COPD, chronic atrial fibrillation on chronic anticoagulation with Coumadin admitted on 11/27/2020 with bright red blood per rectum/acute GI bleed with hemoglobin dropping from a baseline around 12 to around 8, in the setting of therapeutic INR of 2.7  Assessment & Plan:   Principal Problem:   Acute GI bleeding Active Problems:   Atrial fibrillation ([place])   Long term (current) use of anticoagulants   GERD (gastroesophageal reflux disease)   Essential hypertension   AAA (abdominal aortic aneurysm) without rupture   COPD (chronic obstructive pulmonary disease) ([place])   1)Acute GI bleed in the setting of chronic anticoagulation with INR of 2.7 --Review of records shows that Patient had EGD and colonoscopy in October 2013--- at that time colonoscopy showed pancolonic diverticulosis and internal hemorrhoids, EGD showed reflux esophagitis, antral/gastric ulcers and hiatal hernia -Cannot rule out diverticular bleed - Hgb baseline usually around 12, -11/27/20---received FFP x2 units, transfuse 1 units of PRBC -11/28/20 Hgb 7.0---, INR 1.9 -Patient continues to have BRBPR on 11/28/20--- so we will transfuse additional 2 units of PRBC on 11/28/2020 and give vitamin K --GI consult appreciated --plan is for colonoscopy and EGD on 11/29/2020  -- CTA abdomen and pelvis from 11/27/2020 without active bleeding if bleeding  -Protonix as ordered -No abdominal pain, -No hematuria   2)  chronic atrial fibrillation---- -Continue propanolol for rate control -Coumadin remains on hold Anticoagulation as noted above #1   3)COPD--- stable, bronchodilators as ordered   4)HTN--c/n to hold hold amlodipine and benazepril  -Continue propranolol, - may use IV labetalol when necessary  Every 4 hours for systolic blood pressure over 160 mmhg   5)HLD--continue Crestor   6)BPH with LUTs--- continue indwelling Foley catheter, no hematuria   7)Class 2 Obesity-/OSA -Low calorie diet, portion control and increase physical activity discussed with patient -Body mass index is 33.97 kg/m. -CPAP Qhs    8) abnormal chest x-ray-- Nodular opacity in left lower lung. Recommend chest CT chest without contrast without acute or concerning findings  Disposition/Need for in-Hospital Stay- patient unable to be discharged at this time due to -acute GI bleed requiring transfusion and reversal of anticoagulation with FFP, transfusion of PRBCs and prep for endoluminal evaluation -Possible discharge home in a couple of days if GI bleed resolves*   Dispo: The patient is from: Home              Anticipated d/c is to: Home              Anticipated d/c date is: 2 days              Patient currently is not medically stable to d/c. Barriers: Not Clinically Stable-   Code Status :  -  Code Status: Full Code   Family Communication:-  (patient is alert, awake and coherent)  Discussed with son at bedside  Consults  :  Gi  DVT Prophylaxis  :   - SCDs  SCDs Start: 11/27/20 1901 Place TED hose Start: 11/27/20 1901    Lab Results  Component Value Date   PLT 130 (L) 11/28/2020    Inpatient Medications  Scheduled Meds:  furosemide  20 mg Intravenous Once   multivitamin with minerals  1 tablet Oral Daily   pantoprazole (PROTONIX) IV  40 mg Intravenous Q12H   propranolol  20 mg Oral Daily   rosuvastatin  10 mg Oral Daily   sodium chloride flush  3 mL Intravenous Q12H   sodium chloride flush  3 mL  Intravenous Q12H   Continuous Infusions:  sodium chloride     PRN Meds:.sodium chloride, acetaminophen **OR** acetaminophen, albuterol, bisacodyl, labetalol, ondansetron **OR** ondansetron (ZOFRAN) IV, polyethylene glycol, sodium chloride flush, traZODone   Anti-infectives (From admission, onward)    None        Subjective: Tristan Lopez today has no fevers, no emesis,  No chest pain,   -Patient son at bedside, questions answered -Continues to have BRBPR-- -Denies dizziness or abdominal pain -No emesis, no hematuria   Objective: Vitals:   11/27/20 2023 11/28/20 0527 11/28/20 0945 11/28/20 1013  BP: 119/72 130/89 128/75 123/63  Pulse: (!) 101 (!) 102 94 88  Resp:  19 19 18   Temp:  98.5 F (36.9 C) 97.9 F (36.6 C) 97.8 F (36.6 C)  TempSrc:  Oral Oral   SpO2: 98% 99% 99%   Weight:      Height:        Intake/Output Summary (Last 24 hours) at 11/28/2020 1156 Last data filed at 11/27/2020 1609 Gross per 24 hour  Intake 796.9 ml  Output --  Net 796.9 ml   Filed Weights   11/27/20 0832  Weight: 104.3 kg    Physical Exam  Gen:- Awake Alert,  in no apparent distress  HEENT:- [place].AT, No sclera icterus Ears--HOH Neck-Supple Neck,No JVD,.  Lungs-  CTAB , fair symmetrical air movement CV- S1, S2 normal, irregularly irregular Abd-  +ve B.Sounds, Abd Soft, No tenderness,    Extremity/Skin:- No  edema, pedal pulses present Psych-affect is appropriate, oriented x3 Neuro-no new focal deficits, no tremors GU-Foley catheter noted, no hematuria  Data Reviewed: I have personally reviewed following labs and imaging studies  CBC: Recent Labs  Lab 11/27/20 0850 11/27/20 0911 11/27/20 1255 11/27/20 1957 11/27/20 2236 11/28/20 0014 11/28/20 0610  WBC 3.2*  --   --  3.9*  --   --  3.5*  NEUTROABS 1.6*  --   --   --   --   --   --   HGB 8.5*   < > 8.2* 7.6* 6.9* 7.0* 7.1*  HCT 26.7*   < > 24.0* 23.3* 21.1* 21.1* 21.7*  MCV 100.4*  --   --  97.1  --   --  97.3  PLT  155  --   --  129*  --   --  130*   < > = values in this interval not displayed.   Basic Metabolic Panel: Recent Labs  Lab 11/27/20 0850 11/27/20 0911 11/27/20 1255 11/28/20 0610  NA 138 139 140 141  K 3.7 3.6 3.6 3.6  CL 104 101 102 107  CO2 28  --   --  28  GLUCOSE 99 95 86 114*  BUN 29* 27* 28* 26*  CREATININE 0.86 0.90 1.00 0.88  CALCIUM 8.5*  --   --  8.1*   GFR: Estimated Creatinine Clearance: 70.3 mL/min (by C-G formula based on SCr of 0.88 mg/dL). Liver  Function Tests: Recent Labs  Lab 11/27/20 0850  AST 18  ALT 12  ALKPHOS 39  BILITOT 0.6  PROT 5.6*  ALBUMIN 3.2*   No results for input(s): LIPASE, AMYLASE in the last 168 hours. No results for input(s): AMMONIA in the last 168 hours. Coagulation Profile: Recent Labs  Lab 11/27/20 0850 11/27/20 1957 11/28/20 0610  INR 2.7* 1.8* 1.9*   Cardiac Enzymes: No results for input(s): CKTOTAL, CKMB, CKMBINDEX, TROPONINI in the last 168 hours. BNP (last 3 results) No results for input(s): PROBNP in the last 8760 hours. HbA1C: No results for input(s): HGBA1C in the last 72 hours. CBG: Recent Labs  Lab 11/28/20 0027 11/28/20 0607 11/28/20 0812 11/28/20 1134  GLUCAP 107* 106* 93 124*   Lipid Profile: No results for input(s): CHOL, HDL, LDLCALC, TRIG, CHOLHDL, LDLDIRECT in the last 72 hours. Thyroid Function Tests: No results for input(s): TSH, T4TOTAL, FREET4, T3FREE, THYROIDAB in the last 72 hours. Anemia Panel: No results for input(s): VITAMINB12, FOLATE, FERRITIN, TIBC, IRON, RETICCTPCT in the last 72 hours. Urine analysis:    Component Value Date/Time   COLORURINE YELLOW 06/09/2020 0424   APPEARANCEUR Clear 09/21/2020 1431   LABSPEC 1.009 06/09/2020 0424   PHURINE 6.0 06/09/2020 0424   GLUCOSEU Negative 09/21/2020 1431   HGBUR NEGATIVE 06/09/2020 0424   BILIRUBINUR Negative 09/21/2020 1431   KETONESUR NEGATIVE 06/09/2020 0424   PROTEINUR Negative 09/21/2020 1431   PROTEINUR NEGATIVE 06/09/2020  0424   UROBILINOGEN 0.2 10/04/2011 2218   NITRITE Negative 09/21/2020 1431   NITRITE NEGATIVE 06/09/2020 0424   LEUKOCYTESUR Negative 09/21/2020 1431   LEUKOCYTESUR NEGATIVE 06/09/2020 0424   Sepsis Labs: @LABRCNTIP (procalcitonin:4,lacticidven:4)  Recent Results (from the past 240 hour(s))  Resp Panel by RT-PCR (Flu A&B, Covid) Nasopharyngeal Swab     Status: None   Collection Time: 11/27/20 11:55 AM   Specimen: Nasopharyngeal Swab; Nasopharyngeal(NP) swabs in vial transport medium  Result Value Ref Range Status   SARS Coronavirus 2 by RT PCR NEGATIVE NEGATIVE Final    Comment: (NOTE) SARS-CoV-2 target nucleic acids are NOT DETECTED.  The SARS-CoV-2 RNA is generally detectable in upper respiratory specimens during the acute phase of infection. The lowest concentration of SARS-CoV-2 viral copies this assay can detect is 138 copies/mL. A negative result does not preclude SARS-Cov-2 infection and should not be used as the sole basis for treatment or other patient management decisions. A negative result may occur with  improper specimen collection/handling, submission of specimen other than nasopharyngeal swab, presence of viral mutation(s) within the areas targeted by this assay, and inadequate number of viral copies(<138 copies/mL). A negative result must be combined with clinical observations, patient history, and epidemiological information. The expected result is Negative.  Fact Sheet for Patients:  EntrepreneurPulse.com.au  Fact Sheet for Healthcare Providers:  IncredibleEmployment.be  This test is no t yet approved or cleared by the [place] FDA and  has been authorized for detection and/or diagnosis of SARS-CoV-2 by FDA under an Emergency Use Authorization (EUA). This EUA will remain  in effect (meaning this test can be used) for the duration of the COVID-19 declaration under Section 564(b)(1) of the Act, 21 U.S.C.section  360bbb-3(b)(1), unless the authorization is terminated  or revoked sooner.       Influenza A by PCR NEGATIVE NEGATIVE Final   Influenza B by PCR NEGATIVE NEGATIVE Final    Comment: (NOTE) The Xpert Xpress SARS-CoV-2/FLU/RSV plus assay is intended as an aid in the diagnosis of influenza from Nasopharyngeal swab specimens and  should not be used as a sole basis for treatment. Nasal washings and aspirates are unacceptable for Xpert Xpress SARS-CoV-2/FLU/RSV testing.  Fact Sheet for Patients: EntrepreneurPulse.com.au  Fact Sheet for Healthcare Providers: IncredibleEmployment.be  This test is not yet approved or cleared by the [place] FDA and has been authorized for detection and/or diagnosis of SARS-CoV-2 by FDA under an Emergency Use Authorization (EUA). This EUA will remain in effect (meaning this test can be used) for the duration of the COVID-19 declaration under Section 564(b)(1) of the Act, 21 U.S.C. section 360bbb-3(b)(1), unless the authorization is terminated or revoked.  Performed at [facility], [address], [place], [place] [address]    Radiology Studies: DG Chest 1 View  Result Date: 11/27/2020 CLINICAL DATA:  Dyspnea.  Dizziness.  Weakness. EXAM: CHEST  1 VIEW COMPARISON:  07/28/2019 FINDINGS: The heart size and mediastinal contours are within normal limits. Aortic atherosclerotic calcification noted. No evidence of pulmonary infiltrate or pleural effusion. A nodular opacity is also seen in the left lower lung adjacent to the left heart border, which was not seen on previous study. IMPRESSION: Nodular opacity in left lower lung. Recommend chest CT without contrast to exclude pulmonary neoplasm. Electronically Signed   By: Marlaine Hind M.D.   On: 11/27/2020 14:00   CT CHEST WO CONTRAST  Result Date: 11/27/2020 CLINICAL DATA:  Possible mass on recent chest x-ray EXAM: CT CHEST WITHOUT CONTRAST TECHNIQUE: Multidetector CT imaging  of the chest was performed following the standard protocol without IV contrast. COMPARISON:  Chest x-ray from earlier in the same day, CT from 06/06/2020 FINDINGS: Cardiovascular: Somewhat limited due to lack of IV contrast. Atherosclerotic calcifications are noted. No aneurysmal dilatation is seen. Heart is mildly enlarged in size with diffuse coronary calcifications identified. Mediastinum/Nodes: Thoracic inlet is within normal limits. No sizable hilar or mediastinal adenopathy is noted. The esophagus as visualized is within normal limits. Lungs/Pleura: Lungs are well aerated bilaterally. Right lung is clear. No focal infiltrate or sizable effusion is noted. Left lung demonstrates a focal fluid attenuation lesion along the left pericardial border which has decreased in size when compared with the prior exam. It previously measured 3.3 cm in greatest transverse dimension and now measures 19 mm in greatest dimension. This likely represents a resolving pericardial cyst or small amount of fluid within the fissure. Given its decrease in size over the past 6 months it is felt to be benign in etiology. No other focal abnormality is noted. Upper Abdomen: As described in recent CT of the abdomen and pelvis on the same day. Musculoskeletal: Degenerative changes of the thoracic spine are seen. IMPRESSION: Decreasing fluid attenuation lesion along the left cardiac border when compared with the prior exam. This may represent a shrinking pericardial cyst or fluid within the fissure on the left. It is felt to be benign in etiology given its decrease in size. No other focal abnormality is noted. Aortic Atherosclerosis (ICD10-I70.0). Electronically Signed   By: Inez Catalina M.D.   On: 11/27/2020 21:21   CT ANGIO GI BLEED  Result Date: 11/27/2020 CLINICAL DATA:  Rectal bleeding for 1 day EXAM: CTA ABDOMEN AND PELVIS WITHOUT AND WITH CONTRAST TECHNIQUE: Multidetector CT imaging of the abdomen and pelvis was performed using the  standard protocol during bolus administration of intravenous contrast. Multiplanar reconstructed images and MIPs were obtained and reviewed to evaluate the vascular anatomy. CONTRAST:  138mL OMNIPAQUE IOHEXOL 350 MG/ML SOLN COMPARISON:  06/06/2020 FINDINGS: VASCULAR Aorta: Atherosclerotic calcifications are noted. Fusiform dilatation  of the infrarenal aorta is noted to 3.9 cm. This is slightly increased when compared with the prior exam at which time it measured 3.8 cm. No evidence of dissection is noted. Tapering at the aortic bifurcation is noted. Celiac: Patent without evidence of aneurysm, dissection, vasculitis or significant stenosis. SMA: Atherosclerotic calcifications are noted although no significant stenosis is seen. Renals: Dual renal arteries are noted on the right. Single renal artery is noted on the left. Heavy calcifications of the left renal artery is seen with only mild stenosis. Mild narrowing at the origin of the right main renal artery is noted as well. IMA: Patent without evidence of aneurysm, dissection, vasculitis or significant stenosis. Inflow: Iliacs are heavily calcified although no aneurysmal dilatation or dissection is seen. Proximal Outflow: Within normal limits. Veins: No specific venous abnormality is noted. Review of the MIP images confirms the above findings. NON-VASCULAR Lower chest: Lung bases are free of acute infiltrate or sizable effusion. There is a rounded fluid attenuation lesion identified adjacent to the left pericardium which has decreased in size significantly when compared with the prior CT examination at which time it measured approximately 3.4 cm. It now measures 2.0 cm in greatest dimension. Hepatobiliary: Previously seen partially calcified lesion within the dome of the right lobe of the liver anteriorly is again identified and stable in appearance. A similar lesion is noted in the anterior aspect of the lateral segment of the left lobe of the liver also stable in  appearance from the prior study. Gallbladder is within normal limits. No other hepatic abnormality is seen. Pancreas: Unremarkable. No pancreatic ductal dilatation or surrounding inflammatory changes. Spleen: Normal in size without focal abnormality. Adrenals/Urinary Tract: Adrenal glands are within normal limits bilaterally. Kidneys are well visualized bilaterally and again demonstrate multiple bilateral renal cysts stable in appearance from the prior exam. Scattered vascular calcifications are noted. No definitive renal calculi or obstructive changes are seen. The bladder is decompressed by Foley catheter. Stomach/Bowel: Scattered diverticular change of the colon is noted. No findings of diverticulitis are seen. No obstructive or inflammatory changes are noted. The appendix is within normal limits. No focal pooling of contrast material is identified to suggest active GI hemorrhage. No small bowel or gastric abnormality is noted. Lymphatic: No significant lymphadenopathy is seen. Reproductive: Prostate is enlarged indenting upon the inferior aspect of the bladder. Other: No abdominal wall hernia or abnormality. No abdominopelvic ascites. Musculoskeletal: Degenerative changes of lumbar spine are noted. 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 This is slightly increased in size when compared with the prior exam at which time it measured 3.8 cm. Recommend follow-up every 2 years. Reference: J Am Coll Radiol 8088;11:031-594. No focal pooling of contrast material is identified to suggest active GI hemorrhage. Scattered atherosclerotic changes are noted as described. NON-VASCULAR Diverticular change without diverticulitis. Stable renal cysts when compare with the prior exam. Stable hepatic cystic lesions when compared with the prior CT. Calcifications are seen likely related to prior hemorrhage. Rounded fluid collection adjacent to the pericardium likely representing a small  pericardial cyst or trapped fluid within the fissure. This is decreased in size from the most recent exam from May of 2022. Electronically Signed   By: Inez Catalina M.D.   On: 11/27/2020 21:15    Scheduled Meds:  furosemide  20 mg Intravenous Once   multivitamin with minerals  1 tablet Oral Daily   pantoprazole (PROTONIX) IV  40 mg Intravenous Q12H   propranolol  20 mg  Oral Daily   rosuvastatin  10 mg Oral Daily   sodium chloride flush  3 mL Intravenous Q12H   sodium chloride flush  3 mL Intravenous Q12H   Continuous Infusions:  sodium chloride      LOS: 0 days   Roxan Hockey M.D on 11/28/2020 at 11:56 AM  Go to [email or web address] - for contact info  [facility] - Office  [phone]  If 7PM-7AM, please contact night-coverage [email or web address] Password [facility] 11/28/2020, 11:56 AM

## 2020-11-28 NOTE — Consult Note (Signed)
Referring Provider: Dr. Chales Salmon Primary Care Physician:  Noreene Larsson, NP Primary Gastroenterologist:  Dr. Gala Romney   Date of Admission: 11/27/20 Date of Consultation: 11/28/20  Reason for Consultation:  GI bleed  HPI:  Tristan Lopez is a very pleasant 87 y.o. year old male who presented to the ED yesterday with acute GI bleeding in setting of Coumadin for afib. Baseline Hgb 11/12 range upon review of epic dating back to 2021. Presenting Hgb 8.5, down to 6.9. Received 2 units FFP yesterday and 1 unit PRBCs. Additional 2 units PRBCs ordered for today.  INR 2.7 on admission, now 1.9. CTA yesterday evening negative for active bleeding. Additional incidental findings as listed below. Coumadin on hold.   Patient's son is at bedside. Patient black stool starting 4-5 days ago. States initially he did not realize this was blood. Changed to bright red blood per rectum, large amounts. Felt fatigued, short of breath. Weak. 2 episodes of large volume rectal bleeding yesterday. This morning noted another episode of bright red blood per rectum. Notes chronic history of abdominal "gurgling" and some discomfort diffusely historically but not associated with this episode. Denies epigastric pain, nausea, vomiting, dysphagia, early satiety. Rare NSAIDs. On prednisone 5 mg daily. Protonix 40 mg BID. Feels weak this morning.    Colonoscopy 2013: pancolonic diverticulosis, colon polyp  (tubular adenoma).  EGD 2013: erosive reflux esophagitis along with patulous EJ junction, hiatal hernia, antral erosions with possible are of healing ulceration s/p biopsy. (Chronic erosive gastritis on path, negative H.pylori. )   Past Medical History:  Diagnosis Date   Abdominal aortic aneurysm without rupture    Bilateral renal cysts    BPH (benign prostatic hyperplasia)    Carotid artery occlusion    Chronic atrial fibrillation (HCC)    COPD (chronic obstructive pulmonary disease) ([place])    Diverticulosis     Pancolonic   Essential hypertension    Hemorrhoids    Hiatal hernia    Hx of adenomatous colonic polyps 01/17/2017   Hypercholesteremia    Liver cyst    Benign by liver biopsy September 2013   Nephrolithiasis    Nephrolithiasis    Pre-diabetes    Reflux esophagitis    Sleep apnea    Uses CPAP   Tubular adenoma     Past Surgical History:  Procedure Laterality Date   COLONOSCOPY     2003, no polyps per patient. Dr. West Carbo   COLONOSCOPY  11/01/11   Dr. Gala Romney- tubular adenoma,suboptimal preparation, internal hemorrhoids, o/w normal rectum. pancolonic diverticulosis   ESOPHAGOGASTRODUODENOSCOPY  11/01/11   Dr. Gala Romney- erosive reflux esophagitis along with a patulous EG junction, hialtal hernia, antral erosions with possible area of healing ulceration- s/p bx= chronic erosive gastritis, no malignancy   HERNIA REPAIR     x2, bilateral inguinal    KIDNEY STONE SURGERY     KNEE ARTHROSCOPY     X2   NASAL ENDOSCOPY      Prior to Admission medications   Medication Sig Start Date End Date Taking? Authorizing Provider  amLODipine (NORVASC) 10 MG tablet TAKE ONE TABLET BY MOUTH EVERY DAY 07/14/20 11/27/20 Yes Noreene Larsson, NP  Ascorbic Acid (VITAMIN C) 1000 MG tablet Take 1,000 mg by mouth daily.   Yes [provider]  benazepril (LOTENSIN) 40 MG tablet Take 1 tablet (40 mg total) by mouth daily. 08/24/20  Yes Ailene Ards, NP  Cholecalciferol (VITAMIN D-3) 1000 units CAPS Take 2,000 Units by mouth.   Yes [provider]  clotrimazole-betamethasone (LOTRISONE) cream Apply 1 application topically 2 (two) times daily. 10/26/20  Yes McKenzie, Candee Furbish, MD  furosemide (LASIX) 20 MG tablet Take 1 tablet (20 mg total) by mouth daily. Take 20 mg daily as needed for leg swelling 11/08/20  Yes Noreene Larsson, NP  Garlic 2376 MG CAPS Take 1,000 mg by mouth daily.   Yes [provider]  pantoprazole (PROTONIX) 40 MG tablet Take 1 tablet (40 mg total) by mouth 2 (two)  times daily. 11/08/20 12/08/20 Yes Noreene Larsson, NP  potassium chloride (KLOR-CON) 10 MEQ tablet Take 1 tablet (10 mEq total) by mouth daily. 11/08/20  Yes Noreene Larsson, NP  predniSONE (DELTASONE) 5 MG tablet Take 1 tablet (5 mg total) by mouth daily with breakfast. 10/19/20  Yes Noreene Larsson, NP  Probiotic Product (DIGESTIVE ADVANTAGE PO) Take by mouth 2 (two) times daily.    Yes [provider]  propranolol (INDERAL) 20 MG tablet Take 1 tablet (20 mg total) by mouth daily. 10/19/20  Yes Noreene Larsson, NP  rosuvastatin (CRESTOR) 10 MG tablet Take 1 tablet (10 mg total) by mouth daily. 08/23/20  Yes Noreene Larsson, NP  tadalafil (CIALIS) 5 MG tablet TAKE 1 TABLET (5 MG TOTAL) BY MOUTH DAILY. 06/06/20 11/27/20 Yes Noreene Larsson, NP  warfarin (COUMADIN) 5 MG tablet TAKE ONE TABLET BY MOUTH EVERY DAY 08/23/20  Yes Noreene Larsson, NP  potassium chloride (KLOR-CON) 10 MEQ tablet Take 10 mEq by mouth daily. Patient not taking: Reported on 11/27/2020 11/14/20   [provider]  silodosin (RAPAFLO) 8 MG CAPS capsule Take 1 capsule (8 mg total) by mouth daily with breakfast. Patient not taking: No sig reported 08/12/20   Cleon Gustin, MD    Current Facility-Administered Medications  Medication Dose Route Frequency Provider Last Rate Last Admin   0.9 %  sodium chloride infusion (Manually program via Guardrails IV Fluids)   Intravenous Once Emokpae, Courage, MD       0.9 %  sodium chloride infusion  250 mL Intravenous PRN D[place] Brick, Courage, MD       acetaminophen (TYLENOL) tablet 650 mg  650 mg Oral Q6H PRN Emokpae, Courage, MD       Or   acetaminophen (TYLENOL) suppository 650 mg  650 mg Rectal Q6H PRN Emokpae, Courage, MD       albuterol (PROVENTIL) (2.5 MG/3ML) 0.083% nebulizer solution 2.5 mg  2.5 mg Nebulization Q2H PRN Emokpae, Courage, MD       bisacodyl (DULCOLAX) suppository 10 mg  10 mg Rectal Daily PRN Emokpae, Courage, MD       furosemide (LASIX) injection 20 mg  20 mg  Intravenous Once Emokpae, Courage, MD       furosemide (LASIX) injection 20 mg  20 mg Intravenous Once Emokpae, Courage, MD       labetalol (NORMODYNE) injection 10 mg  10 mg Intravenous Q4H PRN Emokpae, Courage, MD       multivitamin with minerals tablet 1 tablet  1 tablet Oral Daily Emokpae, Courage, MD       ondansetron (ZOFRAN) tablet 4 mg  4 mg Oral Q6H PRN Emokpae, Courage, MD       Or   ondansetron (ZOFRAN) injection 4 mg  4 mg Intravenous Q6H PRN Emokpae, Courage, MD       pantoprazole (PROTONIX) injection 40 mg  40 mg Intravenous Q12H Emokpae, Courage, MD   40 mg at 11/27/20 2051   polyethylene glycol (  MIRALAX / GLYCOLAX) packet 17 g  17 g Oral Daily PRN Emokpae, Courage, MD       propranolol (INDERAL) tablet 20 mg  20 mg Oral Daily Emokpae, Courage, MD       rosuvastatin (CRESTOR) tablet 10 mg  10 mg Oral Daily Emokpae, Courage, MD       sodium chloride flush (NS) 0.9 % injection 3 mL  3 mL Intravenous Q12H Emokpae, Courage, MD   3 mL at 11/27/20 2052   sodium chloride flush (NS) 0.9 % injection 3 mL  3 mL Intravenous Q12H Emokpae, Courage, MD   3 mL at 11/27/20 2051   sodium chloride flush (NS) 0.9 % injection 3 mL  3 mL Intravenous PRN Roxan Hockey, MD       traZODone (DESYREL) tablet 50 mg  50 mg Oral QHS PRN Roxan Hockey, MD        Allergies as of 11/27/2020 - Review Complete 11/27/2020  Allergen Reaction Noted   Latex Rash 10/04/2011    Family History  Problem Relation Age of Onset   Breast cancer Mother    Other Mother        Essential tremor   Stroke Mother    Heart disease Father        Pacemaker   Heart attack Maternal Grandfather    Colon cancer Neg Hx    Liver disease Neg Hx    Prostate cancer Neg Hx    Bladder Cancer Neg Hx    Kidney cancer Neg Hx     Social History   Socioeconomic History   Marital status: Married    Spouse name: Not on file   Number of children: 3   Years of education: Not on file   Highest education level: Not on file   Occupational History    Employer: RETIRED   Occupation: Retired- Radiation protection practitioner and farming  Tobacco Use   Smoking status: Former    Packs/day: 0.50    Types: Cigarettes    Quit date: 1992    Years since quitting: 30.8   Smokeless tobacco: Never  Vaping Use   Vaping Use: Never used  Substance and Sexual Activity   Alcohol use: No   Drug use: No   Sexual activity: Not Currently  Other Topics Concern   Not on file  Social History Narrative   Married for 68 years.Retired.No longer in Assisted Living, he came home after wife went to memory care. [facility] for memory care as of Dec 2021.   Social Determinants of Health   Financial Resource Strain: Not on file  Food Insecurity: Not on file  Transportation Needs: Not on file  Physical Activity: Not on file  Stress: Not on file  Social Connections: Not on file  Intimate Partner Violence: Not on file    Review of Systems: Gen: see HPI CV: see HPI Resp: Denies shortness of breath with rest, cough, wheezing GI: see HPI GU : chronic indwelling foley MS: Denies joint pain,swelling, cramping Derm: Denies rash, itching, dry skin Psych: Denies depression, anxiety,confusion, or memory loss Heme: see HPI  Physical Exam: Vital signs in last 24 hours: Temp:  [97.7 F (36.5 C)-98.8 F (37.1 C)] 98.5 F (36.9 C) (11/07 0527) Pulse Rate:  [58-102] 102 (11/07 0527) Resp:  [15-20] 19 (11/07 0527) BP: (93-131)/(43-89) 130/89 (11/07 0527) SpO2:  [95 %-100 %] 99 % (11/07 0527) Weight:  [104.3 kg] 104.3 kg (11/06 0832) Last BM Date: 11/27/20 General:   Alert,  pale, well-developed, no distress.  Head:  Normocephalic and atraumatic. Eyes:  Sclera clear, no icterus.   Ears:  Normal auditory acuity. Lungs:  Clear throughout to auscultation.    Heart:  S1 S2 present, query systolic murmur Abdomen:  Soft, nontender and nondistended. No masses, hepatosplenomegaly or hernias noted. Normal bowel sounds, without  guarding, and without rebound.   Rectal:  Deferred until time of colonoscopy.   Msk:  Symmetrical without gross deformities. Normal posture. Pulses:  Normal pulses noted. Extremities:  Without  edema. Neurologic:  Alert and  oriented x4 Psych:  Alert and cooperative. Normal mood and affect.  Intake/Output from previous day: 11/06 0701 - 11/07 0700 In: 1046.9 [I.V.:17.8; Blood:232; IV Piggyback:797.1] Out: -  Intake/Output this shift: No intake/output data recorded.  Lab Results: Recent Labs    11/27/20 0850 11/27/20 0911 11/27/20 1957 11/27/20 2236 11/28/20 0014 11/28/20 0610  WBC 3.2*  --  3.9*  --   --  3.5*  HGB 8.5*   < > 7.6* 6.9* 7.0* 7.1*  HCT 26.7*   < > 23.3* 21.1* 21.1* 21.7*  PLT 155  --  129*  --   --  130*   < > = values in this interval not displayed.   BMET Recent Labs    11/27/20 0850 11/27/20 0911 11/27/20 1255 11/28/20 0610  NA 138 139 140 141  K 3.7 3.6 3.6 3.6  CL 104 101 102 107  CO2 28  --   --  28  GLUCOSE 99 95 86 114*  BUN 29* 27* 28* 26*  CREATININE 0.86 0.90 1.00 0.88  CALCIUM 8.5*  --   --  8.1*   LFT Recent Labs    11/27/20 0850  PROT 5.6*  ALBUMIN 3.2*  AST 18  ALT 12  ALKPHOS 39  BILITOT 0.6   PT/INR Recent Labs    11/27/20 1957 11/28/20 0610  LABPROT 20.6* 21.5*  INR 1.8* 1.9*     Studies/Results: DG Chest 1 View  Result Date: 11/27/2020 CLINICAL DATA:  Dyspnea.  Dizziness.  Weakness. EXAM: CHEST  1 VIEW COMPARISON:  07/28/2019 FINDINGS: The heart size and mediastinal contours are within normal limits. Aortic atherosclerotic calcification noted. No evidence of pulmonary infiltrate or pleural effusion. A nodular opacity is also seen in the left lower lung adjacent to the left heart border, which was not seen on previous study. IMPRESSION: Nodular opacity in left lower lung. Recommend chest CT without contrast to exclude pulmonary neoplasm. Electronically Signed   By: Marlaine Hind M.D.   On: 11/27/2020 14:00   CT  CHEST WO CONTRAST  Result Date: 11/27/2020 CLINICAL DATA:  Possible mass on recent chest x-ray EXAM: CT CHEST WITHOUT CONTRAST TECHNIQUE: Multidetector CT imaging of the chest was performed following the standard protocol without IV contrast. COMPARISON:  Chest x-ray from earlier in the same day, CT from 06/06/2020 FINDINGS: Cardiovascular: Somewhat limited due to lack of IV contrast. Atherosclerotic calcifications are noted. No aneurysmal dilatation is seen. Heart is mildly enlarged in size with diffuse coronary calcifications identified. Mediastinum/Nodes: Thoracic inlet is within normal limits. No sizable hilar or mediastinal adenopathy is noted. The esophagus as visualized is within normal limits. Lungs/Pleura: Lungs are well aerated bilaterally. Right lung is clear. No focal infiltrate or sizable effusion is noted. Left lung demonstrates a focal fluid attenuation lesion along the left pericardial border which has decreased in size when compared with the prior exam. It previously measured 3.3 cm in greatest transverse dimension and now measures  19 mm in greatest dimension. This likely represents a resolving pericardial cyst or small amount of fluid within the fissure. Given its decrease in size over the past 6 months it is felt to be benign in etiology. No other focal abnormality is noted. Upper Abdomen: As described in recent CT of the abdomen and pelvis on the same day. Musculoskeletal: Degenerative changes of the thoracic spine are seen. IMPRESSION: Decreasing fluid attenuation lesion along the left cardiac border when compared with the prior exam. This may represent a shrinking pericardial cyst or fluid within the fissure on the left. It is felt to be benign in etiology given its decrease in size. No other focal abnormality is noted. Aortic Atherosclerosis (ICD10-I70.0). Electronically Signed   By: Inez Catalina M.D.   On: 11/27/2020 21:21   CT ANGIO GI BLEED  Result Date: 11/27/2020 CLINICAL DATA:   Rectal bleeding for 1 day EXAM: CTA ABDOMEN AND PELVIS WITHOUT AND WITH CONTRAST TECHNIQUE: Multidetector CT imaging of the abdomen and pelvis was performed using the standard protocol during bolus administration of intravenous contrast. Multiplanar reconstructed images and MIPs were obtained and reviewed to evaluate the vascular anatomy. CONTRAST:  165mL OMNIPAQUE IOHEXOL 350 MG/ML SOLN COMPARISON:  06/06/2020 FINDINGS: VASCULAR Aorta: Atherosclerotic calcifications are noted. Fusiform dilatation of the infrarenal aorta is noted to 3.9 cm. This is slightly increased when compared with the prior exam at which time it measured 3.8 cm. No evidence of dissection is noted. Tapering at the aortic bifurcation is noted. Celiac: Patent without evidence of aneurysm, dissection, vasculitis or significant stenosis. SMA: Atherosclerotic calcifications are noted although no significant stenosis is seen. Renals: Dual renal arteries are noted on the right. Single renal artery is noted on the left. Heavy calcifications of the left renal artery is seen with only mild stenosis. Mild narrowing at the origin of the right main renal artery is noted as well. IMA: Patent without evidence of aneurysm, dissection, vasculitis or significant stenosis. Inflow: Iliacs are heavily calcified although no aneurysmal dilatation or dissection is seen. Proximal Outflow: Within normal limits. Veins: No specific venous abnormality is noted. Review of the MIP images confirms the above findings. NON-VASCULAR Lower chest: Lung bases are free of acute infiltrate or sizable effusion. There is a rounded fluid attenuation lesion identified adjacent to the left pericardium which has decreased in size significantly when compared with the prior CT examination at which time it measured approximately 3.4 cm. It now measures 2.0 cm in greatest dimension. Hepatobiliary: Previously seen partially calcified lesion within the dome of the right lobe of the liver  anteriorly is again identified and stable in appearance. A similar lesion is noted in the anterior aspect of the lateral segment of the left lobe of the liver also stable in appearance from the prior study. Gallbladder is within normal limits. No other hepatic abnormality is seen. Pancreas: Unremarkable. No pancreatic ductal dilatation or surrounding inflammatory changes. Spleen: Normal in size without focal abnormality. Adrenals/Urinary Tract: Adrenal glands are within normal limits bilaterally. Kidneys are well visualized bilaterally and again demonstrate multiple bilateral renal cysts stable in appearance from the prior exam. Scattered vascular calcifications are noted. No definitive renal calculi or obstructive changes are seen. The bladder is decompressed by Foley catheter. Stomach/Bowel: Scattered diverticular change of the colon is noted. No findings of diverticulitis are seen. No obstructive or inflammatory changes are noted. The appendix is within normal limits. No focal pooling of contrast material is identified to suggest active GI hemorrhage. No small bowel or  gastric abnormality is noted. Lymphatic: No significant lymphadenopathy is seen. Reproductive: Prostate is enlarged indenting upon the inferior aspect of the bladder. Other: No abdominal wall hernia or abnormality. No abdominopelvic ascites. Musculoskeletal: Degenerative changes of lumbar spine are noted. 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 This is slightly increased in size when compared with the prior exam at which time it measured 3.8 cm. Recommend follow-up every 2 years. Reference: J Am Coll Radiol 7915;05:697-948. No focal pooling of contrast material is identified to suggest active GI hemorrhage. Scattered atherosclerotic changes are noted as described. NON-VASCULAR Diverticular change without diverticulitis. Stable renal cysts when compare with the prior exam. Stable hepatic cystic lesions when  compared with the prior CT. Calcifications are seen likely related to prior hemorrhage. Rounded fluid collection adjacent to the pericardium likely representing a small pericardial cyst or trapped fluid within the fissure. This is decreased in size from the most recent exam from May of 2022. Electronically Signed   By: Inez Catalina M.D.   On: 11/27/2020 21:15    Impression: Tristan Lopez is a very pleasant 87 y.o. year old male presenting this admission with symptomatic anemia due to acute blood loss in setting of GI bleeding on Coumadin, with INR 2.7 on admission. Presenting Hgb 8.5, down to 6.9 and received 1 unit PRBCs with additional 2 units ordered today. INR now 1.9, receiving 2 units FFP yesterday. CTA negative for active bleeding. Last colonoscopy in 2013 with pancolonic diverticulosis and tubular adenoma. EGD also in 2013 with erosive reflux esophatitis and possible area of healing ulceration s/p biopsy. H.pylori negative.    Interestingly, he notes initially rectal bleeding was dark/black, changing to red over the past few days. Unable to rule out a rapid transit UGI bleed in setting of anticoagulation. He is currently taking Prednisone 5 mg daily but remains on Protonix BID and denies any other consistent use of NSAIDs. Concern for diverticular bleeding as well in light of known pancolonic diverticulosis. Unable to exclude occult lesion.   Agree with additional 2 units PRBCs today. We will also give Vit K 10 mg IV X 1 today. Will need to prep for colonoscopy tomorrow with EGD as appropriate.Discussed at length with son and patient, who both stated understanding.    Plan: Clear liquids today, NPO after midnight except sips with meds Vit K IV X 1 now Agree with additional 2 units PRBCs Continue to follow H/H Colonoscopy +/- EGD on 11/8 by Dr. Jenetta Downer Coumadin on hold Continue PPI IV BID   Annitta Needs, PhD, ANP-BC [facility]     LOS: 0 days    11/28/2020, 8:07  AM

## 2020-11-29 ENCOUNTER — Encounter (HOSPITAL_COMMUNITY)
Admission: EM | Disposition: A | Discharge status: Home / Self Care | Payer: Self-pay | Source: Home / Self Care | Attending: Family Medicine

## 2020-11-29 ENCOUNTER — Inpatient Hospital Stay (HOSPITAL_COMMUNITY): Payer: Medicare PPO | Admitting: Anesthesiology

## 2020-11-29 ENCOUNTER — Encounter (HOSPITAL_COMMUNITY): Payer: Self-pay | Admitting: Family Medicine

## 2020-11-29 DIAGNOSIS — K317 Polyp of stomach and duodenum: Secondary | ICD-10-CM

## 2020-11-29 DIAGNOSIS — D122 Benign neoplasm of ascending colon: Secondary | ICD-10-CM

## 2020-11-29 DIAGNOSIS — K5731 Diverticulosis of large intestine without perforation or abscess with bleeding: Principal | ICD-10-CM

## 2020-11-29 DIAGNOSIS — K449 Diaphragmatic hernia without obstruction or gangrene: Secondary | ICD-10-CM

## 2020-11-29 DIAGNOSIS — K648 Other hemorrhoids: Secondary | ICD-10-CM

## 2020-11-29 HISTORY — PX: ESOPHAGOGASTRODUODENOSCOPY (EGD) WITH PROPOFOL: SHX5813

## 2020-11-29 HISTORY — PX: COLONOSCOPY WITH PROPOFOL: SHX5780

## 2020-11-29 LAB — RENAL FUNCTION PANEL
Albumin: 2.9 g/dL — ABNORMAL LOW (ref 3.5–5.0)
Anion gap: 4 — ABNORMAL LOW (ref 5–15)
BUN: 21 mg/dL (ref 8–23)
CO2: 28 mmol/L (ref 22–32)
Calcium: 8 mg/dL — ABNORMAL LOW (ref 8.9–10.3)
Chloride: 106 mmol/L (ref 98–111)
Creatinine, Ser: 0.84 mg/dL (ref 0.61–1.24)
GFR, Estimated: >60 mL/min (ref >60)
Glucose, Bld: 103 mg/dL — ABNORMAL HIGH (ref 70–99)
Phosphorus: 2.5 mg/dL (ref 2.5–4.6)
Potassium: 3.2 mmol/L — ABNORMAL LOW (ref 3.5–5.1)
Sodium: 138 mmol/L (ref 135–145)

## 2020-11-29 LAB — TYPE AND SCREEN
ABO/RH(D): AB POS
Antibody Screen: NEGATIVE
Unit division: 0
Unit division: 0
Unit division: 0

## 2020-11-29 LAB — CBC
HCT: 23.8 % — ABNORMAL LOW (ref 39.0–52.0)
Hemoglobin: 7.8 g/dL — ABNORMAL LOW (ref 13.0–17.0)
MCH: 31 pg (ref 26.0–34.0)
MCHC: 32.8 g/dL (ref 30.0–36.0)
MCV: 94.4 fL (ref 80.0–100.0)
Platelets: 124 10*3/uL — ABNORMAL LOW (ref 150–400)
RBC: 2.52 MIL/uL — ABNORMAL LOW (ref 4.22–5.81)
RDW: 16.3 % — ABNORMAL HIGH (ref 11.5–15.5)
WBC: 3.8 10*3/uL — ABNORMAL LOW (ref 4.0–10.5)
nRBC: 0 % (ref 0.0–0.2)

## 2020-11-29 LAB — GLUCOSE, CAPILLARY
Glucose-Capillary: 85 mg/dL (ref 70–99)
Glucose-Capillary: 86 mg/dL (ref 70–99)

## 2020-11-29 LAB — BPAM RBC
Blood Product Expiration Date: 202211272359
Blood Product Expiration Date: 202211272359
Blood Product Expiration Date: 202211282359
ISSUE DATE / TIME: 202211061600
ISSUE DATE / TIME: 202211070946
ISSUE DATE / TIME: 202211071308
Unit Type and Rh: 600
Unit Type and Rh: 600
Unit Type and Rh: 6200

## 2020-11-29 LAB — PREPARE RBC (CROSSMATCH)

## 2020-11-29 LAB — PROTIME-INR
INR: 1.2 (ref 0.8–1.2)
Prothrombin Time: 15.5 seconds — ABNORMAL HIGH (ref 11.4–15.2)

## 2020-11-29 SURGERY — COLONOSCOPY WITH PROPOFOL
Anesthesia: General

## 2020-11-29 MED ORDER — LACTATED RINGERS IV SOLN: INTRAVENOUS | Status: DC

## 2020-11-29 MED ORDER — FUROSEMIDE 10 MG/ML IJ SOLN
40.0000 mg | Freq: Once | INTRAMUSCULAR | Status: AC
  Administered 2020-11-29: 40 mg via INTRAVENOUS
  Filled 2020-11-29: qty 4

## 2020-11-29 MED ORDER — LIDOCAINE HCL (CARDIAC) PF 50 MG/5ML IV SOSY
PREFILLED_SYRINGE | INTRAVENOUS | Status: DC | PRN
  Administered 2020-11-29: 50 mg via INTRAVENOUS

## 2020-11-29 MED ORDER — SODIUM CHLORIDE 0.9% IV SOLUTION: Freq: Once | INTRAVENOUS | Status: AC

## 2020-11-29 MED ORDER — DM-GUAIFENESIN ER 30-600 MG PO TB12
1.0000 | ORAL_TABLET | Freq: Two times a day (BID) | ORAL | Status: DC
  Administered 2020-11-29 – 2020-12-01 (×4): 1 via ORAL
  Filled 2020-11-29 (×4): qty 1

## 2020-11-29 MED ORDER — SODIUM CHLORIDE (PF) 0.9 % IJ SOLN
PREFILLED_SYRINGE | INTRAMUSCULAR | Status: DC | PRN
  Administered 2020-11-29: 5 mL

## 2020-11-29 MED ORDER — POTASSIUM CHLORIDE CRYS ER 20 MEQ PO TBCR
40.0000 meq | EXTENDED_RELEASE_TABLET | Freq: Once | ORAL | Status: AC
  Administered 2020-11-29: 40 meq via ORAL
  Filled 2020-11-29: qty 2

## 2020-11-29 MED ORDER — PROPOFOL 10 MG/ML IV BOLUS
INTRAVENOUS | Status: DC | PRN
  Administered 2020-11-29 (×3): 50 mg via INTRAVENOUS
  Administered 2020-11-29: 100 mg via INTRAVENOUS
  Administered 2020-11-29 (×3): 50 mg via INTRAVENOUS
  Administered 2020-11-29: 100 mg via INTRAVENOUS
  Administered 2020-11-29: 50 mg via INTRAVENOUS

## 2020-11-29 MED ORDER — POTASSIUM CHLORIDE 10 MEQ/100ML IV SOLN
10.0000 meq | INTRAVENOUS | Status: DC
  Administered 2020-11-29 (×4): 10 meq via INTRAVENOUS
  Filled 2020-11-29 (×4): qty 100

## 2020-11-29 MED ORDER — PROPOFOL 500 MG/50ML IV EMUL
INTRAVENOUS | Status: DC | PRN
  Administered 2020-11-29 (×2): 75 ug/kg/min via INTRAVENOUS

## 2020-11-29 MED ORDER — POTASSIUM CHLORIDE CRYS ER 20 MEQ PO TBCR
40.0000 meq | EXTENDED_RELEASE_TABLET | ORAL | Status: DC
  Filled 2020-11-29: qty 2

## 2020-11-29 MED ORDER — SODIUM CHLORIDE 0.9 % IV SOLN: INTRAVENOUS | Status: DC

## 2020-11-29 NOTE — Progress Notes (Signed)
We will proceed with EGD and colonoscopy as scheduled.  I thoroughly discussed with the patient his procedure, including the risks involved. Patient understands what the procedure involves including the benefits and any risks. Patient understands alternatives to the proposed procedure. Risks including (but not limited to) bleeding, tearing of the lining (perforation), rupture of adjacent organs, problems with heart and lung function, infection, and medication reactions. A small percentage of complications may require surgery, hospitalization, repeat endoscopic procedure, and/or transfusion.  Patient understood and agreed. ? ?Madasyn Heath Castaneda, MD ?Gastroenterology and Hepatology ?[facility] ? ?

## 2020-11-29 NOTE — Brief Op Note (Signed)
11/27/2020 - 11/29/2020  12:03 PM  PATIENT:  Tristan Lopez  87 y.o. male  PRE-OPERATIVE DIAGNOSIS:  GI bleeding, acute blood loss anemia  POST-OPERATIVE DIAGNOSIS:  hiatla hernia, gastric polyps/diverticulosis, ascending colon polyps times three, diverticular bleed at transverse colon, hemorrhoids   PROCEDURE:  Procedure(s): COLONOSCOPY WITH PROPOFOL (N/A) ESOPHAGOGASTRODUODENOSCOPY (EGD) WITH PROPOFOL (N/A)  SURGEON:  Surgeon(s) and Role:    * Harvel Quale, MD - Primary  Patient underwent EGD and colonoscopy under propofol sedation.  Tolerated the procedure adequately.  Esophagogastroduodenospy showed presence of hiatal hernia of 3 cm but no alterations or bleeding were found in the esophagus, stomach or duodenum.  Had a few gastric polyps which are likely related to fundic gland polyps.  Colonoscopy had an adequate prep for an inpatient colonoscopy.  The scope was advanced up to the terminal ileum (20 cm from the ileocecal valve). The terminal ileum contained red blood in the most distal area, but the scope was advanced up to 20 cm proximal from the IC valve. There was fresh blood in the distal 10 cm but the proximal 10 cm had only bile. Upon lavage of the blood no lesion or active bleeding could be identified. Three sessile polyps were found in the proximal ascending colon.  The polyps were 4 to 5 mm in size. These were not resected given anticoagulation status and recent bleeding. Multiple small and large-mouthed diverticula were found in the sigmoid colon, descending colon and ascending colon.  There was active bleeding coming from the a diverticular opening in the proximal ascending colon, which had a clot attached to it.  Area was successfully injected with 5 mL of a 1:10,000 solution of epinephrine for hemostasis.  For hemostasis, two hemostatic clips were successfully placed at the diverticular site (one clip was a Resolution 360 17 mm clip).  There was no bleeding at the  end of the procedure. There was presence of hematin throughout the colon but no more fresh blood or active oozing was seen. Internal hemorrhoids.  RECOMMENDATIONS - Return patient to hospital ward for ongoing care.  - Clear liquid diet.  - Repeat H/H in the AM. - If overt large fresh rectal bleeding or hemodynamic instability, please reach IR for embolization - clips will help for marking for marking purposes. - Hold coumadin for now. - Can discuss in GI clinic benefit vs risks of colonoscopy with polypectomy  Maylon Peppers, MD Gastroenterology and Hepatology [facility]

## 2020-11-29 NOTE — Transfer of Care (Signed)
Immediate Anesthesia Transfer of Care Note  Patient: Tristan Lopez  Procedure(s) Performed: COLONOSCOPY WITH PROPOFOL ESOPHAGOGASTRODUODENOSCOPY (EGD) WITH PROPOFOL  Patient Location: PACU  Anesthesia Type:General  Level of Consciousness: sedated  Airway & Oxygen Therapy: Patient Spontanous Breathing  Post-op Assessment: Report given to RN and Post -op Vital signs reviewed and stable  Post vital signs: Reviewed and stable  Last Vitals:  Vitals Value Taken Time  BP    Temp    Pulse 91 11/29/20 1205  Resp 16 11/29/20 1205  SpO2 100 % 11/29/20 1205  Vitals shown include unvalidated device data.  Last Pain:  Vitals:   11/29/20 1100  TempSrc:   PainSc: 0-No pain         Complications: No notable events documented.

## 2020-11-29 NOTE — Op Note (Signed)
[facility] Patient Name: Tristan Lopez Procedure Date: 11/29/2020 10:45 AM MRN: [id number] Date of Birth: 02-17-1930 Attending MD: Maylon Peppers ,  CSN: [id number] Age: 87 Admit Type: Inpatient Procedure:                Upper GI endoscopy Indications:              Acute post hemorrhagic anemia, Hematochezia Providers:                Maylon Peppers, Charlsie Quest. Theda Sers RN, RN, Raphael Gibney, Technician Referring MD:              Medicines:                Monitored Anesthesia Care Complications:            No immediate complications. Estimated Blood Loss:     Estimated blood loss: none. Procedure:                Pre-Anesthesia Assessment:                           - Prior to the procedure, a History and Physical                            was performed, and patient medications, allergies                            and sensitivities were reviewed. The patient's                            tolerance of previous anesthesia was reviewed.                           - The risks and benefits of the procedure and the                            sedation options and risks were discussed with the                            patient. All questions were answered and informed                            consent was obtained.                           - ASA Grade Assessment: III - A patient with severe                            systemic disease.                           After obtaining informed consent, the endoscope was                            passed under direct vision. Throughout the  procedure, the patient's blood pressure, pulse, and                            oxygen saturations were monitored continuously. The                            [id number]-H190 ([id number] scope was introduced through the                            mouth, and advanced to the second part of duodenum.                            The upper GI endoscopy was accomplished  without                            difficulty. The patient tolerated the procedure                            well. Scope In: 11:03:41 AM Scope Out: 11:07:15 AM Total Procedure Duration: 0 hours 3 minutes 34 seconds  Findings:      A 3 cm hiatal hernia was present.      A few small sessile polyps with no bleeding were found in the gastric       body. Upon careful inspection, no presence of hematin or active bleeding       was present.      The examined duodenum was normal. Upon careful inspection, no presence       of hematin or active bleeding was present. Impression:               - 3 cm hiatal hernia.                           - A few gastric polyps.                           - Normal examined duodenum.                           - No specimens collected. Moderate Sedation:      Per Anesthesia Care Recommendation:           - Return patient to hospital ward for ongoing care.                           - Clear liquid diet. Procedure Code(s):        --- Professional ---                           [phone], Esophagogastroduodenoscopy, flexible,                            transoral; diagnostic, including collection of                            specimen(s) by brushing or washing, when performed                            (  separate procedure) Diagnosis Code(s):        --- Professional ---                           K44.9, Diaphragmatic hernia without obstruction or                            gangrene                           K31.7, Polyp of stomach and duodenum                           D62, Acute posthemorrhagic anemia                           K92.1, Melena (includes Hematochezia) CPT copyright 2019 American Medical Association. All rights reserved. The codes documented in this report are preliminary and upon coder review may  be revised to meet current compliance requirements. Maylon Peppers, MD Maylon Peppers,  11/29/2020 12:04:04 PM This report has been signed  electronically. Number of Addenda: 0

## 2020-11-29 NOTE — Anesthesia Preprocedure Evaluation (Addendum)
Anesthesia Evaluation  Patient identified by MRN, date of birth, ID band Patient awake    Reviewed: Allergy & Precautions, NPO status , Patient's Chart, lab work & pertinent test results  Airway Mallampati: II  TM Distance: >3 FB Neck ROM: Full   Comment: Unilateral vocal cord paralysis  Dental  (+) Dental Advisory Given, Chipped, Missing,    Pulmonary sleep apnea and Continuous Positive Airway Pressure Ventilation , COPD, former smoker,    Pulmonary exam normal breath sounds clear to auscultation       Cardiovascular Exercise Tolerance: Good hypertension, Pt. on medications + Peripheral Vascular Disease (AAA)  Normal cardiovascular exam+ dysrhythmias Atrial Fibrillation  Rhythm:Regular Rate:Normal  27-Nov-2020 08:32:31 [facility]ER ROUTINE RECORD January 17, 1931 (89 yr) Male Caucasian Vent. rate 73 BPM PR interval  QRS duration 82 ms QT/QTcB 391/431 ms P-R-T axes  61 33 Atrial fibrillation Low voltage, precordial leads since last tracing no significant change Confirmed by Malvin Johns [phone]) on 11/28/2020 9:01:35 AM   Neuro/Psych negative neurological ROS  negative psych ROS   GI/Hepatic hiatal hernia, GERD  Medicated,GI bleed   Endo/Other    Renal/GU Renal disease     Musculoskeletal negative musculoskeletal ROS (+)   Abdominal   Peds  Hematology  (+) Blood dyscrasia, anemia ,   Anesthesia Other Findings IMPRESSION: VASCULAR  1. Fusiform infrarenal abdominal aortic aneurysm measuring up to 3.8 cm, increased from 3.5 cm in 2015. Aortic aneurysm NOS (ICD10-I71.9). Recommend follow-up ultrasound every 2 years. This recommendation follows ACR consensus guidelines: White Paper of the ACR Incidental Findings Committee II on Vascular Findings. J Am Coll Radiol 2013; 10:789-794.  NON-VASCULAR  1. Similar size of previously visualized partially exophytic bilateral hepatic cystic masses,  now with heterogeneous internal attenuation and increased peripheral calcifications, likely secondary to interval hemorrhagic changes. 2. Similar appearing multifocal simple renal cysts, the largest arising from the right superior pole without evidence of hemorrhagic component. 3. Pan-colonic diverticulosis.  Ruthann Cancer, MD  [facility]   Reproductive/Obstetrics negative OB ROS                            Anesthesia Physical Anesthesia Plan  ASA: 3  Anesthesia Plan: General   Post-op Pain Management:    Induction: Intravenous  PONV Risk Score and Plan: TIVA  Airway Management Planned: Nasal Cannula and Natural Airway  Additional Equipment:   Intra-op Plan:   Post-operative Plan: Possible Post-op intubation/ventilation  Informed Consent: I have reviewed the patients History and Physical, chart, labs and discussed the procedure including the risks, benefits and alternatives for the proposed anesthesia with the patient or authorized representative who has indicated his/her understanding and acceptance.    Discussed DNR with patient and Suspend DNR.   Dental advisory given  Plan Discussed with: CRNA and Surgeon  Anesthesia Plan Comments:         Anesthesia Quick Evaluation

## 2020-11-29 NOTE — Op Note (Signed)
[facility] Patient Name: Tristan Lopez Procedure Date: 11/29/2020 11:12 AM MRN: [id number] Date of Birth: 26-May-1930 Attending MD: Maylon Peppers ,  CSN: [id number] Age: 87 Admit Type: Inpatient Procedure:                Colonoscopy Indications:              Rectal bleeding Providers:                Maylon Peppers, Charlsie Quest. Theda Sers RN, RN, Raphael Gibney, Technician Referring MD:              Medicines:                Monitored Anesthesia Care Complications:            No immediate complications. Estimated Blood Loss:     Estimated blood loss: none. Procedure:                Pre-Anesthesia Assessment:                           - Prior to the procedure, a History and Physical                            was performed, and patient medications, allergies                            and sensitivities were reviewed. The patient's                            tolerance of previous anesthesia was reviewed.                           - The risks and benefits of the procedure and the                            sedation options and risks were discussed with the                            patient. All questions were answered and informed                            consent was obtained.                           - ASA Grade Assessment: III - A patient with severe                            systemic disease.                           After obtaining informed consent, the colonoscope                            was passed under direct vision. Throughout the  procedure, the patient's blood pressure, pulse, and                            oxygen saturations were monitored continuously. The                            [id number]-HQ190L ([id number] scope was introduced through                            the anus and advanced to the the terminal ileum.                            The colonoscopy was performed without difficulty.                             The patient tolerated the procedure well. The                            quality of the bowel preparation was adequate to                            identify polyps 6 mm and larger in size. Scope In: 11:13:55 AM Scope Out: 11:59:24 AM Scope Withdrawal Time: 0 hours 36 minutes 9 seconds  Total Procedure Duration: 0 hours 45 minutes 29 seconds  Findings:      The perianal and digital rectal examinations were normal.      The terminal ileum contained red blood in the most distal area, but the       scope was advanced up to 20 cm proximal from the IC valve. There was       fresh blood in the distal 10 cm but the proximal 10 cm had only bile.       Upon lavage of the blood no lesion or active bleeding could be       identified..      Three sessile polyps were found in the proximal ascending colon. The       polyps were 4 to 5 mm in size. These were not resected given       anticoagulation status and recent bleeding.      Multiple small and large-mouthed diverticula were found in the sigmoid       colon, descending colon and ascending colon. There was active bleeding       coming from the a diverticular opening in the proximal ascending colon,       which had a clot attached to it. Area was successfully injected with 5       mL of a 1:10,000 solution of epinephrine for hemostasis. For hemostasis,       two hemostatic clips were successfully placed at the diverticular site       (one clip was a Resolution 360 17 mm clip). There was no bleeding at the       end of the procedure. There was presence of hematin throughout the colon       but no more fresh blood or active oozing was seen.      Non-bleeding internal hemorrhoids were found during retroflexion. The       hemorrhoids were small. Impression:               -  Blood in the terminal ileum.                           - Three 4 to 5 mm polyps in the proximal ascending                            colon.                           -  Diverticulosis in the sigmoid colon, in the                            descending colon and in the ascending colon. There                            was active bleeding coming from a diverticular                            opening in the proximal AC. Injected. Clips were                            placed.                           - Non-bleeding internal hemorrhoids.                           - No specimens collected. Moderate Sedation:      Per Anesthesia Care Recommendation:           - Return patient to hospital ward for ongoing care.                           - Clear liquid diet.                           - Repeat H/H in the AM.                           - If overt large fresh rectal bleeding or                            hemodynamic instability, please reach IR for                            embolization - clips will help for marking for                            marking purposes.                           - Hold coumadin for now.                           - Can discuss in GI clinic benefit vs risks of  colonoscopy with polypectomy Procedure Code(s):        --- Professional ---                           [phone], Colonoscopy, flexible; with control of                            bleeding, any method Diagnosis Code(s):        --- Professional ---                           K92.2, Gastrointestinal hemorrhage, unspecified                           K63.5, Polyp of colon                           K64.8, Other hemorrhoids                           K57.31, Diverticulosis of large intestine without                            perforation or abscess with bleeding                           K62.5, Hemorrhage of anus and rectum CPT copyright 2019 American Medical Association. All rights reserved. The codes documented in this report are preliminary and upon coder review may  be revised to meet current compliance requirements. Maylon Peppers, MD Maylon Peppers,   11/29/2020 12:16:27 PM This report has been signed electronically. Number of Addenda: 0

## 2020-11-29 NOTE — Progress Notes (Signed)
PROGRESS NOTE     Tristan Lopez, is a 87 y.o. male, DOB - 1930-09-30, NID:[id number]  Admit date - 11/27/2020   Admitting Physician Everli Rother Denton Brick, MD  Outpatient Primary MD for the patient is Noreene Larsson, NP  LOS - 1  Chief Complaint  Patient presents with   Rectal Bleeding        Brief Narrative:   87 y.o. male with past medical history relevant for HTN, history of pancolonic diverticulosis, history of reflux esophagitis and gastric ulcer, obesity and OSA, BPH with LUTS requiring chronic indwelling Foley, COPD, chronic atrial fibrillation on chronic anticoagulation with Coumadin admitted on 11/27/2020 with bright red blood per rectum/acute GI bleed with hemoglobin dropping from a baseline around 12 to around 8, in the setting of therapeutic INR of 2.7  Assessment & Plan:   Principal Problem:   Acute GI bleeding Active Problems:   Atrial fibrillation ([place])   Long term (current) use of anticoagulants   GERD (gastroesophageal reflux disease)   Essential hypertension   AAA (abdominal aortic aneurysm) without rupture   COPD (chronic obstructive pulmonary disease) ([place])   ABLA (acute blood loss anemia)   1)Acute GI bleed in the setting of chronic anticoagulation with INR of 2.7 --Review of records shows that Patient had EGD and colonoscopy in October 2013--- at that time colonoscopy showed pancolonic diverticulosis and internal hemorrhoids, EGD showed reflux esophagitis, antral/gastric ulcers and hiatal hernia -Cannot rule out diverticular bleed - Hgb baseline usually around 12, -This admission patient has received 3 units of PRBC and 2 units of FFP, patient also received vitamin K -Hemoglobin drifting down again - we will give additional 2 units PRBC on 11/29/2020 for total of 5 units of PRBC this admission --GI consult appreciated --  -- CTA abdomen and pelvis from 11/27/2020 without active bleeding if bleeding  -Protonix as ordered -No abdominal pain, -No  hematuria -Colonoscopy on 11/29/2020 with diverticular bleeding treated with hemostatic clips   2) chronic atrial fibrillation---- -Continue propanolol for rate control -Coumadin remains on hold Anticoagulation as noted above #1   3)COPD--- stable, bronchodilators as ordered   4)HTN--c/n to hold hold amlodipine and benazepril  -Continue propranolol, - may use IV labetalol when necessary  Every 4 hours for systolic blood pressure over 160 mmhg   5)HLD--continue Crestor   6)BPH with LUTs--- continue indwelling Foley catheter, no hematuria   7)Class 2 Obesity-/OSA -Low calorie diet, portion control and increase physical activity discussed with patient -Body mass index is 33.97 kg/m. -CPAP Qhs    8) abnormal chest x-ray-- Nodular opacity in left lower lung. Recommend chest --CT chest without contrast without acute or concerning findings  Disposition/Need for in-Hospital Stay- patient unable to be discharged at this time due to -acute GI bleed requiring transfusion and reversal of anticoagulation with FFP, transfusion of PRBCs and prep for endoluminal evaluation -Possible discharge home in a couple of days if remains hemostatic   Dispo: The patient is from: Home              Anticipated d/c is to: Home              Anticipated d/c date is: 2 days              Patient currently is not medically stable to d/c. Barriers: Not Clinically Stable-   Procedures:- 11/29/2020 EGD and colonoscopy  Code Status :  -  Code Status: DNR   Family Communication:-  (patient is alert, awake and coherent)  Discussed with son and daughter at bedside  Consults  :  Gi  DVT Prophylaxis  :   - SCDs  SCDs Start: 11/27/20 1901 Place TED hose Start: 11/27/20 1901   Lab Results  Component Value Date   PLT 124 (L) 11/29/2020    Inpatient Medications  Scheduled Meds:  sodium chloride   Intravenous Once   dextromethorphan-guaiFENesin  1 tablet Oral BID   furosemide  40 mg Intravenous Once    multivitamin with minerals  1 tablet Oral Daily   pantoprazole (PROTONIX) IV  40 mg Intravenous Q12H   propranolol  20 mg Oral Daily   rosuvastatin  10 mg Oral Daily   sodium chloride flush  3 mL Intravenous Q12H   sodium chloride flush  3 mL Intravenous Q12H   Continuous Infusions:  sodium chloride     PRN Meds:.sodium chloride, acetaminophen **OR** acetaminophen, albuterol, bisacodyl, labetalol, ondansetron **OR** ondansetron (ZOFRAN) IV, polyethylene glycol, sodium chloride flush, traZODone   Anti-infectives (From admission, onward)    None        Subjective: Tristan Lopez today has no fevers, no emesis,  No chest pain,   -Patient's daughter at bedside, questions answered -Colon prep with bloody stools -Tolerated colonoscopy and EGD well   Objective: Vitals:   11/29/20 1205 11/29/20 1215 11/29/20 1230 11/29/20 1300  BP: (!) 154/81 (!) 145/81 135/88 (!) 151/80  Pulse: 91 100 87 71  Resp: 16 10 (!) 21   Temp: 98 F (36.7 C)     TempSrc:      SpO2: 100% 100% 100%   Weight:      Height:        Intake/Output Summary (Last 24 hours) at 11/29/2020 1830 Last data filed at 11/29/2020 1700 Gross per 24 hour  Intake 480 ml  Output --  Net 480 ml   Filed Weights   11/27/20 0832 11/29/20 1012  Weight: 104.3 kg 102.1 kg    Physical Exam  Gen:- Awake Alert,  in no apparent distress  HEENT:- [place].AT, No sclera icterus Ears--HOH Neck-Supple Neck,No JVD,.  Lungs-  CTAB , fair symmetrical air movement CV- S1, S2 normal, irregularly irregular Abd-  +ve B.Sounds, Abd Soft, No tenderness,    Extremity/Skin:- No  edema, pedal pulses present Psych-affect is appropriate, oriented x3 Neuro-no new focal deficits, no tremors GU-Foley catheter noted, no hematuria  Data Reviewed: I have personally reviewed following labs and imaging studies  CBC: Recent Labs  Lab 11/27/20 0850 11/27/20 0911 11/27/20 1957 11/27/20 2236 11/28/20 0014 11/28/20 0610 11/28/20 2130  11/29/20 0509  WBC 3.2*  --  3.9*  --   --  3.5*  --  3.8*  NEUTROABS 1.6*  --   --   --   --   --   --   --   HGB 8.5*   < > 7.6* 6.9* 7.0* 7.1* 8.1* 7.8*  HCT 26.7*   < > 23.3* 21.1* 21.1* 21.7* 24.3* 23.8*  MCV 100.4*  --  97.1  --   --  97.3  --  94.4  PLT 155  --  129*  --   --  130*  --  124*   < > = values in this interval not displayed.   Basic Metabolic Panel: Recent Labs  Lab 11/27/20 0850 11/27/20 0911 11/27/20 1255 11/28/20 0610 11/29/20 0509  NA 138 139 140 141 138  K 3.7 3.6 3.6 3.6 3.2*  CL 104 101 102 107 106  CO2 28  --   --  28 28  GLUCOSE 99 95 86 114* 103*  BUN 29* 27* 28* 26* 21  CREATININE 0.86 0.90 1.00 0.88 0.84  CALCIUM 8.5*  --   --  8.1* 8.0*  PHOS  --   --   --   --  2.5   GFR: Estimated Creatinine Clearance: 73 mL/min (by C-G formula based on SCr of 0.84 mg/dL). Liver Function Tests: Recent Labs  Lab 11/27/20 0850 11/29/20 0509  AST 18  --   ALT 12  --   ALKPHOS 39  --   BILITOT 0.6  --   PROT 5.6*  --   ALBUMIN 3.2* 2.9*   No results for input(s): LIPASE, AMYLASE in the last 168 hours. No results for input(s): AMMONIA in the last 168 hours. Coagulation Profile: Recent Labs  Lab 11/27/20 0850 11/27/20 1957 11/28/20 0610 11/29/20 0509  INR 2.7* 1.8* 1.9* 1.2   Cardiac Enzymes: No results for input(s): CKTOTAL, CKMB, CKMBINDEX, TROPONINI in the last 168 hours. BNP (last 3 results) No results for input(s): PROBNP in the last 8760 hours. HbA1C: No results for input(s): HGBA1C in the last 72 hours. CBG: Recent Labs  Lab 11/28/20 1134 11/28/20 1707 11/28/20 2355 11/29/20 0551 11/29/20 1636  GLUCAP 124* 107* 102* 85 86   Lipid Profile: No results for input(s): CHOL, HDL, LDLCALC, TRIG, CHOLHDL, LDLDIRECT in the last 72 hours. Thyroid Function Tests: No results for input(s): TSH, T4TOTAL, FREET4, T3FREE, THYROIDAB in the last 72 hours. Anemia Panel: No results for input(s): VITAMINB12, FOLATE, FERRITIN, TIBC, IRON,  RETICCTPCT in the last 72 hours. Urine analysis:    Component Value Date/Time   COLORURINE YELLOW 06/09/2020 0424   APPEARANCEUR Clear 09/21/2020 1431   LABSPEC 1.009 06/09/2020 0424   PHURINE 6.0 06/09/2020 0424   GLUCOSEU Negative 09/21/2020 1431   HGBUR NEGATIVE 06/09/2020 0424   BILIRUBINUR Negative 09/21/2020 1431   KETONESUR NEGATIVE 06/09/2020 0424   PROTEINUR Negative 09/21/2020 1431   PROTEINUR NEGATIVE 06/09/2020 0424   UROBILINOGEN 0.2 10/04/2011 2218   NITRITE Negative 09/21/2020 1431   NITRITE NEGATIVE 06/09/2020 0424   LEUKOCYTESUR Negative 09/21/2020 1431   LEUKOCYTESUR NEGATIVE 06/09/2020 0424   Sepsis Labs: @LABRCNTIP (procalcitonin:4,lacticidven:4)  Recent Results (from the past 240 hour(s))  Resp Panel by RT-PCR (Flu A&B, Covid) Nasopharyngeal Swab     Status: None   Collection Time: 11/27/20 11:55 AM   Specimen: Nasopharyngeal Swab; Nasopharyngeal(NP) swabs in vial transport medium  Result Value Ref Range Status   SARS Coronavirus 2 by RT PCR NEGATIVE NEGATIVE Final    Comment: (NOTE) SARS-CoV-2 target nucleic acids are NOT DETECTED.  The SARS-CoV-2 RNA is generally detectable in upper respiratory specimens during the acute phase of infection. The lowest concentration of SARS-CoV-2 viral copies this assay can detect is 138 copies/mL. A negative result does not preclude SARS-Cov-2 infection and should not be used as the sole basis for treatment or other patient management decisions. A negative result may occur with  improper specimen collection/handling, submission of specimen other than nasopharyngeal swab, presence of viral mutation(s) within the areas targeted by this assay, and inadequate number of viral copies(<138 copies/mL). A negative result must be combined with clinical observations, patient history, and epidemiological information. The expected result is Negative.  Fact Sheet for Patients:  EntrepreneurPulse.com.au  Fact  Sheet for Healthcare Providers:  IncredibleEmployment.be  This test is no t yet approved or cleared by the [place] FDA and  has been authorized for detection and/or diagnosis of SARS-CoV-2 by FDA under  an Emergency Use Authorization (EUA). This EUA will remain  in effect (meaning this test can be used) for the duration of the COVID-19 declaration under Section 564(b)(1) of the Act, 21 U.S.C.section 360bbb-3(b)(1), unless the authorization is terminated  or revoked sooner.       Influenza A by PCR NEGATIVE NEGATIVE Final   Influenza B by PCR NEGATIVE NEGATIVE Final    Comment: (NOTE) The Xpert Xpress SARS-CoV-2/FLU/RSV plus assay is intended as an aid in the diagnosis of influenza from Nasopharyngeal swab specimens and should not be used as a sole basis for treatment. Nasal washings and aspirates are unacceptable for Xpert Xpress SARS-CoV-2/FLU/RSV testing.  Fact Sheet for Patients: EntrepreneurPulse.com.au  Fact Sheet for Healthcare Providers: IncredibleEmployment.be  This test is not yet approved or cleared by the [place] FDA and has been authorized for detection and/or diagnosis of SARS-CoV-2 by FDA under an Emergency Use Authorization (EUA). This EUA will remain in effect (meaning this test can be used) for the duration of the COVID-19 declaration under Section 564(b)(1) of the Act, 21 U.S.C. section 360bbb-3(b)(1), unless the authorization is terminated or revoked.  Performed at [facility], [address] [place], [place] [address]    Radiology Studies: CT CHEST WO CONTRAST  Result Date: 11/27/2020 CLINICAL DATA:  Possible mass on recent chest x-ray EXAM: CT CHEST WITHOUT CONTRAST TECHNIQUE: Multidetector CT imaging of the chest was performed following the standard protocol without IV contrast. COMPARISON:  Chest x-ray from earlier in the same day, CT from 06/06/2020 FINDINGS: Cardiovascular: Somewhat  limited due to lack of IV contrast. Atherosclerotic calcifications are noted. No aneurysmal dilatation is seen. Heart is mildly enlarged in size with diffuse coronary calcifications identified. Mediastinum/Nodes: Thoracic inlet is within normal limits. No sizable hilar or mediastinal adenopathy is noted. The esophagus as visualized is within normal limits. Lungs/Pleura: Lungs are well aerated bilaterally. Right lung is clear. No focal infiltrate or sizable effusion is noted. Left lung demonstrates a focal fluid attenuation lesion along the left pericardial border which has decreased in size when compared with the prior exam. It previously measured 3.3 cm in greatest transverse dimension and now measures 19 mm in greatest dimension. This likely represents a resolving pericardial cyst or small amount of fluid within the fissure. Given its decrease in size over the past 6 months it is felt to be benign in etiology. No other focal abnormality is noted. Upper Abdomen: As described in recent CT of the abdomen and pelvis on the same day. Musculoskeletal: Degenerative changes of the thoracic spine are seen. IMPRESSION: Decreasing fluid attenuation lesion along the left cardiac border when compared with the prior exam. This may represent a shrinking pericardial cyst or fluid within the fissure on the left. It is felt to be benign in etiology given its decrease in size. No other focal abnormality is noted. Aortic Atherosclerosis (ICD10-I70.0). Electronically Signed   By: Inez Catalina M.D.   On: 11/27/2020 21:21   CT ANGIO GI BLEED  Result Date: 11/27/2020 CLINICAL DATA:  Rectal bleeding for 1 day EXAM: CTA ABDOMEN AND PELVIS WITHOUT AND WITH CONTRAST TECHNIQUE: Multidetector CT imaging of the abdomen and pelvis was performed using the standard protocol during bolus administration of intravenous contrast. Multiplanar reconstructed images and MIPs were obtained and reviewed to evaluate the vascular anatomy. CONTRAST:  136mL  OMNIPAQUE IOHEXOL 350 MG/ML SOLN COMPARISON:  06/06/2020 FINDINGS: VASCULAR Aorta: Atherosclerotic calcifications are noted. Fusiform dilatation of the infrarenal aorta is noted to 3.9 cm. This is slightly  increased when compared with the prior exam at which time it measured 3.8 cm. No evidence of dissection is noted. Tapering at the aortic bifurcation is noted. Celiac: Patent without evidence of aneurysm, dissection, vasculitis or significant stenosis. SMA: Atherosclerotic calcifications are noted although no significant stenosis is seen. Renals: Dual renal arteries are noted on the right. Single renal artery is noted on the left. Heavy calcifications of the left renal artery is seen with only mild stenosis. Mild narrowing at the origin of the right main renal artery is noted as well. IMA: Patent without evidence of aneurysm, dissection, vasculitis or significant stenosis. Inflow: Iliacs are heavily calcified although no aneurysmal dilatation or dissection is seen. Proximal Outflow: Within normal limits. Veins: No specific venous abnormality is noted. Review of the MIP images confirms the above findings. NON-VASCULAR Lower chest: Lung bases are free of acute infiltrate or sizable effusion. There is a rounded fluid attenuation lesion identified adjacent to the left pericardium which has decreased in size significantly when compared with the prior CT examination at which time it measured approximately 3.4 cm. It now measures 2.0 cm in greatest dimension. Hepatobiliary: Previously seen partially calcified lesion within the dome of the right lobe of the liver anteriorly is again identified and stable in appearance. A similar lesion is noted in the anterior aspect of the lateral segment of the left lobe of the liver also stable in appearance from the prior study. Gallbladder is within normal limits. No other hepatic abnormality is seen. Pancreas: Unremarkable. No pancreatic ductal dilatation or surrounding inflammatory  changes. Spleen: Normal in size without focal abnormality. Adrenals/Urinary Tract: Adrenal glands are within normal limits bilaterally. Kidneys are well visualized bilaterally and again demonstrate multiple bilateral renal cysts stable in appearance from the prior exam. Scattered vascular calcifications are noted. No definitive renal calculi or obstructive changes are seen. The bladder is decompressed by Foley catheter. Stomach/Bowel: Scattered diverticular change of the colon is noted. No findings of diverticulitis are seen. No obstructive or inflammatory changes are noted. The appendix is within normal limits. No focal pooling of contrast material is identified to suggest active GI hemorrhage. No small bowel or gastric abnormality is noted. Lymphatic: No significant lymphadenopathy is seen. Reproductive: Prostate is enlarged indenting upon the inferior aspect of the bladder. Other: No abdominal wall hernia or abnormality. No abdominopelvic ascites. Musculoskeletal: Degenerative changes of lumbar spine are noted. 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 This is slightly increased in size when compared with the prior exam at which time it measured 3.8 cm. Recommend follow-up every 2 years. Reference: J Am Coll Radiol 9628;36:629-476. No focal pooling of contrast material is identified to suggest active GI hemorrhage. Scattered atherosclerotic changes are noted as described. NON-VASCULAR Diverticular change without diverticulitis. Stable renal cysts when compare with the prior exam. Stable hepatic cystic lesions when compared with the prior CT. Calcifications are seen likely related to prior hemorrhage. Rounded fluid collection adjacent to the pericardium likely representing a small pericardial cyst or trapped fluid within the fissure. This is decreased in size from the most recent exam from May of 2022. Electronically Signed   By: Inez Catalina M.D.   On: 11/27/2020 21:15     Scheduled Meds:  sodium chloride   Intravenous Once   dextromethorphan-guaiFENesin  1 tablet Oral BID   furosemide  40 mg Intravenous Once   multivitamin with minerals  1 tablet Oral Daily   pantoprazole (PROTONIX) IV  40 mg Intravenous Q12H  propranolol  20 mg Oral Daily   rosuvastatin  10 mg Oral Daily   sodium chloride flush  3 mL Intravenous Q12H   sodium chloride flush  3 mL Intravenous Q12H   Continuous Infusions:  sodium chloride      LOS: 1 day   Roxan Hockey M.D on 11/29/2020 at 6:30 PM  Go to [email or web address] - for contact info  [facility] - Office  [phone]  If 7PM-7AM, please contact night-coverage [email or web address] Password [facility] 11/29/2020, 6:30 PM

## 2020-11-29 NOTE — Anesthesia Procedure Notes (Signed)
Date/Time: 11/29/2020 10:56 AM Performed by: Vista Deck, CRNA Pre-anesthesia Checklist: Patient identified, Emergency Drugs available, Suction available, Timeout performed and Patient being monitored Patient Re-evaluated:Patient Re-evaluated prior to induction Oxygen Delivery Method: Nasal Cannula

## 2020-11-29 NOTE — Anesthesia Postprocedure Evaluation (Signed)
Anesthesia Post Note  Patient: MAXWELL LEMEN  Procedure(s) Performed: COLONOSCOPY WITH PROPOFOL ESOPHAGOGASTRODUODENOSCOPY (EGD) WITH PROPOFOL  Patient location during evaluation: PACU Anesthesia Type: General Level of consciousness: awake and alert and oriented Pain management: pain level controlled Vital Signs Assessment: post-procedure vital signs reviewed and stable Respiratory status: spontaneous breathing, nonlabored ventilation and respiratory function stable Cardiovascular status: blood pressure returned to baseline and stable Postop Assessment: no apparent nausea or vomiting Anesthetic complications: no   No notable events documented.   Last Vitals:  Vitals:   11/29/20 1215 11/29/20 1230  BP: (!) 145/81 135/88  Pulse: 100 87  Resp: 10 (!) 21  Temp:    SpO2: 100% 100%    Last Pain:  Vitals:   11/29/20 1205  TempSrc:   PainSc: Asleep                 Elmira Olkowski C Kandice Schmelter

## 2020-11-29 NOTE — Plan of Care (Signed)

## 2020-11-30 DIAGNOSIS — K625 Hemorrhage of anus and rectum: Secondary | ICD-10-CM

## 2020-11-30 DIAGNOSIS — K219 Gastro-esophageal reflux disease without esophagitis: Secondary | ICD-10-CM

## 2020-11-30 LAB — COMPREHENSIVE METABOLIC PANEL
ALT: 12 U/L (ref 0–44)
AST: 19 U/L (ref 15–41)
Albumin: 3 g/dL — ABNORMAL LOW (ref 3.5–5.0)
Alkaline Phosphatase: 39 U/L (ref 38–126)
Anion gap: 9 (ref 5–15)
BUN: 12 mg/dL (ref 8–23)
CO2: 24 mmol/L (ref 22–32)
Calcium: 8.2 mg/dL — ABNORMAL LOW (ref 8.9–10.3)
Chloride: 104 mmol/L (ref 98–111)
Creatinine, Ser: 0.78 mg/dL (ref 0.61–1.24)
GFR, Estimated: >60 mL/min (ref >60)
Glucose, Bld: 103 mg/dL — ABNORMAL HIGH (ref 70–99)
Potassium: 3.9 mmol/L (ref 3.5–5.1)
Sodium: 137 mmol/L (ref 135–145)
Total Bilirubin: 1.5 mg/dL — ABNORMAL HIGH (ref 0.3–1.2)
Total Protein: 5.3 g/dL — ABNORMAL LOW (ref 6.5–8.1)

## 2020-11-30 LAB — CBC
HCT: 30.3 % — ABNORMAL LOW (ref 39.0–52.0)
Hemoglobin: 10.2 g/dL — ABNORMAL LOW (ref 13.0–17.0)
MCH: 31.1 pg (ref 26.0–34.0)
MCHC: 33.7 g/dL (ref 30.0–36.0)
MCV: 92.4 fL (ref 80.0–100.0)
Platelets: 120 10*3/uL — ABNORMAL LOW (ref 150–400)
RBC: 3.28 MIL/uL — ABNORMAL LOW (ref 4.22–5.81)
RDW: 15.5 % (ref 11.5–15.5)
WBC: 5.9 10*3/uL (ref 4.0–10.5)
nRBC: 0 % (ref 0.0–0.2)

## 2020-11-30 LAB — GLUCOSE, CAPILLARY
Glucose-Capillary: 100 mg/dL — ABNORMAL HIGH (ref 70–99)
Glucose-Capillary: 115 mg/dL — ABNORMAL HIGH (ref 70–99)
Glucose-Capillary: 89 mg/dL (ref 70–99)
Glucose-Capillary: 95 mg/dL (ref 70–99)

## 2020-11-30 MED ORDER — CHLORHEXIDINE GLUCONATE CLOTH 2 % EX PADS
6.0000 | MEDICATED_PAD | Freq: Every day | CUTANEOUS | Status: DC
  Administered 2020-11-30 – 2020-12-01 (×2): 6 via TOPICAL

## 2020-11-30 NOTE — Evaluation (Signed)
Physical Therapy Evaluation Patient Details Name: Tristan Lopez MRN: [id number] DOB: [birth year] Today's Date: 11/30/2020  History of Present Illness  Tristan Lopez  is a 87 y.o. male with past medical history relevant for HTN, history of pancolonic diverticulosis, history of reflux esophagitis and gastric ulcer, obesity and OSA, BPH with LUTS requiring chronic indwelling Foley, COPD, chronic atrial fibrillation on chronic anticoagulation with Coumadin who presents to the ED with concern for recurrent episodes of BRBPR since 11/26/2020   Clinical Impression  Patient functioning near baseline for functional mobility and gait demonstrating good return for ambulation in room and hallway without loss of balance and without need of an AD.  Plan:  Patient discharged from physical therapy to care of nursing for ambulation daily as tolerated for length of stay.         Recommendations for follow up therapy are one component of a multi-disciplinary discharge planning process, led by the attending physician.  Recommendations may be updated based on patient status, additional functional criteria and insurance authorization.  Follow Up Recommendations No PT follow up    Assistance Recommended at Discharge PRN  Functional Status Assessment    Equipment Recommendations  None recommended by PT    Recommendations for Other Services       Precautions / Restrictions Precautions Precautions: None Restrictions Weight Bearing Restrictions: No      Mobility  Bed Mobility Overal bed mobility: Modified Independent                  Transfers Overall transfer level: Modified independent                      Ambulation/Gait Ambulation/Gait assistance: Modified independent (Device/Increase time) Gait Distance (Feet): 120 Feet Assistive device: None Gait Pattern/deviations: WFL(Within Functional Limits) Gait velocity: decreased     General Gait Details: grossly WFL with good  return for ambulation in room and hallway without loss of balance  Stairs            Wheelchair Mobility    Modified Rankin (Stroke Patients Only)       Balance Overall balance assessment: No apparent balance deficits (not formally assessed)                                           Pertinent Vitals/Pain Pain Assessment: No/denies pain    Home Living Family/patient expects to be discharged to:: Private residence Living Arrangements: Alone Available Help at Discharge: Family;Neighbor;Available PRN/intermittently Type of Home: House Home Access: Stairs to enter Entrance Stairs-Rails: None Entrance Stairs-Number of Steps: 1 Alternate Level Stairs-Number of Steps: 12 Home Layout: Two level;Able to live on main level with bedroom/bathroom;Full bath on main level Home Equipment: Rollator (4 wheels);Cane - single point      Prior Function Prior Level of Function : Independent/Modified Independent             Mobility Comments: household ambulator without AD, uses SPC for longer distances ADLs Comments: assisted by family     Hand Dominance        Extremity/Trunk Assessment   Upper Extremity Assessment Upper Extremity Assessment: Overall WFL for tasks assessed    Lower Extremity Assessment Lower Extremity Assessment: Overall WFL for tasks assessed    Cervical / Trunk Assessment Cervical / Trunk Assessment: Normal  Communication   Communication: No difficulties  Cognition Arousal/Alertness: Awake/alert Behavior During  Therapy: WFL for tasks assessed/performed Overall Cognitive Status: Within Functional Limits for tasks assessed                                          General Comments      Exercises     Assessment/Plan    PT Assessment Patient does not need any further PT services  PT Problem List         PT Treatment Interventions      PT Goals (Current goals can be found in the Care Plan section)   Acute Rehab PT Goals Patient Stated Goal: return home with family to assist PT Goal Formulation: With patient/family Time For Goal Achievement: 11/30/20 Potential to Achieve Goals: Good    Frequency     Barriers to discharge        Co-evaluation               AM-PAC PT "6 Clicks" Mobility  Outcome Measure Help needed turning from your back to your side while in a flat bed without using bedrails?: None Help needed moving from lying on your back to sitting on the side of a flat bed without using bedrails?: None Help needed moving to and from a bed to a chair (including a wheelchair)?: None Help needed standing up from a chair using your arms (e.g., wheelchair or bedside chair)?: None Help needed to walk in hospital room?: None Help needed climbing 3-5 steps with a railing? : None 6 Click Score: 24    End of Session   Activity Tolerance: Patient tolerated treatment well Patient left: in chair;with call bell/phone within reach;with family/visitor present Nurse Communication: Mobility status PT Visit Diagnosis: Unsteadiness on feet (R26.81);Other abnormalities of gait and mobility (R26.89);Muscle weakness (generalized) (M62.81)    Time: 1350-1416 PT Time Calculation (min) (ACUTE ONLY): 26 min   Charges:   PT Evaluation $PT Eval Moderate Complexity: 1 Mod PT Treatments $Therapeutic Activity: 23-37 mins        2:39 PM, 11/30/20 Lonell Grandchild, MPT Physical Therapist with [facility] [phone] office [phone] mobile phone

## 2020-11-30 NOTE — Progress Notes (Signed)
Patient is out of blood ID band stickers. Nurse was told by blood bank to write patient's blood ID band number on release paper. Joslyn Devon RN witnessed and verified numbers were matched, charge nurse Marine scientist notified.

## 2020-11-30 NOTE — Progress Notes (Signed)
PROGRESS NOTE     Tristan Lopez, is a 87 y.o. male, DOB - 10-Apr-1930, VVO:[id number]  Admit date - 11/27/2020   Admitting Physician Courage Denton Brick, MD  Outpatient Primary MD for the patient is Noreene Larsson, NP  LOS - 2  Chief Complaint  Patient presents with   Rectal Bleeding        Brief Narrative:   87 y.o. male with past medical history relevant for HTN, history of pancolonic diverticulosis, history of reflux esophagitis and gastric ulcer, obesity and OSA, BPH with LUTS requiring chronic indwelling Foley, COPD, chronic atrial fibrillation on chronic anticoagulation with Coumadin admitted on 11/27/2020 with bright red blood per rectum/acute GI bleed with hemoglobin dropping from a baseline around 12 to around 8, in the setting of therapeutic INR of 2.7  Assessment & Plan:   Principal Problem:   Acute GI bleeding Active Problems:   GERD (gastroesophageal reflux disease)   Atrial fibrillation ([place])   Long term (current) use of anticoagulants   Essential hypertension   AAA (abdominal aortic aneurysm) without rupture   COPD (chronic obstructive pulmonary disease) ([place])   ABLA (acute blood loss anemia)   Rectal bleeding   1)Acute GI bleed in the setting of chronic anticoagulation with INR of 2.7 --Review of records shows that Patient had EGD and colonoscopy in October 2013--- at that time colonoscopy showed pancolonic diverticulosis and internal hemorrhoids, EGD showed reflux esophagitis, antral/gastric ulcers and hiatal hernia - Hgb baseline usually around 12 -During this admission patient has received 3 units of PRBC and 2 units of FFP, patient also received vitamin K -Hemoglobin drifting down again -Patient received 2 more units PRBC on 11/29/2020 for total of 5 units of PRBC this admission -Hemoglobin currently 10.2 -No overnight signs of overt bleeding appreciated. --GI consultation and recommendations appreciated. -- CTA abdomen and pelvis from 11/27/2020 without  active bleeding if bleeding  -Continue PPI twice a day. -Colonoscopy on 11/29/2020 with diverticular bleeding treated with hemostatic clips   2) chronic atrial fibrillation---- -Continue propanolol for rate control -Coumadin remains on hold as per GI service recommendations, plan is to resume Coumadin without loading dose on 12/02/2020.   3)COPD--- stable and without signs of acute exacerbation -Continue as needed bronchodilators.   4)HTN- -Stable vital signs. -Continue propranolol, -Heart healthy diet has been discussed with patient.   5)HLD- -continue Crestor   6)BPH with LUTs-- - continue indwelling Foley catheter, no hematuria appreciated. -Continue patient follow-up with urology service.   7)Class 1 Obesity-/OSA -Low calorie diet, portion control and increase physical activity discussed with patient -Body mass index is 33.97 kg/m. -Continue CPAP Qhs    8) abnormal chest x-ray-- Nodular opacity in left lower lung. -Following recommendations by radiology service CT chest was performed demonstrating no acute or concerning findings  Disposition/Need for in-Hospital Stay- patient unable to be discharged at this time due to -acute GI bleed requiring transfusion and reversal of anticoagulation with FFP, transfusion of PRBCs and prep for endoluminal evaluation.   -Possible discharge home in a couple of days if remains hemostatic   Dispo: The patient is from: Home              Anticipated d/c is to: Home              Anticipated d/c date is: 1 day              Patient currently is not medically stable to d/c. Barriers: Not Clinically Stable-   Procedures:- 11/29/2020 EGD  and colonoscopy  Code Status :  -  Code Status: DNR   Family Communication:  -Daughter at bedside.  Consults  :  Gi  DVT Prophylaxis  :   - SCDs  SCDs Start: 11/27/20 1901 Place TED hose Start: 11/27/20 1901   Lab Results  Component Value Date   PLT 120 (L) 11/30/2020    Inpatient  Medications  Scheduled Meds:  dextromethorphan-guaiFENesin  1 tablet Oral BID   multivitamin with minerals  1 tablet Oral Daily   pantoprazole (PROTONIX) IV  40 mg Intravenous Q12H   propranolol  20 mg Oral Daily   rosuvastatin  10 mg Oral Daily   sodium chloride flush  3 mL Intravenous Q12H   sodium chloride flush  3 mL Intravenous Q12H   Continuous Infusions:  sodium chloride     PRN Meds:.sodium chloride, acetaminophen **OR** acetaminophen, albuterol, bisacodyl, labetalol, ondansetron **OR** ondansetron (ZOFRAN) IV, polyethylene glycol, sodium chloride flush, traZODone   Anti-infectives (From admission, onward)    None        Subjective: No fever, no chest pain, no nausea, no vomiting.  Patient reports no overt bleeding overnight. Has tolerated clear liquids -Daughter updated at bedside.    Objective: Vitals:   11/30/20 0700 11/30/20 0908 11/30/20 1200 11/30/20 1400  BP: 130/64 115/65 118/66 (!) 132/95  Pulse: 69 73 66 70  Resp: 19  19 19   Temp: 97.6 F (36.4 C)  97.9 F (36.6 C) 97.7 F (36.5 C)  TempSrc: Oral  Oral Oral  SpO2: 100%  99% 100%  Weight:      Height:        Intake/Output Summary (Last 24 hours) at 11/30/2020 1812 Last data filed at 11/30/2020 1400 Gross per 24 hour  Intake 6595 ml  Output 4950 ml  Net 1645 ml   Filed Weights   11/27/20 0832 11/29/20 1012  Weight: 104.3 kg 102.1 kg    Physical Exam General exam: Alert, awake, oriented x 3; reports some discomfort for in his abdomen.  No operative bleeding overnight.  No nausea, no vomiting, no chest pain, no shortness of breath. Respiratory system: Clear to auscultation. Respiratory effort normal.  No requiring oxygen supplementation. Cardiovascular system:RRR. No murmurs, rubs, gallops.  No JVD. Gastrointestinal system: Abdomen is obese, nondistended, soft and nontender on palpation.  Positive bowel sounds appreciated. Central nervous system: Alert and oriented. No focal neurological  deficits. Extremities: No cyanosis or clubbing; no edema on exam. Skin: No petechiae. GU: Chronic Foley catheter in place; no hematuria appreciated. Psychiatry: Judgement and insight appear normal. Mood & affect appropriate.   Data Reviewed: I have personally reviewed following labs and imaging studies  CBC: Recent Labs  Lab 11/27/20 0850 11/27/20 0911 11/27/20 1957 11/27/20 2236 11/28/20 0014 11/28/20 0610 11/28/20 2130 11/29/20 0509 11/30/20 0450  WBC 3.2*  --  3.9*  --   --  3.5*  --  3.8* 5.9  NEUTROABS 1.6*  --   --   --   --   --   --   --   --   HGB 8.5*   < > 7.6*   < > 7.0* 7.1* 8.1* 7.8* 10.2*  HCT 26.7*   < > 23.3*   < > 21.1* 21.7* 24.3* 23.8* 30.3*  MCV 100.4*  --  97.1  --   --  97.3  --  94.4 92.4  PLT 155  --  129*  --   --  130*  --  124* 120*   < > =  values in this interval not displayed.   Basic Metabolic Panel: Recent Labs  Lab 11/27/20 0850 11/27/20 0911 11/27/20 1255 11/28/20 0610 11/29/20 0509 11/30/20 0450  NA 138 139 140 141 138 137  K 3.7 3.6 3.6 3.6 3.2* 3.9  CL 104 101 102 107 106 104  CO2 28  --   --  28 28 24   GLUCOSE 99 95 86 114* 103* 103*  BUN 29* 27* 28* 26* 21 12  CREATININE 0.86 0.90 1.00 0.88 0.84 0.78  CALCIUM 8.5*  --   --  8.1* 8.0* 8.2*  PHOS  --   --   --   --  2.5  --    GFR: Estimated Creatinine Clearance: 76.6 mL/min (by C-G formula based on SCr of 0.78 mg/dL).  Liver Function Tests: Recent Labs  Lab 11/27/20 0850 11/29/20 0509 11/30/20 0450  AST 18  --  19  ALT 12  --  12  ALKPHOS 39  --  39  BILITOT 0.6  --  1.5*  PROT 5.6*  --  5.3*  ALBUMIN 3.2* 2.9* 3.0*   Coagulation Profile: Recent Labs  Lab 11/27/20 0850 11/27/20 1957 11/28/20 0610 11/29/20 0509  INR 2.7* 1.8* 1.9* 1.2    CBG: Recent Labs  Lab 11/29/20 1636 11/30/20 0003 11/30/20 0613 11/30/20 1119 11/30/20 1630  GLUCAP 86 115* 89 100* 95    Urine analysis:    Component Value Date/Time   COLORURINE YELLOW 06/09/2020 0424    APPEARANCEUR Clear 09/21/2020 1431   LABSPEC 1.009 06/09/2020 0424   PHURINE 6.0 06/09/2020 0424   GLUCOSEU Negative 09/21/2020 1431   HGBUR NEGATIVE 06/09/2020 0424   BILIRUBINUR Negative 09/21/2020 1431   KETONESUR NEGATIVE 06/09/2020 0424   PROTEINUR Negative 09/21/2020 1431   PROTEINUR NEGATIVE 06/09/2020 0424   UROBILINOGEN 0.2 10/04/2011 2218   NITRITE Negative 09/21/2020 1431   NITRITE NEGATIVE 06/09/2020 0424   LEUKOCYTESUR Negative 09/21/2020 1431   LEUKOCYTESUR NEGATIVE 06/09/2020 0424   Sepsis Labs: @LABRCNTIP (procalcitonin:4,lacticidven:4)  Recent Results (from the past 240 hour(s))  Resp Panel by RT-PCR (Flu A&B, Covid) Nasopharyngeal Swab     Status: None   Collection Time: 11/27/20 11:55 AM   Specimen: Nasopharyngeal Swab; Nasopharyngeal(NP) swabs in vial transport medium  Result Value Ref Range Status   SARS Coronavirus 2 by RT PCR NEGATIVE NEGATIVE Final    Comment: (NOTE) SARS-CoV-2 target nucleic acids are NOT DETECTED.  The SARS-CoV-2 RNA is generally detectable in upper respiratory specimens during the acute phase of infection. The lowest concentration of SARS-CoV-2 viral copies this assay can detect is 138 copies/mL. A negative result does not preclude SARS-Cov-2 infection and should not be used as the sole basis for treatment or other patient management decisions. A negative result may occur with  improper specimen collection/handling, submission of specimen other than nasopharyngeal swab, presence of viral mutation(s) within the areas targeted by this assay, and inadequate number of viral copies(<138 copies/mL). A negative result must be combined with clinical observations, patient history, and epidemiological information. The expected result is Negative.  Fact Sheet for Patients:  EntrepreneurPulse.com.au  Fact Sheet for Healthcare Providers:  IncredibleEmployment.be  This test is no t yet approved or cleared by  the [place] FDA and  has been authorized for detection and/or diagnosis of SARS-CoV-2 by FDA under an Emergency Use Authorization (EUA). This EUA will remain  in effect (meaning this test can be used) for the duration of the COVID-19 declaration under Section 564(b)(1) of the Act, 21 U.S.C.section  360bbb-3(b)(1), unless the authorization is terminated  or revoked sooner.       Influenza A by PCR NEGATIVE NEGATIVE Final   Influenza B by PCR NEGATIVE NEGATIVE Final    Comment: (NOTE) The Xpert Xpress SARS-CoV-2/FLU/RSV plus assay is intended as an aid in the diagnosis of influenza from Nasopharyngeal swab specimens and should not be used as a sole basis for treatment. Nasal washings and aspirates are unacceptable for Xpert Xpress SARS-CoV-2/FLU/RSV testing.  Fact Sheet for Patients: EntrepreneurPulse.com.au  Fact Sheet for Healthcare Providers: IncredibleEmployment.be  This test is not yet approved or cleared by the [place] FDA and has been authorized for detection and/or diagnosis of SARS-CoV-2 by FDA under an Emergency Use Authorization (EUA). This EUA will remain in effect (meaning this test can be used) for the duration of the COVID-19 declaration under Section 564(b)(1) of the Act, 21 U.S.C. section 360bbb-3(b)(1), unless the authorization is terminated or revoked.  Performed at [facility], [address] [place], [place] [address]    Radiology Studies: No results found.  Scheduled Meds:  dextromethorphan-guaiFENesin  1 tablet Oral BID   multivitamin with minerals  1 tablet Oral Daily   pantoprazole (PROTONIX) IV  40 mg Intravenous Q12H   propranolol  20 mg Oral Daily   rosuvastatin  10 mg Oral Daily   sodium chloride flush  3 mL Intravenous Q12H   sodium chloride flush  3 mL Intravenous Q12H   Continuous Infusions:  sodium chloride      LOS: 2 days   Barton Dubois M.D on 11/30/2020 at 6:12 PM  Go to  [email or web address] - for contact info  [facility] - Office  [phone]  If 7PM-7AM, please contact night-coverage [email or web address] Password [facility] 11/30/2020, 6:12 PM

## 2020-11-30 NOTE — Progress Notes (Signed)
Subjective: Had one episode of watery diarrhea this morning without melena or hematochezia. Had a little abdominal pain this morning prior to BM. States abdominal pain improved after BM. He denies nausea or vomiting currently, had mild nausea last night but no emesis. States he walked to the restroom this morning and felt good. He reports that he is hungry. Tolerating clear liquids well. He denies any obvious reflux symptoms, however, continues to have phlegm feeling in his throat and clears his throat often, this was felt in the past not likely related to reflux, however, its possible that this is a sign of silent reflux.  Objective: Vital signs in last 24 hours: Temp:  [97.6 F (36.4 C)-99.6 F (37.6 C)] 97.6 F (36.4 C) (11/09 0700) Pulse Rate:  [69-100] 73 (11/09 0908) Resp:  [10-21] 19 (11/09 0700) BP: (115-154)/(64-88) 115/65 (11/09 0908) SpO2:  [98 %-100 %] 100 % (11/09 0700) Weight:  [102.1 kg] 102.1 kg (11/08 1012) Last BM Date: 11/29/20 General:   Alert and oriented, pleasant Head:  Normocephalic and atraumatic. Eyes:  No icterus, sclera clear. Conjuctiva pink.  Mouth:  Without lesions, mucosa pink and moist.   Heart:  S1, S2 present, no murmurs noted.  Lungs: Clear to auscultation bilaterally, without wheezing, rales, or rhonchi.  Abdomen:  Bowel sounds present, soft, non-tender, non-distended. No HSM or hernias noted. No rebound or guarding. No masses appreciated  Msk:  Symmetrical without gross deformities. Normal posture. Pulses:  Normal pulses noted. Extremities:  Without clubbing or edema. Neurologic:  Alert and  oriented x4;  grossly normal neurologically. Skin:  Warm and dry, intact without significant lesions.  Psych:  Alert and cooperative. Normal mood and affect.  Intake/Output from previous day: 11/08 0701 - 11/09 0700 In: 4496 [P.O.:840; Blood:945] Out: 3400 [Urine:3400] Intake/Output this shift: No intake/output data recorded.  Lab Results: Recent Labs     11/28/20 0610 11/28/20 2130 11/29/20 0509 11/30/20 0450  WBC 3.5*  --  3.8* 5.9  HGB 7.1* 8.1* 7.8* 10.2*  HCT 21.7* 24.3* 23.8* 30.3*  PLT 130*  --  124* 120*   BMET Recent Labs    11/28/20 0610 11/29/20 0509 11/30/20 0450  NA 141 138 137  K 3.6 3.2* 3.9  CL 107 106 104  CO2 28 28 24   GLUCOSE 114* 103* 103*  BUN 26* 21 12  CREATININE 0.88 0.84 0.78  CALCIUM 8.1* 8.0* 8.2*   LFT Recent Labs    11/29/20 0509 11/30/20 0450  PROT  --  5.3*  ALBUMIN 2.9* 3.0*  AST  --  19  ALT  --  12  ALKPHOS  --  39  BILITOT  --  1.5*   PT/INR Recent Labs    11/28/20 0610 11/29/20 0509  LABPROT 21.5* 15.5*  INR 1.9* 1.2   Assessment: Karman Veney is an 87 year old male presenting this admission with symptomatic anemia r/t acute blood loss in setting of GI bleeding, on coumadin, with INR 2.7 on admission. Hgb 8.5 initially, down to 6.9 at lowest, received 5 units total of PRBCs thus far. INR 1.9 which he received 2 units FFP for on Sunday and Vit K 10mg  IV x1 Monday. CTA negative for active bleeding, last colonocopy 2013 with pancolonic diverticulosis and tubular adenoma. EGD also in 2013 w/ erosive reflux esophagitis and possible area of healing ulceration s/p biopsy, H Pylori negative.  Patient reported rectal bleeding was dark/black in color, changing over to red just a few days prior to admission. Was taking  prednisone 5mg  outpatient but remained on protonix BID, denied any other consistent NSAID use. EGD/colonoscopy yesterday with bleeding diverticula, s/p injection and clipping. Recommended evaluation by IR if rectal bleeding reoccurs or pt becomes hemodynamically unstable. Hemoglobin improved to 10.2 and patient without rectal bleeding today which is reassuring.  Patient is stable from GI standpoint, GI will sign off today.  Plan: Continue PPI BID Outpatient GI follow up Discuss colonoscopy with polypectomy on outpatient basis during clinic follow up Consider IR  evaluation for Overt GI bleeding that reoccurs or if pt becomes hemodynamically unstable   LOS: 2 days    11/30/2020, 9:56 AM   Neko Boyajian L. Alver Sorrow, MSN, APRN, AGNP-C Adult-Gerontology Nurse Practitioner [facility]

## 2020-12-01 ENCOUNTER — Encounter (HOSPITAL_COMMUNITY): Payer: Self-pay | Admitting: Gastroenterology

## 2020-12-01 ENCOUNTER — Telehealth: Payer: Self-pay | Admitting: Gastroenterology

## 2020-12-01 DIAGNOSIS — G4733 Obstructive sleep apnea (adult) (pediatric): Secondary | ICD-10-CM

## 2020-12-01 DIAGNOSIS — E6609 Other obesity due to excess calories: Secondary | ICD-10-CM

## 2020-12-01 DIAGNOSIS — Z7901 Long term (current) use of anticoagulants: Secondary | ICD-10-CM

## 2020-12-01 DIAGNOSIS — I10 Essential (primary) hypertension: Secondary | ICD-10-CM

## 2020-12-01 DIAGNOSIS — I4891 Unspecified atrial fibrillation: Secondary | ICD-10-CM

## 2020-12-01 DIAGNOSIS — E66811 Obesity, class 1: Secondary | ICD-10-CM

## 2020-12-01 DIAGNOSIS — Z6833 Body mass index (BMI) 33.0-33.9, adult: Secondary | ICD-10-CM

## 2020-12-01 DIAGNOSIS — Z9989 Dependence on other enabling machines and devices: Secondary | ICD-10-CM

## 2020-12-01 DIAGNOSIS — J449 Chronic obstructive pulmonary disease, unspecified: Secondary | ICD-10-CM

## 2020-12-01 LAB — TYPE AND SCREEN
ABO/RH(D): AB POS
Antibody Screen: NEGATIVE
Unit division: 0
Unit division: 0

## 2020-12-01 LAB — BPAM RBC
Blood Product Expiration Date: 202211272359
Blood Product Expiration Date: 202212072359
ISSUE DATE / TIME: 202211082102
ISSUE DATE / TIME: 202211090016
Unit Type and Rh: 6200
Unit Type and Rh: 6200

## 2020-12-01 LAB — CBC
HCT: 29.5 % — ABNORMAL LOW (ref 39.0–52.0)
Hemoglobin: 9.7 g/dL — ABNORMAL LOW (ref 13.0–17.0)
MCH: 30.9 pg (ref 26.0–34.0)
MCHC: 32.9 g/dL (ref 30.0–36.0)
MCV: 93.9 fL (ref 80.0–100.0)
Platelets: 150 10*3/uL (ref 150–400)
RBC: 3.14 MIL/uL — ABNORMAL LOW (ref 4.22–5.81)
RDW: 15.5 % (ref 11.5–15.5)
WBC: 5.5 10*3/uL (ref 4.0–10.5)
nRBC: 0 % (ref 0.0–0.2)

## 2020-12-01 LAB — GLUCOSE, CAPILLARY: Glucose-Capillary: 100 mg/dL — ABNORMAL HIGH (ref 70–99)

## 2020-12-01 MED ORDER — AMLODIPINE BESYLATE 10 MG PO TABS: 5.0000 mg | ORAL_TABLET | Freq: Every day | ORAL | Status: DC

## 2020-12-01 MED ORDER — WARFARIN SODIUM 5 MG PO TABS: 5.0000 mg | ORAL_TABLET | Freq: Every day | ORAL | Status: DC

## 2020-12-01 NOTE — Discharge Summary (Signed)
Physician Discharge Summary  Tristan Lopez AYT:[id number] DOB: 1930/07/29 DOA: 11/27/2020  PCP: Noreene Larsson, NP  Admit date: 11/27/2020 Discharge date: 12/01/2020  Time spent: 35 minutes  Recommendations for Outpatient Follow-up:  Repeat CBC to follow hemoglobin trend and instability Repeat basic metabolic panels of electrolytes renal function Reassess blood pressure and adjust antihypertensive regimen as needed.  Discharge Diagnoses:  Principal Problem:   Acute GI bleeding Active Problems:   GERD (gastroesophageal reflux disease)   Atrial fibrillation ([place])   Long term (current) use of anticoagulants   Essential hypertension   AAA (abdominal aortic aneurysm) without rupture   COPD (chronic obstructive pulmonary disease) (HCC)   OSA on CPAP   ABLA (acute blood loss anemia)   Rectal bleeding   Class 1 obesity due to excess calories with serious comorbidity and body mass index (BMI) of 33.0 to 33.9 in adult   Discharge Condition: Stable and improved.  Discharged home with instruction to follow-up with PCP and gastroenterology service as an outpatient.  CODE STATUS: Full code.  Diet recommendation: Heart healthy diet.  Filed Weights   11/27/20 0832 11/29/20 1012  Weight: 104.3 kg 102.1 kg    Brief history of present illness:   87 y.o. male with past medical history relevant for HTN, history of pancolonic diverticulosis, history of reflux esophagitis and gastric ulcer, obesity and OSA, BPH with LUTS requiring chronic indwelling Foley, COPD, chronic atrial fibrillation on chronic anticoagulation with Coumadin admitted on 11/27/2020 with bright red blood per rectum/acute GI bleed with hemoglobin dropping from a baseline around 12 to around 8, in the setting of therapeutic INR of 2.7  Hospital Course:   1)Acute GI bleed in the setting of chronic anticoagulation with INR of 2.7 --Review of records shows that Patient had EGD and colonoscopy in October 2013--- at that time  colonoscopy showed pancolonic diverticulosis and internal hemorrhoids, EGD showed reflux esophagitis, antral/gastric ulcers and hiatal hernia - Hgb baseline usually around 12 -During this admission patient has received 3 units of PRBC and 2 units of FFP, patient also received vitamin K -Hemoglobin drifting down again -Patient received 2 more units PRBC on 11/29/2020 for total of 5 units of PRBC this admission -Hemoglobin currently 9.7 and is stable; no operative bleeding appreciated. -Patient able to tolerate diet without problems. -GI consultation and recommendations appreciated. -- CTA abdomen and pelvis from 11/27/2020 without active bleeding. -Continue PPI twice a day. -Colonoscopy on 11/29/2020 with diverticular bleeding treated with hemostatic clips. -Patient has been instructed to avoid the use of aspirin or any other NSAIDs.   2) chronic atrial fibrillation -Continue propanolol for rate control -Coumadin remains on hold as per GI service recommendations, plan is to resume Coumadin without loading dose on 12/02/2020. -Recommending to follow patient's INR in approximately 5 to 7 days.   3)COPD--- stable and without signs of acute exacerbation -Continue as needed bronchodilators.   4)HTN- -Stable vital signs at time of discharge.. -Continue propranolol and resume adjusted dose of antihypertensive agents. -Heart healthy diet has been discussed with patient.   5)HLD- -continue Crestor -Heart healthy diet encouraged.   6)BPH with LUTs-- - continue indwelling Foley catheter, no hematuria appreciated. -Patient's Foley catheter has been exchanged during hospitalization. -Continue outpatient follow-up with urology service.   7)Class 1 Obesity-/OSA -Low calorie diet, portion control and increase physical activity discussed with patient -Body mass index is 33.23 kg/m. -Continue CPAP Qhs    8) abnormal chest x-ray-- Nodular opacity in left lower lung. -Following recommendations by  radiology service CT chest was performed demonstrating no acute or concerning findings.  Procedures: See below for x-ray reports Colonoscopy: Positive polyps and diverticulosis appreciated.  Consultations: Gastroenterology service  Discharge Exam: Vitals:   11/30/20 2103 12/01/20 0542  BP: 130/78 (!) 146/84  Pulse: 78 76  Resp: 14 18  Temp: 98.8 F (37.1 C) 99 F (37.2 C)  SpO2: 100% 100%    General: Afebrile, no chest pain, no nausea, no vomiting, no palpitations, no abdominal pain.  No operative bleeding reported.  Tolerating diet. Cardiovascular: Rate controlled, no rubs, no gallops, no JVD. Respiratory: Good air movement bilaterally, no requiring oxygen supplementation; no using accessory muscles. Abdomen: Soft, nontender, positive bowel sounds Extremities: No cyanosis or clubbing.  Discharge Instructions   Discharge Instructions     Diet - low sodium heart healthy   Complete by: As directed    Discharge instructions   Complete by: As directed    Follow heart healthy diet Take medications as prescribed Maintain adequate hydration Arrange follow-up with PCP in 10 days Follow-up with gastroenterology as instructed (office will contact you with appointment details)      Allergies as of 12/01/2020       Reactions   Latex Rash        Medication List     STOP taking these medications    potassium chloride 10 MEQ tablet Commonly known as: KLOR-CON   predniSONE 5 MG tablet Commonly known as: DELTASONE   silodosin 8 MG Caps capsule Commonly known as: RAPAFLO       TAKE these medications    amLODipine 10 MG tablet Commonly known as: NORVASC Take 0.5 tablets (5 mg total) by mouth daily. What changed: how much to take   benazepril 40 MG tablet Commonly known as: LOTENSIN Take 1 tablet (40 mg total) by mouth daily.   clotrimazole-betamethasone cream Commonly known as: Lotrisone Apply 1 application topically 2 (two) times daily.   DIGESTIVE  ADVANTAGE PO Take by mouth 2 (two) times daily.   furosemide 20 MG tablet Commonly known as: LASIX Take 1 tablet (20 mg total) by mouth daily. Take 20 mg daily as needed for leg swelling   Garlic 7673 MG Caps Take 1,000 mg by mouth daily.   pantoprazole 40 MG tablet Commonly known as: PROTONIX Take 1 tablet (40 mg total) by mouth 2 (two) times daily.   potassium chloride 10 MEQ tablet Commonly known as: KLOR-CON Take 1 tablet (10 mEq total) by mouth daily.   propranolol 20 MG tablet Commonly known as: INDERAL Take 1 tablet (20 mg total) by mouth daily.   rosuvastatin 10 MG tablet Commonly known as: CRESTOR Take 1 tablet (10 mg total) by mouth daily.   tadalafil 5 MG tablet Commonly known as: CIALIS TAKE 1 TABLET (5 MG TOTAL) BY MOUTH DAILY.   vitamin C 1000 MG tablet Take 1,000 mg by mouth daily.   Vitamin D-3 25 MCG (1000 UT) Caps Take 2,000 Units by mouth.   warfarin 5 MG tablet Commonly known as: COUMADIN Take as directed. If you are unsure how to take this medication, talk to your nurse or doctor. Original instructions: Take 1 tablet (5 mg total) by mouth daily. Start taking on: December 02, 2020       Allergies  Allergen Reactions   Latex Rash    Follow-up Information     Noreene Larsson, NP. Schedule an appointment as soon as possible for a visit in 10 day(s).   Specialty: Nurse Practitioner  Contact information: [address]  [address] [place] [address] [phone]         Satira Sark, MD .   Specialty: Cardiology Contact information: [place] [address] [phone]                 The results of significant diagnostics from this hospitalization (including imaging, microbiology, ancillary and laboratory) are listed below for reference.    Significant Diagnostic Studies: DG Chest 1 View  Result Date: 11/27/2020 CLINICAL DATA:  Dyspnea.  Dizziness.  Weakness. EXAM: CHEST  1 VIEW COMPARISON:   07/28/2019 FINDINGS: The heart size and mediastinal contours are within normal limits. Aortic atherosclerotic calcification noted. No evidence of pulmonary infiltrate or pleural effusion. A nodular opacity is also seen in the left lower lung adjacent to the left heart border, which was not seen on previous study. IMPRESSION: Nodular opacity in left lower lung. Recommend chest CT without contrast to exclude pulmonary neoplasm. Electronically Signed   By: Marlaine Hind M.D.   On: 11/27/2020 14:00   CT CHEST WO CONTRAST  Result Date: 11/27/2020 CLINICAL DATA:  Possible mass on recent chest x-ray EXAM: CT CHEST WITHOUT CONTRAST TECHNIQUE: Multidetector CT imaging of the chest was performed following the standard protocol without IV contrast. COMPARISON:  Chest x-ray from earlier in the same day, CT from 06/06/2020 FINDINGS: Cardiovascular: Somewhat limited due to lack of IV contrast. Atherosclerotic calcifications are noted. No aneurysmal dilatation is seen. Heart is mildly enlarged in size with diffuse coronary calcifications identified. Mediastinum/Nodes: Thoracic inlet is within normal limits. No sizable hilar or mediastinal adenopathy is noted. The esophagus as visualized is within normal limits. Lungs/Pleura: Lungs are well aerated bilaterally. Right lung is clear. No focal infiltrate or sizable effusion is noted. Left lung demonstrates a focal fluid attenuation lesion along the left pericardial border which has decreased in size when compared with the prior exam. It previously measured 3.3 cm in greatest transverse dimension and now measures 19 mm in greatest dimension. This likely represents a resolving pericardial cyst or small amount of fluid within the fissure. Given its decrease in size over the past 6 months it is felt to be benign in etiology. No other focal abnormality is noted. Upper Abdomen: As described in recent CT of the abdomen and pelvis on the same day. Musculoskeletal: Degenerative changes of  the thoracic spine are seen. IMPRESSION: Decreasing fluid attenuation lesion along the left cardiac border when compared with the prior exam. This may represent a shrinking pericardial cyst or fluid within the fissure on the left. It is felt to be benign in etiology given its decrease in size. No other focal abnormality is noted. Aortic Atherosclerosis (ICD10-I70.0). Electronically Signed   By: Inez Catalina M.D.   On: 11/27/2020 21:21   CT ANGIO GI BLEED  Result Date: 11/27/2020 CLINICAL DATA:  Rectal bleeding for 1 day EXAM: CTA ABDOMEN AND PELVIS WITHOUT AND WITH CONTRAST TECHNIQUE: Multidetector CT imaging of the abdomen and pelvis was performed using the standard protocol during bolus administration of intravenous contrast. Multiplanar reconstructed images and MIPs were obtained and reviewed to evaluate the vascular anatomy. CONTRAST:  13mL OMNIPAQUE IOHEXOL 350 MG/ML SOLN COMPARISON:  06/06/2020 FINDINGS: VASCULAR Aorta: Atherosclerotic calcifications are noted. Fusiform dilatation of the infrarenal aorta is noted to 3.9 cm. This is slightly increased when compared with the prior exam at which time it measured 3.8 cm. No evidence of dissection is noted. Tapering at the aortic bifurcation is  noted. Celiac: Patent without evidence of aneurysm, dissection, vasculitis or significant stenosis. SMA: Atherosclerotic calcifications are noted although no significant stenosis is seen. Renals: Dual renal arteries are noted on the right. Single renal artery is noted on the left. Heavy calcifications of the left renal artery is seen with only mild stenosis. Mild narrowing at the origin of the right main renal artery is noted as well. IMA: Patent without evidence of aneurysm, dissection, vasculitis or significant stenosis. Inflow: Iliacs are heavily calcified although no aneurysmal dilatation or dissection is seen. Proximal Outflow: Within normal limits. Veins: No specific venous abnormality is noted. Review of the MIP  images confirms the above findings. NON-VASCULAR Lower chest: Lung bases are free of acute infiltrate or sizable effusion. There is a rounded fluid attenuation lesion identified adjacent to the left pericardium which has decreased in size significantly when compared with the prior CT examination at which time it measured approximately 3.4 cm. It now measures 2.0 cm in greatest dimension. Hepatobiliary: Previously seen partially calcified lesion within the dome of the right lobe of the liver anteriorly is again identified and stable in appearance. A similar lesion is noted in the anterior aspect of the lateral segment of the left lobe of the liver also stable in appearance from the prior study. Gallbladder is within normal limits. No other hepatic abnormality is seen. Pancreas: Unremarkable. No pancreatic ductal dilatation or surrounding inflammatory changes. Spleen: Normal in size without focal abnormality. Adrenals/Urinary Tract: Adrenal glands are within normal limits bilaterally. Kidneys are well visualized bilaterally and again demonstrate multiple bilateral renal cysts stable in appearance from the prior exam. Scattered vascular calcifications are noted. No definitive renal calculi or obstructive changes are seen. The bladder is decompressed by Foley catheter. Stomach/Bowel: Scattered diverticular change of the colon is noted. No findings of diverticulitis are seen. No obstructive or inflammatory changes are noted. The appendix is within normal limits. No focal pooling of contrast material is identified to suggest active GI hemorrhage. No small bowel or gastric abnormality is noted. Lymphatic: No significant lymphadenopathy is seen. Reproductive: Prostate is enlarged indenting upon the inferior aspect of the bladder. Other: No abdominal wall hernia or abnormality. No abdominopelvic ascites. Musculoskeletal: Degenerative changes of lumbar spine are noted. 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 This is slightly increased in size when compared with the prior exam at which time it measured 3.8 cm. Recommend follow-up every 2 years. Reference: J Am Coll Radiol 5093;26:712-458. No focal pooling of contrast material is identified to suggest active GI hemorrhage. Scattered atherosclerotic changes are noted as described. NON-VASCULAR Diverticular change without diverticulitis. Stable renal cysts when compare with the prior exam. Stable hepatic cystic lesions when compared with the prior CT. Calcifications are seen likely related to prior hemorrhage. Rounded fluid collection adjacent to the pericardium likely representing a small pericardial cyst or trapped fluid within the fissure. This is decreased in size from the most recent exam from May of 2022. Electronically Signed   By: Inez Catalina M.D.   On: 11/27/2020 21:15    Microbiology: Recent Results (from the past 240 hour(s))  Resp Panel by RT-PCR (Flu A&B, Covid) Nasopharyngeal Swab     Status: None   Collection Time: 11/27/20 11:55 AM   Specimen: Nasopharyngeal Swab; Nasopharyngeal(NP) swabs in vial transport medium  Result Value Ref Range Status   SARS Coronavirus 2 by RT PCR NEGATIVE NEGATIVE Final    Comment: (NOTE) SARS-CoV-2 target nucleic acids are NOT DETECTED.  The  SARS-CoV-2 RNA is generally detectable in upper respiratory specimens during the acute phase of infection. The lowest concentration of SARS-CoV-2 viral copies this assay can detect is 138 copies/mL. A negative result does not preclude SARS-Cov-2 infection and should not be used as the sole basis for treatment or other patient management decisions. A negative result may occur with  improper specimen collection/handling, submission of specimen other than nasopharyngeal swab, presence of viral mutation(s) within the areas targeted by this assay, and inadequate number of viral copies(<138 copies/mL). A negative result must be combined  with clinical observations, patient history, and epidemiological information. The expected result is Negative.  Fact Sheet for Patients:  EntrepreneurPulse.com.au  Fact Sheet for Healthcare Providers:  IncredibleEmployment.be  This test is no t yet approved or cleared by the [place] FDA and  has been authorized for detection and/or diagnosis of SARS-CoV-2 by FDA under an Emergency Use Authorization (EUA). This EUA will remain  in effect (meaning this test can be used) for the duration of the COVID-19 declaration under Section 564(b)(1) of the Act, 21 U.S.C.section 360bbb-3(b)(1), unless the authorization is terminated  or revoked sooner.       Influenza A by PCR NEGATIVE NEGATIVE Final   Influenza B by PCR NEGATIVE NEGATIVE Final    Comment: (NOTE) The Xpert Xpress SARS-CoV-2/FLU/RSV plus assay is intended as an aid in the diagnosis of influenza from Nasopharyngeal swab specimens and should not be used as a sole basis for treatment. Nasal washings and aspirates are unacceptable for Xpert Xpress SARS-CoV-2/FLU/RSV testing.  Fact Sheet for Patients: EntrepreneurPulse.com.au  Fact Sheet for Healthcare Providers: IncredibleEmployment.be  This test is not yet approved or cleared by the [place] FDA and has been authorized for detection and/or diagnosis of SARS-CoV-2 by FDA under an Emergency Use Authorization (EUA). This EUA will remain in effect (meaning this test can be used) for the duration of the COVID-19 declaration under Section 564(b)(1) of the Act, 21 U.S.C. section 360bbb-3(b)(1), unless the authorization is terminated or revoked.  Performed at [facility], [address] [place], [place] [address]      Labs: Basic Metabolic Panel: Recent Labs  Lab 11/27/20 0850 11/27/20 0911 11/27/20 1255 11/28/20 0610 11/29/20 0509 11/30/20 0450  NA 138 139 140 141 138 137  K 3.7 3.6 3.6  3.6 3.2* 3.9  CL 104 101 102 107 106 104  CO2 28  --   --  28 28 24   GLUCOSE 99 95 86 114* 103* 103*  BUN 29* 27* 28* 26* 21 12  CREATININE 0.86 0.90 1.00 0.88 0.84 0.78  CALCIUM 8.5*  --   --  8.1* 8.0* 8.2*  PHOS  --   --   --   --  2.5  --    Liver Function Tests: Recent Labs  Lab 11/27/20 0850 11/29/20 0509 11/30/20 0450  AST 18  --  19  ALT 12  --  12  ALKPHOS 39  --  39  BILITOT 0.6  --  1.5*  PROT 5.6*  --  5.3*  ALBUMIN 3.2* 2.9* 3.0*   CBC: Recent Labs  Lab 11/27/20 0850 11/27/20 0911 11/27/20 1957 11/27/20 2236 11/28/20 0610 11/28/20 2130 11/29/20 0509 11/30/20 0450 12/01/20 0450  WBC 3.2*  --  3.9*  --  3.5*  --  3.8* 5.9 5.5  NEUTROABS 1.6*  --   --   --   --   --   --   --   --   HGB 8.5*   < >  7.6*   < > 7.1* 8.1* 7.8* 10.2* 9.7*  HCT 26.7*   < > 23.3*   < > 21.7* 24.3* 23.8* 30.3* 29.5*  MCV 100.4*  --  97.1  --  97.3  --  94.4 92.4 93.9  PLT 155  --  129*  --  130*  --  124* 120* 150   < > = values in this interval not displayed.   CBG: Recent Labs  Lab 11/29/20 1636 11/30/20 0003 11/30/20 0613 11/30/20 1119 11/30/20 1630  GLUCAP 86 115* 89 100* 95   Signed:  Barton Dubois MD.  [facility] 12/01/2020, 9:02 AM

## 2020-12-01 NOTE — Progress Notes (Addendum)
Spoke with patient's son, Glendell Docker. He is agreeable to [facility] services. Patient lives in [place], his PCP is in [place]. Discussed finding a [facility] that can accept his insurance as well as [place].  Referred to [facility], [facility] [place], [facility], [place], [place], [facility], [place] and all agencies are unable to accept patient either due to location, insurance, or staffing.  Son, Glendell Docker, notified, and advised that he would contact insurance company regarding possibility of out of network provider.   Donita Newland, Clydene Pugh, LCSW

## 2020-12-01 NOTE — Telephone Encounter (Signed)
Please arrange for hospital follow up next month with Dr. Gala Romney.

## 2020-12-05 ENCOUNTER — Ambulatory Visit: Payer: Medicare PPO

## 2020-12-06 ENCOUNTER — Encounter: Payer: Self-pay | Admitting: Nurse Practitioner

## 2020-12-08 ENCOUNTER — Ambulatory Visit (INDEPENDENT_AMBULATORY_CARE_PROVIDER_SITE_OTHER): Payer: Medicare PPO | Admitting: *Deleted

## 2020-12-08 DIAGNOSIS — I4821 Permanent atrial fibrillation: Secondary | ICD-10-CM

## 2020-12-08 DIAGNOSIS — Z5181 Encounter for therapeutic drug level monitoring: Secondary | ICD-10-CM

## 2020-12-08 LAB — PROTIME-INR
INR: 1.5 — ABNORMAL HIGH (ref 0.9–1.2)
Prothrombin Time: 15.4 s — ABNORMAL HIGH (ref 9.1–12.0)

## 2020-12-08 NOTE — Patient Instructions (Addendum)
Pt is in independent living facility in [place].  Gets labs done at [facility] in [place].  Son Dominica Severin manages pts medicine.  Lab orders were mailed to [facility] address. See hospital D/C summary.  Dx GI Bleed  Restarted warfarin on 12/02/20 Continue warfarin 5 mg daily except 2.5mg  on Sundays, Tuesdays and Thursdays Recheck on 12/19/20. Instructions Emailed to [email or web address] and message left on cell phone. Orders mailed to home address

## 2020-12-13 ENCOUNTER — Encounter: Payer: Self-pay | Admitting: Nurse Practitioner

## 2020-12-20 ENCOUNTER — Ambulatory Visit (INDEPENDENT_AMBULATORY_CARE_PROVIDER_SITE_OTHER): Payer: Medicare PPO | Admitting: *Deleted

## 2020-12-20 DIAGNOSIS — I4891 Unspecified atrial fibrillation: Secondary | ICD-10-CM

## 2020-12-20 DIAGNOSIS — Z5181 Encounter for therapeutic drug level monitoring: Secondary | ICD-10-CM | POA: Diagnosis not present

## 2020-12-20 LAB — PROTIME-INR
INR: 2.2 — ABNORMAL HIGH (ref 0.9–1.2)
Prothrombin Time: 22.6 s — ABNORMAL HIGH (ref 9.1–12.0)

## 2020-12-20 NOTE — Patient Instructions (Signed)
Pt is in independent living facility in [place].  Gets labs done at [facility] in [place].  Son Glendell Docker manages pts medicine.  Lab orders were mailed to [facility] address. Continue warfarin 5 mg daily except 2.5mg  on Sundays, Tuesdays and Thursdays Recheck on 01/31/21. Called and spoke with Glendell Docker (son).  Coumadin instructions given and he verbalized understanding.

## 2020-12-21 ENCOUNTER — Other Ambulatory Visit: Payer: Self-pay | Admitting: *Deleted

## 2020-12-21 MED ORDER — AMLODIPINE BESYLATE 10 MG PO TABS: 5.0000 mg | ORAL_TABLET | Freq: Every day | ORAL | 0 refills | Status: DC

## 2020-12-27 ENCOUNTER — Ambulatory Visit: Payer: Medicare PPO | Admitting: Nurse Practitioner

## 2020-12-27 ENCOUNTER — Ambulatory Visit: Payer: Medicare PPO | Admitting: Internal Medicine

## 2020-12-27 ENCOUNTER — Telehealth: Payer: Self-pay | Admitting: Nurse Practitioner

## 2020-12-27 ENCOUNTER — Encounter: Payer: Self-pay | Admitting: Internal Medicine

## 2020-12-27 ENCOUNTER — Other Ambulatory Visit: Payer: Self-pay

## 2020-12-27 VITALS — BP 108/64 | HR 69 | Temp 97.6°F | Ht 69.0 in | Wt 217.4 lb

## 2020-12-27 DIAGNOSIS — K922 Gastrointestinal hemorrhage, unspecified: Secondary | ICD-10-CM | POA: Diagnosis not present

## 2020-12-27 DIAGNOSIS — K219 Gastro-esophageal reflux disease without esophagitis: Secondary | ICD-10-CM | POA: Diagnosis not present

## 2020-12-27 NOTE — Patient Instructions (Signed)
It was good seeing you again today!  We will retrieve blood work to be obtained next week to see where your hemoglobin stands  There is no way to prevent a future episode of bleeding on Coumadin.  However, hopefully we will not see this happen.  You have persisting colon polyps.  They are small.  As discussed, we could either take them out at some point in the future or just follow you conservatively. There is no rush on making this decision.  We will plan to see you back in the office in 4 months and as needed  For indigestion/acid reflux, continue Protonix daily-40 mg once daily should suffice.  Office visit here in 4 months.

## 2020-12-27 NOTE — Telephone Encounter (Signed)
wanted to know if they could get lab order sent to [place] in [place]

## 2020-12-27 NOTE — Progress Notes (Signed)
Primary Care Physician:  Noreene Larsson, NP Primary Gastroenterologist:  Dr. Gala Romney  Pre-Procedure History & Physical: HPI:  Tristan Lopez is a 87 y.o. male here for hospital follow-up of a 5 unit GI bleed.  Recently hospitalized last month with hematochezia and while anticoagulated.  Ultimately, patient underwent EGD and a colonoscopy by Dr. Jenetta Downer. He was found to have an actively bleeding left-sided diverticulum.  Fortunately, he was able to perform targeted bleeding control therapy.  Clip placed.  He does have a couple of 5 mm persisting adenomatous appearing polyps in his ascending colon for which no attempts at removal were made during his recent colonoscopy.  Clinically, he is done well; he has brown stool 1-3 daily;  no straining and no red blood or melena.  Appetite is good; reflux symptoms well controlled on Protonix 40 mg twice daily. He has been back on Coumadin for nearly a month. Had a discussion with both he and his oldest son who is in attendance today regarding the utility of doing 1 more colonoscopy to remove polyps  Past Medical History:  Diagnosis Date   Abdominal aortic aneurysm without rupture    Bilateral renal cysts    BPH (benign prostatic hyperplasia)    Carotid artery occlusion    Chronic atrial fibrillation (HCC)    COPD (chronic obstructive pulmonary disease) ([place])    Diverticulosis    Pancolonic   Essential hypertension    Hemorrhoids    Hiatal hernia    Hx of adenomatous colonic polyps    Hypercholesteremia    Liver cyst    Benign by liver biopsy September 2013   Nephrolithiasis    Nephrolithiasis    Pre-diabetes    Reflux esophagitis    Sleep apnea    Uses CPAP   Tubular adenoma     Past Surgical History:  Procedure Laterality Date   COLONOSCOPY     2003, no polyps per patient. Dr. West Carbo   COLONOSCOPY  11/01/11   Dr. Gala Romney- tubular adenoma,suboptimal preparation, internal hemorrhoids, o/w normal rectum. pancolonic diverticulosis    COLONOSCOPY WITH PROPOFOL N/A 11/29/2020   Procedure: COLONOSCOPY WITH PROPOFOL;  Surgeon: Harvel Quale, MD;  [facility];  Service: Gastroenterology;  Laterality: N/A;   ESOPHAGOGASTRODUODENOSCOPY  11/01/11   Dr. Gala Romney- erosive reflux esophagitis along with a patulous EG junction, hialtal hernia, antral erosions with possible area of healing ulceration- s/p bx= chronic erosive gastritis, no malignancy   ESOPHAGOGASTRODUODENOSCOPY (EGD) WITH PROPOFOL N/A 11/29/2020   Procedure: ESOPHAGOGASTRODUODENOSCOPY (EGD) WITH PROPOFOL;  Surgeon: Harvel Quale, MD;  [facility];  Service: Gastroenterology;  Laterality: N/A;   HERNIA REPAIR     x2, bilateral inguinal    KIDNEY STONE SURGERY     KNEE ARTHROSCOPY     X2   NASAL ENDOSCOPY      Prior to Admission medications   Medication Sig Start Date End Date Taking? Authorizing Provider  amLODipine (NORVASC) 10 MG tablet Take 0.5 tablets (5 mg total) by mouth daily. 12/21/20 01/20/21 Yes Lindell Spar, MD  Ascorbic Acid (VITAMIN C) 1000 MG tablet Take 1,000 mg by mouth daily.   Yes [provider]  benazepril (LOTENSIN) 40 MG tablet Take 1 tablet (40 mg total) by mouth daily. 08/24/20  Yes Ailene Ards, NP  Cholecalciferol (VITAMIN D-3) 1000 units CAPS Take 2,000 Units by mouth.   Yes [provider]  clotrimazole-betamethasone (LOTRISONE) cream Apply 1 application topically 2 (two) times daily. 10/26/20  Yes McKenzie, Candee Furbish, MD  furosemide (LASIX) 20 MG tablet Take 1 tablet (20 mg total) by mouth daily. Take 20 mg daily as needed for leg swelling 11/08/20  Yes Noreene Larsson, NP  Garlic 7494 MG CAPS Take 1,000 mg by mouth daily.   Yes [provider]  pantoprazole (PROTONIX) 40 MG tablet Take 1 tablet (40 mg total) by mouth 2 (two) times daily. 11/08/20 12/27/20 Yes Noreene Larsson, NP  potassium chloride (KLOR-CON) 10 MEQ tablet Take 1 tablet (10 mEq total) by mouth daily.  11/08/20  Yes Noreene Larsson, NP  Probiotic Product (DIGESTIVE ADVANTAGE PO) Take by mouth 2 (two) times daily.    Yes [provider]  propranolol (INDERAL) 20 MG tablet Take 1 tablet (20 mg total) by mouth daily. 10/19/20  Yes Noreene Larsson, NP  rosuvastatin (CRESTOR) 10 MG tablet Take 1 tablet (10 mg total) by mouth daily. 08/23/20  Yes Noreene Larsson, NP  tadalafil (CIALIS) 5 MG tablet TAKE 1 TABLET (5 MG TOTAL) BY MOUTH DAILY. 06/06/20 12/27/20 Yes Noreene Larsson, NP  warfarin (COUMADIN) 5 MG tablet Take 1 tablet (5 mg total) by mouth daily. Patient taking differently: Take by mouth. As directed by coumadin clinic 12/02/20  Yes Barton Dubois, MD    Allergies as of 12/27/2020 - Review Complete 12/27/2020  Allergen Reaction Noted   Latex Rash 10/04/2011    Family History  Problem Relation Age of Onset   Breast cancer Mother    Other Mother        Essential tremor   Stroke Mother    Heart disease Father        Pacemaker   Heart attack Maternal Grandfather    Renal cancer Son    Colon cancer Neg Hx    Liver disease Neg Hx    Prostate cancer Neg Hx    Bladder Cancer Neg Hx    Kidney cancer Neg Hx     Social History   Socioeconomic History   Marital status: Married    Spouse name: Not on file   Number of children: 3   Years of education: Not on file   Highest education level: Not on file  Occupational History    Employer: RETIRED   Occupation: Retired- Radiation protection practitioner and farming  Tobacco Use   Smoking status: Former    Packs/day: 0.50    Types: Cigarettes    Quit date: 1992    Years since quitting: 30.9   Smokeless tobacco: Never  Vaping Use   Vaping Use: Never used  Substance and Sexual Activity   Alcohol use: No   Drug use: No   Sexual activity: Not Currently  Other Topics Concern   Not on file  Social History Narrative   Married for 68 years.Retired.No longer in Assisted Living, he came home after wife went to memory care. [place] for memory care as of Dec 2021.   Social Determinants of Health   Financial Resource Strain: Not on file  Food Insecurity: Not on file  Transportation Needs: Not on file  Physical Activity: Not on file  Stress: Not on file  Social Connections: Not on file  Intimate Partner Violence: Not on file    Review of Systems: See HPI, otherwise negative ROS  Physical Exam: BP 108/64   Pulse 69   Temp 97.6 F (36.4 C)   Ht 5\' 9"  (1.753 m)   Wt 217 lb 6.4 oz (98.6 kg)  BMI 32.10 kg/m  General:   Alert, frail pleasant and cooperative in NAD Neck:  Supple; no masses or thyromegaly. No significant cervical adenopathy. Lungs:  Clear throughout to auscultation.   No wheezes, crackles, or rhonchi. No acute distress. Heart:  Regular rate and rhythm; no murmurs, clicks, rubs,  or gallops. Abdomen: Non-distended, normal bowel sounds.  Soft and nontender without appreciable mass or hepatosplenomegaly.  Pulses:  Normal pulses noted. Extremities:  Without clubbing or edema.  Impression/Plan: 87 year old gentleman chronically anticoagulated due to atrial fibrillation with recent life-threatening multiunit lower GI bleed secondary to diverticular etiology.  Status post successful bleeding control therapy at colonoscopy. Has persisting small adenomas not manipulated (appropriately) recently.  Given his age and multiple comorbidities, it would be reasonable not to pursue a follow-up colonoscopy with polypectomy given their  relative diminutive size.  GERD-well-controlled on PPI.  Recommendations:  We will retrieve blood work to be obtained next week to see where hemoglobin stands  There is no way to prevent a future episode of bleeding on Coumadin.  However, hopefully we will not see this happen.  Persisting colon polyps.  They are small.  As discussed, we could either take them out at some point in the future or just follow you conservatively.  There is no rush on making this  decision.  We will plan to see you back in the office in 4 months and as needed  For indigestion/acid reflux, continue Protonix daily-40 mg once daily should suffice.  Office visit here in 4 months.   Notice: This dictation was prepared with Dragon dictation along with smaller phrase technology. Any transcriptional errors that result from this process are unintentional and may not be corrected upon review.

## 2020-12-28 ENCOUNTER — Ambulatory Visit: Payer: Medicare PPO | Admitting: Nurse Practitioner

## 2020-12-28 NOTE — Telephone Encounter (Signed)
The labs should be in the [facility] portal no matter what location they go they should be able to pull the orders up.

## 2021-01-02 ENCOUNTER — Other Ambulatory Visit: Payer: Self-pay

## 2021-01-02 ENCOUNTER — Ambulatory Visit (INDEPENDENT_AMBULATORY_CARE_PROVIDER_SITE_OTHER): Payer: Medicare PPO

## 2021-01-02 DIAGNOSIS — R339 Retention of urine, unspecified: Secondary | ICD-10-CM

## 2021-01-02 NOTE — Progress Notes (Signed)
Cath Change/ Replacement  Patient is present today for a catheter change due to urinary retention.  10ml of water was removed from the balloon, a 16FR foley cath was removed with out difficulty.  Patient was cleaned and prepped in a sterile fashion with betadine. A 16 FR foley cath was replaced into the bladder no complications were noted Urine return was noted 20ml and urine was yellow in color. The balloon was filled with 10ml of sterile water. A leg bag was attached for drainage.  A night bag was also given to the patient and patient was given instruction on how to change from one bag to another. Patient was given proper instruction on catheter care.    Performed by: Kambre Messner RN  Follow up: 1 month NV cath change  

## 2021-01-05 ENCOUNTER — Other Ambulatory Visit (INDEPENDENT_AMBULATORY_CARE_PROVIDER_SITE_OTHER): Payer: Self-pay | Admitting: Nurse Practitioner

## 2021-01-06 ENCOUNTER — Encounter: Payer: Self-pay | Admitting: Nurse Practitioner

## 2021-01-06 ENCOUNTER — Other Ambulatory Visit: Payer: Self-pay

## 2021-01-06 ENCOUNTER — Ambulatory Visit: Payer: Medicare PPO | Admitting: Nurse Practitioner

## 2021-01-06 VITALS — BP 147/80 | HR 65 | Ht 69.0 in | Wt 216.1 lb

## 2021-01-06 DIAGNOSIS — J3489 Other specified disorders of nose and nasal sinuses: Secondary | ICD-10-CM

## 2021-01-06 DIAGNOSIS — I10 Essential (primary) hypertension: Secondary | ICD-10-CM | POA: Diagnosis not present

## 2021-01-06 DIAGNOSIS — E785 Hyperlipidemia, unspecified: Secondary | ICD-10-CM

## 2021-01-06 DIAGNOSIS — I4891 Unspecified atrial fibrillation: Secondary | ICD-10-CM | POA: Diagnosis not present

## 2021-01-06 DIAGNOSIS — K625 Hemorrhage of anus and rectum: Secondary | ICD-10-CM

## 2021-01-06 DIAGNOSIS — N182 Chronic kidney disease, stage 2 (mild): Secondary | ICD-10-CM | POA: Diagnosis not present

## 2021-01-06 MED ORDER — AMLODIPINE BESYLATE 5 MG PO TABS: 5.0000 mg | ORAL_TABLET | Freq: Every day | ORAL | 3 refills | Status: DC

## 2021-01-06 NOTE — Assessment & Plan Note (Signed)
-  check metabolic panel with labs

## 2021-01-06 NOTE — Assessment & Plan Note (Signed)
-  only in AM -he will try nasacort -if that doesn't help will try azelastine -no cough, fever, sinus pressure, so not likely infectious

## 2021-01-06 NOTE — Assessment & Plan Note (Signed)
-  lipid panel ordered

## 2021-01-06 NOTE — Assessment & Plan Note (Signed)
BP Readings from Last 3 Encounters:  01/06/21 (!) 147/80  12/27/20 108/64  12/01/20 (!) 146/84   -BP stable now; refilled amlodipine as above

## 2021-01-06 NOTE — Patient Instructions (Signed)
Please have labs drawn today

## 2021-01-06 NOTE — Assessment & Plan Note (Signed)
-  no current bleeding -will check CBC

## 2021-01-06 NOTE — Assessment & Plan Note (Signed)
-  Rx. Amlodipine; was taking 10 mg dose, but this was decreased during hospitalization; sent in 5 mg dose

## 2021-01-06 NOTE — Progress Notes (Signed)
Acute Office Visit  Subjective:    Patient ID: Tristan Lopez, male    DOB: Mar 23, 1930, 87 y.o.   MRN: [id number]  Chief Complaint  Patient presents with   Follow-up    Follow up needs hemoglobin checked     HPI Patient is in today for lab follow-up for HTN, CKD, HLD, and hypokalemia.  He had recent hospitalization for GI bleed, but he states he has had no further bleeding. Past Medical History:  Diagnosis Date   Abdominal aortic aneurysm without rupture    Bilateral renal cysts    BPH (benign prostatic hyperplasia)    Carotid artery occlusion    Chronic atrial fibrillation (HCC)    COPD (chronic obstructive pulmonary disease) ([place])    Diverticulosis    Pancolonic   Essential hypertension    Hemorrhoids    Hiatal hernia    Hx of adenomatous colonic polyps    Hypercholesteremia    Liver cyst    Benign by liver biopsy September 2013   Nephrolithiasis    Nephrolithiasis    Pre-diabetes    Reflux esophagitis    Sleep apnea    Uses CPAP   Tubular adenoma     Past Surgical History:  Procedure Laterality Date   COLONOSCOPY     2003, no polyps per patient. Dr. West Carbo   COLONOSCOPY  11/01/11   Dr. Gala Romney- tubular adenoma,suboptimal preparation, internal hemorrhoids, o/w normal rectum. pancolonic diverticulosis   COLONOSCOPY WITH PROPOFOL N/A 11/29/2020   Procedure: COLONOSCOPY WITH PROPOFOL;  Surgeon: Harvel Quale, MD;  [facility];  Service: Gastroenterology;  Laterality: N/A;   ESOPHAGOGASTRODUODENOSCOPY  11/01/11   Dr. Gala Romney- erosive reflux esophagitis along with a patulous EG junction, hialtal hernia, antral erosions with possible area of healing ulceration- s/p bx= chronic erosive gastritis, no malignancy   ESOPHAGOGASTRODUODENOSCOPY (EGD) WITH PROPOFOL N/A 11/29/2020   Procedure: ESOPHAGOGASTRODUODENOSCOPY (EGD) WITH PROPOFOL;  Surgeon: Harvel Quale, MD;  [facility];  Service: Gastroenterology;  Laterality: N/A;    HERNIA REPAIR     x2, bilateral inguinal    KIDNEY STONE SURGERY     KNEE ARTHROSCOPY     X2   NASAL ENDOSCOPY      Family History  Problem Relation Age of Onset   Breast cancer Mother    Other Mother        Essential tremor   Stroke Mother    Heart disease Father        Pacemaker   Heart attack Maternal Grandfather    Renal cancer Son    Colon cancer Neg Hx    Liver disease Neg Hx    Prostate cancer Neg Hx    Bladder Cancer Neg Hx    Kidney cancer Neg Hx     Social History   Socioeconomic History   Marital status: Married    Spouse name: Not on file   Number of children: 3   Years of education: Not on file   Highest education level: Not on file  Occupational History    Employer: RETIRED   Occupation: Retired- Radiation protection practitioner and farming  Tobacco Use   Smoking status: Former    Packs/day: 0.50    Types: Cigarettes    Quit date: 1992    Years since quitting: 30.9   Smokeless tobacco: Never  Vaping Use   Vaping Use: Never used  Substance and Sexual Activity   Alcohol use: No   Drug use: No   Sexual activity:  Not Currently  Other Topics Concern   Not on file  Social History Narrative   Married for 68 years.Retired.No longer in Assisted Living, he came home after wife went to memory care. [place] for memory care as of Dec 2021.   Social Determinants of Health   Financial Resource Strain: Not on file  Food Insecurity: Not on file  Transportation Needs: Not on file  Physical Activity: Not on file  Stress: Not on file  Social Connections: Not on file  Intimate Partner Violence: Not on file    Outpatient Medications Prior to Visit  Medication Sig Dispense Refill   Ascorbic Acid (VITAMIN C) 1000 MG tablet Take 1,000 mg by mouth daily.     benazepril (LOTENSIN) 40 MG tablet Take 1 tablet (40 mg total) by mouth daily. 31 tablet 3   Cholecalciferol (VITAMIN D-3) 1000 units CAPS Take 2,000 Units by mouth.     furosemide (LASIX)  20 MG tablet Take 1 tablet (20 mg total) by mouth daily. Take 20 mg daily as needed for leg swelling 90 tablet 1   Garlic 2426 MG CAPS Take 1,000 mg by mouth daily.     potassium chloride (KLOR-CON) 10 MEQ tablet Take 1 tablet (10 mEq total) by mouth daily. 90 tablet 0   Probiotic Product (DIGESTIVE ADVANTAGE PO) Take by mouth 2 (two) times daily.      propranolol (INDERAL) 20 MG tablet Take 1 tablet (20 mg total) by mouth daily. 31 tablet 4   rosuvastatin (CRESTOR) 10 MG tablet Take 1 tablet (10 mg total) by mouth daily. 90 tablet 1   tadalafil (CIALIS) 5 MG tablet Take 1 tablet (5 mg total) by mouth daily. 30 tablet 5   warfarin (COUMADIN) 5 MG tablet Take 1 tablet (5 mg total) by mouth daily. (Patient taking differently: Take by mouth. As directed by coumadin clinic)     amLODipine (NORVASC) 10 MG tablet Take 0.5 tablets (5 mg total) by mouth daily. 15 tablet 0   pantoprazole (PROTONIX) 40 MG tablet Take 1 tablet (40 mg total) by mouth 2 (two) times daily. 62 tablet 6   clotrimazole-betamethasone (LOTRISONE) cream Apply 1 application topically 2 (two) times daily. 30 g 1   No facility-administered medications prior to visit.    Allergies  Allergen Reactions   Latex Rash    Review of Systems  Constitutional: Negative.   HENT:  Positive for congestion.        Ongoing for several weeks; occurs in the AM and goes away over the course of the day  Respiratory: Negative.    Cardiovascular: Negative.   Musculoskeletal: Negative.   Psychiatric/Behavioral: Negative.        Objective:    Physical Exam Constitutional:      Appearance: Normal appearance.  Cardiovascular:     Rate and Rhythm: Normal rate. Rhythm irregular.     Pulses: Normal pulses.     Heart sounds: Normal heart sounds.  Pulmonary:     Effort: Pulmonary effort is normal.     Breath sounds: Normal breath sounds.  Musculoskeletal:     Comments: Uses cane for ambulation  Neurological:     Mental Status: He is alert.   Psychiatric:        Mood and Affect: Mood normal.        Behavior: Behavior normal.        Thought Content: Thought content normal.        Judgment: Judgment normal.  BP (!) 147/80    Pulse 65    Ht _0  (1.753 m)    Wt 216 lb 1.9 oz (98 kg)    SpO2 98%    BMI 31.92 kg/m  Wt Readings from Last 3 Encounters:  01/06/21 216 lb 1.9 oz (98 kg)  12/27/20 217 lb 6.4 oz (98.6 kg)  11/29/20 225 lb (102.1 kg)    Health Maintenance Due  Topic Date Due   TETANUS/TDAP  Never done   COVID-19 Vaccine (3 - Mixed Product risk series) 05/26/2020    There are no preventive care reminders to display for this patient.   No results found for: TSH Lab Results  Component Value Date   WBC 5.5 12/01/2020   HGB 9.7 (L) 12/01/2020   HCT 29.5 (L) 12/01/2020   MCV 93.9 12/01/2020   PLT 150 12/01/2020   Lab Results  Component Value Date   NA 137 11/30/2020   K 3.9 11/30/2020   CO2 24 11/30/2020   GLUCOSE 103 (H) 11/30/2020   BUN 12 11/30/2020   CREATININE 0.78 11/30/2020   BILITOT 1.5 (H) 11/30/2020   ALKPHOS 39 11/30/2020   AST 19 11/30/2020   ALT 12 11/30/2020   PROT 5.3 (L) 11/30/2020   ALBUMIN 3.0 (L) 11/30/2020   CALCIUM 8.2 (L) 11/30/2020   ANIONGAP 9 11/30/2020   Lab Results  Component Value Date   CHOL 143 07/28/2019   Lab Results  Component Value Date   HDL 42 07/28/2019   Lab Results  Component Value Date   LDLCALC 84 07/28/2019   Lab Results  Component Value Date   TRIG 78 07/28/2019   Lab Results  Component Value Date   CHOLHDL 3.4 07/28/2019   No results found for: HGBA1C     Assessment & Plan:   Problem List Items Addressed This Visit       Cardiovascular and Mediastinum   Atrial fibrillation ([place]) - Primary    -Rx. Amlodipine; was taking 10 mg dose, but this was decreased during hospitalization; sent in 5 mg dose      Relevant Medications   amLODipine (NORVASC) 5 MG tablet   Essential hypertension    BP Readings from Last 3 Encounters:   01/06/21 (!) 147/80  12/27/20 108/64  12/01/20 (!) 146/84  -BP stable now; refilled amlodipine as above      Relevant Medications   amLODipine (NORVASC) 5 MG tablet   Other Relevant Orders   CBC with Differential/Platelet   CMP14+EGFR   Lipid Panel With LDL/HDL Ratio     Digestive   Rectal bleeding    -no current bleeding -will check CBC      Relevant Orders   CBC with Differential/Platelet     Genitourinary   Chronic kidney disease, stage 2 (mild)    -check metabolic panel with labs      Relevant Orders   CMP14+EGFR     Other   Hyperlipidemia    -lipid panel ordered      Relevant Medications   amLODipine (NORVASC) 5 MG tablet   Rhinorrhea    -only in AM -he will try nasacort -if that doesn't help will try azelastine -no cough, fever, sinus pressure, so not likely infectious        Meds ordered this encounter  Medications   amLODipine (NORVASC) 5 MG tablet    Sig: Take 1 tablet (5 mg total) by mouth daily.    Dispense:  90 tablet    Refill:  3  Noreene Larsson, NP

## 2021-01-07 LAB — CBC WITH DIFFERENTIAL/PLATELET
Basophils Absolute: 0 10*3/uL (ref 0.0–0.2)
Basos: 0 %
EOS (ABSOLUTE): 0.1 10*3/uL (ref 0.0–0.4)
Eos: 1 %
Hematocrit: 35.7 % — ABNORMAL LOW (ref 37.5–51.0)
Hemoglobin: 11.4 g/dL — ABNORMAL LOW (ref 13.0–17.7)
Immature Grans (Abs): 0 10*3/uL (ref 0.0–0.1)
Immature Granulocytes: 0 %
Lymphocytes Absolute: 1.6 10*3/uL (ref 0.7–3.1)
Lymphs: 35 %
MCH: 29.5 pg (ref 26.6–33.0)
MCHC: 31.9 g/dL (ref 31.5–35.7)
MCV: 92 fL (ref 79–97)
Monocytes Absolute: 0.7 10*3/uL (ref 0.1–0.9)
Monocytes: 15 %
Neutrophils Absolute: 2.2 10*3/uL (ref 1.4–7.0)
Neutrophils: 49 %
Platelets: 204 10*3/uL (ref 150–450)
RBC: 3.87 x10E6/uL — ABNORMAL LOW (ref 4.14–5.80)
RDW: 13.2 % (ref 11.6–15.4)
WBC: 4.6 10*3/uL (ref 3.4–10.8)

## 2021-01-07 LAB — CMP14+EGFR
ALT: 9 IU/L (ref 0–44)
AST: 18 IU/L (ref 0–40)
Albumin/Globulin Ratio: 1.9 (ref 1.2–2.2)
Albumin: 4.1 g/dL (ref 3.6–4.6)
Alkaline Phosphatase: 65 IU/L (ref 44–121)
BUN/Creatinine Ratio: 25 — ABNORMAL HIGH (ref 10–24)
BUN: 20 mg/dL (ref 8–27)
Bilirubin Total: 0.5 mg/dL (ref 0.0–1.2)
CO2: 26 mmol/L (ref 20–29)
Calcium: 9.3 mg/dL (ref 8.6–10.2)
Chloride: 104 mmol/L (ref 96–106)
Creatinine, Ser: 0.8 mg/dL (ref 0.76–1.27)
Globulin, Total: 2.2 g/dL (ref 1.5–4.5)
Glucose: 107 mg/dL — ABNORMAL HIGH (ref 70–99)
Potassium: 4.7 mmol/L (ref 3.5–5.2)
Sodium: 141 mmol/L (ref 134–144)
Total Protein: 6.3 g/dL (ref 6.0–8.5)
eGFR: 85 mL/min/{1.73_m2} (ref >59)

## 2021-01-07 LAB — LIPID PANEL WITH LDL/HDL RATIO
Cholesterol, Total: 130 mg/dL (ref 100–199)
HDL: 49 mg/dL (ref >39)
LDL Chol Calc (NIH): 69 mg/dL (ref 0–99)
LDL/HDL Ratio: 1.4 ratio (ref 0.0–3.6)
Triglycerides: 56 mg/dL (ref 0–149)
VLDL Cholesterol Cal: 12 mg/dL (ref 5–40)

## 2021-01-09 NOTE — Progress Notes (Signed)
RBC is improving, and labs look good overall. Glucose is slightly elevated, so I'll add an A1c to your labs.

## 2021-01-10 LAB — SPECIMEN STATUS REPORT

## 2021-01-10 LAB — HGB A1C W/O EAG: Hgb A1c MFr Bld: 5.2 % (ref 4.8–5.6)

## 2021-01-10 NOTE — Progress Notes (Signed)
A1c is great at 5.2%, so he isn't even prediabetic. In the normal range!

## 2021-01-20 ENCOUNTER — Other Ambulatory Visit: Payer: Self-pay

## 2021-01-20 ENCOUNTER — Telehealth: Payer: Self-pay | Admitting: Nurse Practitioner

## 2021-01-20 DIAGNOSIS — I4891 Unspecified atrial fibrillation: Secondary | ICD-10-CM

## 2021-01-20 MED ORDER — AMLODIPINE BESYLATE 5 MG PO TABS: 5.0000 mg | ORAL_TABLET | Freq: Every day | ORAL | 3 refills | Status: DC

## 2021-01-20 NOTE — Telephone Encounter (Signed)
Re-sent to pharmacy.

## 2021-01-20 NOTE — Telephone Encounter (Signed)
Per Glendell Docker , the amlodipine was changed to 5 mg,. But was never sent to the Pharmacy --please correct  He is at the pharmacy --[facility]

## 2021-02-01 ENCOUNTER — Other Ambulatory Visit: Payer: Self-pay | Admitting: Cardiology

## 2021-02-02 ENCOUNTER — Ambulatory Visit (INDEPENDENT_AMBULATORY_CARE_PROVIDER_SITE_OTHER): Payer: Medicare PPO | Admitting: *Deleted

## 2021-02-02 DIAGNOSIS — I4821 Permanent atrial fibrillation: Secondary | ICD-10-CM

## 2021-02-02 DIAGNOSIS — Z5181 Encounter for therapeutic drug level monitoring: Secondary | ICD-10-CM

## 2021-02-02 DIAGNOSIS — I4891 Unspecified atrial fibrillation: Secondary | ICD-10-CM

## 2021-02-02 LAB — PROTIME-INR
INR: 2.2 — ABNORMAL HIGH (ref 0.9–1.2)
Prothrombin Time: 21.9 s — ABNORMAL HIGH (ref 9.1–12.0)

## 2021-02-02 NOTE — Patient Instructions (Signed)
Pt is in independent living facility in [place].  Gets labs done at [facility] in [place].  Son Glendell Docker manages pts medicine.  Lab orders were mailed to [place] address. Continue warfarin 5 mg daily except 2.5mg  on Sundays, Tuesdays and Thursdays Recheck wk of 03/13/21. Called and spoke with Glendell Docker (son).  Coumadin instructions given and he verbalized understanding.

## 2021-02-06 ENCOUNTER — Other Ambulatory Visit (INDEPENDENT_AMBULATORY_CARE_PROVIDER_SITE_OTHER): Payer: Self-pay | Admitting: Nurse Practitioner

## 2021-02-07 ENCOUNTER — Ambulatory Visit: Payer: Medicare PPO

## 2021-02-07 ENCOUNTER — Other Ambulatory Visit: Payer: Self-pay | Admitting: *Deleted

## 2021-02-07 DIAGNOSIS — I10 Essential (primary) hypertension: Secondary | ICD-10-CM

## 2021-02-07 MED ORDER — POTASSIUM CHLORIDE ER 10 MEQ PO TBCR: 10.0000 meq | EXTENDED_RELEASE_TABLET | Freq: Every day | ORAL | 0 refills | Status: DC

## 2021-02-09 ENCOUNTER — Other Ambulatory Visit: Payer: Self-pay

## 2021-02-09 ENCOUNTER — Ambulatory Visit (INDEPENDENT_AMBULATORY_CARE_PROVIDER_SITE_OTHER): Payer: Medicare PPO

## 2021-02-09 DIAGNOSIS — R339 Retention of urine, unspecified: Secondary | ICD-10-CM

## 2021-02-09 NOTE — Patient Instructions (Signed)

## 2021-02-09 NOTE — Progress Notes (Signed)
Cath Change/ Replacement  Patient is present today for a catheter change due to urinary retention.  91ml of water was removed from the balloon, a 16FR foley cath was removed with out difficulty.  Patient was cleaned and prepped in a sterile fashion with betadine. A 16 FR foley cath was replaced into the bladder no complications were noted Urine return was noted 50ml and urine was yellow in color. The balloon was filled with 84ml of sterile water. A leg bag was attached for drainage.  A night bag was also given to the patient and patient was given instruction on how to change from one bag to another. Patient was given proper instruction on catheter care.    Performed by: Kaedan Richert LPN   Follow up: Keep scheduled NV

## 2021-02-17 ENCOUNTER — Encounter: Payer: Self-pay | Admitting: Nurse Practitioner

## 2021-03-10 ENCOUNTER — Emergency Department (HOSPITAL_COMMUNITY)
Admission: EM | Admit: 2021-03-10 | Discharge: 2021-03-10 | Disposition: A | Discharge status: Home / Self Care | Payer: Medicare PPO | Attending: Emergency Medicine | Admitting: Emergency Medicine

## 2021-03-10 ENCOUNTER — Other Ambulatory Visit: Payer: Self-pay

## 2021-03-10 ENCOUNTER — Encounter (HOSPITAL_COMMUNITY): Payer: Self-pay | Admitting: Emergency Medicine

## 2021-03-10 DIAGNOSIS — T83098A Other mechanical complication of other indwelling urethral catheter, initial encounter: Secondary | ICD-10-CM | POA: Diagnosis not present

## 2021-03-10 DIAGNOSIS — K5900 Constipation, unspecified: Secondary | ICD-10-CM | POA: Diagnosis not present

## 2021-03-10 DIAGNOSIS — R109 Unspecified abdominal pain: Secondary | ICD-10-CM | POA: Diagnosis not present

## 2021-03-10 DIAGNOSIS — R339 Retention of urine, unspecified: Secondary | ICD-10-CM | POA: Diagnosis not present

## 2021-03-10 DIAGNOSIS — T83091A Other mechanical complication of indwelling urethral catheter, initial encounter: Secondary | ICD-10-CM

## 2021-03-10 LAB — URINALYSIS, ROUTINE W REFLEX MICROSCOPIC
Bilirubin Urine: NEGATIVE
Glucose, UA: NEGATIVE mg/dL
Hgb urine dipstick: NEGATIVE
Ketones, ur: 5 mg/dL — AB
Nitrite: NEGATIVE
Protein, ur: 100 mg/dL — AB
RBC / HPF: >50 RBC/hpf — ABNORMAL HIGH (ref 0–5)
Specific Gravity, Urine: 1.016 (ref 1.005–1.030)
WBC, UA: >50 WBC/hpf — ABNORMAL HIGH (ref 0–5)
pH: 8 (ref 5.0–8.0)

## 2021-03-10 NOTE — Discharge Instructions (Signed)
Call your primary care doctor or specialist as discussed in the next 2-3 days.   Return immediately back to the ER if:  Your symptoms worsen within the next 12-24 hours. You develop new symptoms such as new fevers, persistent vomiting, new pain, shortness of breath, or new weakness or numbness, or if you have any other concerns.  

## 2021-03-10 NOTE — ED Provider Notes (Signed)
[facility] EMERGENCY DEPARTMENT Provider Note   CSN: [id number] Arrival date & time: 03/10/21  0854     History  Chief Complaint  Patient presents with   Urinary Retention    Tristan Lopez is a 88 y.o. male.  AlsoPatient presents chief complaint of abdominal fullness sensation and inability to urinate.  He has had an chronic indwelling Foley catheter that he gets changed every month or so with urology.  His appointment next week to get a change.  However overnight he states the bag is remained empty.  All morning is remained empty as well he has a urge to urinate but is unable to do so.  No reports of fevers no cough no vomiting no diarrhea.  Mentions some constipation, however family states he had a normal bowel movement yesterday.      Home Medications Prior to Admission medications   Medication Sig Start Date End Date Taking? Authorizing Provider  amLODipine (NORVASC) 5 MG tablet Take 1 tablet (5 mg total) by mouth daily. 01/20/21  Yes Noreene Larsson, NP  Ascorbic Acid (VITAMIN C) 1000 MG tablet Take 1,000 mg by mouth daily.   Yes [provider]  benazepril (LOTENSIN) 40 MG tablet Take 1 tablet (40 mg total) by mouth daily. 08/24/20  Yes Ailene Ards, NP  Cholecalciferol (VITAMIN D-3) 1000 units CAPS Take 2,000 Units by mouth daily.   Yes [provider]  furosemide (LASIX) 20 MG tablet Take 1 tablet (20 mg total) by mouth daily. Take 20 mg daily as needed for leg swelling 11/08/20  Yes Noreene Larsson, NP  Garlic 7989 MG CAPS Take 1,000 mg by mouth daily.   Yes [provider]  pantoprazole (PROTONIX) 40 MG tablet Take 1 tablet (40 mg total) by mouth 2 (two) times daily. 11/08/20 03/10/21 Yes Noreene Larsson, NP  potassium chloride (KLOR-CON) 10 MEQ tablet Take 1 tablet (10 mEq total) by mouth daily. 02/07/21  Yes Paseda, Dewaine Conger, FNP  Probiotic Product (DIGESTIVE ADVANTAGE PO) Take by mouth 2 (two) times daily.    Yes [provider]   propranolol (INDERAL) 20 MG tablet Take 1 tablet (20 mg total) by mouth daily. 10/19/20  Yes Noreene Larsson, NP  rosuvastatin (CRESTOR) 10 MG tablet Take 1 tablet (10 mg total) by mouth daily. 08/23/20  Yes Noreene Larsson, NP  tadalafil (CIALIS) 5 MG tablet Take 1 tablet (5 mg total) by mouth daily. 01/05/21  Yes Noreene Larsson, NP  warfarin (COUMADIN) 5 MG tablet Take 1 tablet (5 mg total) by mouth daily. Patient taking differently: Take 2.5-5 mg by mouth. As directed by coumadin clinic 12/02/20  Yes Barton Dubois, MD      Allergies    Latex    Review of Systems   Review of Systems  Constitutional:  Negative for fever.  HENT:  Negative for ear pain and sore throat.   Eyes:  Negative for pain.  Respiratory:  Negative for cough.   Cardiovascular:  Negative for chest pain.  Gastrointestinal:  Positive for abdominal pain.  Genitourinary:  Negative for flank pain.  Musculoskeletal:  Negative for back pain.  Skin:  Negative for color change and rash.  Neurological:  Negative for syncope.  All other systems reviewed and are negative.  Physical Exam Updated Vital Signs BP (!) 157/81    Pulse 89    Temp 98 F (36.7 C) (Oral)    Resp 17    Ht 5\' 9"  (1.753 m)  Wt 98 kg    SpO2 100%    BMI 31.91 kg/m  Physical Exam Constitutional:      Appearance: He is well-developed.  HENT:     Head: Normocephalic.     Nose: Nose normal.  Eyes:     Extraocular Movements: Extraocular movements intact.  Cardiovascular:     Rate and Rhythm: Normal rate.  Pulmonary:     Effort: Pulmonary effort is normal.  Abdominal:     Comments: Moderate suprapubic tenderness.  No guarding or rebound.  Skin:    Coloration: Skin is not jaundiced.  Neurological:     Mental Status: He is alert. Mental status is at baseline.    ED Results / Procedures / Treatments   Labs (all labs ordered are listed, but only abnormal results are displayed) Labs Reviewed - No data to display  EKG None  Radiology No  results found.  Procedures Procedures    Medications Ordered in ED Medications - No data to display  ED Course/ Medical Decision Making/ A&P                           Medical Decision Making  Son at bedside providing additional history.  Review of records shows office visit January 06, 2021 for atrial fibrillation.  No additional labs were performed here in the ER.  Foley catheter was changed and new catheter appears to be flowing well.  Approximately 500 cc of urine output noted yellow appearing.  Patient states that he feels much better.  Discharged home in stable condition, advised outpatient follow-up with his urologist within the week.  Advised return if he is having recurrent difficulty fevers pain or any additional concerns.        Final Clinical Impression(s) / ED Diagnoses Final diagnoses:  Obstruction of Foley catheter, initial encounter [facility])    Rx / [place] Orders ED Discharge Orders     None         Luna Fuse, MD 03/10/21 1106

## 2021-03-10 NOTE — ED Triage Notes (Signed)
Pt to the ED with complaints of urinary retention  with a catheter in place. Pt is also complaining of constipation.  Pt has a urology appt on Tuesday of next week to get the catheter changed as he does once a month.

## 2021-03-13 ENCOUNTER — Encounter: Payer: Self-pay | Admitting: Nurse Practitioner

## 2021-03-13 ENCOUNTER — Ambulatory Visit: Payer: Medicare PPO | Admitting: Nurse Practitioner

## 2021-03-13 ENCOUNTER — Other Ambulatory Visit: Payer: Self-pay

## 2021-03-13 ENCOUNTER — Ambulatory Visit: Payer: Medicare PPO

## 2021-03-13 VITALS — BP 121/63 | HR 72 | Ht 69.0 in | Wt 169.0 lb

## 2021-03-13 DIAGNOSIS — I4891 Unspecified atrial fibrillation: Secondary | ICD-10-CM | POA: Diagnosis not present

## 2021-03-13 DIAGNOSIS — I10 Essential (primary) hypertension: Secondary | ICD-10-CM

## 2021-03-13 DIAGNOSIS — R262 Difficulty in walking, not elsewhere classified: Secondary | ICD-10-CM | POA: Diagnosis not present

## 2021-03-13 DIAGNOSIS — N400 Enlarged prostate without lower urinary tract symptoms: Secondary | ICD-10-CM

## 2021-03-13 NOTE — Assessment & Plan Note (Signed)
Condition due to advanced age  .Pt uses a Rolator and cane for ambulation, pt stated that he can not walk very far due to his knees giving out, they would like to have a handicapped parking due to this . Pt currently denies knee pain, hip pain, . Handicapped parking from completed today, Continue to use cane or Rolator for stability  during ambulation to prevent falls.

## 2021-03-13 NOTE — Assessment & Plan Note (Addendum)
notes from ER visit reviewed by me  Has chronic foley catheter for retention Foley catheter changed 3 days ago in the ER Denies pelvic pain, abdominal pain, states that catheter is emptying well.  follow up with urology in 4 weeks for catheter change.  Pt and his son deferred examination of the catheter today.

## 2021-03-13 NOTE — Progress Notes (Signed)
° °  SHAWNDELL SCHILLACI     MRN: [id number]      DOB: September 26, 1930   HPI Mr. Moehring with history of BPH , HTN, Chronic Afib, chronic foley catheter use is here for follow up for ER visit on 03/10/2021 for obstructed catheter, catheter was changed in the ER, he does have catheter changed every month or at the urology office. He was supposed to have a follow up with urology for catheter change on 2/21, urology told them to wait till next month since catheter has been changed in the ED. Pt stated that he is emptying his urine without problem.  Pt denies retention, abdominal pain, pelvic pain.   Has a follow up appointment with cardiology on 2/20 for afib and coumadin management  pt's son want to know if coumadin level can be checked today and result forwarded to cardiology. Pt denies bleeding,bloodystools they usually get labs done at [facility] in [place].   Pt uses a Rolator and cane for ambulation, pt stated that he can not walk very far due to his knees giving out, they would like to have a handicapped parking due to this . Pt currently denies knee pain, hip pain, .    ROS Denies recent fever or chills. Denies sinus pressure, nasal congestion, ear pain or sore throat. Denies chest congestion, productive cough or wheezing. Denies chest pains, palpitations and leg swelling Denies abdominal pain, nausea, vomiting,diarrhea or constipation.   Denies dysuria, frequency, hesitancy or incontinence. Denies joint pain, swelling and limitation in mobility. Denies headaches, seizures, numbness, or tingling.    PE  BP 121/63 (BP Location: Left Arm, Patient Position: Sitting, Cuff Size: Large)    Pulse 72    Ht 5\' 9"  (1.753 m)    Wt 169 lb (76.7 kg)    SpO2 98%    BMI 24.96 kg/m   Patient alert and oriented and in no cardiopulmonary distress.    Chest: Clear to auscultation bilaterally.  CVS: S1, S2 no murmurs, no S3.Regular rate.  ABD: Soft non tender.   Ext: No edema  MS: reduced  ROM spine,  shoulders, hips and knees using a cane   Skin: Intact, no ulcerations or rash noted.  Psych: Good eye contact, normal affect. Memory intact not anxious or depressed appearing.  CNS: CN 2-12 intact, power,  normal throughout.no focal deficits noted. Pt and his sone deferred examination of urogenital area today     Assessment & Plan

## 2021-03-13 NOTE — Patient Instructions (Signed)
Please get your PT/INR done today.

## 2021-03-13 NOTE — Assessment & Plan Note (Addendum)
Takes propanolol 20mg  daily,  coumadin 2-5mg  daily as directed by cardiology. Continue current meds  Check PT/INR today at the request of pt's son , will send results to cardiology when results is out, follow up with cardiology on 2/21. Pt denies rectal bleed, bloody in the stool.

## 2021-03-13 NOTE — Assessment & Plan Note (Signed)
BP Readings from Last 3 Encounters:  03/13/21 121/63  03/10/21 (!) 157/81  01/06/21 (!) 147/80  well controlled on amlodipine 5mg  daily, benazepril 40 mg daily, propanolol 20 mg daily.  Continue current meds, advised on DASH diet, engage in regular exercise as tolerated 30 minutes 5 times a week.

## 2021-03-14 ENCOUNTER — Encounter: Payer: Self-pay | Admitting: Student

## 2021-03-14 ENCOUNTER — Ambulatory Visit: Payer: Medicare PPO | Admitting: Student

## 2021-03-14 ENCOUNTER — Ambulatory Visit: Payer: Medicare PPO

## 2021-03-14 ENCOUNTER — Ambulatory Visit (INDEPENDENT_AMBULATORY_CARE_PROVIDER_SITE_OTHER): Payer: Medicare PPO | Admitting: *Deleted

## 2021-03-14 VITALS — BP 112/62 | HR 66 | Ht 69.0 in | Wt 212.0 lb

## 2021-03-14 DIAGNOSIS — I10 Essential (primary) hypertension: Secondary | ICD-10-CM

## 2021-03-14 DIAGNOSIS — I6523 Occlusion and stenosis of bilateral carotid arteries: Secondary | ICD-10-CM

## 2021-03-14 DIAGNOSIS — I4821 Permanent atrial fibrillation: Secondary | ICD-10-CM | POA: Diagnosis not present

## 2021-03-14 DIAGNOSIS — I7143 Infrarenal abdominal aortic aneurysm, without rupture: Secondary | ICD-10-CM

## 2021-03-14 DIAGNOSIS — Z5181 Encounter for therapeutic drug level monitoring: Secondary | ICD-10-CM

## 2021-03-14 DIAGNOSIS — E785 Hyperlipidemia, unspecified: Secondary | ICD-10-CM | POA: Diagnosis not present

## 2021-03-14 LAB — PROTIME-INR
INR: 2.6 — ABNORMAL HIGH (ref 0.9–1.2)
Prothrombin Time: 25.8 s — ABNORMAL HIGH (ref 9.1–12.0)

## 2021-03-14 MED ORDER — ROSUVASTATIN CALCIUM 10 MG PO TABS: 10.0000 mg | ORAL_TABLET | Freq: Every day | ORAL | 11 refills | Status: DC

## 2021-03-14 MED ORDER — PROPRANOLOL HCL 20 MG PO TABS: 20.0000 mg | ORAL_TABLET | Freq: Every day | ORAL | 11 refills | Status: DC

## 2021-03-14 MED ORDER — BENAZEPRIL HCL 40 MG PO TABS: 40.0000 mg | ORAL_TABLET | Freq: Every day | ORAL | 11 refills | Status: DC

## 2021-03-14 NOTE — Progress Notes (Signed)
Cardiology Office Note    Date:  03/14/2021   ID:  Huy, Majid July 02, 1930, MRN [id number]  PCP:  Renee Rival, FNP  Cardiologist: [name], MD    Chief Complaint  Patient presents with   Follow-up    Annual Visit    History of Present Illness:    Tristan Lopez is a 88 y.o. male with past medical history of permanent atrial fibrillation, carotid artery stenosis, HTN, HLD and infrarenal AAA (at 3.5 cm in 2015 and 3.8 cm in 05/2020) who presents to the office today for annual follow-up.  He was last examined by Dr. Domenic Polite in 01/2020 and was residing at an independent living facility at that time. He denied any recent palpitations and was on Propanolol for rate control along with Coumadin for anticoagulation.   He did present to the ED last week for evaluation of urinary retention and his Foley catheter was changed with improvement in symptoms.  In talking with the patient and his son today, he reports things have been "stable" since his last office visit. He does live by himself but family members live close by and check on him several times a day and prepare most of his meals. He does climb the stairs to his basement to do his own laundry. Reports that his breathing has been stable and denies any new dyspnea on exertion, orthopnea, PND or lower extremity edema. No recent chest pain or palpitations. He did have diverticular bleeding in 11/2020 but denies any recurrent melena or hematochezia.  Past Medical History:  Diagnosis Date   Abdominal aortic aneurysm without rupture    Bilateral renal cysts    BPH (benign prostatic hyperplasia)    Carotid artery occlusion    Chronic atrial fibrillation (HCC)    COPD (chronic obstructive pulmonary disease) ([place])    Diverticulosis    Pancolonic   Essential hypertension    Hemorrhoids    Hiatal hernia    Hx of adenomatous colonic polyps    Hypercholesteremia    Liver cyst    Benign by liver biopsy September  2013   Nephrolithiasis    Nephrolithiasis    Permanent atrial fibrillation ([place])    Pre-diabetes    Reflux esophagitis    Sleep apnea    Uses CPAP   Tubular adenoma     Past Surgical History:  Procedure Laterality Date   COLONOSCOPY     2003, no polyps per patient. Dr. West Carbo   COLONOSCOPY  11/01/11   Dr. Gala Romney- tubular adenoma,suboptimal preparation, internal hemorrhoids, o/w normal rectum. pancolonic diverticulosis   COLONOSCOPY WITH PROPOFOL N/A 11/29/2020   Procedure: COLONOSCOPY WITH PROPOFOL;  Surgeon: Harvel Quale, MD;  Location: [facility];  Service: Gastroenterology;  Laterality: N/A;   ESOPHAGOGASTRODUODENOSCOPY  11/01/11   Dr. Gala Romney- erosive reflux esophagitis along with a patulous EG junction, hialtal hernia, antral erosions with possible area of healing ulceration- s/p bx= chronic erosive gastritis, no malignancy   ESOPHAGOGASTRODUODENOSCOPY (EGD) WITH PROPOFOL N/A 11/29/2020   Procedure: ESOPHAGOGASTRODUODENOSCOPY (EGD) WITH PROPOFOL;  Surgeon: Harvel Quale, MD;  Location: [facility];  Service: Gastroenterology;  Laterality: N/A;   HERNIA REPAIR     x2, bilateral inguinal    KIDNEY STONE SURGERY     KNEE ARTHROSCOPY     X2   NASAL ENDOSCOPY      Current Medications: Outpatient Medications Prior to Visit  Medication Sig Dispense Refill   amLODipine (NORVASC) 5 MG tablet Take 1 tablet (5  mg total) by mouth daily. 90 tablet 3   Ascorbic Acid (VITAMIN C) 1000 MG tablet Take 1,000 mg by mouth daily.     Cholecalciferol (VITAMIN D-3) 1000 units CAPS Take 2,000 Units by mouth daily.     furosemide (LASIX) 20 MG tablet Take 1 tablet (20 mg total) by mouth daily. Take 20 mg daily as needed for leg swelling 90 tablet 1   Garlic 3295 MG CAPS Take 1,000 mg by mouth daily.     pantoprazole (PROTONIX) 40 MG tablet Take 1 tablet (40 mg total) by mouth 2 (two) times daily. 62 tablet 6   potassium chloride (KLOR-CON) 10 MEQ tablet Take 1 tablet  (10 mEq total) by mouth daily. 90 tablet 0   Probiotic Product (DIGESTIVE ADVANTAGE PO) Take by mouth 2 (two) times daily.      tadalafil (CIALIS) 5 MG tablet Take 1 tablet (5 mg total) by mouth daily. 30 tablet 5   warfarin (COUMADIN) 5 MG tablet Take 1 tablet (5 mg total) by mouth daily. (Patient taking differently: Take 2.5-5 mg by mouth. As directed by coumadin clinic)     benazepril (LOTENSIN) 40 MG tablet Take 1 tablet (40 mg total) by mouth daily. 31 tablet 3   propranolol (INDERAL) 20 MG tablet Take 1 tablet (20 mg total) by mouth daily. 31 tablet 4   rosuvastatin (CRESTOR) 10 MG tablet Take 1 tablet (10 mg total) by mouth daily. 90 tablet 1   No facility-administered medications prior to visit.     Allergies:   Latex   Social History   Socioeconomic History   Marital status: Married    Spouse name: Not on file   Number of children: 3   Years of education: Not on file   Highest education level: Not on file  Occupational History    Employer: RETIRED   Occupation: Retired- Radiation protection practitioner and farming  Tobacco Use   Smoking status: Former    Packs/day: 0.50    Types: Cigarettes    Quit date: 1992    Years since quitting: 31.1   Smokeless tobacco: Never  Vaping Use   Vaping Use: Never used  Substance and Sexual Activity   Alcohol use: No   Drug use: No   Sexual activity: Not Currently  Other Topics Concern   Not on file  Social History Narrative   Married for 68 years.Retired.No longer in Assisted Living, he came home after wife went to memory care. [facility] for memory care as of Dec 2021.   Social Determinants of Health   Financial Resource Strain: Not on file  Food Insecurity: Not on file  Transportation Needs: Not on file  Physical Activity: Not on file  Stress: Not on file  Social Connections: Not on file     Family History:  The patient's family history includes Breast cancer in his mother; Heart attack in his maternal  grandfather; Heart disease in his father; Other in his mother; Renal cancer in his son; Stroke in his mother.   Review of Systems:    Please see the history of present illness.     All other systems reviewed and are otherwise negative except as noted above.   Physical Exam:    VS:  BP 112/62    Pulse 66    Ht 5\' 9"  (1.753 m)    Wt 212 lb (96.2 kg)    SpO2 98%    BMI 31.31 kg/m    General: Pleasant elderly male  appearing in no acute distress. Head: Normocephalic, atraumatic. Neck: No carotid bruits. JVD not elevated.  Lungs: Respirations regular and unlabored, without wheezes or rales.  Heart: Irregularly irregular. No S3 or S4.  No murmur, no rubs, or gallops appreciated. Abdomen: Appears non-distended. No obvious abdominal masses. Msk:  Strength and tone appear normal for age. No obvious joint deformities or effusions. Extremities: No clubbing or cyanosis. No pitting edema.  Distal pedal pulses are 2+ bilaterally. Neuro: Alert and oriented X 3. Moves all extremities spontaneously. No focal deficits noted. Psych:  Responds to questions appropriately with a normal affect. Skin: No rashes or lesions noted  Wt Readings from Last 3 Encounters:  03/14/21 212 lb (96.2 kg)  03/13/21 169 lb (76.7 kg)  03/10/21 216 lb 0.8 oz (98 kg)     Studies/Labs Reviewed:   EKG:  EKG is not ordered today. EKG from 11/27/2020 is reviewed and shows rate-controlled atrial fibrillation, HR 73 with no acute ST changes.   Recent Labs: 01/06/2021: ALT 9; BUN 20; Creatinine, Ser 0.80; Hemoglobin 11.4; Platelets 204; Potassium 4.7; Sodium 141   Lipid Panel    Component Value Date/Time   CHOL 130 01/06/2021 1114   TRIG 56 01/06/2021 1114   HDL 49 01/06/2021 1114   CHOLHDL 3.4 07/28/2019 1132   VLDL 17 02/17/2013 0820   LDLCALC 69 01/06/2021 1114   LDLCALC 84 07/28/2019 1132    Additional studies/ records that were reviewed today include:   Echocardiogram: 03/2019 IMPRESSIONS     1. Left  ventricular ejection fraction, by estimation, is 60 to 65%. The  left ventricle has normal function. The left ventricle has no regional  wall motion abnormalities. There is severe left ventricular hypertrophy.  Left ventricular diastolic parameters   are indeterminate.   2. Right ventricular systolic function was not well visualized. The right  ventricular size is not well visualized. There is mildly elevated  pulmonary artery systolic pressure.   3. Left atrial size was severely dilated.   4. The mitral valve is normal in structure. No evidence of mitral valve  regurgitation. No evidence of mitral stenosis.   5. The aortic valve is tricuspid. Aortic valve regurgitation is not  visualized. No aortic stenosis is present.   6. Techincally difficult study   Carotid Dopplers: 04/2020 Summary:  Right Carotid: Velocities in the right ICA are consistent with a 40-59%                 stenosis. The ECA appears >50% stenosed. Upper end of  scale.   Left Carotid: Velocities in the left ICA are consistent with a 40-59%  stenosis.                Non-hemodynamically significant plaque <50% noted in the  CCA.   Assessment:    1. Permanent atrial fibrillation ([place])   2. Bilateral carotid artery stenosis   3. Essential hypertension   4. Hyperlipidemia LDL goal <70   5. Infrarenal abdominal aortic aneurysm (AAA) without rupture      Plan:   In order of problems listed above:  1. Permanent Atrial Fibrillation - He denies any recent palpitations and his heart rate is well controlled in the 60's during today's visit. Continue Propanolol 20 mg daily for rate control. - He does remain on Coumadin for anticoagulation which is followed by our Coumadin clinic and denies any evidence of active bleeding. INR was stable at 2.6 by recent labs on 03/13/2021.  2. Carotid Artery Stenosis - Dopplers  in 04/2020 showed 40 to 59% stenosis bilaterally which was an improvement when compared to prior imaging in  2021. I did recommend that he obtain repeat carotid dopplers prior to his next visit. Continue Crestor 10 mg daily.  3. HTN - His blood pressure is well controlled at 112/62 during today's visit. Continue current medication regimen with Amlodipine 5 mg daily, Benazepril 40 mg daily and Propanolol 20 mg daily.  4. HLD - FLP in 12/2020 showed total cholesterol 130, triglycerides 56, HDL 49 and LDL 69. He remains on Crestor 10 mg daily.  5. AAA - CTA in 05/2020 showed a 3.8 cm infrarenal AAA. Repeat imaging recommended in 2 years so would discuss repeat imaging at his visit in 2024.   Medication Adjustments/Labs and Tests Ordered: Current medicines are reviewed at length with the patient today.  Concerns regarding medicines are outlined above.  Medication changes, Labs and Tests ordered today are listed in the Patient Instructions below. Patient Instructions  Medication Instructions:  Your physician recommends that you continue on your current medications as directed. Please refer to the Current Medication list given to you today.  *If you need a refill on your cardiac medications before your next appointment, please call your pharmacy*   Lab Work: NONE   If you have labs (blood work) drawn today and your tests are completely normal, you will receive your results only by: [place] (if you have MyChart) OR A paper copy in the mail If you have any lab test that is abnormal or we need to change your treatment, we will call you to review the results.   Testing/Procedures: Your physician has requested that you have a carotid duplex. This test is an ultrasound of the carotid arteries in your neck. It looks at blood flow through these arteries that supply the brain with blood. Allow one hour for this exam. There are no restrictions or special instructions.    Follow-Up: At [facility], you and your health needs are our priority.  As part of our continuing mission to provide you  with exceptional heart care, we have created designated Provider Care Teams.  These Care Teams include your primary Cardiologist (physician) and Advanced Practice Providers (APPs -  Physician Assistants and Nurse Practitioners) who all work together to provide you with the care you need, when you need it.  We recommend signing up for the patient portal called "MyChart".  Sign up information is provided on this After Visit Summary.  MyChart is used to connect with patients for Virtual Visits (Telemedicine).  Patients are able to view lab/test results, encounter notes, upcoming appointments, etc.  Non-urgent messages can be sent to your provider as well.   To learn more about what you can do with MyChart, go to [email or web address].    Your next appointment:   1 year(s)  The format for your next appointment:   In Person  Provider:   [name], MD    Other Instructions Thank you for choosing [facility]!      Signed, Erma Heritage, PA-C  03/14/2021 4:22 PM    [place] [address] [place], [place] [address] Phone: [phone] Fax: [phone] ,s

## 2021-03-14 NOTE — Patient Instructions (Signed)
Medication Instructions:  Your physician recommends that you continue on your current medications as directed. Please refer to the Current Medication list given to you today.  *If you need a refill on your cardiac medications before your next appointment, please call your pharmacy*   Lab Work: NONE   If you have labs (blood work) drawn today and your tests are completely normal, you will receive your results only by: [place] (if you have MyChart) OR A paper copy in the mail If you have any lab test that is abnormal or we need to change your treatment, we will call you to review the results.   Testing/Procedures: Your physician has requested that you have a carotid duplex. This test is an ultrasound of the carotid arteries in your neck. It looks at blood flow through these arteries that supply the brain with blood. Allow one hour for this exam. There are no restrictions or special instructions.    Follow-Up: At [facility], you and your health needs are our priority.  As part of our continuing mission to provide you with exceptional heart care, we have created designated Provider Care Teams.  These Care Teams include your primary Cardiologist (physician) and Advanced Practice Providers (APPs -  Physician Assistants and Nurse Practitioners) who all work together to provide you with the care you need, when you need it.  We recommend signing up for the patient portal called "MyChart".  Sign up information is provided on this After Visit Summary.  MyChart is used to connect with patients for Virtual Visits (Telemedicine).  Patients are able to view lab/test results, encounter notes, upcoming appointments, etc.  Non-urgent messages can be sent to your provider as well.   To learn more about what you can do with MyChart, go to [email or web address].    Your next appointment:   1 year(s)  The format for your next appointment:   In Person  Provider:   [name], MD     Other Instructions Thank you for choosing [facility]!

## 2021-03-14 NOTE — Patient Instructions (Signed)
Pt is in independent living facility in [place].  Gets labs done at [facility] in [place].  Son Glendell Docker manages pts medicine.  Lab orders were mailed to [place] address. Continue warfarin 5 mg daily except 2.5mg  on Sundays, Tuesdays and Thursdays Recheck wk of 04/24/21. Called and spoke with Glendell Docker (son).  Coumadin instructions given and he verbalized understanding.

## 2021-03-14 NOTE — Progress Notes (Signed)
Please review results with patient.  They should contact cardiology office as  discussed for Coumadin management.  Thank you.

## 2021-03-20 ENCOUNTER — Encounter: Payer: Self-pay | Admitting: Nurse Practitioner

## 2021-03-20 ENCOUNTER — Other Ambulatory Visit: Payer: Self-pay

## 2021-03-20 DIAGNOSIS — Z0000 Encounter for general adult medical examination without abnormal findings: Secondary | ICD-10-CM | POA: Diagnosis not present

## 2021-03-20 NOTE — Progress Notes (Signed)
error    This encounter was created in error - please disregard.

## 2021-03-21 NOTE — Telephone Encounter (Signed)
Spoke with Davita at [facility] faxed over demographics and last office note per her request we will see if they are able to take him on son aware of what's going on

## 2021-03-21 NOTE — Telephone Encounter (Signed)
Please advise 

## 2021-03-22 ENCOUNTER — Other Ambulatory Visit: Payer: Self-pay

## 2021-03-22 ENCOUNTER — Encounter (INDEPENDENT_AMBULATORY_CARE_PROVIDER_SITE_OTHER): Payer: Medicare PPO | Admitting: Nurse Practitioner

## 2021-03-23 ENCOUNTER — Other Ambulatory Visit: Payer: Self-pay

## 2021-03-23 DIAGNOSIS — N400 Enlarged prostate without lower urinary tract symptoms: Secondary | ICD-10-CM

## 2021-03-27 NOTE — Progress Notes (Signed)
This encounter was created in error - please disregard.

## 2021-03-27 NOTE — Addendum Note (Signed)
Addended by: Eual Fines on: 03/27/2021 05:50 PM   Modules accepted: Orders, Level of Service

## 2021-04-04 ENCOUNTER — Telehealth: Payer: Self-pay | Admitting: Urology

## 2021-04-04 NOTE — Telephone Encounter (Signed)
Patient has been set up with [facility] home health and they will be changing his catheter at home.  They will not need the nurse visit on 3/20. ? ?Patient's family would like to pick up the supplies needed for his first catheter exchange. ? ?They will be in town tomorrow afternoon and would like to pick it up then. ?

## 2021-04-05 NOTE — Telephone Encounter (Signed)
Daughter  came by office to pick up supplies. ?

## 2021-04-06 ENCOUNTER — Other Ambulatory Visit: Payer: Self-pay

## 2021-04-06 ENCOUNTER — Emergency Department (HOSPITAL_COMMUNITY)
Admission: EM | Admit: 2021-04-06 | Discharge: 2021-04-06 | Disposition: A | Discharge status: Home / Self Care | Payer: Medicare PPO | Attending: Emergency Medicine | Admitting: Emergency Medicine

## 2021-04-06 ENCOUNTER — Encounter (HOSPITAL_COMMUNITY): Payer: Self-pay | Admitting: *Deleted

## 2021-04-06 DIAGNOSIS — T83098A Other mechanical complication of other indwelling urethral catheter, initial encounter: Secondary | ICD-10-CM | POA: Diagnosis not present

## 2021-04-06 DIAGNOSIS — Y732 Prosthetic and other implants, materials and accessory gastroenterology and urology devices associated with adverse incidents: Secondary | ICD-10-CM | POA: Diagnosis not present

## 2021-04-06 DIAGNOSIS — Z79899 Other long term (current) drug therapy: Secondary | ICD-10-CM | POA: Insufficient documentation

## 2021-04-06 DIAGNOSIS — Z91040 Latex allergy status: Secondary | ICD-10-CM | POA: Insufficient documentation

## 2021-04-06 DIAGNOSIS — Z7901 Long term (current) use of anticoagulants: Secondary | ICD-10-CM | POA: Insufficient documentation

## 2021-04-06 MED ORDER — LIDOCAINE HCL URETHRAL/MUCOSAL 2 % EX GEL
1.0000 "application " | Freq: Once | CUTANEOUS | Status: AC
  Administered 2021-04-06: 1 via URETHRAL
  Filled 2021-04-06: qty 10

## 2021-04-06 NOTE — ED Provider Notes (Signed)
?[facility] ?Provider Note ? ? ?CSN: [id number] ?Arrival date & time: 04/06/21  1733 ? ?  ? ?History ? ?No chief complaint on file. ? ? ?Tristan Lopez is a 88 y.o. male. ? ?Patient reports home health came out today and changed his Foley catheter.  Patient reports pain since having catheter changed.  Patient denies any fever denies any chills denies any burning with urination.  Patient has a Foley catheter secondary to urinary retention ? ?The history is provided by the patient. No language interpreter was used.  ? ?  ? ?Home Medications ?Prior to Admission medications   ?Medication Sig Start Date End Date Taking? Authorizing Provider  ?amLODipine (NORVASC) 5 MG tablet Take 1 tablet (5 mg total) by mouth daily. 01/20/21   Noreene Larsson, NP  ?Ascorbic Acid (VITAMIN C) 1000 MG tablet Take 1,000 mg by mouth daily.    [provider]  ?benazepril (LOTENSIN) 40 MG tablet Take 1 tablet (40 mg total) by mouth daily. 03/14/21   Erma Heritage, PA-C  ?Cholecalciferol (VITAMIN D-3) 1000 units CAPS Take 2,000 Units by mouth daily.    [provider]  ?furosemide (LASIX) 20 MG tablet Take 1 tablet (20 mg total) by mouth daily. Take 20 mg daily as needed for leg swelling 11/08/20   Noreene Larsson, NP  ?Garlic 0623 MG CAPS Take 1,000 mg by mouth daily.    [provider]  ?pantoprazole (PROTONIX) 40 MG tablet Take 1 tablet (40 mg total) by mouth 2 (two) times daily. 11/08/20 03/14/21  Noreene Larsson, NP  ?potassium chloride (KLOR-CON) 10 MEQ tablet Take 1 tablet (10 mEq total) by mouth daily. 02/07/21   Renee Rival, FNP  ?Probiotic Product (DIGESTIVE ADVANTAGE PO) Take by mouth 2 (two) times daily.     [provider]  ?propranolol (INDERAL) 20 MG tablet Take 1 tablet (20 mg total) by mouth daily. 03/14/21   Ahmed Prima, Fransisco Hertz, PA-C  ?rosuvastatin (CRESTOR) 10 MG tablet Take 1 tablet (10 mg total) by mouth daily. 03/14/21   Ahmed Prima, Fransisco Hertz, PA-C  ?tadalafil  (CIALIS) 5 MG tablet Take 1 tablet (5 mg total) by mouth daily. 01/05/21   Noreene Larsson, NP  ?warfarin (COUMADIN) 5 MG tablet Take 1 tablet (5 mg total) by mouth daily. ?Patient taking differently: Take 2.5-5 mg by mouth. As directed by coumadin clinic 12/02/20   Barton Dubois, MD  ?   ? ?Allergies    ?Latex   ? ?Review of Systems   ?Review of Systems  ?Genitourinary:  Positive for penile pain.  ?All other systems reviewed and are negative. ? ?Physical Exam ?Updated Vital Signs ?BP (!) 160/66 (BP Location: Right Arm)   Pulse 67   Temp 97.9 ?F (36.6 ?C) (Oral)   Resp 16   SpO2 100%  ?Physical Exam ?Vitals and nursing note reviewed.  ?Constitutional:   ?   Appearance: He is well-developed.  ?HENT:  ?   Head: Normocephalic.  ?Cardiovascular:  ?   Rate and Rhythm: Normal rate.  ?Pulmonary:  ?   Effort: Pulmonary effort is normal.  ?Abdominal:  ?   General: There is no distension.  ?Genitourinary: ?   Comments: Foley balloon in penis, foley removed and pt reports relief.  Foley is connected to leg bag and bag appears to have slid down and pulled foley (no tubing from foley to bag)   ?Musculoskeletal:     ?   General: Normal range of motion.  ?  Cervical back: Normal range of motion.  ?Neurological:  ?   General: No focal deficit present.  ?   Mental Status: He is alert and oriented to person, place, and time.  ? ? ?ED Results / Procedures / Treatments   ?Labs ?(all labs ordered are listed, but only abnormal results are displayed) ?Labs Reviewed - No data to display ? ?EKG ?None ? ?Radiology ?No results found. ? ?Procedures ?Procedures  ? ? ?Medications Ordered in ED ?Medications  ?lidocaine (XYLOCAINE) 2 % jelly 1 application. (has no administration in time range)  ? ? ?ED Course/ Medical Decision Making/ A&P ?  ?                        ?Medical Decision Making ?Pt complains of pain from foley ? ?Problems Addressed: ?Complication of Foley catheter, initial encounter [facility]): acute illness or injury ? ?Amount and/or  Complexity of Data Reviewed ?Independent Historian:  ?   Details: Pt here with son who reports pt has had pain since foley was replaced ?External Data Reviewed: notes. ?   Details: Urology notes reviewed ? ? ?RN will replace foley and tubing.  I counseled on skin care  ? ? ? ? ? ? ? ?Final Clinical Impression(s) / ED Diagnoses ?Final diagnoses:  ?Complication of Foley catheter, initial encounter ([facility] [place])  ? ? ?Rx / DC Orders ?ED Discharge Orders   ? ? None  ? ?  ? ?An After Visit Summary was printed and given to the patient.  ?  ?Fransico Meadow, PA-C ?04/06/21 1832 ? ?  ?Noemi Chapel, MD ?04/07/21 1258 ? ?

## 2021-04-06 NOTE — ED Triage Notes (Signed)
Foley changed out by home health this morning, c/o pain  with catheter ?

## 2021-04-06 NOTE — Discharge Instructions (Signed)
Return if any problems.

## 2021-04-07 ENCOUNTER — Other Ambulatory Visit (HOSPITAL_COMMUNITY): Payer: Medicare PPO

## 2021-04-10 ENCOUNTER — Ambulatory Visit: Payer: Medicare PPO | Admitting: Nurse Practitioner

## 2021-04-10 ENCOUNTER — Ambulatory Visit: Payer: Medicare PPO

## 2021-04-24 ENCOUNTER — Telehealth: Payer: Self-pay

## 2021-04-24 NOTE — Telephone Encounter (Signed)
CAP program ? ?Copied ?Noted ?sleeved ?

## 2021-04-25 DIAGNOSIS — R791 Abnormal coagulation profile: Secondary | ICD-10-CM | POA: Diagnosis not present

## 2021-04-25 LAB — POCT INR: INR: 2.3 (ref 2.0–3.0)

## 2021-04-26 ENCOUNTER — Ambulatory Visit: Payer: Self-pay | Admitting: *Deleted

## 2021-04-26 DIAGNOSIS — Z5181 Encounter for therapeutic drug level monitoring: Secondary | ICD-10-CM

## 2021-04-26 DIAGNOSIS — Z0279 Encounter for issue of other medical certificate: Secondary | ICD-10-CM

## 2021-04-26 DIAGNOSIS — I4821 Permanent atrial fibrillation: Secondary | ICD-10-CM

## 2021-04-26 NOTE — Telephone Encounter (Signed)
Faxed forms to [phone] and sent to scans ?

## 2021-04-26 NOTE — Patient Instructions (Signed)
Pt is in independent living facility in [place].  Gets labs done at [facility] in [place].  Son Glendell Docker manages pts medicine.  Lab orders were mailed to [place] address. ?Continue warfarin 5 mg daily except 2.'5mg'$  on Sundays, Tuesdays and Thursdays ?Recheck wk of 06/06/21. ?Called and spoke with Glendell Docker (son).  Coumadin instructions given and he verbalized understanding. ?

## 2021-05-01 ENCOUNTER — Encounter (HOSPITAL_COMMUNITY): Payer: Self-pay | Admitting: *Deleted

## 2021-05-01 ENCOUNTER — Emergency Department (HOSPITAL_COMMUNITY)
Admission: EM | Admit: 2021-05-01 | Discharge: 2021-05-01 | Disposition: A | Discharge status: Home / Self Care | Payer: Medicare PPO | Attending: Emergency Medicine | Admitting: Emergency Medicine

## 2021-05-01 DIAGNOSIS — I10 Essential (primary) hypertension: Secondary | ICD-10-CM | POA: Diagnosis not present

## 2021-05-01 DIAGNOSIS — Z79899 Other long term (current) drug therapy: Secondary | ICD-10-CM | POA: Insufficient documentation

## 2021-05-01 DIAGNOSIS — Y846 Urinary catheterization as the cause of abnormal reaction of the patient, or of later complication, without mention of misadventure at the time of the procedure: Secondary | ICD-10-CM | POA: Diagnosis not present

## 2021-05-01 DIAGNOSIS — Z7901 Long term (current) use of anticoagulants: Secondary | ICD-10-CM | POA: Diagnosis not present

## 2021-05-01 DIAGNOSIS — Z91040 Latex allergy status: Secondary | ICD-10-CM | POA: Diagnosis not present

## 2021-05-01 DIAGNOSIS — T83098A Other mechanical complication of other indwelling urethral catheter, initial encounter: Secondary | ICD-10-CM | POA: Diagnosis not present

## 2021-05-01 DIAGNOSIS — T83091A Other mechanical complication of indwelling urethral catheter, initial encounter: Secondary | ICD-10-CM | POA: Diagnosis not present

## 2021-05-01 DIAGNOSIS — T839XXA Unspecified complication of genitourinary prosthetic device, implant and graft, initial encounter: Secondary | ICD-10-CM

## 2021-05-01 DIAGNOSIS — R1084 Generalized abdominal pain: Secondary | ICD-10-CM | POA: Insufficient documentation

## 2021-05-01 NOTE — Discharge Instructions (Signed)
Follow-up as planned with your urology ?

## 2021-05-01 NOTE — ED Triage Notes (Signed)
Foley catheter stopped draining ?

## 2021-05-01 NOTE — ED Provider Notes (Signed)
?[facility] ?Provider Note ? ? ?CSN: [id number] ?Arrival date & time: 05/01/21  1634 ? ?  ? ?History ? ?No chief complaint on file. ? ? ?Tristan Lopez is a 88 y.o. male. ? ?Patient has a Foley catheter.  It stopped draining recently.  He is complaining of abdominal discomfort.  Patient has a history of ? ?The history is provided by the patient and a friend. No language interpreter was used.  ?Abdominal Pain ?Pain location:  Generalized ?Pain quality: aching   ?Pain radiates to:  Does not radiate ?Pain severity:  Moderate ?Onset quality:  Gradual ?Timing:  Constant ?Progression:  Worsening ?Chronicity:  New ?Context: not alcohol use   ?Relieved by:  Nothing ?Associated symptoms: no chest pain, no cough, no diarrhea, no fatigue and no hematuria   ? ?  ? ?Home Medications ?Prior to Admission medications   ?Medication Sig Start Date End Date Taking? Authorizing Provider  ?amLODipine (NORVASC) 5 MG tablet Take 1 tablet (5 mg total) by mouth daily. 01/20/21   Noreene Larsson, NP  ?Ascorbic Acid (VITAMIN C) 1000 MG tablet Take 1,000 mg by mouth daily.    [provider]  ?benazepril (LOTENSIN) 40 MG tablet Take 1 tablet (40 mg total) by mouth daily. 03/14/21   Erma Heritage, PA-C  ?Cholecalciferol (VITAMIN D-3) 1000 units CAPS Take 2,000 Units by mouth daily.    [provider]  ?furosemide (LASIX) 20 MG tablet Take 1 tablet (20 mg total) by mouth daily. Take 20 mg daily as needed for leg swelling 11/08/20   Noreene Larsson, NP  ?Garlic 7341 MG CAPS Take 1,000 mg by mouth daily.    [provider]  ?pantoprazole (PROTONIX) 40 MG tablet Take 1 tablet (40 mg total) by mouth 2 (two) times daily. 11/08/20 03/14/21  Noreene Larsson, NP  ?potassium chloride (KLOR-CON) 10 MEQ tablet Take 1 tablet (10 mEq total) by mouth daily. 02/07/21   Renee Rival, FNP  ?Probiotic Product (DIGESTIVE ADVANTAGE PO) Take by mouth 2 (two) times daily.     [provider]   ?propranolol (INDERAL) 20 MG tablet Take 1 tablet (20 mg total) by mouth daily. 03/14/21   Ahmed Prima, Fransisco Hertz, PA-C  ?rosuvastatin (CRESTOR) 10 MG tablet Take 1 tablet (10 mg total) by mouth daily. 03/14/21   Ahmed Prima, Fransisco Hertz, PA-C  ?tadalafil (CIALIS) 5 MG tablet Take 1 tablet (5 mg total) by mouth daily. 01/05/21   Noreene Larsson, NP  ?warfarin (COUMADIN) 5 MG tablet Take 1 tablet (5 mg total) by mouth daily. ?Patient taking differently: Take 2.5-5 mg by mouth. As directed by coumadin clinic 12/02/20   Barton Dubois, MD  ?   ? ?Allergies    ?Latex   ? ?Review of Systems   ?Review of Systems  ?Constitutional:  Negative for appetite change and fatigue.  ?HENT:  Negative for congestion, ear discharge and sinus pressure.   ?Eyes:  Negative for discharge.  ?Respiratory:  Negative for cough.   ?Cardiovascular:  Negative for chest pain.  ?Gastrointestinal:  Positive for abdominal pain. Negative for diarrhea.  ?Genitourinary:  Negative for frequency and hematuria.  ?Musculoskeletal:  Negative for back pain.  ?Skin:  Negative for rash.  ?Neurological:  Negative for seizures and headaches.  ?Psychiatric/Behavioral:  Negative for hallucinations.   ? ?Physical Exam ?Updated Vital Signs ?BP (!) 183/99   Pulse 79   Temp 98.7 ?F (37.1 ?C)   Resp 20   Ht '5\' 9"'$  (1.753  m)   Wt 96.2 kg   SpO2 100%   BMI 31.31 kg/m?  ?Physical Exam ?Vitals and nursing note reviewed.  ?Constitutional:   ?   Appearance: He is well-developed.  ?HENT:  ?   Head: Normocephalic.  ?   Nose: Nose normal.  ?Eyes:  ?   General: No scleral icterus. ?   Conjunctiva/sclera: Conjunctivae normal.  ?Neck:  ?   Thyroid: No thyromegaly.  ?Cardiovascular:  ?   Rate and Rhythm: Normal rate and regular rhythm.  ?   Heart sounds: No murmur heard. ?  No friction rub. No gallop.  ?Pulmonary:  ?   Breath sounds: No stridor. No wheezing or rales.  ?Chest:  ?   Chest wall: No tenderness.  ?Abdominal:  ?   General: There is distension.  ?   Tenderness: There is  abdominal tenderness. There is no rebound.  ?Musculoskeletal:     ?   General: Normal range of motion.  ?   Cervical back: Neck supple.  ?Lymphadenopathy:  ?   Cervical: No cervical adenopathy.  ?Skin: ?   Findings: No erythema or rash.  ?Neurological:  ?   Mental Status: He is alert and oriented to person, place, and time.  ?   Motor: No abnormal muscle tone.  ?   Coordination: Coordination normal.  ?Psychiatric:     ?   Behavior: Behavior normal.  ? ? ?ED Results / Procedures / Treatments   ?Labs ?(all labs ordered are listed, but only abnormal results are displayed) ?Labs Reviewed - No data to display ? ?EKG ?EKG Interpretation ? ?Date/Time:  Monday May 01 2021 17:02:01 EDT ?Ventricular Rate:  77 ?PR Interval:    ?QRS Duration: 76 ?QT Interval:  378 ?QTC Calculation: 428 ?R Axis:   54 ?Text Interpretation: Atrial fibrillation Low voltage, precordial leads Confirmed by Milton Ferguson [phone]) on 05/01/2021 6:12:05 PM ? ?Radiology ?No results found. ? ?Procedures ?Procedures  ? ? ?Medications Ordered in ED ?Medications - No data to display ? ?ED Course/ Medical Decision Making/ A&P ?  ?                        ?Medical Decision Making ?This patient presents to the ED for concern of abdominal pain, this involves an extensive number of treatment options, and is a complaint that carries with it a high risk of complications and morbidity.  The differential diagnosis includes clogged Foley catheter, appendicitis ? ? ?Co morbidities that complicate the patient evaluation ? ?Prostate disease, hypertension ? ? ?Additional history obtained: ? ?Additional history obtained from family member ?External records from outside source obtained and reviewed including hospital records ? ? ?Lab Tests: ? ?No lab ?Imaging Studies ordered: ? ?I ordered imaging studies including bladder scan ?I independently visualized and interpreted imaging which showed 700 cc urine ?I agree with the radiologist interpretation ? ? ?Cardiac Monitoring: /  EKG: ? ?The patient was maintained on a cardiac monitor.  I personally viewed and interpreted the cardiac monitored which showed an underlying rhythm of: Normal sinus rhythm ? ? ?Consultations Obtained: ? ?No consult ? ?Problem List / ED Course / Critical interventions / Medication management ? ?Prostate disease and urinary retention ?No medicines given but Foley replaced ?Reevaluation of the patient after these medicines showed that the patient improved ?I have reviewed the patients home medicines and have made adjustments as needed ? ? ?Social Determinants of Health: ? ?None ? ? ?Test / Admission - Considered: ? ?  None ? ?Foley catheter was replaced and was draining appropriately.  Patient with much improved.  He will follow-up with urology as planned ? ? ? ? ? ? ? ?Final Clinical Impression(s) / ED Diagnoses ?Final diagnoses:  ?Foley catheter problem, initial encounter ([facility])  ? ? ?Rx / DC Orders ?ED Discharge Orders   ? ? None  ? ?  ? ? ?  ?Milton Ferguson, MD ?05/03/21 [phone] ? ?

## 2021-05-01 NOTE — ED Notes (Signed)
Leg bag placed on pt. Teaching provided to pt and son on proper use of leg back and emptying. Pt reports relief of all pain and pressure ?

## 2021-05-05 ENCOUNTER — Other Ambulatory Visit: Payer: Self-pay

## 2021-05-05 ENCOUNTER — Ambulatory Visit: Payer: Medicare PPO | Admitting: Urology

## 2021-05-08 ENCOUNTER — Encounter: Payer: Self-pay | Admitting: Internal Medicine

## 2021-05-09 ENCOUNTER — Other Ambulatory Visit: Payer: Self-pay | Admitting: Nurse Practitioner

## 2021-05-09 ENCOUNTER — Other Ambulatory Visit: Payer: Self-pay

## 2021-05-09 MED ORDER — WARFARIN SODIUM 5 MG PO TABS: 5.0000 mg | ORAL_TABLET | Freq: Every day | ORAL | 2 refills | Status: DC

## 2021-05-09 MED ORDER — FUROSEMIDE 20 MG PO TABS: 20.0000 mg | ORAL_TABLET | Freq: Every day | ORAL | 1 refills | Status: DC

## 2021-05-10 ENCOUNTER — Encounter: Payer: Self-pay | Admitting: Urology

## 2021-05-10 ENCOUNTER — Ambulatory Visit: Payer: Medicare PPO | Admitting: Urology

## 2021-05-10 VITALS — BP 136/76 | HR 73

## 2021-05-10 DIAGNOSIS — R339 Retention of urine, unspecified: Secondary | ICD-10-CM | POA: Diagnosis not present

## 2021-05-10 DIAGNOSIS — N138 Other obstructive and reflux uropathy: Secondary | ICD-10-CM | POA: Diagnosis not present

## 2021-05-10 DIAGNOSIS — N401 Enlarged prostate with lower urinary tract symptoms: Secondary | ICD-10-CM | POA: Diagnosis not present

## 2021-05-10 MED ORDER — BACITRACIN 500 UNIT/GM EX OINT
1.0000 | TOPICAL_OINTMENT | Freq: Two times a day (BID) | CUTANEOUS | 3 refills | Status: DC
Start: 2021-05-10 — End: 2022-02-15

## 2021-05-10 NOTE — Progress Notes (Signed)
? ?05/10/2021 ?10:30 AM  ? ?Tristan Lopez ?1930/11/29 ?[id number] ? ?Referring provider: Noreene Larsson, NP ?[address] ?[place],  [place] [address] ? ?Urinary retention ? ? ?HPI: ?Tristan Lopez is a 88yo here for followup for BPH with urinary retention. He is currently managed with an indwelling 16 french foley. Within the past 2-3 months he has been seen multiple times for a clogged foley due to sediment. He denies any UTIs. No hematuria or pelvic pain. No other complaints today ? ? ?PMH: ?Past Medical History:  ?Diagnosis Date  ? Abdominal aortic aneurysm without rupture ([facility])   ? Bilateral renal cysts   ? BPH (benign prostatic hyperplasia)   ? Carotid artery occlusion   ? Chronic atrial fibrillation (HCC)   ? COPD (chronic obstructive pulmonary disease) ([place])   ? Diverticulosis   ? Pancolonic  ? Essential hypertension   ? Hemorrhoids   ? Hiatal hernia   ? Hx of adenomatous colonic polyps   ? Hypercholesteremia   ? Liver cyst   ? Benign by liver biopsy September 2013  ? Nephrolithiasis   ? Nephrolithiasis   ? Permanent atrial fibrillation ([place])   ? Pre-diabetes   ? Reflux esophagitis   ? Sleep apnea   ? Uses CPAP  ? Tubular adenoma   ? ? ?Surgical History: ?Past Surgical History:  ?Procedure Laterality Date  ? COLONOSCOPY    ? 2003, no polyps per patient. Dr. West Carbo  ? COLONOSCOPY  11/01/11  ? Dr. Gala Romney- tubular adenoma,suboptimal preparation, internal hemorrhoids, o/w normal rectum. pancolonic diverticulosis  ? COLONOSCOPY WITH PROPOFOL N/A 11/29/2020  ? Procedure: COLONOSCOPY WITH PROPOFOL;  Surgeon: Harvel Quale, MD;  Location: [facility];  Service: Gastroenterology;  Laterality: N/A;  ? ESOPHAGOGASTRODUODENOSCOPY  11/01/11  ? Dr. Gala Romney- erosive reflux esophagitis along with a patulous EG junction, hialtal hernia, antral erosions with possible area of healing ulceration- s/p bx= chronic erosive gastritis, no malignancy  ? ESOPHAGOGASTRODUODENOSCOPY (EGD) WITH PROPOFOL N/A 11/29/2020  ?  Procedure: ESOPHAGOGASTRODUODENOSCOPY (EGD) WITH PROPOFOL;  Surgeon: Harvel Quale, MD;  Location: [facility];  Service: Gastroenterology;  Laterality: N/A;  ? HERNIA REPAIR    ? x2, bilateral inguinal   ? KIDNEY STONE SURGERY    ? KNEE ARTHROSCOPY    ? X2  ? NASAL ENDOSCOPY    ? ? ?Home Medications:  ?Allergies as of 05/10/2021   ? ?   Reactions  ? Latex Rash  ? ?  ? ?  ?Medication List  ?  ? ?  ? Accurate as of May 10, 2021 10:30 AM. If you have any questions, ask your nurse or doctor.  ?  ?  ? ?  ? ?amLODipine 5 MG tablet ?Commonly known as: NORVASC ?Take 1 tablet (5 mg total) by mouth daily. ?  ?benazepril 40 MG tablet ?Commonly known as: LOTENSIN ?Take 1 tablet (40 mg total) by mouth daily. ?  ?DIGESTIVE ADVANTAGE PO ?Take by mouth 2 (two) times daily. ?  ?furosemide 20 MG tablet ?Commonly known as: LASIX ?Take 1 tablet (20 mg total) by mouth daily. Take 20 mg daily as needed for leg swelling ?  ?Garlic 2119 MG Caps ?Take 1,000 mg by mouth daily. ?  ?pantoprazole 40 MG tablet ?Commonly known as: PROTONIX ?Take 1 tablet (40 mg total) by mouth 2 (two) times daily. ?  ?potassium chloride 10 MEQ tablet ?Commonly known as: KLOR-CON ?Take 1 tablet (10 mEq total) by mouth daily. ?  ?propranolol 20 MG tablet ?Commonly  known as: INDERAL ?Take 1 tablet (20 mg total) by mouth daily. ?  ?rosuvastatin 10 MG tablet ?Commonly known as: CRESTOR ?Take 1 tablet (10 mg total) by mouth daily. ?  ?tadalafil 5 MG tablet ?Commonly known as: CIALIS ?Take 1 tablet (5 mg total) by mouth daily. ?  ?vitamin C 1000 MG tablet ?Take 1,000 mg by mouth daily. ?  ?Vitamin D-3 25 MCG (1000 UT) Caps ?Take 2,000 Units by mouth daily. ?  ?warfarin 5 MG tablet ?Commonly known as: COUMADIN ?Take as directed by the anticoagulation clinic. If you are unsure how to take this medication, talk to your nurse or doctor. ?Original instructions: Take 1 tablet (5 mg total) by mouth daily. ?  ? ?  ? ? ?Allergies:  ?Allergies  ?Allergen Reactions   ? Latex Rash  ? ? ?Family History: ?Family History  ?Problem Relation Age of Onset  ? Breast cancer Mother   ? Other Mother   ?     Essential tremor  ? Stroke Mother   ? Heart disease Father   ?     Pacemaker  ? Heart attack Maternal Grandfather   ? Renal cancer Son   ? Colon cancer Neg Hx   ? Liver disease Neg Hx   ? Prostate cancer Neg Hx   ? Bladder Cancer Neg Hx   ? Kidney cancer Neg Hx   ? ? ?Social History:  reports that he quit smoking about 31 years ago. His smoking use included cigarettes. He smoked an average of .5 packs per day. He has never used smokeless tobacco. He reports that he does not drink alcohol and does not use drugs. ? ?ROS: ?All other review of systems were reviewed and are negative except what is noted above in HPI ? ?Physical Exam: ?BP 136/76   Pulse 73   ?Constitutional:  Alert and oriented, No acute distress. ?HEENT: [place] AT, moist mucus membranes.  Trachea midline, no masses. ?Cardiovascular: No clubbing, cyanosis, or edema. ?Respiratory: Normal respiratory effort, no increased work of breathing. ?GI: Abdomen is soft, nontender, nondistended, no abdominal masses ?GU: No CVA tenderness.  ?Lymph: No cervical or inguinal lymphadenopathy. ?Skin: No rashes, bruises or suspicious lesions. ?Neurologic: Grossly intact, no focal deficits, moving all 4 extremities. ?Psychiatric: Normal mood and affect. ? ?Laboratory Data: ?Lab Results  ?Component Value Date  ? WBC 4.6 01/06/2021  ? HGB 11.4 (L) 01/06/2021  ? HCT 35.7 (L) 01/06/2021  ? MCV 92 01/06/2021  ? PLT 204 01/06/2021  ? ? ?Lab Results  ?Component Value Date  ? CREATININE 0.80 01/06/2021  ? ? ?No results found for: PSA ? ?No results found for: TESTOSTERONE ? ?Lab Results  ?Component Value Date  ? HGBA1C 5.2 01/06/2021  ? ? ?Urinalysis ?   ?Component Value Date/Time  ? [place] (A) 03/10/2021 1134  ? APPEARANCEUR CLOUDY (A) 03/10/2021 1134  ? APPEARANCEUR Clear 09/21/2020 1431  ? LABSPEC 1.016 03/10/2021 1134  ? PHURINE 8.0  03/10/2021 1134  ? GLUCOSEU NEGATIVE 03/10/2021 1134  ? [place] NEGATIVE 03/10/2021 1134  ? [place] NEGATIVE 03/10/2021 1134  ? BILIRUBINUR Negative 09/21/2020 1431  ? KETONESUR 5 (A) 03/10/2021 1134  ? PROTEINUR 100 (A) 03/10/2021 1134  ? UROBILINOGEN 0.2 10/04/2011 2218  ? NITRITE NEGATIVE 03/10/2021 1134  ? LEUKOCYTESUR SMALL (A) 03/10/2021 1134  ? ? ?Lab Results  ?Component Value Date  ? LABMICR Comment 09/21/2020  ? WBCUA 0-5 08/12/2020  ? LABEPIT None seen 08/12/2020  ? MUCUS Present 08/12/2020  ? BACTERIA  RARE (A) 03/10/2021  ? ? ?Pertinent Imaging: ? ?No results found for this or any previous visit. ? ?No results found for this or any previous visit. ? ?No results found for this or any previous visit. ? ?No results found for this or any previous visit. ? ?No results found for this or any previous visit. ? ?No results found for this or any previous visit. ? ?No results found for this or any previous visit. ? ?No results found for this or any previous visit. ? ? ?Assessment & Plan:   ? ?1. Benign prostatic hyperplasia with urinary obstruction ?-continue monthly foley changes. Foley will be upsize foley to 18 french ? ?2. Urinary retention ?-continue monthly foley changes.  ? ? ?Return in about 3 weeks (around 05/31/2021) for foley change 18 french, RTC 1 year with MD. ? ?Nicolette Bang, MD ? ?[facility] ?  ?

## 2021-05-10 NOTE — Patient Instructions (Signed)

## 2021-05-12 LAB — MICROSCOPIC EXAMINATION: Renal Epithel, UA: NONE SEEN /hpf

## 2021-05-12 LAB — URINALYSIS, ROUTINE W REFLEX MICROSCOPIC
Bilirubin, UA: NEGATIVE
Glucose, UA: NEGATIVE
Ketones, UA: NEGATIVE
Nitrite, UA: POSITIVE — AB
Specific Gravity, UA: 1.015 (ref 1.005–1.030)
Urobilinogen, Ur: 1 mg/dL (ref 0.2–1.0)
pH, UA: 8.5 — ABNORMAL HIGH (ref 5.0–7.5)

## 2021-05-23 ENCOUNTER — Ambulatory Visit: Payer: Medicare PPO | Admitting: Nurse Practitioner

## 2021-05-23 ENCOUNTER — Encounter: Payer: Self-pay | Admitting: Nurse Practitioner

## 2021-05-23 VITALS — BP 134/61 | HR 68 | Ht 69.0 in | Wt 208.0 lb

## 2021-05-23 DIAGNOSIS — K219 Gastro-esophageal reflux disease without esophagitis: Secondary | ICD-10-CM

## 2021-05-23 DIAGNOSIS — M25552 Pain in left hip: Secondary | ICD-10-CM | POA: Diagnosis not present

## 2021-05-23 DIAGNOSIS — N400 Enlarged prostate without lower urinary tract symptoms: Secondary | ICD-10-CM | POA: Diagnosis not present

## 2021-05-23 DIAGNOSIS — E785 Hyperlipidemia, unspecified: Secondary | ICD-10-CM | POA: Diagnosis not present

## 2021-05-23 DIAGNOSIS — I10 Essential (primary) hypertension: Secondary | ICD-10-CM | POA: Diagnosis not present

## 2021-05-23 DIAGNOSIS — I4891 Unspecified atrial fibrillation: Secondary | ICD-10-CM | POA: Diagnosis not present

## 2021-05-23 DIAGNOSIS — D649 Anemia, unspecified: Secondary | ICD-10-CM | POA: Diagnosis not present

## 2021-05-23 DIAGNOSIS — G8929 Other chronic pain: Secondary | ICD-10-CM | POA: Diagnosis not present

## 2021-05-23 MED ORDER — PANTOPRAZOLE SODIUM 40 MG PO TBEC: 40.0000 mg | DELAYED_RELEASE_TABLET | Freq: Every day | ORAL | 3 refills | Status: DC

## 2021-05-23 NOTE — Assessment & Plan Note (Signed)
Chronic condition well-controlled ?Has been taking Protonix 40 mg twice daily ?Patient told to start taking protonix  40 mg daily instead of twice daily ?Follow-up in 4 months ?

## 2021-05-23 NOTE — Patient Instructions (Addendum)
?  Please get your TDAP vaccine at the pharmacy  ?Pleas get your fasting labs done as discussed  ?Start taking protonix '40mg'$  daily for your acid reflux .  ? ? ?It is important that you exercise regularly at least 30 minutes 5 times a week.  ?Think about what you will eat, plan ahead. ?Choose " clean, green, fresh or frozen" over canned, processed or packaged foods which are more sugary, salty and fatty. ?70 to 75% of food eaten should be vegetables and fruit. ?Three meals at set times with snacks allowed between meals, but they must be fruit or vegetables. ?Aim to eat over a 12 hour period , example 7 am to 7 pm, and STOP after  your last meal of the day. ?Drink water,generally about 64 ounces per day, no other drink is as healthy. Fruit juice is best enjoyed in a healthy way, by EATING the fruit. ? ?Thanks for choosing [facility], we consider it a privelige to serve you.  ?

## 2021-05-23 NOTE — Assessment & Plan Note (Signed)
Currently denies pain ?Takes tizanidine  2 mg as needed, last dose was yesterday.  Seldomly needs to take medication. ?

## 2021-05-23 NOTE — Assessment & Plan Note (Signed)
Lab Results  ?Component Value Date  ? CHOL 130 01/06/2021  ? HDL 49 01/06/2021  ? [place] 69 01/06/2021  ? TRIG 56 01/06/2021  ? CHOLHDL 3.4 07/28/2019  ?On Crestor 10 mg daily ?Check lipid panel ?

## 2021-05-23 NOTE — Progress Notes (Signed)
? ?Tristan Lopez     MRN: [id number]      DOB: Jun 16, 1930 ? ? ?HPI ?[name] with past medical history of A-fib, essential hypertension, COPD, GERD, BPH, hyperlipidemia is here for follow up and re-evaluation of chronic medical conditions, patient is accompanied to today's visit by Tristan Lopez.  ? ?BPH recently went to the ED for Foley catheter replacement on 4/10. Home health has been changing foley cather every 30 days , currently denies abdominal pain, hematuria states that Foley catheter has been draining appropriately.  ? ?A-fib .  On chronic Coumadin , states that home health has been checking INR, they have been sending the results to cardiology.  Denies bloody stool, bloody urine, hematemesis.  ? ?Indigestion/ acid reflux , has been taking protonix 51m BID , denies symptoms of acid reflux, patient told to cut down from Protonix 40 mg twice daily and  taking protonix 487mdaily.  ? ? ?Chronic hip pain.  Patient currently denies hip pain,takes tizanidine 29m17ms needed, has not needed med in months.   ? ?ROS ?Denies recent fever or chills. ?Denies sinus pressure, nasal congestion, ear pain or sore throat. ?Denies chest congestion, productive cough or wheezing. ?Denies chest pains, palpitations and leg swelling ?Denies abdominal pain, nausea, vomiting,diarrhea or constipation.   ?Denies dysuria, frequency, hesitancy or incontinence. ?Denies joint pain, swelling and limitation in mobility. ?Denies depression, anxiety or insomnia. ? ? ? ?PE ? ?BP 134/61 (BP Location: Right Arm, Patient Position: Sitting, Cuff Size: Large)   Pulse 68   Ht _0  (1.753 m)   Wt 208 lb (94.3 kg)   SpO2 98%   BMI 30.72 kg/m?  ? ?Patient alert and oriented and in no cardiopulmonary distress. ? ? ?Chest: Clear to auscultation bilaterally. ? ?CVS: S1, S2 no murmurs, no S3.Regular rate. ? ?ABD: Soft non tender.  ? ?Ext: No edema ? ?MS: Adequate ROM spine, shoulders, hips and knees. ? ?Skin: Intact, no ulcerations or rash  noted. ? ?Psych: Good eye contact, normal affect. Memory intact not anxious or depressed appearing. ? ?Assessment & Plan ? ?Essential hypertension ?BP Readings from Last 3 Encounters:  ?05/23/21 134/61  ?05/10/21 136/76  ?05/01/21 130/66  ?Chronic condition well-controlled on amlodipine 5 mg daily Lotensin 40 mg daily, propanolol 20 mg daily, ?Continue current medications DASH diet advised engage in walking exercises daily as tolerated. ?Follow-up in 4 months ?Check CMP ?Lab Results  ?Component Value Date  ? NA 141 01/06/2021  ? K 4.7 01/06/2021  ? CO2 26 01/06/2021  ? GLUCOSE 107 (H) 01/06/2021  ? BUN 20 01/06/2021  ? CREATININE 0.80 01/06/2021  ? CALCIUM 9.3 01/06/2021  ? EGFR 85 01/06/2021  ? GFRNONAA >60 11/30/2020  ? ? ?GERD (gastroesophageal reflux disease) ?Chronic condition well-controlled ?Has been taking Protonix 40 mg twice daily ?Patient told to start taking protonix  40 mg daily instead of twice daily ?Follow-up in 4 months ? ?Benign prostatic hyperplasia ?Has chronic catheter for urinary retention ?Has been having home health RN change catheter at home every 30 days ?Denies abdominal pain, hesitancy, states that Foley catheter is draining appropriately ? ?Hyperlipidemia ?Lab Results  ?Component Value Date  ? CHOL 130 01/06/2021  ? HDL 49 01/06/2021  ? LDLWillits 01/06/2021  ? TRIG 56 01/06/2021  ? CHOLHDL 3.4 07/28/2019  ?On Crestor 10 mg daily ?Check lipid panel ? ?Anemia ?Lab Results  ?Component Value Date  ? WBC 4.6 01/06/2021  ? HGB 11.4 (L) 01/06/2021  ? HCT 35.7 (  L) 01/06/2021  ? MCV 92 01/06/2021  ? PLT 204 01/06/2021  ?Chronic condition check iron panel. ?Denies hematuria, bloody stool,  ? ?Atrial fibrillation ([place]) ?Propanolol 20 mg daily Coumadin ?Appreciate collaboration with cardiology.  ? ?Chronic left hip pain ?Currently denies pain ?Takes tizanidine  2 mg as needed, last dose was yesterday.  Seldomly needs to take medication.  ?

## 2021-05-23 NOTE — Assessment & Plan Note (Addendum)
BP Readings from Last 3 Encounters:  ?05/23/21 134/61  ?05/10/21 136/76  ?05/01/21 130/66  ?Chronic condition well-controlled on amlodipine 5 mg daily Lotensin 40 mg daily, propanolol 20 mg daily, ?Continue current medications DASH diet advised engage in walking exercises daily as tolerated. ?Follow-up in 4 months ?Check CMP ?Lab Results  ?Component Value Date  ? NA 141 01/06/2021  ? K 4.7 01/06/2021  ? CO2 26 01/06/2021  ? GLUCOSE 107 (H) 01/06/2021  ? BUN 20 01/06/2021  ? CREATININE 0.80 01/06/2021  ? CALCIUM 9.3 01/06/2021  ? EGFR 85 01/06/2021  ? GFRNONAA >60 11/30/2020  ? ?

## 2021-05-23 NOTE — Assessment & Plan Note (Signed)
Lab Results  ?Component Value Date  ? WBC 4.6 01/06/2021  ? HGB 11.4 (L) 01/06/2021  ? HCT 35.7 (L) 01/06/2021  ? MCV 92 01/06/2021  ? PLT 204 01/06/2021  ?Chronic condition check iron panel. ?Denies hematuria, bloody stool,  ?

## 2021-05-23 NOTE — Assessment & Plan Note (Signed)
Has chronic catheter for urinary retention ?Has been having home health RN change catheter at home every 30 days ?Denies abdominal pain, hesitancy, states that Foley catheter is draining appropriately ?

## 2021-05-23 NOTE — Assessment & Plan Note (Signed)
Propanolol 20 mg daily Coumadin ?Appreciate collaboration with cardiology.  ?

## 2021-05-31 ENCOUNTER — Ambulatory Visit (INDEPENDENT_AMBULATORY_CARE_PROVIDER_SITE_OTHER): Payer: Medicare PPO | Admitting: Physician Assistant

## 2021-05-31 DIAGNOSIS — N481 Balanitis: Secondary | ICD-10-CM

## 2021-05-31 DIAGNOSIS — R339 Retention of urine, unspecified: Secondary | ICD-10-CM | POA: Diagnosis not present

## 2021-05-31 MED ORDER — CLOTRIMAZOLE 1 % EX CREA
1.0000 | TOPICAL_CREAM | Freq: Two times a day (BID) | CUTANEOUS | 0 refills | Status: DC
Start: 2021-05-31 — End: 2021-08-16

## 2021-05-31 NOTE — Progress Notes (Signed)
Cath Change/ Replacement ? ?Patient is present today for a catheter change due to urinary retention.  9m of water was removed from the balloon, a 16FR foley cath was removed with out difficulty.  Patient was cleaned and prepped in a sterile fashion with betadine. A 18 FR foley cath was replaced into the bladder no complications were noted.  Catheter was flushed with 52mof sterile water with [place].  The balloon was filled with 2m85m sterile water. A leg bag was attached for drainage.  A night bag was also given to the patient and patient was given instruction on how to change from one bag to another. Patient was given proper instruction on catheter care.   ? ?Performed by: KourLevi AlandA ? ?Follow up: Follow up as scheduled.   ?

## 2021-05-31 NOTE — Progress Notes (Signed)
? ?Assessment: ?1. Urinary retention ? ?2. Balanitis ?  ? ?Plan: ?Cath change today and Clotrimazole cream twice daily. Discussed tx of yeast and ways to prevent. FU in one month for cath change and recheck of glans/balanitis ? ?Chief Complaint: ?No chief complaint on file. ? ? ?HPI: ?Tristan Lopez is a 88 y.o. male who presents for continued evaluation of chronic urinary retention. Foley upsized to 69F last visit.  He is due for catheter change today and has had no issues with the Foley since last change.  No pain, no gross hematuria.  He does admit to discomfort at the urethral meatus for which she has used yeast cream in the past. ? ?05/10/21 ?Tristan Lopez is a 88yo here for followup for BPH with urinary retention. He is currently managed with an indwelling 16 french foley. Within the past 2-3 months he has been seen multiple times for a clogged foley due to sediment. He denies any UTIs. No hematuria or pelvic pain. No other complaints today ? ?Portions of the above documentation were copied from a prior visit for review purposes only. ? ?Allergies: ?Allergies  ?Allergen Reactions  ? Latex Rash  ? ? ?PMH: ?Past Medical History:  ?Diagnosis Date  ? Abdominal aortic aneurysm without rupture ([place])   ? Bilateral renal cysts   ? BPH (benign prostatic hyperplasia)   ? Carotid artery occlusion   ? Chronic atrial fibrillation ([facility])   ? COPD (chronic obstructive pulmonary disease) ([place])   ? Diverticulosis   ? Pancolonic  ? Essential hypertension   ? Hemorrhoids   ? Hiatal hernia   ? Hx of adenomatous colonic polyps   ? Hypercholesteremia   ? Liver cyst   ? Benign by liver biopsy September 2013  ? Nephrolithiasis   ? Nephrolithiasis   ? Permanent atrial fibrillation ([place])   ? Pre-diabetes   ? Reflux esophagitis   ? Sleep apnea   ? Uses CPAP  ? Tubular adenoma   ? ? ?PSH: ?Past Surgical History:  ?Procedure Laterality Date  ? COLONOSCOPY    ? 2003, no polyps per patient. Dr. West Carbo  ? COLONOSCOPY  11/01/11  ? Dr.  Gala Romney- tubular adenoma,suboptimal preparation, internal hemorrhoids, o/w normal rectum. pancolonic diverticulosis  ? COLONOSCOPY WITH PROPOFOL N/A 11/29/2020  ? Procedure: COLONOSCOPY WITH PROPOFOL;  Surgeon: Harvel Quale, MD;  [facility];  Service: Gastroenterology;  Laterality: N/A;  ? ESOPHAGOGASTRODUODENOSCOPY  11/01/11  ? Dr. Gala Romney- erosive reflux esophagitis along with a patulous EG junction, hialtal hernia, antral erosions with possible area of healing ulceration- s/p bx= chronic erosive gastritis, no malignancy  ? ESOPHAGOGASTRODUODENOSCOPY (EGD) WITH PROPOFOL N/A 11/29/2020  ? Procedure: ESOPHAGOGASTRODUODENOSCOPY (EGD) WITH PROPOFOL;  Surgeon: Harvel Quale, MD;  [facility];  Service: Gastroenterology;  Laterality: N/A;  ? HERNIA REPAIR    ? x2, bilateral inguinal   ? KIDNEY STONE SURGERY    ? KNEE ARTHROSCOPY    ? X2  ? NASAL ENDOSCOPY    ? ? ?SH: ?Social History  ? ?Tobacco Use  ? Smoking status: Former  ?  Packs/day: 0.50  ?  Types: Cigarettes  ?  Quit date: 39  ?  Years since quitting: 31.3  ? Smokeless tobacco: Never  ?Vaping Use  ? Vaping Use: Never used  ?Substance Use Topics  ? Alcohol use: No  ? Drug use: No  ? ? ?ROS: ?Constitutional:  Negative for fever, chills, weight loss ?CV: Negative for chest pain ?Respiratory:  Negative  for shortness of breath, wheezing, sleep apnea, frequent cough ?GI:  Negative for nausea, vomiting, bloody stool, GERD ? ?PE: ?There were no vitals taken for this visit. ?GENERAL APPEARANCE:  Well appearing, well developed, well nourished, NAD ?HEENT:  Atraumatic, normocephalic ?NECK:  Supple. Trachea midline ?ABDOMEN:  Soft, non-tender, no masses ?GU: The urethral meatus and glans penis are significantly erythematous with moist discharge surrounding it.  No obvious urethral discharge noted.  Remainder of genitalia and surrounding soft tissues are dry without lesions. ? ? ?Results: ?Laboratory Data: ?Lab Results  ?Component  Value Date  ? WBC 4.6 01/06/2021  ? HGB 11.4 (L) 01/06/2021  ? HCT 35.7 (L) 01/06/2021  ? MCV 92 01/06/2021  ? PLT 204 01/06/2021  ? ? ?Lab Results  ?Component Value Date  ? CREATININE 0.80 01/06/2021  ? ? ?No results found for: PSA ? ?No results found for: TESTOSTERONE ? ?Lab Results  ?Component Value Date  ? HGBA1C 5.2 01/06/2021  ? ? ?Urinalysis ?   ?Component Value Date/Time  ? [place] (A) 03/10/2021 1134  ? APPEARANCEUR Cloudy (A) 05/10/2021 1001  ? LABSPEC 1.016 03/10/2021 1134  ? PHURINE 8.0 03/10/2021 1134  ? GLUCOSEU Negative 05/10/2021 1001  ? [place] NEGATIVE 03/10/2021 1134  ? BILIRUBINUR Negative 05/10/2021 1001  ? KETONESUR 5 (A) 03/10/2021 1134  ? PROTEINUR 2+ (A) 05/10/2021 1001  ? PROTEINUR 100 (A) 03/10/2021 1134  ? UROBILINOGEN 0.2 10/04/2011 2218  ? NITRITE Positive (A) 05/10/2021 1001  ? NITRITE NEGATIVE 03/10/2021 1134  ? LEUKOCYTESUR 2+ (A) 05/10/2021 1001  ? LEUKOCYTESUR SMALL (A) 03/10/2021 1134  ? ? ?Lab Results  ?Component Value Date  ? LABMICR See below: 05/10/2021  ? WBCUA 11-30 (A) 05/10/2021  ? LABEPIT 0-10 05/10/2021  ? MUCUS Present 05/10/2021  ? BACTERIA Many (A) 05/10/2021  ? ? ?Pertinent Imaging: ?No results found for this or any previous visit. ? ?No results found for this or any previous visit. ? ?No results found for this or any previous visit. ? ?No results found for this or any previous visit. ? ?No results found for this or any previous visit. ? ?No results found for this or any previous visit. ? ?No results found for this or any previous visit. ? ?No results found for this or any previous visit. ? ?No results found for this or any previous visit (from the past 24 hour(s)).  ?

## 2021-06-14 LAB — PROTIME-INR: INR: 1.9 — AB (ref 0.80–1.20)

## 2021-06-20 ENCOUNTER — Ambulatory Visit (INDEPENDENT_AMBULATORY_CARE_PROVIDER_SITE_OTHER): Payer: Medicare PPO | Admitting: *Deleted

## 2021-06-20 ENCOUNTER — Encounter: Payer: Self-pay | Admitting: Cardiology

## 2021-06-20 DIAGNOSIS — Z5181 Encounter for therapeutic drug level monitoring: Secondary | ICD-10-CM

## 2021-06-20 DIAGNOSIS — I4891 Unspecified atrial fibrillation: Secondary | ICD-10-CM

## 2021-06-20 NOTE — Patient Instructions (Signed)
Just received this INR result today Pt is in independent living facility in [place].  Gets labs done at [facility] in [place].  Son Glendell Docker manages pts medicine.  Lab orders were mailed to [place] address. Take warfarin '5mg'$  tonight then continue 5 mg daily except 2.'5mg'$  on Sundays, Tuesdays and Thursdays Recheck wk of 07/17/21. My chart message sent to Glendell Docker (son).  Coumadin instructions given and he verbalized understanding.

## 2021-06-21 DIAGNOSIS — I10 Essential (primary) hypertension: Secondary | ICD-10-CM | POA: Diagnosis not present

## 2021-06-21 DIAGNOSIS — R1030 Lower abdominal pain, unspecified: Secondary | ICD-10-CM | POA: Insufficient documentation

## 2021-06-21 DIAGNOSIS — T83098A Other mechanical complication of other indwelling urethral catheter, initial encounter: Secondary | ICD-10-CM | POA: Diagnosis not present

## 2021-06-21 DIAGNOSIS — Z87891 Personal history of nicotine dependence: Secondary | ICD-10-CM | POA: Insufficient documentation

## 2021-06-21 DIAGNOSIS — J449 Chronic obstructive pulmonary disease, unspecified: Secondary | ICD-10-CM | POA: Insufficient documentation

## 2021-06-22 ENCOUNTER — Emergency Department (HOSPITAL_COMMUNITY)
Admission: EM | Admit: 2021-06-22 | Discharge: 2021-06-22 | Disposition: A | Discharge status: Home / Self Care | Payer: Medicare PPO | Attending: Emergency Medicine | Admitting: Emergency Medicine

## 2021-06-22 ENCOUNTER — Encounter (HOSPITAL_COMMUNITY): Payer: Self-pay

## 2021-06-22 ENCOUNTER — Other Ambulatory Visit: Payer: Self-pay

## 2021-06-22 DIAGNOSIS — T839XXA Unspecified complication of genitourinary prosthetic device, implant and graft, initial encounter: Secondary | ICD-10-CM

## 2021-06-22 NOTE — Discharge Instructions (Addendum)
You were evaluated in the Emergency Department and after careful evaluation, we did not find any emergent condition requiring admission or further testing in the hospital.  Your exam/testing today is overall reassuring.  Recommend close follow-up with your regular doctors for continued care.  Please return to the Emergency Department if you experience any worsening of your condition.   Thank you for allowing [place] to be a part of your care.

## 2021-06-22 NOTE — ED Notes (Signed)
Pt's foley bag changed to leg bag at discharge

## 2021-06-22 NOTE — ED Triage Notes (Incomplete)
Pov from home with family. Ambulatory to lobby. Cc of clogged foley. Supposed to get it replaced next week.

## 2021-06-22 NOTE — ED Notes (Signed)
ED Provider at bedside. 

## 2021-06-22 NOTE — ED Provider Notes (Signed)
[facility] Emergency Department Provider Note MRN:  [id number]  Arrival date & time: 06/22/21     Chief Complaint   Foley Problems    History of Present Illness   Tristan Lopez is a 88 y.o. year-old male with a history of BPH presenting to the ED with chief complaint of Foley problem.  Foley catheter not draining any urine since about 6 PM.  Was not sure if it was clogged or if he just was not having any urine.  Then he started having some lower abdominal fullness.  Feels a lot better now that the Foley has been replaced.  Review of Systems  A thorough review of systems was obtained and all systems are negative except as noted in the HPI and PMH.   Patient's Health History    Past Medical History:  Diagnosis Date   Abdominal aortic aneurysm without rupture ([facility])    Bilateral renal cysts    BPH (benign prostatic hyperplasia)    Carotid artery occlusion    Chronic atrial fibrillation ([facility])    COPD (chronic obstructive pulmonary disease) ([place])    Diverticulosis    Pancolonic   Essential hypertension    Hemorrhoids    Hiatal hernia    Hx of adenomatous colonic polyps    Hypercholesteremia    Liver cyst    Benign by liver biopsy September 2013   Nephrolithiasis    Nephrolithiasis    Permanent atrial fibrillation ([place])    Pre-diabetes    Reflux esophagitis    Sleep apnea    Uses CPAP   Tubular adenoma     Past Surgical History:  Procedure Laterality Date   COLONOSCOPY     2003, no polyps per patient. Dr. West Carbo   COLONOSCOPY  11/01/11   Dr. Gala Romney- tubular adenoma,suboptimal preparation, internal hemorrhoids, o/w normal rectum. pancolonic diverticulosis   COLONOSCOPY WITH PROPOFOL N/A 11/29/2020   Procedure: COLONOSCOPY WITH PROPOFOL;  Surgeon: Harvel Quale, MD;  [facility];  Service: Gastroenterology;  Laterality: N/A;   ESOPHAGOGASTRODUODENOSCOPY  11/01/11   Dr. Gala Romney- erosive reflux esophagitis along with a  patulous EG junction, hialtal hernia, antral erosions with possible area of healing ulceration- s/p bx= chronic erosive gastritis, no malignancy   ESOPHAGOGASTRODUODENOSCOPY (EGD) WITH PROPOFOL N/A 11/29/2020   Procedure: ESOPHAGOGASTRODUODENOSCOPY (EGD) WITH PROPOFOL;  Surgeon: Harvel Quale, MD;  [facility];  Service: Gastroenterology;  Laterality: N/A;   HERNIA REPAIR     x2, bilateral inguinal    KIDNEY STONE SURGERY     KNEE ARTHROSCOPY     X2   NASAL ENDOSCOPY      Family History  Problem Relation Age of Onset   Breast cancer Mother    Other Mother        Essential tremor   Stroke Mother    Heart disease Father        Pacemaker   Heart attack Maternal Grandfather    Renal cancer Son    Colon cancer Neg Hx    Liver disease Neg Hx    Prostate cancer Neg Hx    Bladder Cancer Neg Hx    Kidney cancer Neg Hx     Social History   Socioeconomic History   Marital status: Married    Spouse name: Not on file   Number of children: 3   Years of education: Not on file   Highest education level: Not on file  Occupational History    Employer: RETIRED  Occupation: Retired- Radiation protection practitioner and farming  Tobacco Use   Smoking status: Former    Packs/day: 0.50    Types: Cigarettes    Quit date: 1992    Years since quitting: 31.4   Smokeless tobacco: Never  Vaping Use   Vaping Use: Never used  Substance and Sexual Activity   Alcohol use: No   Drug use: No   Sexual activity: Not Currently  Other Topics Concern   Not on file  Social History Narrative   Married for 68 years.Retired.No longer in Assisted Living, he came home after wife went to memory care. [facility] for memory care as of Dec 2021.   Social Determinants of Health   Financial Resource Strain: Not on file  Food Insecurity: Not on file  Transportation Needs: Not on file  Physical Activity: Not on file  Stress: Not on file  Social Connections: Not on file   Intimate Partner Violence: Not on file     Physical Exam   Vitals:   06/22/21 0100 06/22/21 0116  BP: (!) 147/71   Pulse: 77   Resp: 18   Temp:  98.5 F (36.9 C)  SpO2: 100%     CONSTITUTIONAL: Well-appearing, NAD NEURO/PSYCH:  Alert and oriented x 3, no focal deficits EYES:  eyes equal and reactive ENT/NECK:  no LAD, no JVD CARDIO: Regular rate, well-perfused, normal S1 and S2 PULM:  CTAB no wheezing or rhonchi GI/GU:  non-distended, non-tender MSK/SPINE:  No gross deformities, no edema SKIN:  no rash, atraumatic   *Additional and/or pertinent findings included in MDM below  Diagnostic and Interventional Summary    EKG Interpretation  Date/Time:    Ventricular Rate:    PR Interval:    QRS Duration:   QT Interval:    QTC Calculation:   R Axis:     Text Interpretation:         Labs Reviewed  URINE CULTURE    No orders to display    Medications - No data to display   Procedures  /  Critical Care Procedures  ED Course and Medical Decision Making  Initial Impression and Ddx Suspect sediment in the indwelling Foley which had been present for 1 month causing obstruction, patient's symptoms are resolved at this time, benign abdomen, no fevers recently.  We will send urine for culture, appropriate for discharge  Past medical/surgical history that increases complexity of ED encounter: BPH  Interpretation of Diagnostics Not applicable  Patient Reassessment and Ultimate Disposition/Management     Discharge  Patient management required discussion with the following services or consulting groups:  None  Complexity of Problems Addressed Acute illness or injury that poses threat of life of bodily function  Additional Data Reviewed and Analyzed Further history obtained from: Further history from spouse/family member  Additional Factors Impacting ED Encounter Risk None  Barth Kirks. [place]onia Small, [place] [email or web address]  Final Clinical Impressions(s) / ED Diagnoses     ICD-10-CM   1. Problem with Foley catheter, initial encounter ([place])  [email or web address]       ED Discharge Orders     None        Discharge Instructions Discussed with and Provided to Patient:    Discharge Instructions      You were evaluated in the Emergency Department and after careful evaluation, we did not find any emergent condition requiring admission or further testing in the hospital.  Your exam/testing today is overall reassuring.  Recommend close follow-up with your regular doctors for continued care.  Please return to the Emergency Department if you experience any worsening of your condition.   Thank you for allowing [place] to be a part of your care.      Maudie Flakes, MD 06/22/21 [phone]

## 2021-06-24 ENCOUNTER — Encounter: Payer: Self-pay | Admitting: Urology

## 2021-06-24 LAB — URINE CULTURE: Culture: >=100000 — AB

## 2021-06-25 ENCOUNTER — Telehealth (HOSPITAL_BASED_OUTPATIENT_CLINIC_OR_DEPARTMENT_OTHER): Payer: Self-pay | Admitting: *Deleted

## 2021-06-25 NOTE — Progress Notes (Signed)
ED Antimicrobial Stewardship Positive Culture Follow Up   Tristan Lopez is an 88 y.o. male who presented to [facility] on 06/22/2021 with a chief complaint of  Chief Complaint  Patient presents with   Foley Problems     Recent Results (from the past 720 hour(s))  Urine Culture     Status: Abnormal   Collection Time: 06/22/21  1:47 AM   Specimen: Urine, Catheterized  Result Value Ref Range Status   Specimen Description   Final    URINE, CATHETERIZED Performed at [facility], [address], [place], [place] [address]    Special Requests   Final    NONE Performed at [facility], [address] [place], [place] [address]    Culture >=100,000 COLONIES/mL PROTEUS MIRABILIS (A)  Final   Report Status 06/24/2021 FINAL  Final   Organism ID, Bacteria PROTEUS MIRABILIS (A)  Final      Susceptibility   Proteus mirabilis - MIC*    AMPICILLIN <=2 SENSITIVE Sensitive     CEFAZOLIN <=4 SENSITIVE Sensitive     CEFEPIME <=0.12 SENSITIVE Sensitive     CEFTRIAXONE <=0.25 SENSITIVE Sensitive     CIPROFLOXACIN <=0.25 SENSITIVE Sensitive     GENTAMICIN <=1 SENSITIVE Sensitive     IMIPENEM 4 SENSITIVE Sensitive     NITROFURANTOIN 128 RESISTANT Resistant     TRIMETH/SULFA <=20 SENSITIVE Sensitive     AMPICILLIN/SULBACTAM <=2 SENSITIVE Sensitive     PIP/TAZO <=4 SENSITIVE Sensitive     * >=100,000 COLONIES/mL PROTEUS MIRABILIS   '[x]'$  No action needed per MD Raymon Mutton, PharmD, BCPS 06/25/2021 11:48 AM ED Clinical Pharmacist -  [phone]

## 2021-06-25 NOTE — Telephone Encounter (Signed)
Post ED Visit - Positive Culture Follow-up  Culture report reviewed by antimicrobial stewardship pharmacist: [place] Team '[]'$  Elenor Quinones, Pharm.D. '[]'$  Heide Guile, Pharm.D., BCPS AQ-ID '[]'$  Parks Neptune, Pharm.D., BCPS '[]'$  Alycia Rossetti, Pharm.D., BCPS '[]'$  [place], [place].D., BCPS, AAHIVP '[]'$  Legrand Como, Pharm.D., BCPS, AAHIVP '[]'$  Salome Arnt, PharmD, BCPS '[]'$  Johnnette Gourd, PharmD, BCPS '[]'$  Hughes Better, PharmD, BCPS '[]'$  Leeroy Cha, PharmD '[]'$  Laqueta Linden, PharmD, BCPS '[x]'$  Lorelei Pont, PharmD  [place] Team '[]'$  Leodis Sias, PharmD '[]'$  Lindell Spar, PharmD '[]'$  Royetta Asal, PharmD '[]'$  Graylin Shiver, Rph '[]'$  Rema Fendt) Glennon Mac, PharmD '[]'$  Arlyn Dunning, PharmD '[]'$  Netta Cedars, PharmD '[]'$  Dia Sitter, PharmD '[]'$  Leone Haven, PharmD '[]'$  Gretta Arab, PharmD '[]'$  Theodis Shove, PharmD '[]'$  Peggyann Juba, PharmD '[]'$  Reuel Boom, PharmD   Positive urine culture No further patient follow-up is required at this time per Dr. Deno Etienne  Rosie Fate 06/25/2021, 2:08 PM

## 2021-06-26 NOTE — Telephone Encounter (Signed)
Please see patient son request - urine culture from ER

## 2021-06-28 NOTE — Telephone Encounter (Signed)
No treatment started

## 2021-06-29 ENCOUNTER — Other Ambulatory Visit: Payer: Self-pay

## 2021-06-29 MED ORDER — CEFUROXIME AXETIL 250 MG PO TABS: 250.0000 mg | ORAL_TABLET | Freq: Two times a day (BID) | ORAL | 0 refills | Status: DC

## 2021-06-30 ENCOUNTER — Ambulatory Visit: Payer: Medicare PPO | Admitting: Physician Assistant

## 2021-07-03 ENCOUNTER — Other Ambulatory Visit: Payer: Self-pay | Admitting: Nurse Practitioner

## 2021-07-03 DIAGNOSIS — I10 Essential (primary) hypertension: Secondary | ICD-10-CM

## 2021-07-06 ENCOUNTER — Other Ambulatory Visit: Payer: Self-pay

## 2021-07-06 MED ORDER — TADALAFIL 5 MG PO TABS: 5.0000 mg | ORAL_TABLET | Freq: Every day | ORAL | 5 refills | Status: DC

## 2021-07-10 DIAGNOSIS — I482 Chronic atrial fibrillation, unspecified: Secondary | ICD-10-CM | POA: Diagnosis not present

## 2021-07-10 DIAGNOSIS — Z7901 Long term (current) use of anticoagulants: Secondary | ICD-10-CM | POA: Diagnosis not present

## 2021-07-10 LAB — POCT INR: INR: 1.7 — AB (ref 0.80–1.20)

## 2021-07-11 ENCOUNTER — Ambulatory Visit: Payer: Medicare PPO | Admitting: Internal Medicine

## 2021-07-14 ENCOUNTER — Ambulatory Visit: Payer: Medicare PPO | Admitting: Internal Medicine

## 2021-07-18 ENCOUNTER — Ambulatory Visit: Payer: Medicare PPO | Admitting: Physician Assistant

## 2021-07-18 ENCOUNTER — Ambulatory Visit: Payer: Medicare PPO | Admitting: Internal Medicine

## 2021-07-19 ENCOUNTER — Encounter: Payer: Self-pay | Admitting: Nurse Practitioner

## 2021-07-20 ENCOUNTER — Ambulatory Visit (INDEPENDENT_AMBULATORY_CARE_PROVIDER_SITE_OTHER): Payer: Medicare PPO | Admitting: Physician Assistant

## 2021-07-20 DIAGNOSIS — R339 Retention of urine, unspecified: Secondary | ICD-10-CM | POA: Diagnosis not present

## 2021-07-20 NOTE — Progress Notes (Signed)
Cath Change/ Replacement  Patient is present today for a catheter change due to urinary retention.  8m of water was removed from the balloon, a 16FR coude foley cath was removed with out difficulty.  Patient was cleaned and prepped in a sterile fashion with betadine. A 18 FR foley cath was replaced into the bladder no complications were noted Urine return was noted 637mand urine was yellow in color. The balloon was filled with 1032mf sterile water. A leg bag was attached for drainage.  A night bag was also given to the patient and patient was given instruction on how to change from one bag to another. Patient was given proper instruction on catheter care.    Performed by: Lundon Rosier LPN  Follow up: 1 month [place]

## 2021-07-21 ENCOUNTER — Encounter: Payer: Self-pay | Admitting: Cardiology

## 2021-07-21 ENCOUNTER — Ambulatory Visit (INDEPENDENT_AMBULATORY_CARE_PROVIDER_SITE_OTHER): Payer: Medicare PPO | Admitting: *Deleted

## 2021-07-21 DIAGNOSIS — Z5181 Encounter for therapeutic drug level monitoring: Secondary | ICD-10-CM

## 2021-07-21 DIAGNOSIS — I4891 Unspecified atrial fibrillation: Secondary | ICD-10-CM

## 2021-07-27 ENCOUNTER — Other Ambulatory Visit: Payer: Self-pay | Admitting: Nurse Practitioner

## 2021-08-01 ENCOUNTER — Encounter: Payer: Self-pay | Admitting: Internal Medicine

## 2021-08-01 ENCOUNTER — Ambulatory Visit: Payer: Medicare PPO | Admitting: Internal Medicine

## 2021-08-01 VITALS — BP 152/74 | HR 73 | Temp 98.1°F | Ht 69.0 in | Wt 217.8 lb

## 2021-08-01 DIAGNOSIS — K219 Gastro-esophageal reflux disease without esophagitis: Secondary | ICD-10-CM | POA: Diagnosis not present

## 2021-08-01 DIAGNOSIS — K922 Gastrointestinal hemorrhage, unspecified: Secondary | ICD-10-CM

## 2021-08-01 NOTE — Progress Notes (Signed)
Primary Care Physician:  Renee Rival, FNP Primary Gastroenterologist:  Dr. Gala Romney  Pre-Procedure History & Physical: HPI:  Tristan Lopez is a 88 y.o. male here for follow-up.  Patient is chronically anticoagulated on warfarin for atrial fibrillation.  Patient presented last year with a hemodynamically significant, multiunit (5) GI bleed (Ultimately, found to be due to a bleeding colonic diverticulum which was successfully treated with endoscopic therapy).  Clinically, he has done well since that time.  No further melena or rectal bleeding reflux well controlled on Protonix 40 mg daily  His son accompanies him today he endorses he is doing well.  Last hemoglobin from about 6 months ago was 11.4-on the rebound.  Past Medical History:  Diagnosis Date   Abdominal aortic aneurysm without rupture (HCC)    Bilateral renal cysts    BPH (benign prostatic hyperplasia)    Carotid artery occlusion    Chronic atrial fibrillation (HCC)    COPD (chronic obstructive pulmonary disease) ([place])    Diverticulosis    Pancolonic   Essential hypertension    Hemorrhoids    Hiatal hernia    Hx of adenomatous colonic polyps    Hypercholesteremia    Liver cyst    Benign by liver biopsy September 2013   Nephrolithiasis    Nephrolithiasis    Permanent atrial fibrillation (HCC)    Pre-diabetes    Reflux esophagitis    Sleep apnea    Uses CPAP   Tubular adenoma     Past Surgical History:  Procedure Laterality Date   COLONOSCOPY     2003, no polyps per patient. Dr. West Carbo   COLONOSCOPY  11/01/11   Dr. Gala Romney- tubular adenoma,suboptimal preparation, internal hemorrhoids, o/w normal rectum. pancolonic diverticulosis   COLONOSCOPY WITH PROPOFOL N/A 11/29/2020   Procedure: COLONOSCOPY WITH PROPOFOL;  Surgeon: Harvel Quale, MD;  [facility]: [facility];  Service: Gastroenterology;  Laterality: N/A;   ESOPHAGOGASTRODUODENOSCOPY  11/01/11   Dr. Gala Romney- erosive reflux esophagitis  along with a patulous EG junction, hialtal hernia, antral erosions with possible area of healing ulceration- s/p bx= chronic erosive gastritis, no malignancy   ESOPHAGOGASTRODUODENOSCOPY (EGD) WITH PROPOFOL N/A 11/29/2020   Procedure: ESOPHAGOGASTRODUODENOSCOPY (EGD) WITH PROPOFOL;  Surgeon: Harvel Quale, MD;  Location: [facility];  Service: Gastroenterology;  Laterality: N/A;   HERNIA REPAIR     x2, bilateral inguinal    KIDNEY STONE SURGERY     KNEE ARTHROSCOPY     X2   NASAL ENDOSCOPY      Prior to Admission medications   Medication Sig Start Date End Date Taking? Authorizing Provider  amLODipine (NORVASC) 5 MG tablet Take 1 tablet (5 mg total) by mouth daily. 01/20/21  Yes Noreene Larsson, NP  Ascorbic Acid (VITAMIN C) 1000 MG tablet Take 1,000 mg by mouth daily.   Yes [provider]  bacitracin 500 UNIT/GM ointment Apply 1 application. topically 2 (two) times daily. 05/10/21  Yes McKenzie, Candee Furbish, MD  benazepril (LOTENSIN) 40 MG tablet Take 1 tablet (40 mg total) by mouth daily. 03/14/21  Yes Strader, [place] M, PA-C  cefUROXime (CEFTIN) 250 MG tablet Take 1 tablet (250 mg total) by mouth 2 (two) times daily with a meal. 06/29/21  Yes McKenzie, Candee Furbish, MD  Cholecalciferol (VITAMIN D-3) 1000 units CAPS Take 2,000 Units by mouth daily.   Yes [provider]  clotrimazole (LOTRIMIN) 1 % cream Apply 1 application. topically 2 (two) times daily. 05/31/21  Yes Summerlin, Berneice Heinrich,  PA-C  furosemide (LASIX) 20 MG tablet Take 1 tablet (20 mg total) by mouth daily. Take 20 mg daily as needed for leg swelling 05/09/21  Yes Paseda, Dewaine Conger, FNP  Garlic 9417 MG CAPS Take 1,000 mg by mouth daily.   Yes [provider]  pantoprazole (PROTONIX) 40 MG tablet Take 1 tablet (40 mg total) by mouth daily. 05/23/21  Yes Paseda, Dewaine Conger, FNP  potassium chloride (KLOR-CON) 10 MEQ tablet TAKE 1 TABLET (10 MEQ TOTAL) BY MOUTH DAILY. 07/03/21  Yes Paseda,  Dewaine Conger, FNP  Probiotic Product (DIGESTIVE ADVANTAGE PO) Take by mouth 2 (two) times daily.    Yes [provider]  propranolol (INDERAL) 20 MG tablet Take 1 tablet (20 mg total) by mouth daily. 03/14/21  Yes Strader, [place] M, PA-C  rosuvastatin (CRESTOR) 10 MG tablet Take 1 tablet (10 mg total) by mouth daily. 03/14/21  Yes Strader, [place] M, PA-C  tadalafil (CIALIS) 5 MG tablet Take 1 tablet (5 mg total) by mouth daily. 07/06/21  Yes Paseda, Dewaine Conger, FNP  tiZANidine (ZANAFLEX) 2 MG tablet Take by mouth every 6 (six) hours as needed for muscle spasms.   Yes [provider]  warfarin (COUMADIN) 5 MG tablet TAKE 1 TABLET (5 MG TOTAL) BY MOUTH DAILY. 07/27/21  Yes Renee Rival, FNP    Allergies as of 08/01/2021 - Review Complete 08/01/2021  Allergen Reaction Noted   Latex Rash 10/04/2011    Family History  Problem Relation Age of Onset   Breast cancer Mother    Other Mother        Essential tremor   Stroke Mother    Heart disease Father        Pacemaker   Heart attack Maternal Grandfather    Renal cancer Son    Colon cancer Neg Hx    Liver disease Neg Hx    Prostate cancer Neg Hx    Bladder Cancer Neg Hx    Kidney cancer Neg Hx     Social History   Socioeconomic History   Marital status: Married    Spouse name: Not on file   Number of children: 3   Years of education: Not on file   Highest education level: Not on file  Occupational History    Employer: RETIRED   Occupation: Retired- Radiation protection practitioner and farming  Tobacco Use   Smoking status: Former    Packs/day: 0.50    Types: Cigarettes    Quit date: 1992    Years since quitting: 31.5   Smokeless tobacco: Never  Vaping Use   Vaping Use: Never used  Substance and Sexual Activity   Alcohol use: No   Drug use: No   Sexual activity: Not Currently  Other Topics Concern   Not on file  Social History Narrative   Married for 68 years.Retired.No longer in Assisted Living, he came home  after wife went to memory care. [facility] for memory care as of Dec 2021.   Social Determinants of Health   Financial Resource Strain: Not on file  Food Insecurity: Not on file  Transportation Needs: Not on file  Physical Activity: Not on file  Stress: Not on file  Social Connections: Not on file  Intimate Partner Violence: Not on file    Review of Systems: See HPI, otherwise negative ROS  Physical Exam: BP (!) 152/74 (BP Location: Left Arm, Patient Position: Sitting, Cuff Size: Normal)   Pulse 73   Temp 98.1 F (36.7  C) (Temporal)   Ht '5\' 9"'$  (1.753 m)   Wt 217 lb 12.8 oz (98.8 kg)   SpO2 98%   BMI 32.16 kg/m  General:   Alert,  , well-nourished, pleasant and cooperative in NAD Neck:  Supple; no masses or thyromegaly. No significant cervical adenopathy. Lungs:  Clear throughout to auscultation.   No wheezes, crackles, or rhonchi. No acute distress. Heart: Irregularly irregular rhythm with a 2/6 systolic ejection murmur clicks, rubs,  or gallops. Abdomen: Non-distended, normal bowel sounds.  Soft and nontender without appreciable mass or hepatosplenomegaly.   Impression/Plan: 88 year old gentleman chronically anticoagulated for atrial fibrillation presented last year with a hemodynamically significant, multiunit GI bleed -  ultimately found to be due to a bleeding colonic diverticulum.  Fortunately, effective endoscopic bleeding control therapy was employed.  Clinically, doing well.  GERD well-controlled on Protonix.  Currently, no GI symptoms.  Recommendations:  As discussed, no future colonoscopy or endoscopy is needed so long as you do not develop any new symptoms  Continue Protonix 40 mg daily -best taken 30 minutes before breakfast.  We will plan to see you back in 1 year and as needed.      Notice: This dictation was prepared with Dragon dictation along with smaller phrase technology. Any transcriptional errors that result from this  process are unintentional and may not be corrected upon review.

## 2021-08-01 NOTE — Patient Instructions (Addendum)
It was good to see you again today!  Glad to see you are doing well!  As discussed, no future colonoscopy or endoscopy is needed so long as you do not develop any new symptoms  Continue Protonix 40 mg daily -best taken 30 minutes before breakfast.  We will plan to see you back in 1 year and as needed.

## 2021-08-15 ENCOUNTER — Other Ambulatory Visit: Payer: Self-pay | Admitting: *Deleted

## 2021-08-15 DIAGNOSIS — Z5181 Encounter for therapeutic drug level monitoring: Secondary | ICD-10-CM

## 2021-08-15 DIAGNOSIS — I4821 Permanent atrial fibrillation: Secondary | ICD-10-CM

## 2021-08-16 ENCOUNTER — Ambulatory Visit (INDEPENDENT_AMBULATORY_CARE_PROVIDER_SITE_OTHER): Payer: Medicare PPO | Admitting: Physician Assistant

## 2021-08-16 DIAGNOSIS — N481 Balanitis: Secondary | ICD-10-CM

## 2021-08-16 DIAGNOSIS — R339 Retention of urine, unspecified: Secondary | ICD-10-CM | POA: Diagnosis not present

## 2021-08-16 MED ORDER — CLOTRIMAZOLE 1 % EX CREA: 1.0000 | TOPICAL_CREAM | Freq: Two times a day (BID) | CUTANEOUS | 0 refills | Status: DC

## 2021-08-16 NOTE — Progress Notes (Signed)
Assessment: 1. Urinary retention - INSERT,TEMP INDWELLING BLAD CATH,SIMPLE - Foley catheter - discontinue  2. Balanitis    Plan: Clotrimazole prescribed and patient advised on balanitis and how to use the cream is well as ways to decrease the risk by keeping the area dry and cool.  Catheter changed today without difficulty and he will follow-up in 1 month for next catheter change.  Chief Complaint: No chief complaint on file.   HPI: Tristan Lopez is a 88 y.o. male who presents for continued evaluation of urinary retention..   Portions of the above documentation were copied from a prior visit for review purposes only.  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 Dr. West Carbo   COLONOSCOPY  11/01/11   Dr. Gala Romney- tubular adenoma,suboptimal preparation, internal hemorrhoids, o/w normal rectum. pancolonic diverticulosis   COLONOSCOPY WITH PROPOFOL N/A 11/29/2020   Procedure: COLONOSCOPY WITH PROPOFOL;  Surgeon: Harvel Quale, MD;  [facility]: [facility];  Service: Gastroenterology;  Laterality: N/A;   ESOPHAGOGASTRODUODENOSCOPY  11/01/11   Dr. Gala Romney- erosive reflux esophagitis along with a  patulous EG junction, hialtal hernia, antral erosions with possible area of healing ulceration- s/p bx= chronic erosive gastritis, no malignancy   ESOPHAGOGASTRODUODENOSCOPY (EGD) WITH PROPOFOL N/A 11/29/2020   Procedure: ESOPHAGOGASTRODUODENOSCOPY (EGD) WITH PROPOFOL;  Surgeon: Harvel Quale, MD;  [facility];  Service: Gastroenterology;  Laterality: N/A;   HERNIA REPAIR     x2, bilateral inguinal    KIDNEY STONE SURGERY     KNEE ARTHROSCOPY     X2   NASAL ENDOSCOPY      SH: Social History   Tobacco Use   Smoking status: Former    Packs/day: 0.50    Types: Cigarettes    Quit date: 1992    Years since quitting: 31.5   Smokeless tobacco: Never  Vaping Use   Vaping Use: Never used  Substance Use Topics   Alcohol use: No   Drug use: No    ROS: All other review of systems were reviewed and are negative except what is noted above in HPI  PE: There were no vitals taken for this visit. GENERAL APPEARANCE:  Well appearing, well developed, well nourished, NAD HEENT:  Atraumatic, normocephalic NECK:  Supple. Trachea midline ABDOMEN:  Soft, non-tender, no masses EXTREMITIES:  Moves all extremities well, without clubbing, cyanosis, or edema NEUROLOGIC:  Alert and oriented x 3, normal gait, CN II-XII grossly intact MENTAL STATUS:  appropriate BACK:  Non-tender to palpation, No CVAT SKIN:  Warm, dry, and intact  Results: Laboratory Data: Lab Results  Component Value Date   WBC 4.6 01/06/2021   HGB 11.4 (L) 01/06/2021   HCT 35.7 (L) 01/06/2021   MCV 92 01/06/2021   PLT 204 01/06/2021    Lab Results  Component Value Date   CREATININE 0.80 01/06/2021    No results found for: "PSA"  No results found for: "TESTOSTERONE"  Lab Results  Component Value Date   HGBA1C 5.2 01/06/2021    Urinalysis    Component Value Date/Time   COLORURINE AMBER (A) 03/10/2021 1134   APPEARANCEUR Cloudy (A) 05/10/2021 1001   LABSPEC 1.016 03/10/2021 1134    PHURINE 8.0 03/10/2021 1134   GLUCOSEU Negative 05/10/2021 1001   HGBUR NEGATIVE 03/10/2021 1134   BILIRUBINUR Negative 05/10/2021 1001   KETONESUR 5 (A) 03/10/2021 1134   PROTEINUR 2+ (A) 05/10/2021 1001   PROTEINUR 100 (A) 03/10/2021 1134   UROBILINOGEN 0.2 10/04/2011 2218   NITRITE Positive (A) 05/10/2021 1001   NITRITE NEGATIVE 03/10/2021 1134   LEUKOCYTESUR 2+ (A) 05/10/2021 1001   LEUKOCYTESUR SMALL (A) 03/10/2021 1134    Lab Results  Component Value Date   LABMICR See below: 05/10/2021   WBCUA 11-30 (A) 05/10/2021   LABEPIT 0-10 05/10/2021   MUCUS Present 05/10/2021   BACTERIA Many (A) 05/10/2021    Pertinent Imaging: No results found for this or any previous visit.  No results found for this or any previous visit.  No results found for this or any previous visit.  No results found for this or any previous visit.  No results found for this or any previous visit.  No results found for this or any previous visit.  No results found for this or any previous visit.  No results found for this or any previous visit.  No results found for this or any previous visit (from the past 24 hour(s)).

## 2021-08-16 NOTE — Progress Notes (Unsigned)
Cath Change/ Replacement  Patient is present today for a catheter change due to urinary retention.  61m of water was removed from the balloon, a 18FR foley cath was removed with out difficulty.  Patient was cleaned and prepped in a sterile fashion with betadine. A 18 FR foley cath was replaced into the bladder no complications were noted Urine return was noted 253mand urine was yellow in color. The balloon was filled with 1078mf sterile water. A leg bag was attached for drainage.  A night bag was also given to the patient and patient was given instruction on how to change from one bag to another. Patient was given proper instruction on catheter care.    Performed by: KouLevi AlandMA  Follow up: Follow up in 1 month for cath change.

## 2021-08-16 NOTE — Patient Instructions (Signed)
Apply clotrimazole cream twice daily. Keep affected area as dry as possible. Pull back skin 2-3 times daily to allow tissue to dry.

## 2021-08-22 DIAGNOSIS — R059 Cough, unspecified: Secondary | ICD-10-CM | POA: Diagnosis not present

## 2021-08-22 DIAGNOSIS — J0140 Acute pansinusitis, unspecified: Secondary | ICD-10-CM | POA: Diagnosis not present

## 2021-08-24 ENCOUNTER — Telehealth: Payer: Medicare PPO | Admitting: Nurse Practitioner

## 2021-08-24 ENCOUNTER — Emergency Department (HOSPITAL_COMMUNITY): Payer: Medicare PPO

## 2021-08-24 ENCOUNTER — Encounter (HOSPITAL_COMMUNITY): Payer: Self-pay

## 2021-08-24 ENCOUNTER — Emergency Department (HOSPITAL_COMMUNITY)
Admission: EM | Admit: 2021-08-24 | Discharge: 2021-08-24 | Disposition: A | Discharge status: Home / Self Care | Payer: Medicare PPO | Attending: Emergency Medicine | Admitting: Emergency Medicine

## 2021-08-24 ENCOUNTER — Other Ambulatory Visit: Payer: Self-pay

## 2021-08-24 DIAGNOSIS — J40 Bronchitis, not specified as acute or chronic: Secondary | ICD-10-CM | POA: Diagnosis not present

## 2021-08-24 DIAGNOSIS — J189 Pneumonia, unspecified organism: Secondary | ICD-10-CM | POA: Diagnosis not present

## 2021-08-24 DIAGNOSIS — Z87891 Personal history of nicotine dependence: Secondary | ICD-10-CM | POA: Diagnosis not present

## 2021-08-24 DIAGNOSIS — J449 Chronic obstructive pulmonary disease, unspecified: Secondary | ICD-10-CM | POA: Diagnosis not present

## 2021-08-24 DIAGNOSIS — Z7901 Long term (current) use of anticoagulants: Secondary | ICD-10-CM | POA: Insufficient documentation

## 2021-08-24 DIAGNOSIS — Z79899 Other long term (current) drug therapy: Secondary | ICD-10-CM | POA: Diagnosis not present

## 2021-08-24 DIAGNOSIS — R6889 Other general symptoms and signs: Secondary | ICD-10-CM

## 2021-08-24 DIAGNOSIS — J069 Acute upper respiratory infection, unspecified: Secondary | ICD-10-CM | POA: Diagnosis not present

## 2021-08-24 DIAGNOSIS — R059 Cough, unspecified: Secondary | ICD-10-CM | POA: Diagnosis not present

## 2021-08-24 DIAGNOSIS — Z91040 Latex allergy status: Secondary | ICD-10-CM | POA: Insufficient documentation

## 2021-08-24 DIAGNOSIS — B9789 Other viral agents as the cause of diseases classified elsewhere: Secondary | ICD-10-CM | POA: Diagnosis not present

## 2021-08-24 DIAGNOSIS — R918 Other nonspecific abnormal finding of lung field: Secondary | ICD-10-CM | POA: Diagnosis not present

## 2021-08-24 DIAGNOSIS — I4891 Unspecified atrial fibrillation: Secondary | ICD-10-CM | POA: Insufficient documentation

## 2021-08-24 DIAGNOSIS — I10 Essential (primary) hypertension: Secondary | ICD-10-CM | POA: Insufficient documentation

## 2021-08-24 DIAGNOSIS — Z20822 Contact with and (suspected) exposure to covid-19: Secondary | ICD-10-CM | POA: Diagnosis not present

## 2021-08-24 DIAGNOSIS — R0981 Nasal congestion: Secondary | ICD-10-CM | POA: Diagnosis present

## 2021-08-24 LAB — URINALYSIS, ROUTINE W REFLEX MICROSCOPIC
Bilirubin Urine: NEGATIVE
Glucose, UA: NEGATIVE mg/dL
Ketones, ur: NEGATIVE mg/dL
Nitrite: NEGATIVE
Protein, ur: NEGATIVE mg/dL
Specific Gravity, Urine: 1.003 — ABNORMAL LOW (ref 1.005–1.030)
pH: 7 (ref 5.0–8.0)

## 2021-08-24 LAB — CBC WITH DIFFERENTIAL/PLATELET
Abs Immature Granulocytes: 0.01 10*3/uL (ref 0.00–0.07)
Basophils Absolute: 0 10*3/uL (ref 0.0–0.1)
Basophils Relative: 0 %
Eosinophils Absolute: 0.1 10*3/uL (ref 0.0–0.5)
Eosinophils Relative: 3 %
HCT: 38.4 % — ABNORMAL LOW (ref 39.0–52.0)
Hemoglobin: 12.2 g/dL — ABNORMAL LOW (ref 13.0–17.0)
Immature Granulocytes: 0 %
Lymphocytes Relative: 23 %
Lymphs Abs: 1 10*3/uL (ref 0.7–4.0)
MCH: 29.5 pg (ref 26.0–34.0)
MCHC: 31.8 g/dL (ref 30.0–36.0)
MCV: 93 fL (ref 80.0–100.0)
Monocytes Absolute: 0.6 10*3/uL (ref 0.1–1.0)
Monocytes Relative: 12 %
Neutro Abs: 2.8 10*3/uL (ref 1.7–7.7)
Neutrophils Relative %: 62 %
Platelets: 188 10*3/uL (ref 150–400)
RBC: 4.13 MIL/uL — ABNORMAL LOW (ref 4.22–5.81)
RDW: 14.1 % (ref 11.5–15.5)
WBC: 4.6 10*3/uL (ref 4.0–10.5)
nRBC: 0 % (ref 0.0–0.2)

## 2021-08-24 LAB — COMPREHENSIVE METABOLIC PANEL
ALT: 11 U/L (ref 0–44)
AST: 17 U/L (ref 15–41)
Albumin: 3.7 g/dL (ref 3.5–5.0)
Alkaline Phosphatase: 59 U/L (ref 38–126)
Anion gap: 8 (ref 5–15)
BUN: 12 mg/dL (ref 8–23)
CO2: 28 mmol/L (ref 22–32)
Calcium: 8.7 mg/dL — ABNORMAL LOW (ref 8.9–10.3)
Chloride: 99 mmol/L (ref 98–111)
Creatinine, Ser: 0.73 mg/dL (ref 0.61–1.24)
GFR, Estimated: >60 mL/min (ref >60)
Glucose, Bld: 106 mg/dL — ABNORMAL HIGH (ref 70–99)
Potassium: 3.7 mmol/L (ref 3.5–5.1)
Sodium: 135 mmol/L (ref 135–145)
Total Bilirubin: 1.1 mg/dL (ref 0.3–1.2)
Total Protein: 7.5 g/dL (ref 6.5–8.1)

## 2021-08-24 LAB — RESP PANEL BY RT-PCR (FLU A&B, COVID) ARPGX2
Influenza A by PCR: NEGATIVE
Influenza B by PCR: NEGATIVE
SARS Coronavirus 2 by RT PCR: NEGATIVE

## 2021-08-24 MED ORDER — SODIUM CHLORIDE 0.9 % IV SOLN: INTRAVENOUS | Status: DC

## 2021-08-24 MED ORDER — SODIUM CHLORIDE 0.9 % IV BOLUS
1000.0000 mL | Freq: Once | INTRAVENOUS | Status: AC
  Administered 2021-08-24: 1000 mL via INTRAVENOUS

## 2021-08-24 NOTE — ED Triage Notes (Signed)
Pt stopped up. Cough. XR last week was all clear. Pt lethargic. On Amoxicillin for sinus infection. Pt nausea. Pt not eating much. Tested neg for covid.

## 2021-08-24 NOTE — Discharge Instructions (Signed)
Work-up without any acute findings.  Chest x-ray CT chest without evidence of pneumonia.  COVID influenza testing negative.  Patient's urinalysis with questionable urinary tract infection has had a long indwelling Foley catheter.  Urine culture sent.  You will be notified if the urine culture is consistent with urinary tract infection.  Continue the amoxicillin until you know about the urine.  Then can stop.  Because most likely this is a viral upper respiratory infection.  Would recommend Mucinex DM and also if the diarrhea gets worse Imodium A-D over-the-counter.  Return for any new or worse symptoms.

## 2021-08-24 NOTE — ED Provider Notes (Signed)
[facility] EMERGENCY DEPARTMENT Provider Note   CSN: [id number] Arrival date & time: 08/24/21  1427     History  Chief Complaint  Patient presents with   Nasal Congestion   Cough    Tristan Lopez is a 88 y.o. male.  Patient companied by his daughter.  Since last Wednesday or Thursday patient has had a cough with a lot of congestion and a productive cough.  Patient his energy levels have been low his appetite has been down.  He was started on amoxicillin just on Tuesday for questionable sinus infection.  Patient's had nausea no real vomiting maybe 1 episode of vomiting.  Also tested negative for COVID.  No fevers.  Little bit of loose bowel movements past few days no blood.  Oxygen saturations here on room air all in the upper 90s.  Respiratory rate not up.  Blood pressure slightly hypertensive.  Past medical history significant for chronic atrial fibrillation, hypertension COPD high cholesterol.  Past surgical history significant for colonoscopies in and hernia repair bilateral inguinal hernia repairs.  Patient is a former smoker quit in 1992.       Home Medications Prior to Admission medications   Medication Sig Start Date End Date Taking? Authorizing Provider  amLODipine (NORVASC) 5 MG tablet Take 1 tablet (5 mg total) by mouth daily. 01/20/21   Noreene Larsson, NP  Ascorbic Acid (VITAMIN C) 1000 MG tablet Take 1,000 mg by mouth daily.    [provider]  bacitracin 500 UNIT/GM ointment Apply 1 application. topically 2 (two) times daily. 05/10/21   McKenzie, Candee Furbish, MD  benazepril (LOTENSIN) 40 MG tablet Take 1 tablet (40 mg total) by mouth daily. 03/14/21   Ahmed Prima, Fransisco Hertz, PA-C  cefUROXime (CEFTIN) 250 MG tablet Take 1 tablet (250 mg total) by mouth 2 (two) times daily with a meal. 06/29/21   McKenzie, Candee Furbish, MD  Cholecalciferol (VITAMIN D-3) 1000 units CAPS Take 2,000 Units by mouth daily.    [provider]  clotrimazole (LOTRIMIN) 1 % cream Apply 1  Application topically 2 (two) times daily. 08/16/21   Summerlin, Berneice Heinrich, PA-C  furosemide (LASIX) 20 MG tablet Take 1 tablet (20 mg total) by mouth daily. Take 20 mg daily as needed for leg swelling 05/09/21   Renee Rival, FNP  Garlic 5631 MG CAPS Take 1,000 mg by mouth daily.    [provider]  pantoprazole (PROTONIX) 40 MG tablet Take 1 tablet (40 mg total) by mouth daily. 05/23/21   Paseda, Dewaine Conger, FNP  potassium chloride (KLOR-CON) 10 MEQ tablet TAKE 1 TABLET (10 MEQ TOTAL) BY MOUTH DAILY. 07/03/21   Renee Rival, FNP  Probiotic Product (DIGESTIVE ADVANTAGE PO) Take by mouth 2 (two) times daily.     [provider]  propranolol (INDERAL) 20 MG tablet Take 1 tablet (20 mg total) by mouth daily. 03/14/21   Strader, Fransisco Hertz, PA-C  rosuvastatin (CRESTOR) 10 MG tablet Take 1 tablet (10 mg total) by mouth daily. 03/14/21   Strader, Fransisco Hertz, PA-C  tadalafil (CIALIS) 5 MG tablet Take 1 tablet (5 mg total) by mouth daily. 07/06/21   Paseda, Dewaine Conger, FNP  tiZANidine (ZANAFLEX) 2 MG tablet Take by mouth every 6 (six) hours as needed for muscle spasms.    [provider]  warfarin (COUMADIN) 5 MG tablet TAKE 1 TABLET (5 MG TOTAL) BY MOUTH DAILY. 07/27/21   Renee Rival, FNP      Allergies  Latex    Review of Systems   Review of Systems  Constitutional:  Positive for activity change and appetite change. Negative for chills and fever.  HENT:  Positive for congestion. Negative for ear pain and sore throat.   Eyes:  Negative for pain and visual disturbance.  Respiratory:  Positive for cough. Negative for shortness of breath.   Cardiovascular:  Negative for chest pain and palpitations.  Gastrointestinal:  Negative for abdominal pain and vomiting.  Genitourinary:  Negative for dysuria and hematuria.  Musculoskeletal:  Negative for arthralgias and back pain.  Skin:  Negative for color change and rash.  Neurological:  Negative for seizures  and syncope.  All other systems reviewed and are negative.   Physical Exam Updated Vital Signs BP (!) 161/70   Pulse 69   Temp 97.7 F (36.5 C) (Oral)   Resp 16   Ht 1.753 m ('5\' 9"'$ )   Wt 98.4 kg   SpO2 100%   BMI 32.05 kg/m  Physical Exam Vitals and nursing note reviewed.  Constitutional:      General: He is not in acute distress.    Appearance: Normal appearance. He is well-developed.  HENT:     Head: Normocephalic and atraumatic.     Mouth/Throat:     Comments: Slightly dry Eyes:     Extraocular Movements: Extraocular movements intact.     Conjunctiva/sclera: Conjunctivae normal.     Pupils: Pupils are equal, round, and reactive to light.  Cardiovascular:     Rate and Rhythm: Normal rate and regular rhythm.     Heart sounds: No murmur heard. Pulmonary:     Effort: Pulmonary effort is normal. No respiratory distress.     Breath sounds: Normal breath sounds. No stridor. No wheezing, rhonchi or rales.  Abdominal:     Palpations: Abdomen is soft.     Tenderness: There is no abdominal tenderness.  Musculoskeletal:        General: No swelling.     Cervical back: Normal range of motion and neck supple. No rigidity.  Skin:    General: Skin is warm and dry.     Capillary Refill: Capillary refill takes less than 2 seconds.  Neurological:     General: No focal deficit present.     Mental Status: He is alert and oriented to person, place, and time.     Cranial Nerves: No cranial nerve deficit.     Sensory: No sensory deficit.     Motor: No weakness.  Psychiatric:        Mood and Affect: Mood normal.     ED Results / Procedures / Treatments   Labs (all labs ordered are listed, but only abnormal results are displayed) Labs Reviewed  COMPREHENSIVE METABOLIC PANEL - Abnormal; Notable for the following components:      Result Value   Glucose, Bld 106 (*)    Calcium 8.7 (*)    All other components within normal limits  CBC WITH DIFFERENTIAL/PLATELET - Abnormal; Notable  for the following components:   RBC 4.13 (*)    Hemoglobin 12.2 (*)    HCT 38.4 (*)    All other components within normal limits  URINALYSIS, ROUTINE W REFLEX MICROSCOPIC - Abnormal; Notable for the following components:   Color, Urine STRAW (*)    Specific Gravity, Urine 1.003 (*)    Hgb urine dipstick SMALL (*)    Leukocytes,Ua LARGE (*)    Bacteria, UA RARE (*)    All other components within  normal limits  RESP PANEL BY RT-PCR (FLU A&B, COVID) [id number]  URINE CULTURE    EKG None  Radiology CT Chest Wo Contrast  Result Date: 08/24/2021 CLINICAL DATA:  Pneumonia EXAM: CT CHEST WITHOUT CONTRAST TECHNIQUE: Multidetector CT imaging of the chest was performed following the standard protocol without IV contrast. RADIATION DOSE REDUCTION: This exam was performed according to the departmental dose-optimization program which includes automated exposure control, adjustment of the mA and/or kV according to patient size and/or use of iterative reconstruction technique. COMPARISON:  CT chest 11/27/2020. CTA abdomen and pelvis 06/06/2020. FINDINGS: Cardiovascular: Heart is enlarged, unchanged. Aorta is normal in size. There are atherosclerotic calcifications of the aorta and coronary arteries. No pericardial effusion. Mediastinum/Nodes: No enlarged mediastinal or axillary lymph nodes. Thyroid gland, trachea, and esophagus demonstrate no significant findings. Lungs/Pleura: There is a 2.0 x 1.4 cm low-attenuation nodular density in the region of the lingula abutting the pericardium. This has decreased in size compared to prior examinations. The lungs are otherwise clear. There is no pleural effusion or pneumothorax. There is no focal lung consolidation. Trachea and central airways are patent. Upper Abdomen: Partially calcified masses in the liver appear unchanged measuring up to 7 cm. Renal cysts are again seen. 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 No acute fracture or focal osseous lesion.  IMPRESSION: 1. Indeterminate 1.6 Cm low-attenuation nodular density in the region of the lingula abutting the pericardium has decreased in size. Findings are favored as benign such as pericardial cyst. No new pulmonary nodules. No new focal lung infiltrate. 2. Stable cardiomegaly. 3.  Aortic Atherosclerosis (ICD10-I70.0). Electronically Signed   By: Ronney Asters M.D.   On: 08/24/2021 18:46   DG Chest Port 1 View  Result Date: 08/24/2021 CLINICAL DATA:  Cough and congestion EXAM: PORTABLE CHEST 1 VIEW COMPARISON:  Radiographs and CT 11/27/2020 FINDINGS: Increased size of cardiomediastinal silhouette likely due to differences in technique compared with 11/27/2020. Nodular opacity projecting over the left heart border is grossly similar to priors. Retrocardiac atelectasis/consolidation. Definite pleural effusion. No pneumothorax. Presumed nipple shadow projecting over the right costophrenic angle. Aortic calcification. IMPRESSION: Retrocardiac atelectasis/consolidation. The lungs are otherwise clear. Electronically Signed   By: Placido Sou M.D.   On: 08/24/2021 17:40    Procedures Procedures    Medications Ordered in ED Medications  0.9 %  sodium chloride infusion ( Intravenous New Bag/Given 08/24/21 1748)  sodium chloride 0.9 % bolus 1,000 mL (0 mLs Intravenous Stopped 08/24/21 1920)    ED Course/ Medical Decision Making/ A&P                           Medical Decision Making Amount and/or Complexity of Data Reviewed Labs: ordered. Radiology: ordered.  Risk Prescription drug management.   Patient treated with some IV fluids here.  Urinalysis does have 21-50 whites.  Family was not very interested in starting a new antibiotic so we will wait for urine culture.  Certainly does not explain his respiratory symptoms.  COVID influenza testing negative chest x-ray without any acute findings but since his story sounded a lot like pneumonia get CT chest but no evidence of focal lung infiltrate.  No  new pulmonary nodules.  There is an old one that is actually somewhat smaller in size so that is reassuring.  Symptoms probably consistent with a bronchitis upper respiratory flulike illness.  Will treat symptomatically.  They will continue the amoxicillin till we know whether the urine is  negative.  Then he can stop it.  Recommending treating with Mucinex DM and Imodium A-D if the diarrhea gets worse.  Patient clinically stable for discharge home.  Final Clinical Impression(s) / ED Diagnoses Final diagnoses:  Viral upper respiratory tract infection  Flu-like symptoms  Bronchitis    Rx / DC Orders ED Discharge Orders     None         Fredia Sorrow, MD 08/24/21 1936

## 2021-08-26 LAB — URINE CULTURE: Culture: NO GROWTH

## 2021-08-30 ENCOUNTER — Emergency Department (HOSPITAL_COMMUNITY): Payer: Medicare PPO

## 2021-08-30 ENCOUNTER — Encounter (HOSPITAL_COMMUNITY): Payer: Self-pay

## 2021-08-30 ENCOUNTER — Other Ambulatory Visit: Payer: Self-pay

## 2021-08-30 ENCOUNTER — Emergency Department (HOSPITAL_COMMUNITY)
Admission: EM | Admit: 2021-08-30 | Discharge: 2021-08-30 | Disposition: A | Discharge status: Home / Self Care | Payer: Medicare PPO | Attending: Emergency Medicine | Admitting: Emergency Medicine

## 2021-08-30 DIAGNOSIS — Z91040 Latex allergy status: Secondary | ICD-10-CM | POA: Diagnosis not present

## 2021-08-30 DIAGNOSIS — I4891 Unspecified atrial fibrillation: Secondary | ICD-10-CM | POA: Diagnosis not present

## 2021-08-30 DIAGNOSIS — K644 Residual hemorrhoidal skin tags: Secondary | ICD-10-CM | POA: Diagnosis not present

## 2021-08-30 DIAGNOSIS — Z79899 Other long term (current) drug therapy: Secondary | ICD-10-CM | POA: Diagnosis not present

## 2021-08-30 DIAGNOSIS — R5381 Other malaise: Secondary | ICD-10-CM | POA: Diagnosis not present

## 2021-08-30 DIAGNOSIS — R531 Weakness: Secondary | ICD-10-CM | POA: Diagnosis not present

## 2021-08-30 DIAGNOSIS — N189 Chronic kidney disease, unspecified: Secondary | ICD-10-CM | POA: Insufficient documentation

## 2021-08-30 DIAGNOSIS — J449 Chronic obstructive pulmonary disease, unspecified: Secondary | ICD-10-CM | POA: Insufficient documentation

## 2021-08-30 LAB — PROTIME-INR
INR: 2.7 — ABNORMAL HIGH (ref 0.8–1.2)
Prothrombin Time: 28.4 seconds — ABNORMAL HIGH (ref 11.4–15.2)

## 2021-08-30 LAB — URINALYSIS, ROUTINE W REFLEX MICROSCOPIC
Bilirubin Urine: NEGATIVE
Glucose, UA: NEGATIVE mg/dL
Ketones, ur: NEGATIVE mg/dL
Nitrite: NEGATIVE
Protein, ur: 30 mg/dL — AB
Specific Gravity, Urine: 1.01 (ref 1.005–1.030)
WBC, UA: >50 WBC/hpf — ABNORMAL HIGH (ref 0–5)
pH: 7 (ref 5.0–8.0)

## 2021-08-30 LAB — COMPREHENSIVE METABOLIC PANEL
ALT: 11 U/L (ref 0–44)
AST: 16 U/L (ref 15–41)
Albumin: 3.4 g/dL — ABNORMAL LOW (ref 3.5–5.0)
Alkaline Phosphatase: 49 U/L (ref 38–126)
Anion gap: 7 (ref 5–15)
BUN: 17 mg/dL (ref 8–23)
CO2: 29 mmol/L (ref 22–32)
Calcium: 8.9 mg/dL (ref 8.9–10.3)
Chloride: 101 mmol/L (ref 98–111)
Creatinine, Ser: 0.96 mg/dL (ref 0.61–1.24)
GFR, Estimated: >60 mL/min (ref >60)
Glucose, Bld: 100 mg/dL — ABNORMAL HIGH (ref 70–99)
Potassium: 4 mmol/L (ref 3.5–5.1)
Sodium: 137 mmol/L (ref 135–145)
Total Bilirubin: 0.7 mg/dL (ref 0.3–1.2)
Total Protein: 6.7 g/dL (ref 6.5–8.1)

## 2021-08-30 LAB — CBC WITH DIFFERENTIAL/PLATELET
Abs Immature Granulocytes: 0.01 10*3/uL (ref 0.00–0.07)
Basophils Absolute: 0 10*3/uL (ref 0.0–0.1)
Basophils Relative: 0 %
Eosinophils Absolute: 0.1 10*3/uL (ref 0.0–0.5)
Eosinophils Relative: 2 %
HCT: 37.1 % — ABNORMAL LOW (ref 39.0–52.0)
Hemoglobin: 11.7 g/dL — ABNORMAL LOW (ref 13.0–17.0)
Immature Granulocytes: 0 %
Lymphocytes Relative: 33 %
Lymphs Abs: 1.7 10*3/uL (ref 0.7–4.0)
MCH: 29.2 pg (ref 26.0–34.0)
MCHC: 31.5 g/dL (ref 30.0–36.0)
MCV: 92.5 fL (ref 80.0–100.0)
Monocytes Absolute: 0.5 10*3/uL (ref 0.1–1.0)
Monocytes Relative: 10 %
Neutro Abs: 2.8 10*3/uL (ref 1.7–7.7)
Neutrophils Relative %: 55 %
Platelets: 221 10*3/uL (ref 150–400)
RBC: 4.01 MIL/uL — ABNORMAL LOW (ref 4.22–5.81)
RDW: 13.9 % (ref 11.5–15.5)
WBC: 5.1 10*3/uL (ref 4.0–10.5)
nRBC: 0 % (ref 0.0–0.2)

## 2021-08-30 LAB — TYPE AND SCREEN
ABO/RH(D): AB POS
Antibody Screen: NEGATIVE

## 2021-08-30 LAB — POC OCCULT BLOOD, ED: Fecal Occult Bld: POSITIVE — AB

## 2021-08-30 MED ORDER — HYDROCORTISONE ACETATE 25 MG RE SUPP: 25.0000 mg | Freq: Two times a day (BID) | RECTAL | 0 refills | Status: DC

## 2021-08-30 NOTE — Discharge Instructions (Addendum)
You have been prescribed a medication to help heal your hemorrhoid which I suspect is the source of the blood you saw with your bowel movement today.  Also, sitting in a warm tub of water twice a day for 10 to 15 minutes can also help heal and reduce a small hemorrhoid.

## 2021-08-30 NOTE — ED Triage Notes (Signed)
BIB EMS from home with complaints of weakness, lethargy for the past three days. Recently treated for URI. States that he noticed bright red stool in BM this am.

## 2021-08-30 NOTE — ED Provider Notes (Signed)
[facility] EMERGENCY DEPARTMENT Provider Note   CSN: [id number] Arrival date & time: 08/30/21  1809     History  Chief Complaint  Patient presents with   Weakness    Tristan Lopez is a 88 y.o. male with a history of atrial fibrillation on Coumadin therapy, hyperlipidemia, GERD, COPD, chronic kidney disease, history of diverticulosis, hemorrhoids with rectal bleeding who was seen here on August 3 and treated for a viral upper respiratory infection.  During that visit he was also found to have a urinary tract infection and completed a 5-day course of antibiotics yesterday.  He presents today secondary to generalized Weakness over the past several days.  He also had constipation, probably induced by the Imodium he took when he had diarrhea last week.  He had a small bowel movement yesterday evening after taking a Senokot tablet and then he had a larger bowel movement today which was accompanied by bright red blood with the movement.  He describes swipes of blood on the toilet tissue with multiple wipes, no significant blood was in the toilet bowl however.  He denies abdominal pain, nausea or vomiting, but did have brief rectal pain with the bowel movement.  Daughter at the bedside gives majority of patient history.  10:15 PM Patient's son is now at the bedside and also expresses concern about patient having escalating anxiety in the evening hours when he is home alone.  He has a home care arrangement where he has providers during the day until 7 PM, then he usually goes to bed around 8 PM.  The son has noted that after the caregiver leaves, he is frequently calling family members and appears to be very anxious.  He is asking about an anxiolytic to help him with this symptom.  He was in assisted living but returned to his home when his wife had to go to more advanced nursing care due to dementia.  The history is provided by the patient and a relative (daughter at bedside).       Home  Medications Prior to Admission medications   Medication Sig Start Date End Date Taking? Authorizing Provider  hydrocortisone (ANUSOL-HC) 25 MG suppository Place 1 suppository (25 mg total) rectally 2 (two) times daily. 08/30/21  Yes Elayna Tobler, Almyra Free, PA-C  amLODipine (NORVASC) 5 MG tablet Take 1 tablet (5 mg total) by mouth daily. 01/20/21   Noreene Larsson, NP  Ascorbic Acid (VITAMIN C) 1000 MG tablet Take 1,000 mg by mouth daily.    [provider]  bacitracin 500 UNIT/GM ointment Apply 1 application. topically 2 (two) times daily. 05/10/21   McKenzie, Candee Furbish, MD  benazepril (LOTENSIN) 40 MG tablet Take 1 tablet (40 mg total) by mouth daily. 03/14/21   Ahmed Prima, Fransisco Hertz, PA-C  cefUROXime (CEFTIN) 250 MG tablet Take 1 tablet (250 mg total) by mouth 2 (two) times daily with a meal. 06/29/21   McKenzie, Candee Furbish, MD  Cholecalciferol (VITAMIN D-3) 1000 units CAPS Take 2,000 Units by mouth daily.    [provider]  clotrimazole (LOTRIMIN) 1 % cream Apply 1 Application topically 2 (two) times daily. 08/16/21   Summerlin, Berneice Heinrich, PA-C  furosemide (LASIX) 20 MG tablet Take 1 tablet (20 mg total) by mouth daily. Take 20 mg daily as needed for leg swelling 05/09/21   Renee Rival, FNP  Garlic 1749 MG CAPS Take 1,000 mg by mouth daily.    [provider]  pantoprazole (PROTONIX) 40 MG tablet Take 1 tablet (  40 mg total) by mouth daily. 05/23/21   Paseda, Dewaine Conger, FNP  potassium chloride (KLOR-CON) 10 MEQ tablet TAKE 1 TABLET (10 MEQ TOTAL) BY MOUTH DAILY. 07/03/21   Renee Rival, FNP  Probiotic Product (DIGESTIVE ADVANTAGE PO) Take by mouth 2 (two) times daily.     [provider]  propranolol (INDERAL) 20 MG tablet Take 1 tablet (20 mg total) by mouth daily. 03/14/21   Strader, Fransisco Hertz, PA-C  rosuvastatin (CRESTOR) 10 MG tablet Take 1 tablet (10 mg total) by mouth daily. 03/14/21   Strader, Fransisco Hertz, PA-C  tadalafil (CIALIS) 5 MG tablet Take 1 tablet  (5 mg total) by mouth daily. 07/06/21   Paseda, Dewaine Conger, FNP  tiZANidine (ZANAFLEX) 2 MG tablet Take by mouth every 6 (six) hours as needed for muscle spasms.    [provider]  warfarin (COUMADIN) 5 MG tablet TAKE 1 TABLET (5 MG TOTAL) BY MOUTH DAILY. 07/27/21   Renee Rival, FNP      Allergies    Latex    Review of Systems   Review of Systems  Constitutional:  Negative for fever.  HENT:  Negative for congestion and sore throat.   Eyes: Negative.   Respiratory:  Negative for chest tightness and shortness of breath.   Cardiovascular:  Negative for chest pain.  Gastrointestinal:  Positive for anal bleeding. Negative for abdominal pain and nausea.  Genitourinary: Negative.   Musculoskeletal:  Negative for arthralgias, joint swelling and neck pain.  Skin: Negative.  Negative for rash and wound.  Neurological:  Negative for dizziness, weakness, light-headedness, numbness and headaches.  Psychiatric/Behavioral: Negative.      Physical Exam Updated Vital Signs BP (!) 156/95   Pulse (!) 58   Temp 97.7 F (36.5 C)   Resp 11   Ht '5\' 9"'$  (1.753 m)   Wt 98.4 kg   SpO2 97%   BMI 32.04 kg/m  Physical Exam Vitals and nursing note reviewed. Exam conducted with a chaperone present.  Constitutional:      Appearance: He is well-developed.  HENT:     Head: Normocephalic and atraumatic.  Eyes:     Conjunctiva/sclera: Conjunctivae normal.  Cardiovascular:     Rate and Rhythm: Normal rate and regular rhythm.     Heart sounds: Normal heart sounds.  Pulmonary:     Effort: Pulmonary effort is normal.     Breath sounds: Normal breath sounds. No wheezing.  Abdominal:     General: Bowel sounds are normal.     Palpations: Abdomen is soft.     Tenderness: There is no abdominal tenderness.  Genitourinary:    Rectum: Guaiac result positive. External hemorrhoid present.     Comments: Light pink visible blood on digital rectal exam.  Small external hemorrhoid, no fissure  appreciated. Musculoskeletal:        General: Normal range of motion.     Cervical back: Normal range of motion.  Skin:    General: Skin is warm and dry.  Neurological:     Mental Status: He is alert.     ED Results / Procedures / Treatments   Labs (all labs ordered are listed, but only abnormal results are displayed) Labs Reviewed  CBC WITH DIFFERENTIAL/PLATELET - Abnormal; Notable for the following components:      Result Value   RBC 4.01 (*)    Hemoglobin 11.7 (*)    HCT 37.1 (*)    All other components within normal limits  COMPREHENSIVE METABOLIC PANEL -  Abnormal; Notable for the following components:   Glucose, Bld 100 (*)    Albumin 3.4 (*)    All other components within normal limits  URINALYSIS, ROUTINE W REFLEX MICROSCOPIC - Abnormal; Notable for the following components:   APPearance HAZY (*)    Hgb urine dipstick MODERATE (*)    Protein, ur 30 (*)    Leukocytes,Ua LARGE (*)    WBC, UA >50 (*)    Bacteria, UA RARE (*)    All other components within normal limits  PROTIME-INR - Abnormal; Notable for the following components:   Prothrombin Time 28.4 (*)    INR 2.7 (*)    All other components within normal limits  POC OCCULT BLOOD, ED - Abnormal; Notable for the following components:   Fecal Occult Bld POSITIVE (*)    All other components within normal limits  TYPE AND SCREEN    EKG None  Radiology DG Chest Portable 1 View  Result Date: 08/30/2021 CLINICAL DATA:  Weakness.  Lethargy. EXAM: PORTABLE CHEST 1 VIEW COMPARISON:  Chest radiograph and CT 6 days ago 08/24/2021. FINDINGS: Stable cardiomegaly. Unchanged mediastinal contours. Aortic atherosclerosis. Left lung pulmonary nodular density on CT is not seen by radiograph. There is no pulmonary edema, pneumothorax or large pleural effusion. Partially calcified liver mass, better seen on CT. IMPRESSION: Stable cardiomegaly. No acute abnormality. Electronically Signed   By: Keith Rake M.D.   On: 08/30/2021  20:26    Procedures Procedures    Medications Ordered in ED Medications - No data to display  ED Course/ Medical Decision Making/ A&P                           Medical Decision Making Patient with bright red blood in stool today in association with a constipated bowel movement, has a small external hemorrhoid present which is probably the source of his bleeding.  He is hemodynamically stable, has no abdominal pain, his hemoglobin is stable at 11.7.  His urine no longer shows obvious urinary infection, leukocytes are still present however.  Advise close follow-up with his primary provider for repeat urinalysis early next week.  May schedule as a lab visit only.  He is prescribed medication for hemorrhoid treatment.  Will defer on any anxiolytics for PCP to determine appropriateness of such a medication.  Stable for discharge home.  Amount and/or Complexity of Data Reviewed Labs: ordered.    Details: Hemoglobin stable.  He is grossly Hemoccult positive, light pink in color, suspected hemorrhoidal bleeding. Radiology: ordered.  Risk Prescription drug management.           Final Clinical Impression(s) / ED Diagnoses Final diagnoses:  External hemorrhoid, bleeding    Rx / DC Orders ED Discharge Orders          Ordered    hydrocortisone (ANUSOL-HC) 25 MG suppository  2 times daily        08/30/21 2213              Evalee Jefferson, PA-C 08/30/21 2215    Daleen Bo, MD 09/01/21 [phone]

## 2021-08-31 ENCOUNTER — Telehealth: Payer: Self-pay | Admitting: Cardiology

## 2021-08-31 NOTE — Telephone Encounter (Signed)
Pt c/o medication issue:  1. Name of Medication:   warfarin (COUMADIN) 5 MG tablet    2. How are you currently taking this medication (dosage and times per day)?   TAKE 1 TABLET (5 MG TOTAL) BY MOUTH DAILY.      3. Are you having a reaction (difficulty breathing--STAT)? No  4. What is your medication issue? Daughter states that pt was at the ED yesterday. She states that they took his INR and se needs to know what needs to be done about adjusting mediation if any. Please advise

## 2021-09-01 ENCOUNTER — Ambulatory Visit (INDEPENDENT_AMBULATORY_CARE_PROVIDER_SITE_OTHER): Payer: Medicare PPO

## 2021-09-01 ENCOUNTER — Telehealth: Payer: Self-pay

## 2021-09-01 DIAGNOSIS — I4891 Unspecified atrial fibrillation: Secondary | ICD-10-CM

## 2021-09-01 DIAGNOSIS — Z5181 Encounter for therapeutic drug level monitoring: Secondary | ICD-10-CM

## 2021-09-01 NOTE — Telephone Encounter (Signed)
Called daughter to provide Warfarin dosing instructions. No answer. Left voicemail.

## 2021-09-01 NOTE — Telephone Encounter (Signed)
Refer to anticoagulation encounter

## 2021-09-01 NOTE — Telephone Encounter (Signed)
Lp's son Tristan Lopez message to call back and discuss INR results.

## 2021-09-04 ENCOUNTER — Other Ambulatory Visit: Payer: Self-pay | Admitting: *Deleted

## 2021-09-04 ENCOUNTER — Telehealth: Payer: Self-pay

## 2021-09-04 DIAGNOSIS — Z5181 Encounter for therapeutic drug level monitoring: Secondary | ICD-10-CM

## 2021-09-04 DIAGNOSIS — I4891 Unspecified atrial fibrillation: Secondary | ICD-10-CM

## 2021-09-04 NOTE — Telephone Encounter (Signed)
Received triage note family requesting urine culture  I called and discussed with daughter, patient recently had ER visit and reports UA had abnormal finding but urine culture was negative.  Reviewed with daughter indwelling catheter patient will have bacteria at times in urine but we would not culture if patient was asymptomatic. Daughter denies any symptoms.    Family will call back if symptoms present.

## 2021-09-18 ENCOUNTER — Ambulatory Visit (INDEPENDENT_AMBULATORY_CARE_PROVIDER_SITE_OTHER): Payer: Medicare PPO | Admitting: Physician Assistant

## 2021-09-18 DIAGNOSIS — R339 Retention of urine, unspecified: Secondary | ICD-10-CM | POA: Diagnosis not present

## 2021-09-18 NOTE — Progress Notes (Unsigned)
Cath Change/ Replacement  Patient is present today for a catheter change due to urinary retention.  19m of water was removed from the balloon, a 18FR foley cath was removed with out difficulty.  Patient was cleaned and prepped in a sterile fashion with betadine. A 18 FR foley cath was replaced into the bladder no complications were noted Urine return was noted 25mand urine was yellow in color. The balloon was filled with 1052mf sterile water. A leg bag was attached for drainage.  A night bag was also given to the patient and patient was given instruction on how to change from one bag to another. Patient was given proper instruction on catheter care.    Performed by: KouLevi AlandMA  Follow up: Follow up as scheduled.

## 2021-09-28 ENCOUNTER — Ambulatory Visit: Payer: Medicare PPO | Admitting: Nurse Practitioner

## 2021-09-28 ENCOUNTER — Encounter: Payer: Self-pay | Admitting: Nurse Practitioner

## 2021-09-28 ENCOUNTER — Other Ambulatory Visit: Payer: Self-pay | Admitting: Nurse Practitioner

## 2021-09-28 VITALS — BP 130/62 | HR 74 | Ht 70.0 in | Wt 220.0 lb

## 2021-09-28 DIAGNOSIS — K625 Hemorrhage of anus and rectum: Secondary | ICD-10-CM

## 2021-09-28 DIAGNOSIS — R2681 Unsteadiness on feet: Secondary | ICD-10-CM | POA: Diagnosis not present

## 2021-09-28 DIAGNOSIS — E785 Hyperlipidemia, unspecified: Secondary | ICD-10-CM

## 2021-09-28 DIAGNOSIS — D649 Anemia, unspecified: Secondary | ICD-10-CM

## 2021-09-28 DIAGNOSIS — Z23 Encounter for immunization: Secondary | ICD-10-CM

## 2021-09-28 DIAGNOSIS — K219 Gastro-esophageal reflux disease without esophagitis: Secondary | ICD-10-CM | POA: Diagnosis not present

## 2021-09-28 DIAGNOSIS — I10 Essential (primary) hypertension: Secondary | ICD-10-CM | POA: Diagnosis not present

## 2021-09-28 NOTE — Assessment & Plan Note (Signed)
Now resolved, no current bleeding  Was recently seen in the emergency room for hemorrhoids a month ago

## 2021-09-28 NOTE — Assessment & Plan Note (Signed)
Lab Results  Component Value Date   CHOL 130 01/06/2021   HDL 49 01/06/2021   LDLCALC 69 01/06/2021   TRIG 56 01/06/2021   CHOLHDL 3.4 07/28/2019  Currently on rosuvastatin 10 mg daily Check lipid panel Avoid fatty fried foods

## 2021-09-28 NOTE — Assessment & Plan Note (Signed)
Doing well on Protonix 40 mg daily Continue current medications

## 2021-09-28 NOTE — Patient Instructions (Signed)
Please get your TDAP vaccine at the pharmacy.   It is important that you exercise regularly at least 30 minutes 5 times a week as tolerated  Think about what you will eat, plan ahead. Choose " clean, green, fresh or frozen" over canned, processed or packaged foods which are more sugary, salty and fatty. 70 to 75% of food eaten should be vegetables and fruit. Three meals at set times with snacks allowed between meals, but they must be fruit or vegetables. Aim to eat over a 12 hour period , example 7 am to 7 pm, and STOP after  your last meal of the day. Drink water,generally about 64 ounces per day, no other drink is as healthy. Fruit juice is best enjoyed in a healthy way, by EATING the fruit.  Thanks for choosing [facility], we consider it a privelige to serve you.

## 2021-09-28 NOTE — Assessment & Plan Note (Signed)
PT ordered for strength training Need to prevent falls discussed,  Patient encouraged to continue to use walker for stability,keep  home free of clutter, maintain adequate lighting in the home

## 2021-09-28 NOTE — Assessment & Plan Note (Signed)
BP Readings from Last 3 Encounters:  09/28/21 130/62  08/30/21 (!) 162/65  08/24/21 (!) 161/70  Chronic medical condition well-controlled on amlodipine 5 mg daily, benazepril 40 mg daily Lasix 20 mg daily as needed, propanolol 20 mg daily Continue current medications DASH diet advised Patient encouraged to exercise daily as tolerated. Follow-up in 6 months

## 2021-09-28 NOTE — Assessment & Plan Note (Signed)
Chronic condition Check iron panel No  current bleeding

## 2021-09-28 NOTE — Assessment & Plan Note (Signed)
Patient educated on CDC recommendation for the vaccine. Verbal consent was obtained from the patient, vaccine administered by nurse, no sign of adverse reactions noted at this time. Patient education on arm soreness and use of tylenol  for this patient  was discussed. Patient educated on the signs and symptoms of adverse effect and advise to contact the office if they occur.  ?

## 2021-09-28 NOTE — Progress Notes (Signed)
   GABRIELLA GUILE     MRN: [id number]      DOB: 03/10/1930   HPI Mr. Valeriano with past medical history of A-fib, essential hypertension, AAA GERD, CKD stage III, BPH is here for follow up and re-evaluation of chronic medical conditions, medication management.  Patient is accompanied to today's visit by his son   Goes to the urologist office to change foley every month. No more rectal bleed  Patient's son stated that the patient  has been using a roalltor for ambulation, standing still bothers him would like some PT for strength training. He denies any pain out of the ordinary . Handicapped packing decal reordered today   The PT denies any adverse reactions to current medications since the last visit.    ROS Denies recent fever or chills. Denies sinus pressure, nasal congestion, ear pain or sore throat. Denies chest congestion, productive cough or wheezing. Denies chest pains, palpitations and leg swelling Denies abdominal pain, nausea, vomiting,diarrhea or constipation.  Denies headaches, seizures, numbness, or tingling. Denies depression, anxiety or insomnia.    PE  BP 130/62 (BP Location: Right Arm, Cuff Size: Large)   Pulse 74   Ht '5\' 10"'$  (1.778 m)   Wt 220 lb (99.8 kg)   SpO2 97%   BMI 31.57 kg/m   Patient alert and oriented and in no cardiopulmonary distress.  Chest: Clear to auscultation bilaterally.  CVS: S1, S2 no murmurs, no S3.Regular rate.  ABD: Soft non tender.   Ext: No edema  MS: decreased ROM spine, shoulders, hips and knees using a walker   Psych: Good eye contact, normal affect. Memory intact not anxious or depressed appearing.   Assessment & Plan  Essential hypertension BP Readings from Last 3 Encounters:  09/28/21 130/62  08/30/21 (!) 162/65  08/24/21 (!) 161/70  Chronic medical condition well-controlled on amlodipine 5 mg daily, benazepril 40 mg daily Lasix 20 mg daily as needed, propanolol 20 mg daily Continue current medications DASH  diet advised Patient encouraged to exercise daily as tolerated. Follow-up in 6 months  GERD (gastroesophageal reflux disease) Doing well on Protonix 40 mg daily Continue current medications   Rectal bleeding Now resolved, no current bleeding  Was recently seen in the emergency room for hemorrhoids a month ago   Hyperlipidemia Lab Results  Component Value Date   CHOL 130 01/06/2021   HDL 49 01/06/2021   LDLCALC 69 01/06/2021   TRIG 56 01/06/2021   CHOLHDL 3.4 07/28/2019  Currently on rosuvastatin 10 mg daily Check lipid panel Avoid fatty fried foods  Anemia Chronic condition Check iron panel No  current bleeding  Unsteady gait when walking PT ordered for strength training Need to prevent falls discussed,  Patient encouraged to continue to use walker for stability,keep  home free of clutter, maintain adequate lighting in the home  Need for immunization against influenza Patient educated on CDC recommendation for the vaccine. Verbal consent was obtained from the patient, vaccine administered by nurse, no sign of adverse reactions noted at this time. Patient education on arm soreness and use of tylenol for this patient  was discussed. Patient educated on the signs and symptoms of adverse effect and advise to contact the office if they occur.

## 2021-10-04 ENCOUNTER — Other Ambulatory Visit: Payer: Self-pay | Admitting: Cardiology

## 2021-10-04 ENCOUNTER — Other Ambulatory Visit: Payer: Self-pay | Admitting: Nurse Practitioner

## 2021-10-04 DIAGNOSIS — Z5181 Encounter for therapeutic drug level monitoring: Secondary | ICD-10-CM | POA: Diagnosis not present

## 2021-10-04 DIAGNOSIS — D649 Anemia, unspecified: Secondary | ICD-10-CM | POA: Diagnosis not present

## 2021-10-04 DIAGNOSIS — E785 Hyperlipidemia, unspecified: Secondary | ICD-10-CM | POA: Diagnosis not present

## 2021-10-05 ENCOUNTER — Other Ambulatory Visit: Payer: Self-pay | Admitting: Nurse Practitioner

## 2021-10-05 ENCOUNTER — Ambulatory Visit (INDEPENDENT_AMBULATORY_CARE_PROVIDER_SITE_OTHER): Payer: Self-pay | Admitting: *Deleted

## 2021-10-05 DIAGNOSIS — I4891 Unspecified atrial fibrillation: Secondary | ICD-10-CM

## 2021-10-05 DIAGNOSIS — Z5181 Encounter for therapeutic drug level monitoring: Secondary | ICD-10-CM

## 2021-10-05 DIAGNOSIS — D508 Other iron deficiency anemias: Secondary | ICD-10-CM

## 2021-10-05 LAB — LIPID PANEL
Chol/HDL Ratio: 2.6 ratio (ref 0.0–5.0)
Cholesterol, Total: 134 mg/dL (ref 100–199)
HDL: 52 mg/dL (ref >39)
LDL Chol Calc (NIH): 69 mg/dL (ref 0–99)
Triglycerides: 63 mg/dL (ref 0–149)
VLDL Cholesterol Cal: 13 mg/dL (ref 5–40)

## 2021-10-05 LAB — IRON,TIBC AND FERRITIN PANEL
Ferritin: 88 ng/mL (ref 30–400)
Iron Saturation: 13 % — ABNORMAL LOW (ref 15–55)
Iron: 43 ug/dL (ref 38–169)
Total Iron Binding Capacity: 322 ug/dL (ref 250–450)
UIBC: 279 ug/dL (ref 111–343)

## 2021-10-05 LAB — PROTIME-INR
INR: 2.1 — ABNORMAL HIGH (ref 0.9–1.2)
Prothrombin Time: 21.4 s — ABNORMAL HIGH (ref 9.1–12.0)

## 2021-10-05 LAB — POCT INR: INR: 2.1 (ref 2.0–3.0)

## 2021-10-05 MED ORDER — IRON (FERROUS SULFATE) 325 (65 FE) MG PO TABS: 325.0000 mg | ORAL_TABLET | Freq: Every day | ORAL | 0 refills | Status: DC

## 2021-10-05 NOTE — Progress Notes (Signed)
Iron level is a little bit low, eat foods rich in iron like beans , peas , spinach, [place] , cereals fortified with iron   Lipid panel is normal.  Continue current medications

## 2021-10-05 NOTE — Progress Notes (Signed)
Start taking ferrous sulphate 325 mg daily , eat plenty of fiber to prevent constipation.

## 2021-10-05 NOTE — Patient Instructions (Signed)
9/14 Spoke with Glendell Docker, pt's son and advised to continue taking 5 mg daily except 2.'5mg'$  on Sundays and Thursdays. Recheck INR 11/13/21 LM on voice mail for Glendell Docker (pt's son) Pt gets labs done at [facility] in [place].  Son Glendell Docker manages pts medicine. Lab orders were mailed to [place] address.

## 2021-10-06 ENCOUNTER — Telehealth: Payer: Self-pay

## 2021-10-06 NOTE — Telephone Encounter (Signed)
Patient returning phone call about lab results.

## 2021-10-06 NOTE — Telephone Encounter (Signed)
Spoke with carl pt's son

## 2021-10-18 ENCOUNTER — Other Ambulatory Visit: Payer: Self-pay

## 2021-10-18 ENCOUNTER — Ambulatory Visit (INDEPENDENT_AMBULATORY_CARE_PROVIDER_SITE_OTHER): Payer: Medicare PPO | Admitting: Physician Assistant

## 2021-10-18 ENCOUNTER — Emergency Department (HOSPITAL_COMMUNITY)
Admission: EM | Admit: 2021-10-18 | Discharge: 2021-10-19 | Disposition: A | Discharge status: Home / Self Care | Payer: Medicare PPO | Attending: Emergency Medicine | Admitting: Emergency Medicine

## 2021-10-18 ENCOUNTER — Encounter (HOSPITAL_COMMUNITY): Payer: Self-pay | Admitting: *Deleted

## 2021-10-18 DIAGNOSIS — R319 Hematuria, unspecified: Secondary | ICD-10-CM | POA: Insufficient documentation

## 2021-10-18 DIAGNOSIS — Z7901 Long term (current) use of anticoagulants: Secondary | ICD-10-CM | POA: Insufficient documentation

## 2021-10-18 DIAGNOSIS — N481 Balanitis: Secondary | ICD-10-CM

## 2021-10-18 DIAGNOSIS — R1030 Lower abdominal pain, unspecified: Secondary | ICD-10-CM | POA: Diagnosis not present

## 2021-10-18 DIAGNOSIS — J449 Chronic obstructive pulmonary disease, unspecified: Secondary | ICD-10-CM | POA: Insufficient documentation

## 2021-10-18 DIAGNOSIS — R339 Retention of urine, unspecified: Secondary | ICD-10-CM | POA: Insufficient documentation

## 2021-10-18 DIAGNOSIS — Z91040 Latex allergy status: Secondary | ICD-10-CM | POA: Insufficient documentation

## 2021-10-18 LAB — CBC
HCT: 39.4 % (ref 39.0–52.0)
Hemoglobin: 12.6 g/dL — ABNORMAL LOW (ref 13.0–17.0)
MCH: 30.1 pg (ref 26.0–34.0)
MCHC: 32 g/dL (ref 30.0–36.0)
MCV: 94 fL (ref 80.0–100.0)
Platelets: 176 10*3/uL (ref 150–400)
RBC: 4.19 MIL/uL — ABNORMAL LOW (ref 4.22–5.81)
RDW: 14.3 % (ref 11.5–15.5)
WBC: 7.5 10*3/uL (ref 4.0–10.5)
nRBC: 0 % (ref 0.0–0.2)

## 2021-10-18 LAB — URINALYSIS, ROUTINE W REFLEX MICROSCOPIC
Bacteria, UA: NONE SEEN
Bilirubin Urine: NEGATIVE
Glucose, UA: NEGATIVE mg/dL
Ketones, ur: NEGATIVE mg/dL
Nitrite: NEGATIVE
Protein, ur: 30 mg/dL — AB
RBC / HPF: >50 RBC/hpf — ABNORMAL HIGH (ref 0–5)
Specific Gravity, Urine: 1.01 (ref 1.005–1.030)
pH: 7 (ref 5.0–8.0)

## 2021-10-18 LAB — BASIC METABOLIC PANEL
Anion gap: 8 (ref 5–15)
BUN: 22 mg/dL (ref 8–23)
CO2: 27 mmol/L (ref 22–32)
Calcium: 9.1 mg/dL (ref 8.9–10.3)
Chloride: 102 mmol/L (ref 98–111)
Creatinine, Ser: 0.95 mg/dL (ref 0.61–1.24)
GFR, Estimated: >60 mL/min (ref >60)
Glucose, Bld: 144 mg/dL — ABNORMAL HIGH (ref 70–99)
Potassium: 3.8 mmol/L (ref 3.5–5.1)
Sodium: 137 mmol/L (ref 135–145)

## 2021-10-18 MED ORDER — CLOTRIMAZOLE 1 % EX CREA: 1.0000 | TOPICAL_CREAM | Freq: Two times a day (BID) | CUTANEOUS | 0 refills | Status: DC

## 2021-10-18 NOTE — ED Provider Notes (Signed)
[facility] EMERGENCY DEPARTMENT Provider Note   CSN: [id number] Arrival date & time: 10/18/21  1918     History  Chief Complaint  Patient presents with   Hematuria    Tristan Lopez is a 88 y.o. male.   Hematuria    Patient has history of chronic A-fib, COPD, abdominal aortic aneurysm, diverticulosis, attempt pretension, hypercholesterolemia, chronic anticoagulation use and has chronic indwelling Foley catheter use.  Patient went to the urologist office to have his catheter changed today.  This was routine monthly change.  Patient states after leaving the office he noticed he was not having any good urine output.  He started developing increasing lower abdominal pain and feels like his bladder is swelling.  Also has the urge to have a bowel movement.  Patient has had trouble with urinary retention in the past and feels he is having the same symptoms as before.  He has noticed some blood-tinged to the urine but no large clots.  Home Medications Prior to Admission medications   Medication Sig Start Date End Date Taking? Authorizing Provider  amLODipine (NORVASC) 5 MG tablet Take 1 tablet (5 mg total) by mouth daily. 01/20/21   Noreene Larsson, NP  Ascorbic Acid (VITAMIN C) 1000 MG tablet Take 1,000 mg by mouth daily.    [provider]  bacitracin 500 UNIT/GM ointment Apply 1 application. topically 2 (two) times daily. 05/10/21   McKenzie, Candee Furbish, MD  benazepril (LOTENSIN) 40 MG tablet Take 1 tablet (40 mg total) by mouth daily. 03/14/21   Strader, Fransisco Hertz, PA-C  Cholecalciferol (VITAMIN D-3) 1000 units CAPS Take 2,000 Units by mouth daily.    [provider]  clotrimazole (LOTRIMIN) 1 % cream Apply 1 Application topically 2 (two) times daily. 10/18/21   Summerlin, Berneice Heinrich, PA-C  furosemide (LASIX) 20 MG tablet Take 1 tablet (20 mg total) by mouth daily. Take 20 mg daily as needed for leg swelling 05/09/21   Renee Rival, FNP  Garlic 1601 MG CAPS  Take 1,000 mg by mouth daily.    [provider]  hydrocortisone (ANUSOL-HC) 25 MG suppository Place 1 suppository (25 mg total) rectally 2 (two) times daily. 08/30/21   Evalee Jefferson, PA-C  Iron, Ferrous Sulfate, 325 (65 Fe) MG TABS Take 325 mg by mouth daily. 10/05/21   Paseda, Dewaine Conger, FNP  pantoprazole (PROTONIX) 40 MG tablet TAKE 1 TABLET (40 MG TOTAL) BY MOUTH DAILY. 09/29/21   Johnette Abraham, MD  potassium chloride (KLOR-CON) 10 MEQ tablet TAKE 1 TABLET (10 MEQ TOTAL) BY MOUTH DAILY. 07/03/21   Renee Rival, FNP  Probiotic Product (DIGESTIVE ADVANTAGE PO) Take by mouth 2 (two) times daily.     [provider]  propranolol (INDERAL) 20 MG tablet Take 1 tablet (20 mg total) by mouth daily. 03/14/21   Strader, Fransisco Hertz, PA-C  rosuvastatin (CRESTOR) 10 MG tablet Take 1 tablet (10 mg total) by mouth daily. 03/14/21   Strader, Fransisco Hertz, PA-C  tadalafil (CIALIS) 5 MG tablet Take 1 tablet (5 mg total) by mouth daily. 07/06/21   Renee Rival, FNP  warfarin (COUMADIN) 5 MG tablet TAKE 1 TABLET (5 MG TOTAL) BY MOUTH DAILY. 07/27/21   Renee Rival, FNP      Allergies    Latex    Review of Systems   Review of Systems  Genitourinary:  Positive for hematuria.    Physical Exam Updated Vital Signs BP (!) 153/80   Pulse 84  Temp 97.7 F (36.5 C) (Oral)   Resp (!) 22   Ht 1.778 m ('5\' 10"'$ )   Wt 99.8 kg   SpO2 98%   BMI 31.57 kg/m  Physical Exam Vitals and nursing note reviewed.  Constitutional:      Appearance: He is well-developed. He is ill-appearing.  HENT:     Head: Normocephalic and atraumatic.     Right Ear: External ear normal.     Left Ear: External ear normal.  Eyes:     General: No scleral icterus.       Right eye: No discharge.        Left eye: No discharge.     Conjunctiva/sclera: Conjunctivae normal.  Neck:     Trachea: No tracheal deviation.  Cardiovascular:     Rate and Rhythm: Normal rate and regular rhythm.  Pulmonary:      Effort: Pulmonary effort is normal. No respiratory distress.     Breath sounds: Normal breath sounds. No stridor.  Abdominal:     General: There is no distension.     Tenderness: There is abdominal tenderness.     Comments: Tenderness in the suprapubic region  Genitourinary:    Comments: Foley catheter with scant amount of clear blood-tinged urine Musculoskeletal:        General: No swelling or deformity.     Cervical back: Neck supple.  Skin:    General: Skin is warm and dry.     Findings: No rash.  Neurological:     Mental Status: He is alert.     Cranial Nerves: Cranial nerve deficit: no gross deficits.     ED Results / Procedures / Treatments   Labs (all labs ordered are listed, but only abnormal results are displayed) Labs Reviewed  CBC - Abnormal; Notable for the following components:      Result Value   RBC 4.19 (*)    Hemoglobin 12.6 (*)    All other components within normal limits  BASIC METABOLIC PANEL - Abnormal; Notable for the following components:   Glucose, Bld 144 (*)    All other components within normal limits  URINALYSIS, ROUTINE W REFLEX MICROSCOPIC - Abnormal; Notable for the following components:   Hgb urine dipstick LARGE (*)    Protein, ur 30 (*)    Leukocytes,Ua SMALL (*)    RBC / HPF >50 (*)    All other components within normal limits    EKG None  Radiology No results found.  Procedures Procedures    Medications Ordered in ED Medications - No data to display  ED Course/ Medical Decision Making/ A&P Clinical Course as of 10/18/21 2350  Wed Oct 18, 2021  2344 Urinalysis, Routine w reflex microscopic Urine, Clean Catch(!) Urinalysis shows hematuria [JK]  2345 CBC(!) Hemoglobin stable [JK]  6270 Basic metabolic panel(!) Normal [JK]  2345 Patient is feeling much better after the Foley catheter placement.  He had large volume diuresis after the catheter was place [JK]    Clinical Course User Index [JK] Dorie Rank, MD                            Medical Decision Making Problems Addressed: Urinary retention: acute illness or injury that poses a threat to life or bodily functions  Amount and/or Complexity of Data Reviewed Labs: ordered. Decision-making details documented in ED Course.   Patient has chronic indwelling Foley catheter use.  He had his catheter replaced today and  fortunately was not draining properly.  Patient came in with complaints of acute pain and decreased urine output.  His Foley catheter was placed and he had large volume diuresis.  All symptoms have resolved.  No signs of infection.  No signs of acute renal dysfunction.  Evaluation and diagnostic testing in the emergency department does not suggest an emergent condition requiring admission or immediate intervention beyond what has been performed at this time.  The patient is safe for discharge and has been instructed to return immediately for worsening symptoms, change in symptoms or any other concerns.        Final Clinical Impression(s) / ED Diagnoses Final diagnoses:  Urinary retention    Rx / DC Orders ED Discharge Orders     None         Dorie Rank, MD 10/18/21 2351

## 2021-10-18 NOTE — ED Triage Notes (Signed)
Pt had foley catheter changed at the urology office earlier today and blood in urine about an hour ago.  Pt is on coumadin for his Afib.  Pt feels like he needs to have a BM.  Pt with hx of same feeling before when foley was not inserted correctly.

## 2021-10-18 NOTE — Progress Notes (Signed)
Simple Catheter Placement  Due to urinary retention patient is present today for a foley cath placement.  Patient was cleaned and prepped in a sterile fashion with betadine A 18 FR foley catheter was inserted, urine return was noted  10 ml, urine was yellow in color.  The balloon was filled with 10cc of sterile water.  A leg bag was attached for drainage. Patient was also given a night bag to take home and was given instruction on how to change from one bag to another.  Patient was given instruction on proper catheter care.  Patient tolerated well, no complications were noted   Performed by: Marisue Brooklyn, CMA  Additional notes/ Follow up: Follow up as scheduled

## 2021-10-18 NOTE — ED Notes (Signed)
Non-skid socks put on pt

## 2021-10-18 NOTE — ED Notes (Signed)
Pt ambulated to the bathroom via walker with standby assist

## 2021-10-18 NOTE — Progress Notes (Signed)
Assessment: 1. Urinary retention - PR CHANGE OF BLADDER TUBE,SIMPLE  2. Balanitis    Plan: Meds ordered this encounter  Medications   clotrimazole (LOTRIMIN) 1 % cream    Sig: Apply 1 Application topically 2 (two) times daily.    Dispense:  45 g    Refill:  0  Keep scheduled visit in 1 month for Foley exchange.  Chief Complaint: No chief complaint on file.   HPI: Tristan Lopez is a 88 y.o. male who presents for monthly catheter change with complaint of new onset distal penis pain..  Portions of the above documentation were copied from a prior visit for review purposes only.  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 Dr. West Carbo   COLONOSCOPY  11/01/11   Dr. Gala Romney- tubular adenoma,suboptimal preparation, internal hemorrhoids, o/w normal rectum. pancolonic diverticulosis   COLONOSCOPY WITH PROPOFOL N/A 11/29/2020   Procedure: COLONOSCOPY WITH PROPOFOL;  Surgeon: Harvel Quale, MD;  Location: [facility];  Service: Gastroenterology;  Laterality: N/A;   ESOPHAGOGASTRODUODENOSCOPY  11/01/11   Dr. Gala Romney- erosive reflux esophagitis along with a patulous EG junction, hialtal hernia, antral  erosions with possible area of healing ulceration- s/p bx= chronic erosive gastritis, no malignancy   ESOPHAGOGASTRODUODENOSCOPY (EGD) WITH PROPOFOL N/A 11/29/2020   Procedure: ESOPHAGOGASTRODUODENOSCOPY (EGD) WITH PROPOFOL;  Surgeon: Harvel Quale, MD;  Location: [facility];  Service: Gastroenterology;  Laterality: N/A;   HERNIA REPAIR     x2, bilateral inguinal    KIDNEY STONE SURGERY     KNEE ARTHROSCOPY     X2   NASAL ENDOSCOPY      SH: Social History   Tobacco Use   Smoking status: Former    Packs/day: 0.50    Types: Cigarettes    Quit date: 1992    Years since quitting: 31.7   Smokeless tobacco: Never  Vaping Use   Vaping Use: Never used  Substance Use Topics   Alcohol use: No   Drug use: No    ROS: All other review of systems were reviewed and are negative except what is noted above in HPI  PE: There were no vitals taken for this visit. GENERAL APPEARANCE:  Well appearing, well developed, well nourished, NAD HEENT:  Atraumatic, normocephalic NECK:  Supple. Trachea midline ABDOMEN:  Soft, non-tender, no masses GU: Glans of the penis is significantly erythematous, moist with mottled appearance consistent with yeast balanitis.  Meatus is beefy red and patient complains of burning with removal and reinsertion of the catheter.   Results: Laboratory Data: Lab  Results  Component Value Date   WBC 5.1 08/30/2021   HGB 11.7 (L) 08/30/2021   HCT 37.1 (L) 08/30/2021   MCV 92.5 08/30/2021   PLT 221 08/30/2021    Lab Results  Component Value Date   CREATININE 0.96 08/30/2021    Lab Results  Component Value Date   HGBA1C 5.2 01/06/2021    Urinalysis    Component Value Date/Time   COLORURINE YELLOW 08/30/2021 2027   APPEARANCEUR HAZY (A) 08/30/2021 2027   APPEARANCEUR Cloudy (A) 05/10/2021 1001   LABSPEC 1.010 08/30/2021 2027   PHURINE 7.0 08/30/2021 2027   GLUCOSEU NEGATIVE 08/30/2021 2027   HGBUR MODERATE (A) 08/30/2021 2027   BILIRUBINUR  NEGATIVE 08/30/2021 2027   BILIRUBINUR Negative 05/10/2021 1001   KETONESUR NEGATIVE 08/30/2021 2027   PROTEINUR 30 (A) 08/30/2021 2027   UROBILINOGEN 0.2 10/04/2011 2218   NITRITE NEGATIVE 08/30/2021 2027   LEUKOCYTESUR LARGE (A) 08/30/2021 2027    Lab Results  Component Value Date   LABMICR See below: 05/10/2021   WBCUA 11-30 (A) 05/10/2021   LABEPIT 0-10 05/10/2021   MUCUS Present 05/10/2021   BACTERIA RARE (A) 08/30/2021    Pertinent Imaging: No results found for this or any previous visit.  No results found for this or any previous visit.  No results found for this or any previous visit.  No results found for this or any previous visit.  No results found for this or any previous visit.  No valid procedures specified. No results found for this or any previous visit.  No results found for this or any previous visit.  No results found for this or any previous visit (from the past 24 hour(s)).

## 2021-10-18 NOTE — ED Notes (Signed)
ED Provider at bedside. 

## 2021-10-18 NOTE — Addendum Note (Signed)
Addended by: Darcella Gasman R on: 10/18/2021 02:11 PM   Modules accepted: Level of Service

## 2021-10-19 ENCOUNTER — Ambulatory Visit: Payer: Medicare PPO | Admitting: Physician Assistant

## 2021-10-19 NOTE — ED Notes (Signed)
Foley bag replaced with leg bag

## 2021-10-20 ENCOUNTER — Other Ambulatory Visit: Payer: Self-pay | Admitting: Internal Medicine

## 2021-10-20 ENCOUNTER — Telehealth: Payer: Self-pay

## 2021-10-20 ENCOUNTER — Telehealth: Payer: Self-pay | Admitting: Internal Medicine

## 2021-10-20 DIAGNOSIS — N390 Urinary tract infection, site not specified: Secondary | ICD-10-CM

## 2021-10-20 MED ORDER — CIPROFLOXACIN HCL 500 MG PO TABS: 500.0000 mg | ORAL_TABLET | Freq: Two times a day (BID) | ORAL | 0 refills | Status: AC

## 2021-10-20 NOTE — Telephone Encounter (Signed)
Patient's daughter call in concern with father having a low grade fever and his cathter not flowing after replacement of his cathter. Daughter voiced that when the ED team replace her father cathter to drain his bladder there about 69 ML and dad was not feeling well. Daughter voiced that her father is not feeling well today after cathter replacement, made patient daughter aware to follow up with PCP or take her father back to the ED due to low grade fever due providing more of acute care also made daughter aware for her father to increase fluid intake.Daughter voiced understanding.

## 2021-10-20 NOTE — Telephone Encounter (Signed)
Spoke with Glendell Docker.

## 2021-10-20 NOTE — Telephone Encounter (Signed)
Called in on patient behalf .  Patient had ER visit yesterday for cath change and discomfort.  Today pt is experiencing low grade fever.  Daughter thinks patient may have an infection. ER did do urine culture but daughter has not heard anything in regard.  Wants a call back in regard to lab results to see if patient can get an antibiotic.  If unable to reach daughter on cell phone please call patient home phone on file.

## 2021-10-30 ENCOUNTER — Ambulatory Visit (INDEPENDENT_AMBULATORY_CARE_PROVIDER_SITE_OTHER): Payer: Medicare PPO | Admitting: Physician Assistant

## 2021-10-30 DIAGNOSIS — R339 Retention of urine, unspecified: Secondary | ICD-10-CM

## 2021-10-30 DIAGNOSIS — T83098A Other mechanical complication of other indwelling urethral catheter, initial encounter: Secondary | ICD-10-CM | POA: Diagnosis not present

## 2021-10-30 NOTE — Progress Notes (Signed)
Patient present in office today complaining of foley catheter not draining properly.  Catheter balloon deflated and tubing pushed further into bladder. 5 ml NS only noted in balloon. Urine return noted immediately.  Balloon inflated with 10 ml of NS Total urine return noted at 400 mls of amber colored urine.  Patient will keep regular scheduled [place] appointment for catheter change.   Procedure visit reviewed. Agree with clinical documentation.  Julienne A Summerlin PA-C

## 2021-10-30 NOTE — Patient Instructions (Signed)

## 2021-11-02 ENCOUNTER — Other Ambulatory Visit: Payer: Self-pay | Admitting: Nurse Practitioner

## 2021-11-06 ENCOUNTER — Other Ambulatory Visit: Payer: Self-pay | Admitting: Nurse Practitioner

## 2021-11-07 ENCOUNTER — Ambulatory Visit (HOSPITAL_COMMUNITY): Payer: Medicare PPO | Admitting: Physical Therapy

## 2021-11-10 ENCOUNTER — Encounter: Payer: Self-pay | Admitting: Internal Medicine

## 2021-11-15 DIAGNOSIS — I4891 Unspecified atrial fibrillation: Secondary | ICD-10-CM | POA: Diagnosis not present

## 2021-11-15 DIAGNOSIS — Z5181 Encounter for therapeutic drug level monitoring: Secondary | ICD-10-CM | POA: Diagnosis not present

## 2021-11-16 ENCOUNTER — Ambulatory Visit (INDEPENDENT_AMBULATORY_CARE_PROVIDER_SITE_OTHER): Payer: Medicare PPO | Admitting: Cardiology

## 2021-11-16 DIAGNOSIS — Z5181 Encounter for therapeutic drug level monitoring: Secondary | ICD-10-CM

## 2021-11-16 DIAGNOSIS — I4891 Unspecified atrial fibrillation: Secondary | ICD-10-CM

## 2021-11-16 LAB — PROTIME-INR
INR: 2.1 — ABNORMAL HIGH (ref 0.9–1.2)
Prothrombin Time: 21.2 s — ABNORMAL HIGH (ref 9.1–12.0)

## 2021-11-16 NOTE — Patient Instructions (Addendum)
Description    Spoke with Glendell Docker, pt's son and advised pt to continue taking warfarin 5 mg daily except 2.'5mg'$  on Sundays and Thursdays. Recheck INR 12/28/2021  Pt gets labs done at [facility] in [place].  Son Glendell Docker manages pts medicine. Lab orders were mailed to [place] address.

## 2021-11-20 ENCOUNTER — Ambulatory Visit (INDEPENDENT_AMBULATORY_CARE_PROVIDER_SITE_OTHER): Payer: Medicare PPO | Admitting: Physician Assistant

## 2021-11-20 ENCOUNTER — Telehealth: Payer: Self-pay

## 2021-11-20 DIAGNOSIS — R339 Retention of urine, unspecified: Secondary | ICD-10-CM | POA: Diagnosis not present

## 2021-11-20 NOTE — Patient Instructions (Signed)

## 2021-11-20 NOTE — Telephone Encounter (Signed)
Son in office today inquiring on a medication that father could take to help shrink prostate and possible try to get catheter out. Please advise.

## 2021-11-20 NOTE — Progress Notes (Signed)
Cath Change/ Replacement  Patient is present today for a catheter change due to urinary retention.  9m of water was removed from the balloon, a 18FR foley cath was removed without difficulty.  Patient was cleaned and prepped in a sterile fashion with betadine and 2% lidocaine jelly was instilled into the urethra. A 18 FR foley cath was replaced into the bladder, no complications were noted. Urine return was noted 228mand urine was yellow in color. The balloon was filled with 1026mf sterile water. A leg bag was attached for drainage.  A night bag was also given to the patient and patient was given instruction on how to change from one bag to another. Patient was given proper instruction on catheter care.    Performed by: Aayliah Rotenberry LPN  Follow up: Keep scheduled NV   Procedure reviewed. Agree with clinical documentation.  Julienne A Summerlin PA-C

## 2021-11-21 MED ORDER — FINASTERIDE 5 MG PO TABS: 5.0000 mg | ORAL_TABLET | Freq: Every day | ORAL | 11 refills | Status: DC

## 2021-11-21 NOTE — Telephone Encounter (Signed)
Son called and made aware of  new medication sent to pharmacy.

## 2021-11-23 ENCOUNTER — Ambulatory Visit (HOSPITAL_COMMUNITY): Payer: Medicare PPO | Admitting: Physical Therapy

## 2021-12-01 ENCOUNTER — Ambulatory Visit: Payer: Medicare PPO

## 2021-12-01 ENCOUNTER — Other Ambulatory Visit: Payer: Self-pay | Admitting: Nurse Practitioner

## 2021-12-01 DIAGNOSIS — D508 Other iron deficiency anemias: Secondary | ICD-10-CM

## 2021-12-04 ENCOUNTER — Ambulatory Visit (HOSPITAL_COMMUNITY): Payer: Medicare PPO | Attending: Nurse Practitioner | Admitting: Physical Therapy

## 2021-12-04 DIAGNOSIS — R2681 Unsteadiness on feet: Secondary | ICD-10-CM | POA: Diagnosis present

## 2021-12-04 DIAGNOSIS — R2689 Other abnormalities of gait and mobility: Secondary | ICD-10-CM | POA: Diagnosis not present

## 2021-12-04 NOTE — Therapy (Signed)
OUTPATIENT PHYSICAL THERAPY NEURO EVALUATION   Patient Name: Tristan Lopez MRN: [id number] DOB:1930/04/21, 88 y.o., male Today's Date: 12/04/2021   PCP: Marland Kitchen MD REFERRING PROVIDER: Renee Rival, FNP   PT End of Session - 12/04/21 1423     Visit Number 1    Number of Visits 16    Date for PT Re-Evaluation 01/29/22    Authorization Type [employer]    Authorization Time Period check auth    Authorization - Visit Number 1    Authorization - Number of Visits 1    Progress Note Due on Visit 10    PT Start Time 6378    PT Stop Time 1425    PT Time Calculation (min) 36 min    Activity Tolerance Patient tolerated treatment well;Patient limited by fatigue    Behavior During Therapy [facility] for tasks assessed/performed             Past Medical History:  Diagnosis Date   Abdominal aortic aneurysm without rupture ([place])    Bilateral renal cysts    BPH (benign prostatic hyperplasia)    Carotid artery occlusion    Chronic atrial fibrillation (HCC)    COPD (chronic obstructive pulmonary disease) ([place])    Diverticulosis    Pancolonic   Essential hypertension    Hemorrhoids    Hiatal hernia    Hx of adenomatous colonic polyps    Hypercholesteremia    Liver cyst    Benign by liver biopsy September 2013   Nephrolithiasis    Nephrolithiasis    Permanent atrial fibrillation ([place])    Pre-diabetes    Reflux esophagitis    Sleep apnea    Uses CPAP   Tubular adenoma    Past Surgical History:  Procedure Laterality Date   COLONOSCOPY     2003, no polyps per patient. Dr. West Carbo   COLONOSCOPY  11/01/11   Dr. Gala Romney- tubular adenoma,suboptimal preparation, internal hemorrhoids, o/w normal rectum. pancolonic diverticulosis   COLONOSCOPY WITH PROPOFOL N/A 11/29/2020   Procedure: COLONOSCOPY WITH PROPOFOL;  Surgeon: Harvel Quale, MD;  Location: [facility];  Service: Gastroenterology;  Laterality: N/A;   ESOPHAGOGASTRODUODENOSCOPY  11/01/11   Dr. Gala Romney-  erosive reflux esophagitis along with a patulous EG junction, hialtal hernia, antral erosions with possible area of healing ulceration- s/p bx= chronic erosive gastritis, no malignancy   ESOPHAGOGASTRODUODENOSCOPY (EGD) WITH PROPOFOL N/A 11/29/2020   Procedure: ESOPHAGOGASTRODUODENOSCOPY (EGD) WITH PROPOFOL;  Surgeon: Harvel Quale, MD;  Location: [facility];  Service: Gastroenterology;  Laterality: N/A;   HERNIA REPAIR     x2, bilateral inguinal    KIDNEY STONE SURGERY     KNEE ARTHROSCOPY     X2   NASAL ENDOSCOPY     Patient Active Problem List   Diagnosis Date Noted   Unsteady gait when walking 09/28/2021   Need for immunization against influenza 09/28/2021   Anemia 05/23/2021   Chronic left hip pain 05/23/2021   Impaired ambulation 03/13/2021   Rhinorrhea 01/06/2021   Class 1 obesity due to excess calories with serious comorbidity and body mass index (BMI) of 33.0 to 33.9 in adult    Rectal bleeding    ABLA (acute blood loss anemia)    Acute GI bleeding 11/27/2020   COPD (chronic obstructive pulmonary disease) ([place]) 04/21/2019   Benign prostatic hyperplasia 03/21/2018   Chronic kidney disease, stage 2 (mild) 03/21/2018   Hypokalemia 03/21/2018   OSA on CPAP 03/21/2018   Hoarseness 01/17/2017   Hyperlipidemia 11/19/2013  AAA (abdominal aortic aneurysm) without rupture ([place]) 02/17/2013   Encounter for therapeutic drug monitoring 02/12/2013   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/2012   Essential hypertension 05/12/2012   Atrial fibrillation ([place]) 03/13/2012   GERD (gastroesophageal reflux disease) 10/08/2011   Constipation 10/08/2011   High risk medication use 10/08/2011    ONSET DATE: Chronic >3 years  REFERRING DIAG: R26.81 (ICD-10-CM) - Unsteady gait when walking  THERAPY DIAG:  Other abnormalities of gait and mobility  Unsteady gait  Rationale for Evaluation and Treatment: Rehabilitation  SUBJECTIVE:                                                                                                                                                                                              SUBJECTIVE STATEMENT: Patient presents to therapy with complaint of balance and gait disturbance. This is ongoing several years. He has been using Rolator for stability when walking. He has not had any falls.   Pt accompanied by: family member son Dominica Severin)  PERTINENT HISTORY: Catheter   PAIN:  Are you having pain? No  PRECAUTIONS: Fall  WEIGHT BEARING RESTRICTIONS: No  FALLS: Has patient fallen in last 6 months? No  LIVING ENVIRONMENT: Lives with: lives with their family and lives alone (has sitter during daytime Mon-Friday till about 7P, and frequent visits from family members, sometimes spend the night )  Lives in: House/apartment Stairs: No (has stairs to basement, has rail on LT going down) Has following equipment at home: Single point cane, Walker - 2 wheeled, and rollator, shower chair   PLOF: Needs assistance with ADLs  PATIENT GOALS: improve balance   OBJECTIVE:   DIAGNOSTIC FINDINGS: NA  COGNITION: Overall cognitive status: Within functional limits for tasks assessed    LOWER EXTREMITY MMT:  (patient unable to lay prone for hip extension testing)  MMT Right Eval Left Eval  Hip flexion 5 5  Hip extension    Hip abduction 4 4  Hip adduction    Hip internal rotation    Hip external rotation    Knee flexion    Knee extension 5 5  Ankle dorsiflexion 5 5  Ankle plantarflexion    Ankle inversion    Ankle eversion    (Blank rows = not tested)  BED MOBILITY:  Sit to supine Modified independence Supine to sit Modified independence Rolling to Right Modified independence Rolling to Left Modified independence  TRANSFERS: Assistive device utilized:  rollator   Sit to stand: Modified independence and SBA Stand to sit: Modified independence and SBA Chair to chair: Modified independence  and SBA  GAIT: Gait pattern: decreased step length- Right, decreased step length- Left, decreased stride length, and trunk flexed Distance walked: 20 feet  Assistive device utilized:  rollator Level of assistance: Modified independence Comments: during TUG  FUNCTIONAL TESTS:  5 times sit to stand: 17.15 sec with UEs Timed up and go (TUG): 18.72 sec with rollator   TODAY'S TREATMENT:                                                                                                                              DATE:  12/04/21 Eval    PATIENT EDUCATION: Education details: on eval findings, POC and HEP  Person educated: Patient and Child(ren) (son, Dominica Severin)  Education method: Explanation Education comprehension: verbalized understanding  HOME EXERCISE PROGRAM: 12/04/21  Seated heel/ toe raise LAQ Seated march   GOALS: SHORT TERM GOALS: Target date: 12/25/2021  Patient will be independent with initial HEP and self-management strategies to improve functional outcomes Baseline:  Goal status: INITIAL    LONG TERM GOALS: Target date: 01/29/2022  Patient will be independent with advanced HEP and self-management strategies to improve functional outcomes Baseline:  Goal status: INITIAL  2.  Patient will improve TUG score to < 12 seconds to indicate improvement in functional outcomes and reduced risk for falls  Baseline: 18.72 sec with rollator Goal status: INITIAL  3.  Patient will Improve 5 x STS to <12 seconds to indicate improvement in functional outcomes and reduced risk for falls  Baseline: 17.15 sec with UEs Goal status: INITIAL   ASSESSMENT:  CLINICAL IMPRESSION: Patient is a 88 y.o. male who presents to physical therapy with complaint of balance and gait disturbance. Patient demonstrates decreased strength, balance deficits and gait abnormalities which are negatively impacting patient ability to perform ADLs and functional mobility tasks. Patient will benefit from  skilled physical therapy services to address these deficits to improve level of function with ADLs, functional mobility tasks, and reduce risk for falls.    OBJECTIVE IMPAIRMENTS: Abnormal gait, decreased activity tolerance, decreased balance, decreased mobility, difficulty walking, decreased ROM, decreased strength, and improper body mechanics.   ACTIVITY LIMITATIONS: carrying, lifting, bending, standing, squatting, stairs, transfers, and locomotion level  PARTICIPATION LIMITATIONS: cleaning, laundry, community activity, and occupation  PERSONAL FACTORS: Age and Time since onset of injury/illness/exacerbation are also affecting patient's functional outcome.   REHAB POTENTIAL: Good  CLINICAL DECISION MAKING: Stable/uncomplicated  EVALUATION COMPLEXITY: Low  PLAN:  PT FREQUENCY: 1-2x/week  PT DURATION: 8 weeks  PLANNED INTERVENTIONS: Therapeutic exercises, Therapeutic activity, Neuromuscular re-education, Balance training, Gait training, Patient/Family education, Joint manipulation, Joint mobilization, Stair training, Aquatic Therapy, Dry Needling, Electrical stimulation, Spinal manipulation, Spinal mobilization, Cryotherapy, Moist heat, scar mobilization, Taping, Traction, Ultrasound, Biofeedback, Ionotophoresis '4mg'$ /ml Dexamethasone, and Manual therapy.   PLAN FOR NEXT SESSION: Progress functional strengthening and balance as able.    2:24 PM, 12/04/21 Josue Hector PT DPT  Physical Therapist with [facility]  [phone]

## 2021-12-11 ENCOUNTER — Other Ambulatory Visit: Payer: Self-pay | Admitting: Internal Medicine

## 2021-12-11 ENCOUNTER — Encounter (HOSPITAL_COMMUNITY): Payer: Self-pay | Admitting: Physical Therapy

## 2021-12-11 DIAGNOSIS — R2681 Unsteadiness on feet: Secondary | ICD-10-CM

## 2021-12-19 ENCOUNTER — Ambulatory Visit: Payer: Medicare PPO

## 2021-12-19 ENCOUNTER — Ambulatory Visit (INDEPENDENT_AMBULATORY_CARE_PROVIDER_SITE_OTHER): Payer: Medicare PPO | Admitting: Urology

## 2021-12-19 ENCOUNTER — Ambulatory Visit: Payer: Medicare PPO | Admitting: Internal Medicine

## 2021-12-19 ENCOUNTER — Encounter: Payer: Self-pay | Admitting: Urology

## 2021-12-19 ENCOUNTER — Encounter: Payer: Self-pay | Admitting: Internal Medicine

## 2021-12-19 VITALS — BP 147/69 | HR 79 | Ht 69.0 in | Wt 224.2 lb

## 2021-12-19 DIAGNOSIS — J449 Chronic obstructive pulmonary disease, unspecified: Secondary | ICD-10-CM

## 2021-12-19 DIAGNOSIS — G8929 Other chronic pain: Secondary | ICD-10-CM

## 2021-12-19 DIAGNOSIS — I10 Essential (primary) hypertension: Secondary | ICD-10-CM

## 2021-12-19 DIAGNOSIS — G4733 Obstructive sleep apnea (adult) (pediatric): Secondary | ICD-10-CM

## 2021-12-19 DIAGNOSIS — R339 Retention of urine, unspecified: Secondary | ICD-10-CM

## 2021-12-19 DIAGNOSIS — R2681 Unsteadiness on feet: Secondary | ICD-10-CM

## 2021-12-19 DIAGNOSIS — N182 Chronic kidney disease, stage 2 (mild): Secondary | ICD-10-CM

## 2021-12-19 DIAGNOSIS — R262 Difficulty in walking, not elsewhere classified: Secondary | ICD-10-CM | POA: Diagnosis not present

## 2021-12-19 DIAGNOSIS — M25552 Pain in left hip: Secondary | ICD-10-CM

## 2021-12-19 DIAGNOSIS — I4891 Unspecified atrial fibrillation: Secondary | ICD-10-CM | POA: Diagnosis not present

## 2021-12-19 DIAGNOSIS — N400 Enlarged prostate without lower urinary tract symptoms: Secondary | ICD-10-CM

## 2021-12-19 NOTE — Assessment & Plan Note (Signed)
PT previously ordered for strength training. However, he has requested a referral for home health PT today.  He will benefit from strength training as a way of augmenting his fall risk and maintaining his ability to perform ADLs independently. -HH PT ordered today

## 2021-12-19 NOTE — Progress Notes (Signed)
Cath Change/ Replacement  Patient is present today for a catheter change due to urinary retention.  19m of water was removed from the balloon, a 18FR foley cath was removed without difficulty.  Patient was cleaned and prepped in a sterile fashion with betadine.  A 18 FR foley cath was replaced into the bladder, no complications were noted. Urine return was noted 480mand urine was yellow in color. The balloon was filled with 1052mf sterile water. A leg bag was attached for drainage.  A night bag was also given to the patient and patient was given instruction on how to change from one bag to another. Patient was given proper instruction on catheter care.    Performed by: Hope LPN  Follow up: keep scheduled NV

## 2021-12-19 NOTE — Patient Instructions (Signed)
It was a pleasure to see you today.  Thank you for giving [place] the opportunity to be involved in your care.  Below is a brief recap of your visit and next steps.  We will plan to see you again in March.  Summary I have placed a referral for home health PT today We will plan for follow up in March

## 2021-12-19 NOTE — Progress Notes (Addendum)
Acute Office Visit  Subjective:     Patient ID: Tristan Lopez, male    DOB: 1930-03-07, 88 y.o.   MRN: [id number]  Chief Complaint  Patient presents with   Referral    [facility]   Tristan Lopez presents for an acute visit today in need of a home health referral for physical therapy.  He was last seen at [facility] 9/7 by Vena Rua, NP for routine follow-up.  At that time he was using a rollator for ambulation and physical therapy was requested for strength training.  A referral to PT was placed, noting that it was needed to prevent falls.  Tristan Lopez currently lives by himself and is able to perform ADLs independently.  His son is with him today and relays that it would be better for his father to receive physical therapy at home due to transportation concerns.  Previously expressed, but was told for insurance purposes he would need an in person evaluation to discuss.  Tristan Lopez is otherwise asymptomatic and has no acute concerns to discuss today  Review of Systems  Constitutional:  Negative for chills and fever.  HENT:  Negative for sore throat.   Respiratory:  Negative for cough and shortness of breath.   Cardiovascular:  Negative for chest pain, palpitations and leg swelling.  Gastrointestinal:  Negative for abdominal pain, blood in stool, constipation, diarrhea, nausea and vomiting.  Genitourinary:  Negative for dysuria and hematuria.  Musculoskeletal:  Negative for myalgias.  Skin:  Negative for itching and rash.  Neurological:  Negative for dizziness and headaches.  Psychiatric/Behavioral:  Negative for depression and suicidal ideas.       Objective:    BP (!) 147/69   Pulse 79   Ht '5\' 9"'$  (1.753 m)   Wt 224 lb 3.2 oz (101.7 kg)   SpO2 98%   BMI 33.11 kg/m    Physical Exam Vitals reviewed.  Constitutional:      Appearance: Normal appearance.  HENT:     Head: Normocephalic and atraumatic.     Right Ear: External ear normal.     Left Ear: External ear normal.      Nose: No congestion or rhinorrhea.     Mouth/Throat:     Mouth: Mucous membranes are moist.     Pharynx: Oropharynx is clear.  Eyes:     Extraocular Movements: Extraocular movements intact.     Pupils: Pupils are equal, round, and reactive to light.  Cardiovascular:     Rate and Rhythm: Normal rate. Rhythm irregular.     Pulses: Normal pulses.     Heart sounds: Murmur heard.  Pulmonary:     Effort: Pulmonary effort is normal.     Breath sounds: Normal breath sounds.  Abdominal:     General: Abdomen is flat. Bowel sounds are normal. There is no distension.     Palpations: Abdomen is soft.  Musculoskeletal:        General: Normal range of motion.  Skin:    General: Skin is warm and dry.     Capillary Refill: Capillary refill takes less than 2 seconds.     Coloration: Skin is not jaundiced.  Neurological:     General: No focal deficit present.     Mental Status: He is alert and oriented to person, place, and time.     Gait: Gait abnormal.     Comments: Ambulates with rollator       Assessment & Plan:   Problem List Items Addressed  This Visit       Unsteady gait when walking    PT previously ordered for strength training. However, he has requested a referral for home health PT today due to difficulty leaving his home because of chronic medical conditions.  Tristan Lopez past medical history is significant for atrial fibrillation, COPD, OSA, GERD, chronic urinary retention with indwelling Foley catheter, BPH, and chronic left hip pain.  These are all likely contributing to his impaired mobility and unsteady gait.  He will benefit from strength training as a way of augmenting his fall risk and maintaining his ability to perform ADLs independently. -HH PT ordered today      Relevant Orders   Ambulatory referral to [facility]   Return in about 14 weeks (around 03/27/2022).  Johnette Abraham, MD

## 2021-12-19 NOTE — Patient Instructions (Signed)

## 2021-12-28 ENCOUNTER — Other Ambulatory Visit: Payer: Self-pay | Admitting: Nurse Practitioner

## 2021-12-28 ENCOUNTER — Other Ambulatory Visit: Payer: Self-pay

## 2021-12-28 DIAGNOSIS — I4891 Unspecified atrial fibrillation: Secondary | ICD-10-CM

## 2021-12-28 MED ORDER — AMLODIPINE BESYLATE 5 MG PO TABS: 5.0000 mg | ORAL_TABLET | Freq: Every day | ORAL | 3 refills | Status: DC

## 2021-12-29 LAB — PROTIME-INR
INR: 2.2 — ABNORMAL HIGH (ref 0.9–1.2)
Prothrombin Time: 22.3 s — ABNORMAL HIGH (ref 9.1–12.0)

## 2021-12-29 LAB — POCT INR: INR: 2.2 (ref 2.0–3.0)

## 2022-01-01 ENCOUNTER — Encounter: Payer: Self-pay | Admitting: Internal Medicine

## 2022-01-01 ENCOUNTER — Telehealth: Payer: Self-pay | Admitting: Internal Medicine

## 2022-01-01 ENCOUNTER — Ambulatory Visit (INDEPENDENT_AMBULATORY_CARE_PROVIDER_SITE_OTHER): Payer: Medicare PPO | Admitting: *Deleted

## 2022-01-01 DIAGNOSIS — Z5181 Encounter for therapeutic drug level monitoring: Secondary | ICD-10-CM | POA: Diagnosis not present

## 2022-01-01 DIAGNOSIS — I4891 Unspecified atrial fibrillation: Secondary | ICD-10-CM | POA: Diagnosis not present

## 2022-01-01 IMAGING — CT CT CHEST W/O CM
2 of 4 series · 14 of 36 positions shown, 17 images · non-contrast
Comparison: Chest x-ray from earlier in the same day, CT from
06/06/2020

CLINICAL DATA: Possible mass on recent chest x-ray

EXAM:
CT CHEST WITHOUT CONTRAST
TECHNIQUE: Multidetector CT imaging of the chest was performed following the
standard protocol without IV contrast.

[Series 2: routine chest without · axial · non-contrast · 0.85mm/px · z∈[-298,-28]mm · 11 of 161 slices shown, 14 images]
[im 13/161  mediastinal]
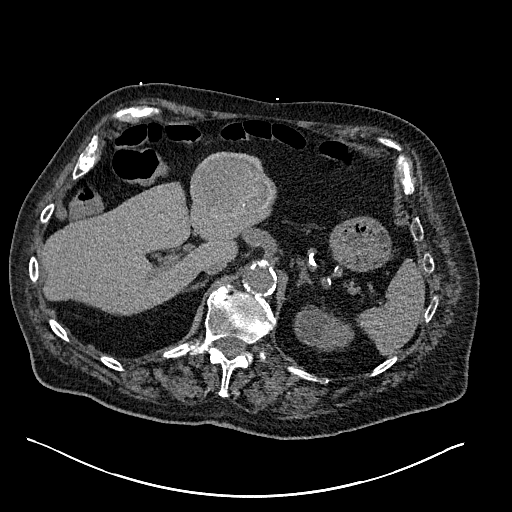
[im 13/161  lung]
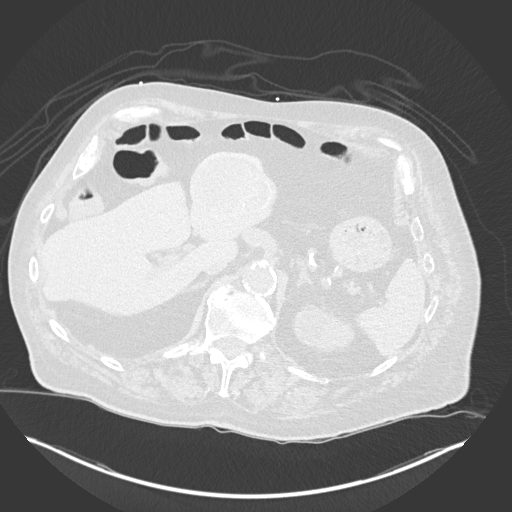
[im 25/161  lung]
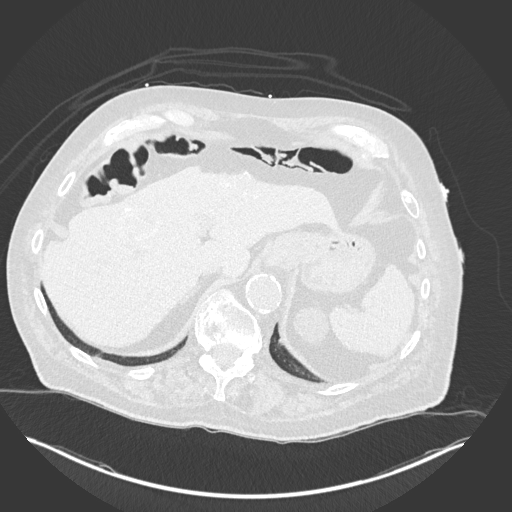
[im 37/161  lung]
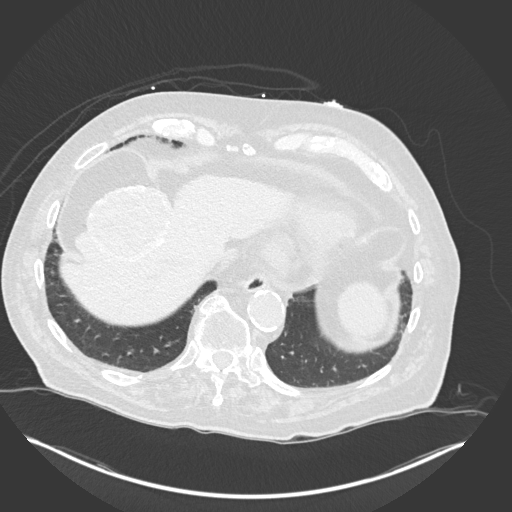
[im 50/161  lung]
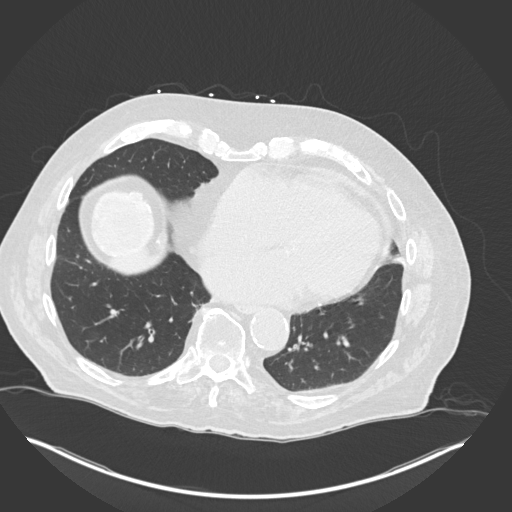
[im 62/161  mediastinal]
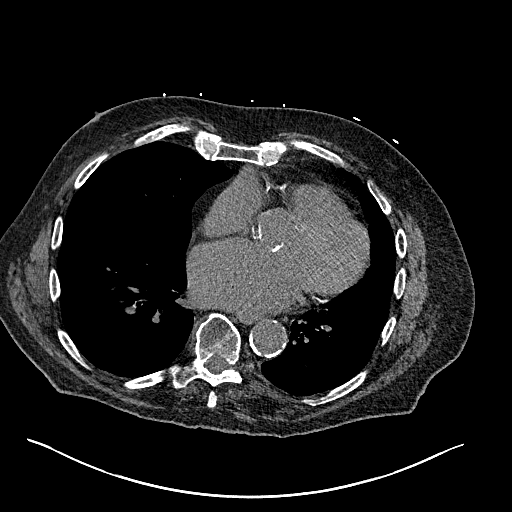
[im 62/161  lung]
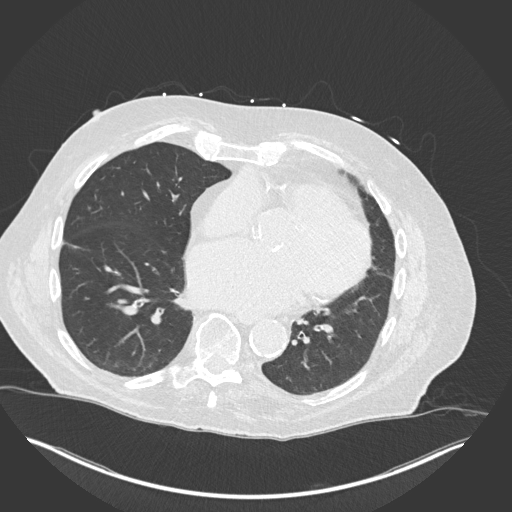
[im 87/161  lung]
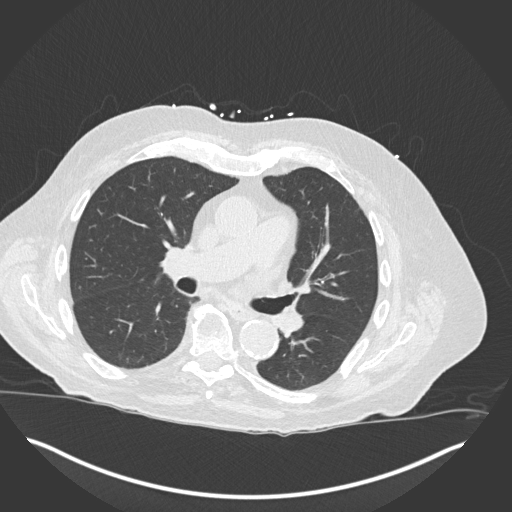
[im 99/161  lung]
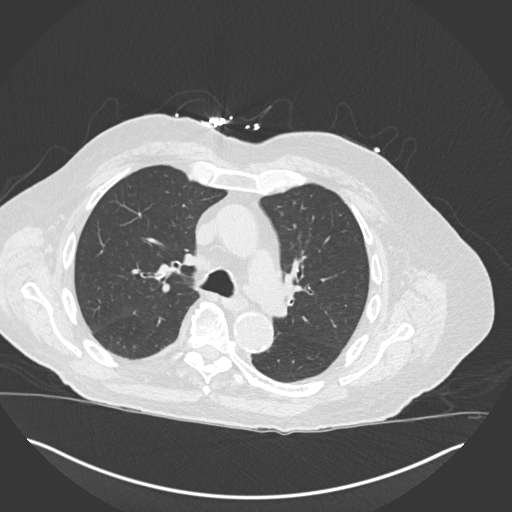
[im 111/161  lung]
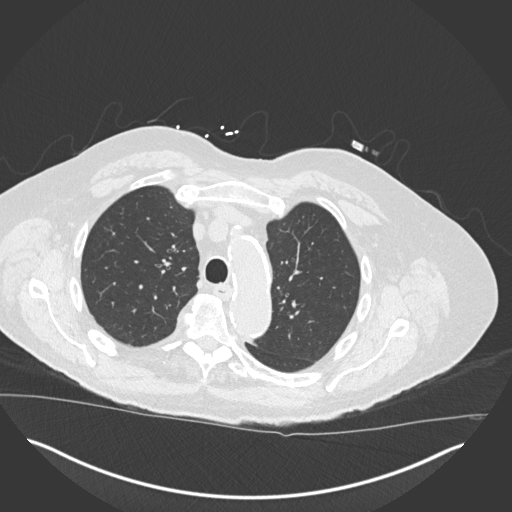
[im 124/161  mediastinal]
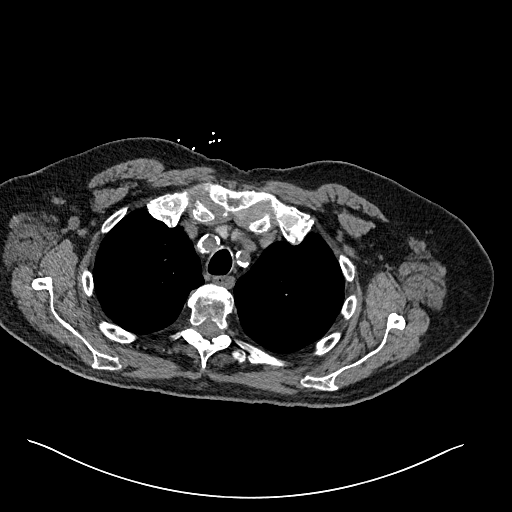
[im 124/161  lung]
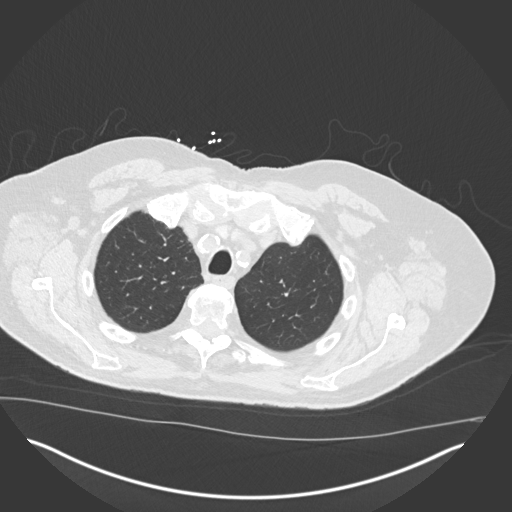
[im 136/161  lung]
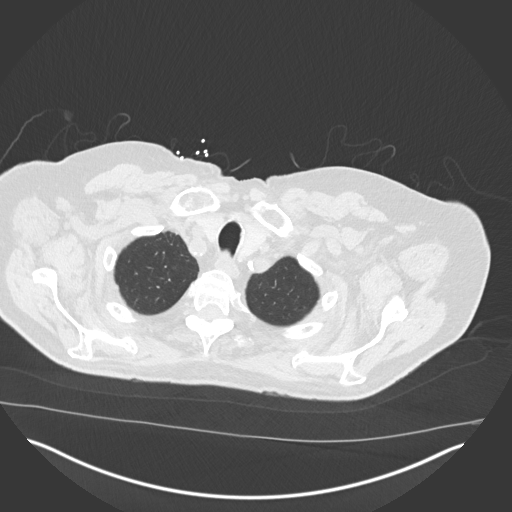
[im 148/161  lung]
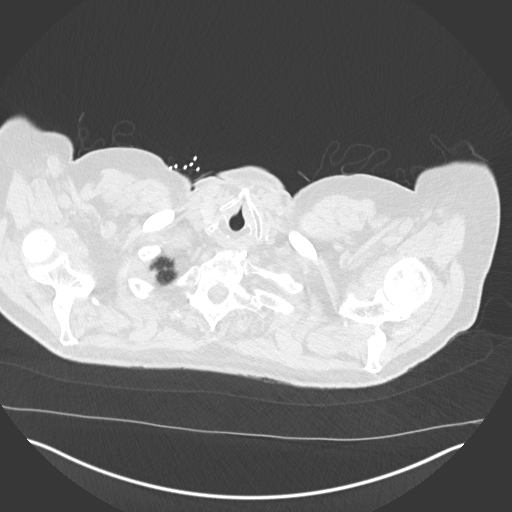

[Series 5: coronal · coronal · 0.64mm/px · 3 of 141 slices shown]
[im 29/141  lung]
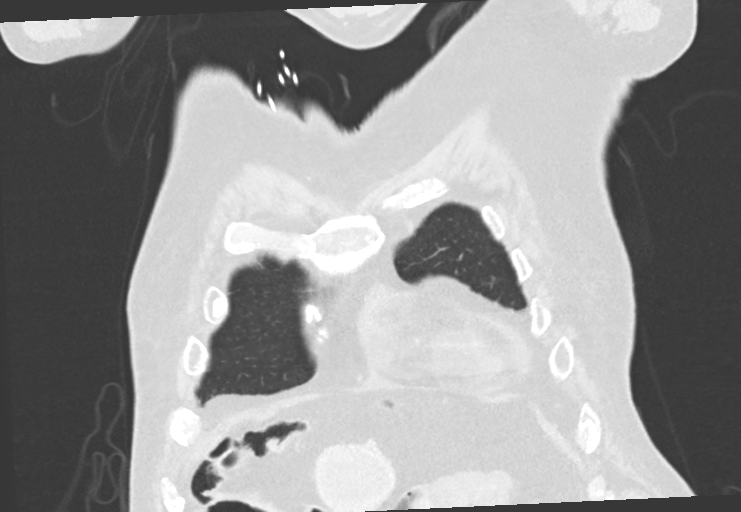
[im 57/141  lung]
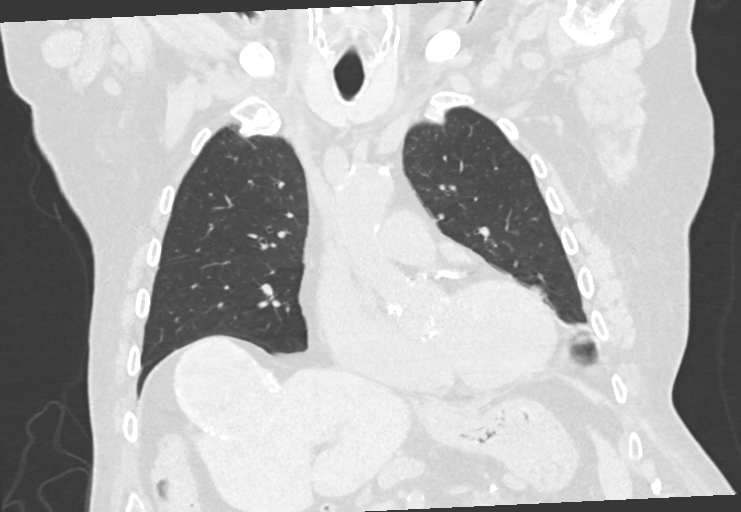
[im 85/141  lung]
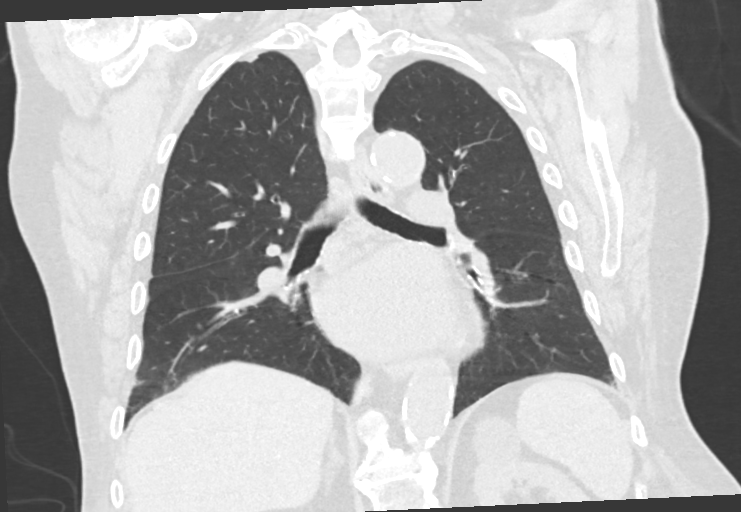

[14 of 36 positions shown; findings below may reference images not displayed]

FINDINGS: Cardiovascular: Somewhat limited due to lack of IV contrast.
Atherosclerotic calcifications are noted. No aneurysmal dilatation
is seen. Heart is mildly enlarged in size with diffuse coronary
calcifications identified.

Mediastinum/Nodes: Thoracic inlet is within normal limits. No
sizable hilar or mediastinal adenopathy is noted. The esophagus as
visualized is within normal limits.

Lungs/Pleura: Lungs are well aerated bilaterally. Right lung is
clear. No focal infiltrate or sizable effusion is noted. Left lung
demonstrates a focal fluid attenuation lesion along the left
pericardial border which has decreased in size when compared with
the prior exam. It previously measured 3.3 cm in greatest transverse
dimension and now measures 19 mm in greatest dimension. This likely
represents a resolving pericardial cyst or small amount of fluid
within the fissure. Given its decrease in size over the past 6
months it is felt to be benign in etiology. No other focal
abnormality is noted.

Upper Abdomen: As described in recent CT of the abdomen and pelvis
on the same day.

Musculoskeletal: Degenerative changes of the thoracic spine are
seen.
IMPRESSION: Decreasing fluid attenuation lesion along the left cardiac border
when compared with the prior exam. This may represent a shrinking
pericardial cyst or fluid within the fissure on the left. It is felt
to be benign in etiology given its decrease in size.

No other focal abnormality is noted.

Aortic Atherosclerosis (XRCSN-UD5.5).

## 2022-01-01 IMAGING — DX DG CHEST 1V
1 series · 1 of 1 positions shown · non-contrast
Comparison: 07/28/2019

CLINICAL DATA: Dyspnea.  Dizziness.  Weakness.

EXAM:
CHEST  1 VIEW

[chest pa]
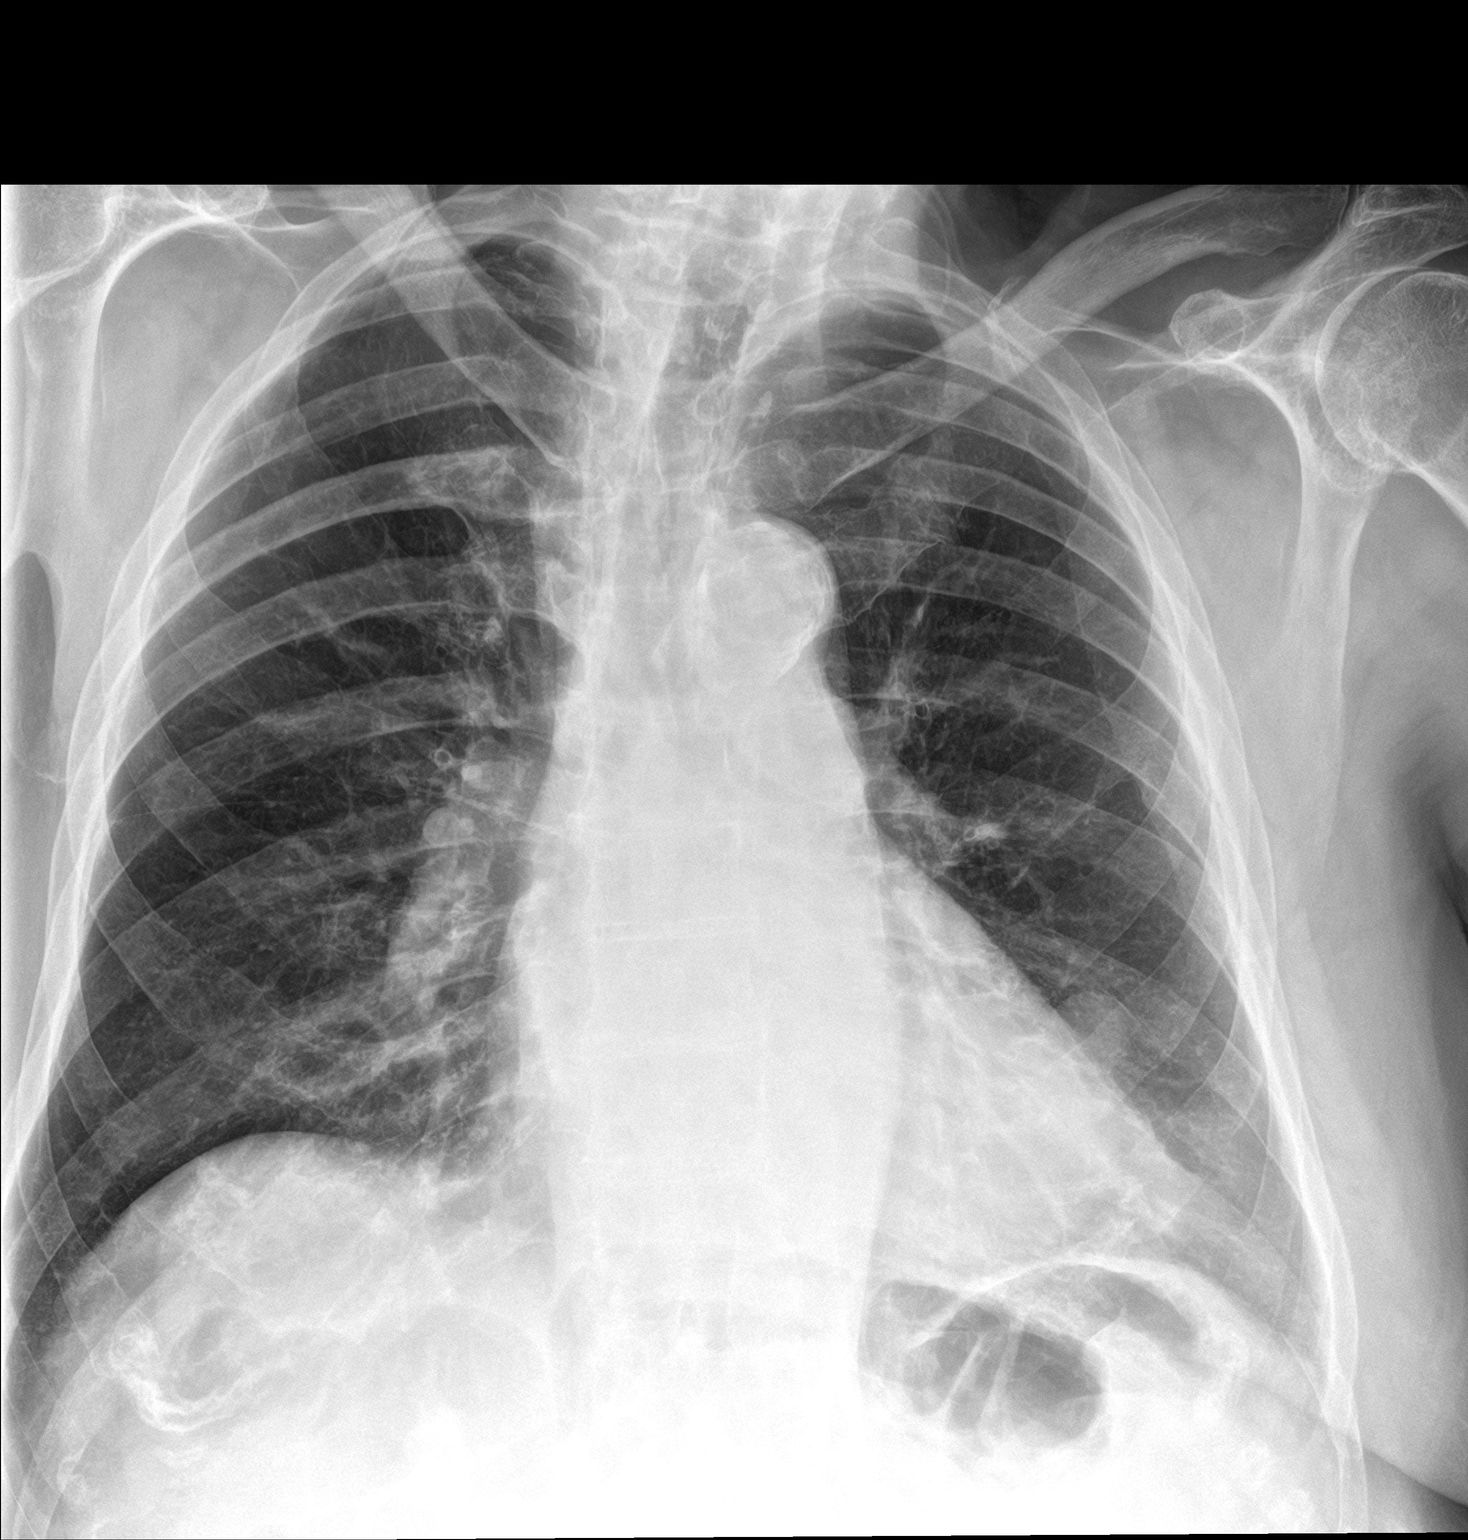

[1 of 1 positions shown; findings below may reference images not displayed]

FINDINGS: The heart size and mediastinal contours are within normal limits.
Aortic atherosclerotic calcification noted. No evidence of pulmonary
infiltrate or pleural effusion. A nodular opacity is also seen in
the left lower lung adjacent to the left heart border, which was not
seen on previous study.
IMPRESSION: Nodular opacity in left lower lung. Recommend chest CT without
contrast to exclude pulmonary neoplasm.

## 2022-01-01 IMAGING — CT CTA GI BLEED
3 of 12 series · 10 of 46 positions shown, 16 images · IV contrast (omnipaque)
Comparison: 06/06/2020

CLINICAL DATA: Rectal bleeding for 1 day

EXAM:
CTA ABDOMEN AND PELVIS WITHOUT AND WITH CONTRAST
TECHNIQUE: Multidetector CT imaging of the abdomen and pelvis was performed
using the standard protocol during bolus administration of
intravenous contrast. Multiplanar reconstructed images and MIPs were
obtained and reviewed to evaluate the vascular anatomy.
CONTRAST:  100mL OMNIPAQUE IOHEXOL 350 MG/ML SOLN

[Series 9: mesenteric axial arterial · axial · arterial · 0.98mm/px · z∈[-666,-462]mm · 4 of 255 slices shown]
[im 17/255  soft-tissue]
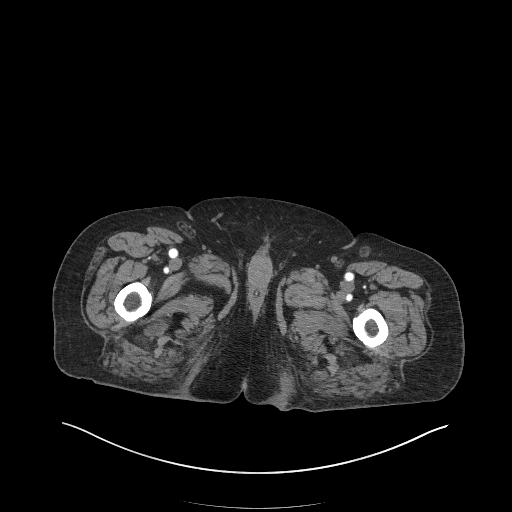
[im 51/255  soft-tissue]
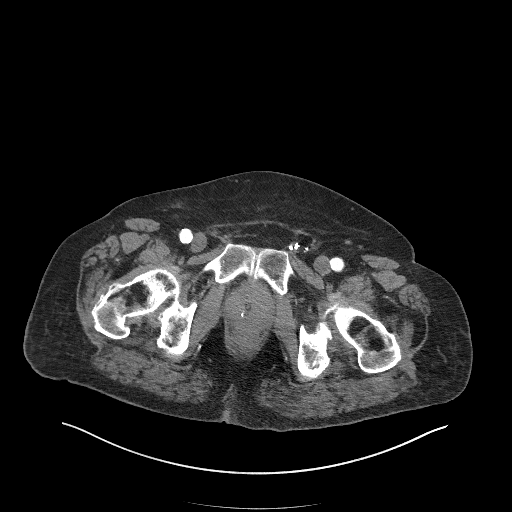
[im 85/255  soft-tissue]
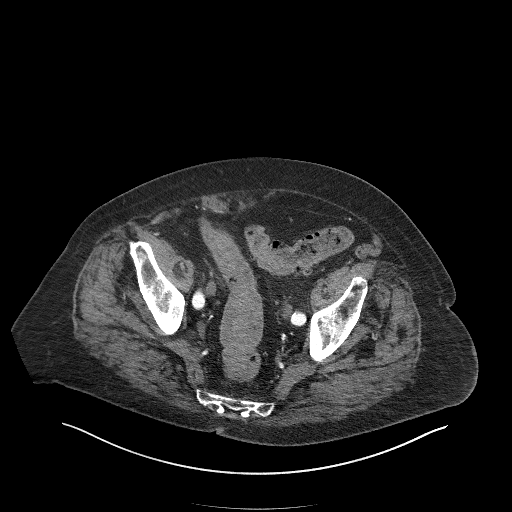
[im 119/255  soft-tissue]
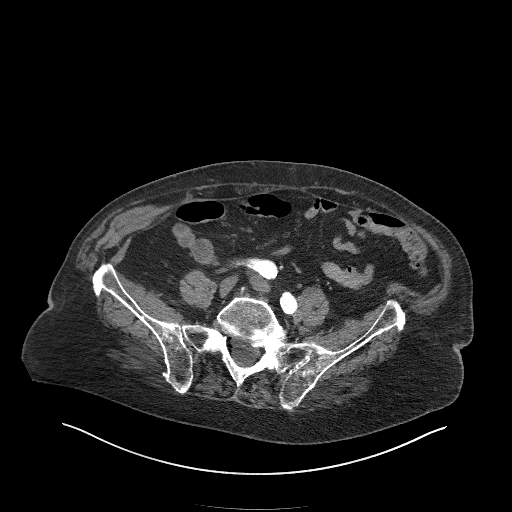

[Series 11: coronals · coronal · 0.92mm/px · 1 of 143 slices shown, 2 images]
[im 72/143  soft-tissue]
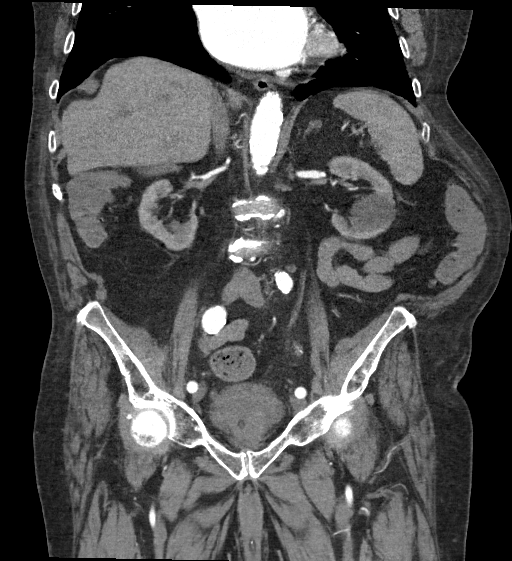
[im 72/143  bone]
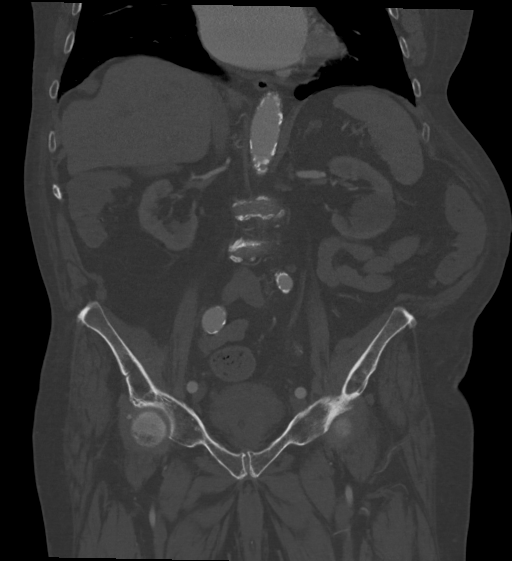

[Series 15: axial venous · axial · portal-venous · 0.97mm/px · z∈[-633,-293]mm · 5 of 103 slices shown, 10 images]
[im 18/103  soft-tissue]
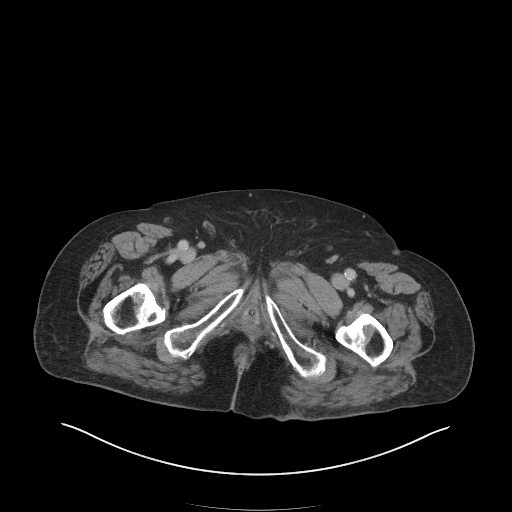
[im 18/103  bone]
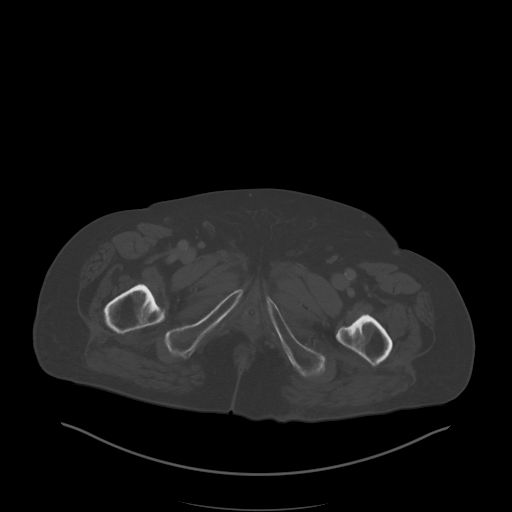
[im 35/103  soft-tissue]
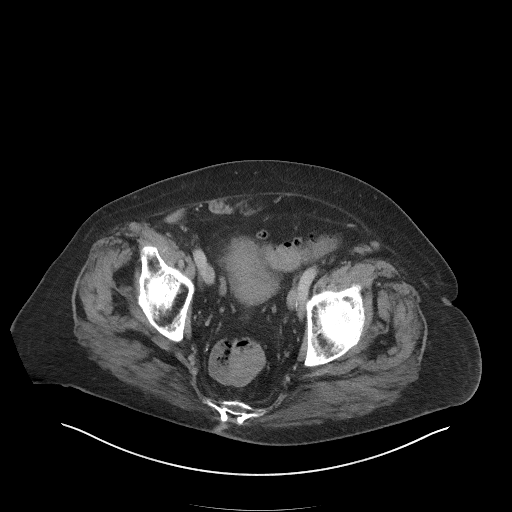
[im 35/103  lung]
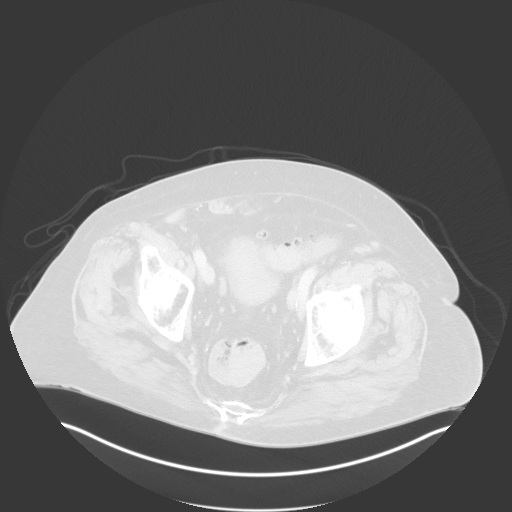
[im 52/103  soft-tissue]
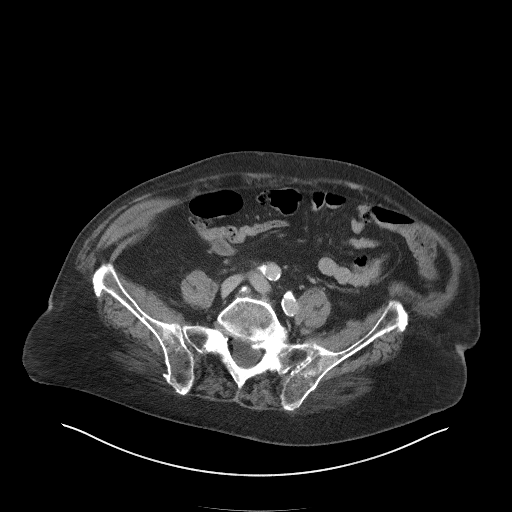
[im 52/103  lung]
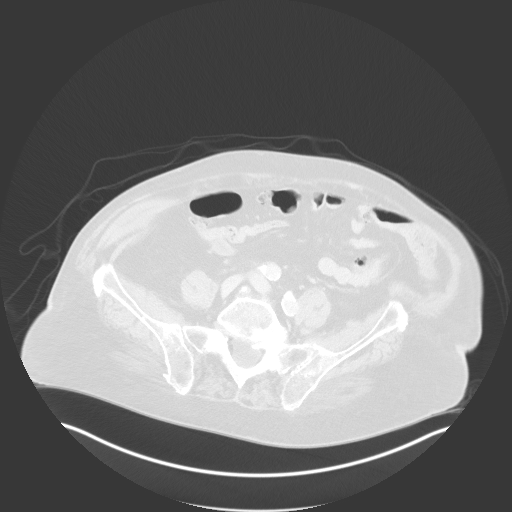
[im 69/103  soft-tissue]
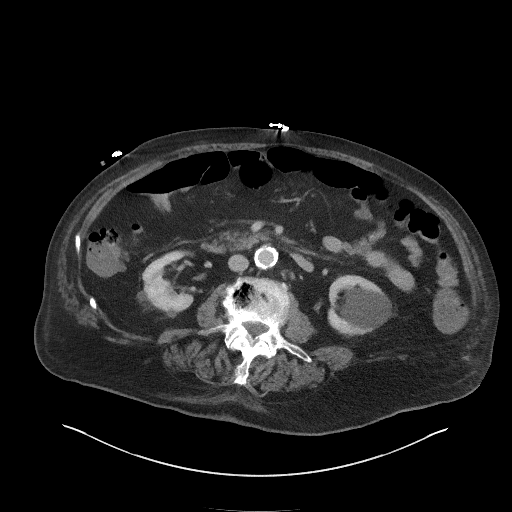
[im 69/103  lung]
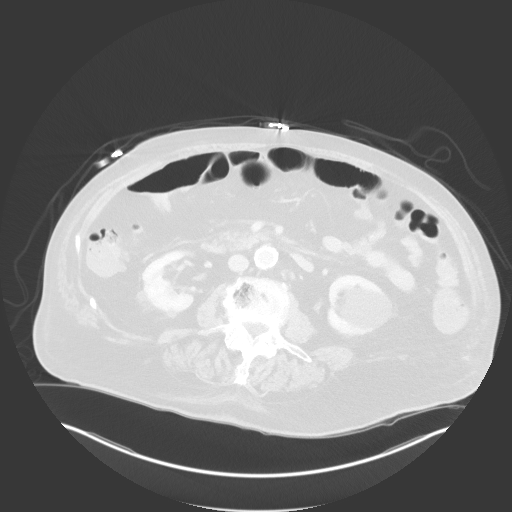
[im 86/103  soft-tissue]
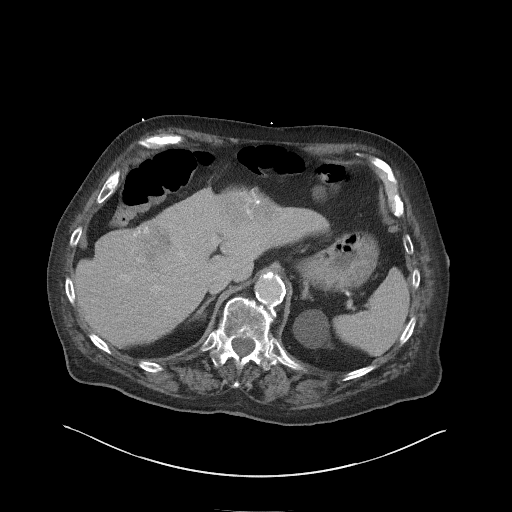
[im 86/103  lung]
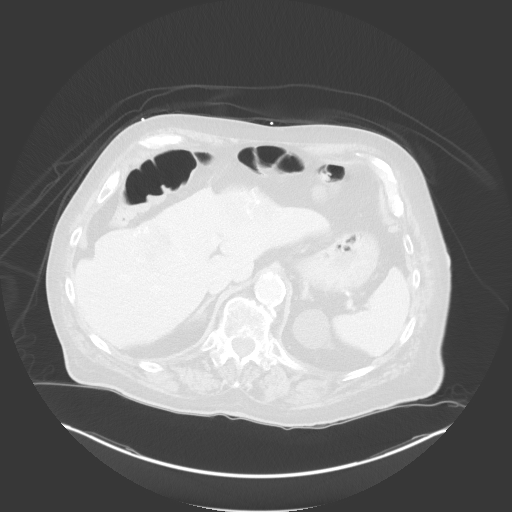

[10 of 46 positions shown; findings below may reference images not displayed]

FINDINGS: VASCULAR

Aorta: Atherosclerotic calcifications are noted. Fusiform dilatation
of the infrarenal aorta is noted to 3.9 cm. This is slightly
increased when compared with the prior exam at which time it
measured 3.8 cm. No evidence of dissection is noted. Tapering at the
aortic bifurcation is noted.

Celiac: Patent without evidence of aneurysm, dissection, vasculitis
or significant stenosis.

SMA: Atherosclerotic calcifications are noted although no
significant stenosis is seen.

Renals: Dual renal arteries are noted on the right. Single renal
artery is noted on the left. Heavy calcifications of the left renal
artery is seen with only mild stenosis. Mild narrowing at the origin
of the right main renal artery is noted as well.

IMA: Patent without evidence of aneurysm, dissection, vasculitis or
significant stenosis.

Inflow: Iliacs are heavily calcified although no aneurysmal
dilatation or dissection is seen.

Proximal Outflow: Within normal limits.

Veins: No specific venous abnormality is noted.

Review of the MIP images confirms the above findings.

NON-VASCULAR

Lower chest: Lung bases are free of acute infiltrate or sizable
effusion. There is a rounded fluid attenuation lesion identified
adjacent to the left pericardium which has decreased in size
significantly when compared with the prior CT examination at which
time it measured approximately 3.4 cm. It now measures 2.0 cm in
greatest dimension.

Hepatobiliary: Previously seen partially calcified lesion within the
dome of the right lobe of the liver anteriorly is again identified
and stable in appearance. A similar lesion is noted in the anterior
aspect of the lateral segment of the left lobe of the liver also
stable in appearance from the prior study. Gallbladder is within
normal limits. No other hepatic abnormality is seen.

Pancreas: Unremarkable. No pancreatic ductal dilatation or
surrounding inflammatory changes.

Spleen: Normal in size without focal abnormality.

Adrenals/Urinary Tract: Adrenal glands are within normal limits
bilaterally. Kidneys are well visualized bilaterally and again
demonstrate multiple bilateral renal cysts stable in appearance from
the prior exam. Scattered vascular calcifications are noted. No
definitive renal calculi or obstructive changes are seen. The
bladder is decompressed by Foley catheter.

Stomach/Bowel: Scattered diverticular change of the colon is noted.
No findings of diverticulitis are seen. No obstructive or
inflammatory changes are noted. The appendix is within normal
limits. No focal pooling of contrast material is identified to
suggest active GI hemorrhage. No small bowel or gastric abnormality
is noted.

Lymphatic: No significant lymphadenopathy is seen.

Reproductive: Prostate is enlarged indenting upon the inferior
aspect of the bladder.

Other: No abdominal wall hernia or abnormality. No abdominopelvic
ascites.

Musculoskeletal: Degenerative changes of lumbar spine are noted.
IMPRESSION: VASCULAR

Aneurysmal dilatation of the infrarenal aorta to 3.9 cm. This is
slightly increased in size when compared with the prior exam at
which time it measured 3.8 cm.

Recommend follow-up every 2 years. Reference: [HOSPITAL]
4019;[DATE].

No focal pooling of contrast material is identified to suggest
active GI hemorrhage.

Scattered atherosclerotic changes are noted as described.

NON-VASCULAR

Diverticular change without diverticulitis.

Stable renal cysts when compare with the prior exam.

Stable hepatic cystic lesions when compared with the prior CT.
Calcifications are seen likely related to prior hemorrhage.

Rounded fluid collection adjacent to the pericardium likely
representing a small pericardial cyst or trapped fluid within the
fissure. This is decreased in size from the most recent exam from

## 2022-01-01 NOTE — Patient Instructions (Signed)
Spoke with Glendell Docker, pt's son and advised pt to continue taking warfarin 5 mg daily except 2.'5mg'$  on Sundays and Thursdays. Recheck INR 02/05/21  Pt gets labs done at [facility] in [place].  Son Glendell Docker manages pts medicine. Lab orders were mailed to [place] address.

## 2022-01-01 NOTE — Telephone Encounter (Signed)
Tristan Lopez with [facility], [phone]   Called stating the pt start date for PT will be tomorrow 01/02/22. They were unable to see him sooner. Wanted to notify of delayed start date.

## 2022-01-01 NOTE — Telephone Encounter (Signed)
Returned call

## 2022-01-03 ENCOUNTER — Telehealth: Payer: Self-pay | Admitting: Internal Medicine

## 2022-01-03 NOTE — Telephone Encounter (Signed)
Tristan Lopez, [phone]   Wants verbal order for PT 2x wk for 4 wks?

## 2022-01-03 NOTE — Telephone Encounter (Signed)
Returned call and gave orders.

## 2022-01-09 ENCOUNTER — Encounter (HOSPITAL_COMMUNITY): Payer: Medicare PPO | Admitting: Physical Therapy

## 2022-01-12 ENCOUNTER — Ambulatory Visit (INDEPENDENT_AMBULATORY_CARE_PROVIDER_SITE_OTHER): Payer: Medicare PPO | Admitting: Urology

## 2022-01-12 DIAGNOSIS — R339 Retention of urine, unspecified: Secondary | ICD-10-CM

## 2022-01-12 MED ORDER — CLOTRIMAZOLE-BETAMETHASONE 1-0.05 % EX CREA: 1.0000 | TOPICAL_CREAM | Freq: Two times a day (BID) | CUTANEOUS | 0 refills | Status: DC

## 2022-01-12 NOTE — Progress Notes (Signed)
Cath Change/ Replacement  Patient is present today for a catheter change due to urinary retention.  45m of water was removed from the balloon, a 18FR foley cath was removed without difficulty.  Patient was cleaned and prepped in a sterile fashion with betadine and 2% lidocaine jelly was instilled into the urethra. A 18 FR foley cath was replaced into the bladder, no complications were noted. Urine return was noted 171mand urine was yellow in color. The balloon was filled with 1034mf sterile water. A leg bag was attached for drainage.  A night bag was also given to the patient and patient was given instruction on how to change from one bag to another. Patient was given proper instruction on catheter care.    Performed by: [email or web address]  Follow up: Keep next cath change

## 2022-01-12 NOTE — Addendum Note (Signed)
Addended by: Cleon Gustin on: 01/12/2022 11:12 AM   Modules accepted: Orders

## 2022-01-17 ENCOUNTER — Encounter (HOSPITAL_COMMUNITY): Payer: Medicare PPO

## 2022-01-24 ENCOUNTER — Encounter (HOSPITAL_COMMUNITY): Payer: Medicare PPO | Admitting: Physical Therapy

## 2022-01-31 ENCOUNTER — Encounter (HOSPITAL_COMMUNITY): Payer: Medicare PPO | Admitting: Physical Therapy

## 2022-02-05 ENCOUNTER — Other Ambulatory Visit: Payer: Self-pay | Admitting: Internal Medicine

## 2022-02-05 DIAGNOSIS — D508 Other iron deficiency anemias: Secondary | ICD-10-CM

## 2022-02-06 LAB — PROTIME-INR
INR: 2 — ABNORMAL HIGH (ref 0.9–1.2)
Prothrombin Time: 20.6 s — ABNORMAL HIGH (ref 9.1–12.0)

## 2022-02-07 ENCOUNTER — Encounter (HOSPITAL_COMMUNITY): Payer: Medicare PPO

## 2022-02-08 ENCOUNTER — Ambulatory Visit (INDEPENDENT_AMBULATORY_CARE_PROVIDER_SITE_OTHER): Payer: Medicare PPO | Admitting: *Deleted

## 2022-02-08 DIAGNOSIS — I4891 Unspecified atrial fibrillation: Secondary | ICD-10-CM

## 2022-02-08 DIAGNOSIS — Z5181 Encounter for therapeutic drug level monitoring: Secondary | ICD-10-CM | POA: Diagnosis not present

## 2022-02-08 NOTE — Patient Instructions (Signed)
Spoke with Glendell Docker, pt's son and advised pt to continue taking warfarin 5 mg daily except 2.'5mg'$  on Sundays and Thursdays. Recheck INR 03/19/22  Pt gets labs done at [facility] in [place].  Son Glendell Docker manages pts medicine. Lab orders were mailed to [facility] address.

## 2022-02-11 ENCOUNTER — Emergency Department (HOSPITAL_COMMUNITY)
Admission: EM | Admit: 2022-02-11 | Discharge: 2022-02-11 | Disposition: A | Discharge status: Home / Self Care | Payer: Medicare PPO | Attending: Emergency Medicine | Admitting: Emergency Medicine

## 2022-02-11 ENCOUNTER — Other Ambulatory Visit: Payer: Self-pay

## 2022-02-11 ENCOUNTER — Encounter (HOSPITAL_COMMUNITY): Payer: Self-pay

## 2022-02-11 DIAGNOSIS — Y738 Miscellaneous gastroenterology and urology devices associated with adverse incidents, not elsewhere classified: Secondary | ICD-10-CM | POA: Diagnosis not present

## 2022-02-11 DIAGNOSIS — I10 Essential (primary) hypertension: Secondary | ICD-10-CM | POA: Diagnosis not present

## 2022-02-11 DIAGNOSIS — T839XXA Unspecified complication of genitourinary prosthetic device, implant and graft, initial encounter: Secondary | ICD-10-CM

## 2022-02-11 DIAGNOSIS — Z91040 Latex allergy status: Secondary | ICD-10-CM | POA: Insufficient documentation

## 2022-02-11 DIAGNOSIS — Z7901 Long term (current) use of anticoagulants: Secondary | ICD-10-CM | POA: Diagnosis not present

## 2022-02-11 DIAGNOSIS — J449 Chronic obstructive pulmonary disease, unspecified: Secondary | ICD-10-CM | POA: Insufficient documentation

## 2022-02-11 DIAGNOSIS — T83091A Other mechanical complication of indwelling urethral catheter, initial encounter: Secondary | ICD-10-CM | POA: Diagnosis present

## 2022-02-11 NOTE — Discharge Instructions (Signed)
You were seen in the emergency department today for complication with your Foley catheter.  We attempted to irrigate it, but felt it was best to change it.  I recommend following up with the urologist as scheduled.  Continue to monitor how you're doing and return to the ER for new or worsening symptoms.

## 2022-02-11 NOTE — ED Triage Notes (Signed)
Pt reports foley replaced January 4th and it has stopped draining around noon today.

## 2022-02-11 NOTE — ED Provider Notes (Signed)
[facility] Provider Note   CSN: [id number] Arrival date & time: 02/11/22  1510     History  Chief Complaint  Patient presents with   foley not draining    Tristan Lopez is a 89 y.o. male with history of abdominal aortic aneurysm, BPH, chronic atrial fibrillation, COPD, hypertension, hyperlipidemia, tubular adenoma who presents emergency department complaining of problem with his Foley catheter.  Patient with chronic indwelling Foley catheter that is changed once a month by his urologist Dr. Alyson Ingles.  He has had the Foley for almost a year.  Mainly has pain in his penis, no significant abdominal pain, fevers or chills.  No dysuria or hematuria noted.  HPI     Home Medications Prior to Admission medications   Medication Sig Start Date End Date Taking? Authorizing Provider  amLODipine (NORVASC) 5 MG tablet Take 1 tablet (5 mg total) by mouth daily. 12/28/21   Tristan Abraham, MD  Ascorbic Acid (VITAMIN C) 1000 MG tablet Take 1,000 mg by mouth daily.    [provider]  bacitracin 500 UNIT/GM ointment Apply 1 application. topically 2 (two) times daily. 05/10/21   McKenzie, Tristan Furbish, MD  benazepril (LOTENSIN) 40 MG tablet Take 1 tablet (40 mg total) by mouth daily. 03/14/21   Strader, Fransisco Hertz, PA-C  Cholecalciferol (VITAMIN D-3) 1000 units CAPS Take 2,000 Units by mouth daily.    [provider]  clotrimazole (LOTRIMIN) 1 % cream Apply 1 Application topically 2 (two) times daily. 10/18/21   Summerlin, Tristan Heinrich, PA-C  clotrimazole-betamethasone (LOTRISONE) cream Apply 1 Application topically 2 (two) times daily. 01/12/22   McKenzie, Tristan Furbish, MD  FEROSUL 325 (65 Fe) MG tablet TAKE ONE TABLET BY MOUTH EVERY DAY 02/05/22   Tristan Abraham, MD  finasteride (PROSCAR) 5 MG tablet Take 1 tablet (5 mg total) by mouth daily. 11/21/21   McKenzie, Tristan Furbish, MD  furosemide (LASIX) 20 MG tablet TAKE 1 TABLET (20 MG TOTAL)  BY MOUTH DAILY. TAKE 20 MG DAILY AS NEEDED FOR LEG SWELLING 11/03/21   Tristan Abraham, MD  Garlic 1027 MG CAPS Take 1,000 mg by mouth daily.    [provider]  hydrocortisone (ANUSOL-HC) 25 MG suppository Place 1 suppository (25 mg total) rectally 2 (two) times daily. 08/30/21   Tristan Jefferson, PA-C  pantoprazole (PROTONIX) 40 MG tablet TAKE 1 TABLET (40 MG TOTAL) BY MOUTH DAILY. 09/29/21   Tristan Abraham, MD  potassium chloride (KLOR-CON) 10 MEQ tablet TAKE 1 TABLET (10 MEQ TOTAL) BY MOUTH DAILY. 07/03/21   Paseda, Tristan Conger, FNP  potassium citrate (UROCIT-K) 10 MEQ (1080 MG) SR tablet TAKE 1 TABLET (10 MEQ TOTAL) BY MOUTH DAILY. 11/03/21   Tristan Abraham, MD  Probiotic Product (DIGESTIVE ADVANTAGE PO) Take by mouth 2 (two) times daily.     [provider]  propranolol (INDERAL) 20 MG tablet Take 1 tablet (20 mg total) by mouth daily. 03/14/21   Strader, Fransisco Hertz, PA-C  rosuvastatin (CRESTOR) 10 MG tablet Take 1 tablet (10 mg total) by mouth daily. 03/14/21   Strader, Fransisco Hertz, PA-C  tadalafil (CIALIS) 5 MG tablet TAKE ONE TABLET BY MOUTH EVERY DAY 12/29/21   Tristan Abraham, MD  warfarin (COUMADIN) 5 MG tablet TAKE 1 TABLET (5 MG TOTAL) BY MOUTH DAILY. 11/03/21   Tristan Abraham, MD      Allergies    Latex    Review of Systems   Review  of Systems  Genitourinary:  Positive for difficulty urinating.  All other systems reviewed and are negative.   Physical Exam Updated Vital Signs BP (!) 175/87 (BP Location: Right Arm)   Pulse 89   Temp 97.9 F (36.6 C) (Oral)   Resp 18   Ht '5\' 9"'$  (1.753 m)   Wt 104.3 kg   SpO2 100%   BMI 33.97 kg/m  Physical Exam Vitals and nursing note reviewed.  Constitutional:      Appearance: Normal appearance.  HENT:     Head: Normocephalic and atraumatic.  Eyes:     Conjunctiva/sclera: Conjunctivae normal.  Pulmonary:     Effort: Pulmonary effort is normal. No respiratory distress.  Abdominal:     Palpations: Abdomen is soft.      Tenderness: There is no abdominal tenderness.  Skin:    General: Skin is warm and dry.  Neurological:     Mental Status: He is alert.  Psychiatric:        Mood and Affect: Mood normal.        Behavior: Behavior normal.    ED Results / Procedures / Treatments   Labs (all labs ordered are listed, but only abnormal results are displayed) Labs Reviewed - No data to display  EKG None  Radiology No results found.  Procedures Procedures    Medications Ordered in ED Medications - No data to display  ED Course/ Medical Decision Making/ A&P                             Medical Decision Making This patient is a 89 y.o. male who presents to the ED for concern of difficulty with foley catheter.   Past Medical History / Social History / Additional history: Chart reviewed. Pertinent results include: abdominal aortic aneurysm, BPH, chronic atrial fibrillation, COPD, hypertension, hyperlipidemia, tubular adenoma  Physical Exam: Physical exam performed. The pertinent findings include: Per tensive, otherwise normal vital signs.  Appears uncomfortable.  Abdomen soft, nontender.  Medications / Treatment: Attempted flushing of the Foley catheter, with only mild drainage of urine.  Foley catheter changed to size 18.   Disposition: After consideration of the diagnostic results and the patients response to treatment, I feel that emergency department workup does not suggest an emergent condition requiring admission or immediate intervention beyond what has been performed at this time. The plan is: Discharge to home with urology follow-up. The patient is safe for discharge and has been instructed to return immediately for worsening symptoms, change in symptoms or any other concerns.  Final Clinical Impression(s) / ED Diagnoses Final diagnoses:  Complication of Foley catheter, initial encounter [facility])    Rx / DC Orders ED Discharge Orders     None      Portions of this report may  have been transcribed using voice recognition software. Every effort was made to ensure accuracy; however, inadvertent computerized transcription errors may be present.    Tristan Lopez 02/11/22 1805    Tristan Pick, MD 02/14/22 [phone]

## 2022-02-12 ENCOUNTER — Telehealth: Payer: Self-pay

## 2022-02-12 ENCOUNTER — Ambulatory Visit (INDEPENDENT_AMBULATORY_CARE_PROVIDER_SITE_OTHER): Payer: Medicare PPO | Admitting: Urology

## 2022-02-12 DIAGNOSIS — R339 Retention of urine, unspecified: Secondary | ICD-10-CM | POA: Diagnosis not present

## 2022-02-12 NOTE — Progress Notes (Signed)
Patient presents today with complaints of catheter not draining.  Catheter was anchored low on the leg causing the catheter to pull and the patient was having gross hematuria.  10cc of sterile water was drained from the balloon and the catheter was readjusted and 10cc of sterile water filled the balloon.  The catheter was flushed with 200cc of sterile water with return.  Patient instructed to return to office with any other issues.    Tristan Lopez, [place]

## 2022-02-12 NOTE — Telephone Encounter (Signed)
Transition Care Management Follow-up Telephone Call Date of discharge and from where: 02/11/2022 How have you been since you were released from the hospital? Daughter says doing fine.per her brother (who pt is with) catheter is draining and pt doing alright Any questions or concerns? No  Items Reviewed: Did the pt receive and understand the discharge instructions provided? Yes per daughter poa Medications obtained and verified? No  Other? No  Any new allergies since your discharge? No  Dietary orders reviewed? No Do you have support at home? Yes    Functional Questionnaire: (I = Independent and D = Dependent) ADLs: All done w/assistance..some independence.daughter relays  Bathing/Dressing- I  Meal Prep- I/D  Eating- I  Maintaining continence- D  Transferring/Ambulation- D/I  Managing Meds- D?I  Follow up appointments reviewed:  PCP Hospital f/u appt confirmed? Yes  Scheduled to see Nurse Visit on 02/12/22 @ 0900. [facility] f/u appt confirmed? No  Are transportation arrangements needed? No  If their condition worsens, is the pt aware to call PCP or go to the Emergency Dept.? Yes Was the patient provided with contact information for the PCP's office or ED? Yes Was to pt encouraged to call back with questions or concerns? Yes

## 2022-02-13 ENCOUNTER — Ambulatory Visit (HOSPITAL_COMMUNITY): Payer: Medicare PPO

## 2022-02-14 ENCOUNTER — Ambulatory Visit (INDEPENDENT_AMBULATORY_CARE_PROVIDER_SITE_OTHER): Payer: Medicare PPO | Admitting: Urology

## 2022-02-14 ENCOUNTER — Encounter (HOSPITAL_COMMUNITY): Payer: Medicare PPO | Admitting: Physical Therapy

## 2022-02-14 DIAGNOSIS — R339 Retention of urine, unspecified: Secondary | ICD-10-CM | POA: Diagnosis not present

## 2022-02-14 NOTE — Progress Notes (Signed)
Patient presents today with complaints of his catheter pulling.  Upon evaluation I noticed the patient's achor was not attached to his leg and his catheter bag was pulling causing tension in the catheter.  72m of sterile water was removed from the balloon and the catheter was repositioned in place.  The balloon was filled with 20cc of sterile water.  I flushed the catheter with 1466mof sterile water with return.  Patient had bladder spasms while flushing his catheter and states this is similar to the pain he had been experiencing at home.  Verbal from Dr. McAlyson Ingleso provide samples of Myrbetriq '25mg'$ .  Samples provided. The patient refused the anchor and the bag was secured high on his leg.  I encouraged patient to increase his water intake and to call the office if he had any other issues and we can have his apt with MD moved to a sooner date.  Patient son also informed and voiced understanding.   KoLevi AlandCMNoank

## 2022-02-15 ENCOUNTER — Other Ambulatory Visit: Payer: Self-pay

## 2022-02-15 ENCOUNTER — Encounter (HOSPITAL_COMMUNITY): Payer: Self-pay | Admitting: *Deleted

## 2022-02-15 ENCOUNTER — Telehealth: Payer: Self-pay

## 2022-02-15 ENCOUNTER — Emergency Department (HOSPITAL_COMMUNITY)
Admission: EM | Admit: 2022-02-15 | Discharge: 2022-02-15 | Disposition: A | Discharge status: Home / Self Care | Payer: Medicare PPO | Attending: Emergency Medicine | Admitting: Emergency Medicine

## 2022-02-15 ENCOUNTER — Emergency Department (HOSPITAL_COMMUNITY): Payer: Medicare PPO

## 2022-02-15 DIAGNOSIS — Z7901 Long term (current) use of anticoagulants: Secondary | ICD-10-CM | POA: Insufficient documentation

## 2022-02-15 DIAGNOSIS — N3000 Acute cystitis without hematuria: Secondary | ICD-10-CM | POA: Diagnosis not present

## 2022-02-15 DIAGNOSIS — N3289 Other specified disorders of bladder: Secondary | ICD-10-CM | POA: Diagnosis not present

## 2022-02-15 DIAGNOSIS — Z960 Presence of urogenital implants: Secondary | ICD-10-CM | POA: Insufficient documentation

## 2022-02-15 DIAGNOSIS — I714 Abdominal aortic aneurysm, without rupture, unspecified: Secondary | ICD-10-CM | POA: Diagnosis not present

## 2022-02-15 DIAGNOSIS — Z91040 Latex allergy status: Secondary | ICD-10-CM | POA: Insufficient documentation

## 2022-02-15 DIAGNOSIS — N3090 Cystitis, unspecified without hematuria: Secondary | ICD-10-CM

## 2022-02-15 DIAGNOSIS — R1030 Lower abdominal pain, unspecified: Secondary | ICD-10-CM | POA: Diagnosis present

## 2022-02-15 LAB — URINALYSIS, ROUTINE W REFLEX MICROSCOPIC
Bilirubin Urine: NEGATIVE
Glucose, UA: NEGATIVE mg/dL
Hgb urine dipstick: NEGATIVE
Ketones, ur: NEGATIVE mg/dL
Nitrite: NEGATIVE
Protein, ur: 100 mg/dL — AB
Specific Gravity, Urine: 1.013 (ref 1.005–1.030)
WBC, UA: >50 WBC/hpf (ref 0–5)
pH: 8 (ref 5.0–8.0)

## 2022-02-15 LAB — CBC WITH DIFFERENTIAL/PLATELET
Abs Immature Granulocytes: 0.01 10*3/uL (ref 0.00–0.07)
Basophils Absolute: 0 10*3/uL (ref 0.0–0.1)
Basophils Relative: 0 %
Eosinophils Absolute: 0.1 10*3/uL (ref 0.0–0.5)
Eosinophils Relative: 2 %
HCT: 35.7 % — ABNORMAL LOW (ref 39.0–52.0)
Hemoglobin: 11.1 g/dL — ABNORMAL LOW (ref 13.0–17.0)
Immature Granulocytes: 0 %
Lymphocytes Relative: 25 %
Lymphs Abs: 1.3 10*3/uL (ref 0.7–4.0)
MCH: 29.6 pg (ref 26.0–34.0)
MCHC: 31.1 g/dL (ref 30.0–36.0)
MCV: 95.2 fL (ref 80.0–100.0)
Monocytes Absolute: 0.6 10*3/uL (ref 0.1–1.0)
Monocytes Relative: 13 %
Neutro Abs: 3.1 10*3/uL (ref 1.7–7.7)
Neutrophils Relative %: 60 %
Platelets: 182 10*3/uL (ref 150–400)
RBC: 3.75 MIL/uL — ABNORMAL LOW (ref 4.22–5.81)
RDW: 14.1 % (ref 11.5–15.5)
WBC: 5.1 10*3/uL (ref 4.0–10.5)
nRBC: 0 % (ref 0.0–0.2)

## 2022-02-15 LAB — BASIC METABOLIC PANEL
Anion gap: 9 (ref 5–15)
BUN: 23 mg/dL (ref 8–23)
CO2: 25 mmol/L (ref 22–32)
Calcium: 8.8 mg/dL — ABNORMAL LOW (ref 8.9–10.3)
Chloride: 103 mmol/L (ref 98–111)
Creatinine, Ser: 0.91 mg/dL (ref 0.61–1.24)
GFR, Estimated: >60 mL/min (ref >60)
Glucose, Bld: 106 mg/dL — ABNORMAL HIGH (ref 70–99)
Potassium: 3.6 mmol/L (ref 3.5–5.1)
Sodium: 137 mmol/L (ref 135–145)

## 2022-02-15 MED ORDER — SODIUM CHLORIDE 0.9 % IV SOLN
1.0000 g | Freq: Once | INTRAVENOUS | Status: AC
  Administered 2022-02-15: 1 g via INTRAVENOUS
  Filled 2022-02-15: qty 10

## 2022-02-15 MED ORDER — MIRABEGRON ER 25 MG PO TB24: 25.0000 mg | ORAL_TABLET | Freq: Every day | ORAL | 0 refills | Status: DC

## 2022-02-15 MED ORDER — CEPHALEXIN 500 MG PO CAPS: 500.0000 mg | ORAL_CAPSULE | Freq: Two times a day (BID) | ORAL | 0 refills | Status: DC

## 2022-02-15 MED ORDER — SODIUM CHLORIDE 0.9 % IV BOLUS
500.0000 mL | Freq: Once | INTRAVENOUS | Status: AC
  Administered 2022-02-15: 500 mL via INTRAVENOUS

## 2022-02-15 MED ORDER — MIRABEGRON ER 25 MG PO TB24
25.0000 mg | ORAL_TABLET | Freq: Every day | ORAL | Status: DC
  Administered 2022-02-15: 25 mg via ORAL
  Filled 2022-02-15: qty 1

## 2022-02-15 NOTE — Discharge Instructions (Addendum)
You are seen in the emergency department for continued pain in your bladder and penis.  Your lab work looked fairly unremarkable but your urine showed signs of infection.  You may be chronically colonized due to having a catheter in but we are going to start you on some antibiotics.  We are also prescribing some medication for bladder spasms.  Please contact urology for close follow-up.  Your aneurysm in your abdomen also looks slightly enlarged and this will need follow-up as an outpatient.

## 2022-02-15 NOTE — ED Notes (Signed)
Pt care taken, changed the leg bag to obtain a urine specimen, bladder scanned, with only 71 ml of fluid in bladder. Did tell me that he had a known kidney stone in the rt kidney.

## 2022-02-15 NOTE — ED Provider Notes (Signed)
[facility] Provider Note   CSN: [id number] Arrival date & time: 02/15/22  1834     History  Chief Complaint  Patient presents with   Abdominal Pain    Tristan Lopez is a 89 y.o. male.  He is brought in by his daughter for evaluation of low abdominal pain penis pain possibly associated with his Foley catheter.  He said this pain has been ongoing for weeks on and off.  He saw urology 3 days ago and again yesterday, had his Foley irrigated and repositioned.  They were concerned he may have some bladder spasms.  No fevers or chills no nausea or vomiting.  The history is provided by the patient.  Abdominal Pain Pain location:  Suprapubic Pain radiates to:  Perineum Pain severity:  Severe Onset quality:  Gradual Timing:  Intermittent Progression:  Unchanged Context: not trauma   Relieved by:  Nothing Worsened by:  Nothing Ineffective treatments:  Lying down Associated symptoms: hematuria (intermittent)   Associated symptoms: no chest pain, no cough, no diarrhea, no fever, no nausea, no shortness of breath and no vomiting   Risk factors: being elderly        Home Medications Prior to Admission medications   Medication Sig Start Date End Date Taking? Authorizing Provider  amLODipine (NORVASC) 5 MG tablet Take 1 tablet (5 mg total) by mouth daily. 12/28/21   Johnette Abraham, MD  Ascorbic Acid (VITAMIN C) 1000 MG tablet Take 1,000 mg by mouth daily.    [provider]  bacitracin 500 UNIT/GM ointment Apply 1 application. topically 2 (two) times daily. 05/10/21   McKenzie, Candee Furbish, MD  benazepril (LOTENSIN) 40 MG tablet Take 1 tablet (40 mg total) by mouth daily. 03/14/21   Strader, Fransisco Hertz, PA-C  Cholecalciferol (VITAMIN D-3) 1000 units CAPS Take 2,000 Units by mouth daily.    [provider]  clotrimazole (LOTRIMIN) 1 % cream Apply 1 Application topically 2 (two) times daily. 10/18/21   Summerlin, Berneice Heinrich, PA-C  clotrimazole-betamethasone (LOTRISONE) cream Apply 1 Application topically 2 (two) times daily. 01/12/22   McKenzie, Candee Furbish, MD  FEROSUL 325 (65 Fe) MG tablet TAKE ONE TABLET BY MOUTH EVERY DAY 02/05/22   Johnette Abraham, MD  finasteride (PROSCAR) 5 MG tablet Take 1 tablet (5 mg total) by mouth daily. 11/21/21   McKenzie, Candee Furbish, MD  furosemide (LASIX) 20 MG tablet TAKE 1 TABLET (20 MG TOTAL) BY MOUTH DAILY. TAKE 20 MG DAILY AS NEEDED FOR LEG SWELLING 11/03/21   Johnette Abraham, MD  Garlic 3329 MG CAPS Take 1,000 mg by mouth daily.    [provider]  hydrocortisone (ANUSOL-HC) 25 MG suppository Place 1 suppository (25 mg total) rectally 2 (two) times daily. 08/30/21   Evalee Jefferson, PA-C  pantoprazole (PROTONIX) 40 MG tablet TAKE 1 TABLET (40 MG TOTAL) BY MOUTH DAILY. 09/29/21   Johnette Abraham, MD  potassium chloride (KLOR-CON) 10 MEQ tablet TAKE 1 TABLET (10 MEQ TOTAL) BY MOUTH DAILY. 07/03/21   Paseda, Dewaine Conger, FNP  potassium citrate (UROCIT-K) 10 MEQ (1080 MG) SR tablet TAKE 1 TABLET (10 MEQ TOTAL) BY MOUTH DAILY. 11/03/21   Johnette Abraham, MD  Probiotic Product (DIGESTIVE ADVANTAGE PO) Take by mouth 2 (two) times daily.     [provider]  propranolol (INDERAL) 20 MG tablet Take 1 tablet (20 mg total) by mouth daily. 03/14/21   Strader, Fransisco Hertz, PA-C  rosuvastatin (CRESTOR)  10 MG tablet Take 1 tablet (10 mg total) by mouth daily. 03/14/21   Strader, Fransisco Hertz, PA-C  tadalafil (CIALIS) 5 MG tablet TAKE ONE TABLET BY MOUTH EVERY DAY 12/29/21   Johnette Abraham, MD  warfarin (COUMADIN) 5 MG tablet TAKE 1 TABLET (5 MG TOTAL) BY MOUTH DAILY. 11/03/21   Johnette Abraham, MD      Allergies    Latex    Review of Systems   Review of Systems  Constitutional:  Negative for fever.  Respiratory:  Negative for cough and shortness of breath.   Cardiovascular:  Negative for chest pain.  Gastrointestinal:  Positive for abdominal pain. Negative for diarrhea,  nausea and vomiting.  Genitourinary:  Positive for hematuria (intermittent) and penile pain.    Physical Exam Updated Vital Signs BP (!) 141/89 (BP Location: Right Arm)   Pulse 81   Temp 98.7 F (37.1 C) (Oral)   Resp 18   SpO2 99%  Physical Exam Vitals and nursing note reviewed.  Constitutional:      General: He is not in acute distress.    Appearance: He is well-developed.  HENT:     Head: Normocephalic and atraumatic.  Eyes:     Conjunctiva/sclera: Conjunctivae normal.  Cardiovascular:     Rate and Rhythm: Normal rate and regular rhythm.     Heart sounds: No murmur heard. Pulmonary:     Effort: Pulmonary effort is normal. No respiratory distress.     Breath sounds: Normal breath sounds.  Abdominal:     Palpations: Abdomen is soft.     Tenderness: There is abdominal tenderness in the suprapubic area.  Genitourinary:    Comments: Uncircumcised penis.  Foley catheter in place.  There are some blood around the glans.  Testicles nontender. Musculoskeletal:        General: No swelling.     Cervical back: Neck supple.  Skin:    General: Skin is warm and dry.     Capillary Refill: Capillary refill takes less than 2 seconds.  Neurological:     Mental Status: He is alert.  Psychiatric:        Mood and Affect: Mood normal.     ED Results / Procedures / Treatments   Labs (all labs ordered are listed, but only abnormal results are displayed) Labs Reviewed  BASIC METABOLIC PANEL - Abnormal; Notable for the following components:      Result Value   Glucose, Bld 106 (*)    Calcium 8.8 (*)    All other components within normal limits  CBC WITH DIFFERENTIAL/PLATELET - Abnormal; Notable for the following components:   RBC 3.75 (*)    Hemoglobin 11.1 (*)    HCT 35.7 (*)    All other components within normal limits  URINALYSIS, ROUTINE W REFLEX MICROSCOPIC - Abnormal; Notable for the following components:   APPearance CLOUDY (*)    Protein, ur 100 (*)    Leukocytes,Ua  LARGE (*)    Bacteria, UA MANY (*)    All other components within normal limits    EKG None  Radiology CT Renal Stone Study  Result Date: 02/15/2022 CLINICAL DATA:  Abdominal pain.  Concern for kidney stone. EXAM: CT ABDOMEN AND PELVIS WITHOUT CONTRAST TECHNIQUE: Multidetector CT imaging of the abdomen and pelvis was performed following the standard protocol without IV contrast. RADIATION DOSE REDUCTION: This exam was performed according to the departmental dose-optimization program which includes automated exposure control, adjustment of the mA and/or kV according to patient  size and/or use of iterative reconstruction technique. COMPARISON:  CT abdomen pelvis dated 06/06/2020. FINDINGS: Evaluation of this exam is limited in the absence of intravenous contrast. Lower chest: The visualized lung bases are clear. There is cardiomegaly with 3 vessel coronary vascular calcification. No intra-abdominal free air or free fluid. Hepatobiliary: Similar appearance of heterogeneous exophytic hepatic masses with rim calcification measuring up to 7.2 cm in diameter in the right lobe of the liver. No biliary ductal dilatation. The gallbladder is unremarkable Pancreas: Unremarkable. No pancreatic ductal dilatation or surrounding inflammatory changes. Spleen: Normal in size without focal abnormality. Adrenals/Urinary Tract: The adrenal glands are unremarkable. There is a 1 cm linear nonobstructing left renal inferior pole calculus. Punctate nonobstructing calculus versus vascular calcification of the inferior pole of the right kidney. There is no hydronephrosis on either side. Bilateral renal cysts. The visualized ureters appear unremarkable. Diffusely thickened and trabeculated appearance of the bladder wall likely related to chronic bladder outlet obstruction. Correlation with urinalysis recommended to exclude cystitis. The urinary bladder is minimally distended. A Foley catheter is noted with balloon in the bladder.  Several subcentimeter bladder calculi versus bladder wall calcification. Stomach/Bowel: There is a small hiatal hernia. There is extensive colonic diverticulosis without active inflammatory changes. There is no bowel obstruction or active inflammation. The appendix is normal. Vascular/Lymphatic: Advanced aortoiliac atherosclerotic disease. There is a 4.4 cm infrarenal abdominal aortic aneurysm (previously 3.8 cm). The IVC is unremarkable. No portal venous gas. There is no adenopathy. Reproductive: Mildly enlarged prostate gland measuring 5.5 cm in transverse axial diameter. Other: None Musculoskeletal: Osteopenia with degenerative changes of the spine. No acute osseous pathology. IMPRESSION: 1. Vascular calcification versus nonobstructing bilateral renal calculi. No hydronephrosis. 2. Diffusely thickened and trabeculated appearance of the bladder wall likely related to chronic bladder outlet obstruction. Correlation with urinalysis recommended to exclude cystitis. 3. Colonic diverticulosis.  No bowel obstruction. Normal appendix. 4. A 4.4 cm infrarenal abdominal aortic aneurysm (previously 3.8 cm). Recommend follow-up every 12 months and vascular consultation. This recommendation follows ACR consensus guidelines: White Paper of the ACR Incidental Findings Committee II on Vascular Findings. J Am Coll Radiol 2013; 29:476-546. 5.  Aortic Atherosclerosis (ICD10-I70.0). Electronically Signed   By: Anner Crete M.D.   On: 02/15/2022 21:58    Procedures Procedures    Medications Ordered in ED Medications  sodium chloride 0.9 % bolus 500 mL (0 mLs Intravenous Stopped 02/15/22 2055)  cefTRIAXone (ROCEPHIN) 1 g in sodium chloride 0.9 % 100 mL IVPB (0 g Intravenous Stopped 02/15/22 2252)    ED Course/ Medical Decision Making/ A&P Clinical Course as of 02/16/22 0951  Thu Feb 15, 2022  2031 Bladder scan without significant retention. [MB]  2151 Patient given IV fluids and having good output from his urinary  catheter.  Renal function is normal with normal white count.  Urinalysis showing infection although patient has chronic catheter so probably colonized [MB]    Clinical Course User Index [MB] Hayden Rasmussen, MD                             Medical Decision Making Amount and/or Complexity of Data Reviewed Labs: ordered. Radiology: ordered.  Risk Prescription drug management.   This patient complains of bladder and penis pain; this involves an extensive number of treatment Options and is a complaint that carries with it a high risk of complications and morbidity. The differential includes urinary retention, Foley irritation, bladder spasms, malposition Foley, UTI  I ordered, reviewed and interpreted labs, which included CBC with normal white count, hemoglobin low stable from priors, chemistries with normal renal function, urinalysis possible signs of infection I ordered medication IV fluids IV antibiotics oral bladder spasm medication and reviewed PMP when indicated. I ordered imaging studies which included CT renal and I independently    visualized and interpreted imaging which showed thickened bladder, no evidence of obstructive stones, slightly enlarged infrarenal AAA Additional history obtained from patient's daughter Previous records obtained and reviewed in epic including prior ED and urology notes with similar presentations Cardiac monitoring reviewed, sinus with frequent PVCs Social determinants considered, no significant barriers Critical Interventions: None  After the interventions stated above, I reevaluated the patient and found patient's pain to be improved. Admission and further testing considered, Foley appears to be working well no evidence of obstruction.  He is otherwise reassuring and his exam and lab work does not show any definite need for admission.  Will cover with antibiotics for possible infection.  Will also prescribe medication for bladder spasms.  Recommended  close follow-up with primary care and urology team.  Return instructions discussed         Final Clinical Impression(s) / ED Diagnoses Final diagnoses:  Bladder spasm  Cystitis  Abdominal aortic aneurysm (AAA) without rupture, unspecified part ([place])    Rx / DC Orders ED Discharge Orders          Ordered    cephALEXin (KEFLEX) 500 MG capsule  2 times daily        02/15/22 2206    mirabegron ER (MYRBETRIQ) 25 MG TB24 tablet  Daily        02/15/22 2207              Hayden Rasmussen, MD 02/16/22 [phone]

## 2022-02-15 NOTE — Telephone Encounter (Signed)
        Patient  visited [place] on 1/21    Telephone encounter attempt :1st    A HIPAA compliant voice message was left requesting a return call.  Instructed patient to call back .    Kimberli Winne [facility] Guide, [place], Care Management  [phone] [address], [place], [place] [address] Phone: [phone] Email: [email or web address]       

## 2022-02-15 NOTE — ED Notes (Signed)
Patient transported to CT 

## 2022-02-15 NOTE — ED Triage Notes (Signed)
Pt with pain to penis.  Pt with foley catheter and was changed on Sunday here and  changed again at urology office on Monday. Family reports that sitter has emptied urine from bag.

## 2022-02-16 ENCOUNTER — Telehealth: Payer: Self-pay

## 2022-02-16 ENCOUNTER — Ambulatory Visit (HOSPITAL_COMMUNITY)
Admission: RE | Admit: 2022-02-16 | Discharge: 2022-02-16 | Disposition: A | Discharge status: Home / Self Care | Payer: Medicare PPO | Source: Ambulatory Visit | Attending: Student | Admitting: Student

## 2022-02-16 DIAGNOSIS — I6523 Occlusion and stenosis of bilateral carotid arteries: Secondary | ICD-10-CM | POA: Insufficient documentation

## 2022-02-16 NOTE — Telephone Encounter (Signed)
Transition Care Management Follow-up Telephone Call Date of discharge and from where: 02/15/22 [facility] How have you been since you were released from the hospital? Spoke with daughter/Cindy:who sts pt is resting comfortably and no longer has foley blockage. Any questions or concerns? No  Items Reviewed: Did the pt receive and understand the discharge instructions provided? Yes son & daughter received Medications obtained and verified? Yes  Other? No  Any new allergies since your discharge? No  Dietary orders reviewed? No Do you have support at home? Yes lives at [employer] and sons and daughter/Cindy help manage care  Functional Questionnaire: (I = Independent and D = Dependent) ADLs: I/D  Bathing/Dressing- I/D  Meal Prep- D  Eating- I  Maintaining continence- D  Transferring/Ambulation- D roller walker  Managing Meds- D  Follow up appointments reviewed:  PCP Hospital f/u appt confirmed? No  daughter/Cindy sts they are f/u with Urologist after ABT: she feels appt w/PCP not indicated. Last saw Urologist on 02/12/22. [facility] f/u appt confirmed? No daughter will schedule Are transportation arrangements needed? No  If their condition worsens, is the pt aware to call PCP or go to the Emergency Dept.? Yes Was the patient provided with contact information for the PCP's office or ED? Yes Was to pt encouraged to call back with questions or concerns? Yes

## 2022-02-16 NOTE — Telephone Encounter (Signed)
     Patient  visit on 1/21  at [facility]   Have you been able to follow up with your primary care physician? Yes   The patient was or was not able to obtain any needed medicine or equipment. Yes   Are there diet recommendations that you are having difficulty following? Na   Patient expresses understanding of discharge instructions and education provided has no other needs at this time.   Yes     Tristan Lopez [facility] Care Guide, [place], Care Management  [phone] [address], [place], [place] [address] Phone: [phone] Email: [email or web address]    

## 2022-02-16 NOTE — Telephone Encounter (Signed)
Transition Care Management Follow-up Telephone Call Date of discharge and from where: [facility] 02/15/2022 How have you been since you were released from the hospital? better Any questions or concerns? No  Items Reviewed: Did the pt receive and understand the discharge instructions provided? Yes  Medications obtained and verified? Yes  Other? No  Any new allergies since your discharge? No  Dietary orders reviewed? Yes Do you have support at home? Yes   Home Care and Equipment/Supplies: Were home health services ordered? no If so, what is the name of the agency? N/a  Has the agency set up a time to come to the patient's home? no Were any new equipment or medical supplies ordered?  No What is the name of the medical supply agency? N/a Were you able to get the supplies/equipment? not applicable Do you have any questions related to the use of the equipment or supplies? No  Functional Questionnaire: (I = Independent and D = Dependent) ADLs: i  Bathing/Dressing- i  Meal Prep- i  Eating- i  Maintaining continence- i  Transferring/Ambulation- i  Managing Meds- i  Follow up appointments reviewed:  PCP Hospital f/u appt confirmed? No   [facility]? Patient's daughter will schedule with Dr Lynden Oxford Are transportation arrangements needed? No  If their condition worsens, is the pt aware to call PCP or go to the Emergency Dept.? Yes Was the patient provided with contact information for the PCP's office or ED? Yes Was to pt encouraged to call back with questions or concerns? Yes Juanda Crumble, LPN [place] [facility] Dial [phone]

## 2022-02-26 ENCOUNTER — Telehealth: Payer: Self-pay

## 2022-02-26 MED ORDER — MIRABEGRON ER 25 MG PO TB24: 25.0000 mg | ORAL_TABLET | Freq: Every day | ORAL | 11 refills | Status: DC

## 2022-02-26 NOTE — Addendum Note (Signed)
Addended by: Audie Box on: 02/26/2022 03:47 PM   Modules accepted: Orders

## 2022-02-26 NOTE — Telephone Encounter (Signed)
Daughter states that Myrbtetriq rx is helping pt's bladder spasms.  Patient still has samples, rx sent to pharmacy and daughter aware.

## 2022-02-26 NOTE — Telephone Encounter (Addendum)
Returned call with no answer.  Left VM requesting a call back.

## 2022-02-26 NOTE — Telephone Encounter (Signed)
Tristan Lopez - needing to know if patient needs to continue taking the spasms medication - mirabegron ER (MYRBETRIQ) 25 MG TB24 tablet.  Please call Tristan Lopez at [phone].

## 2022-03-03 ENCOUNTER — Other Ambulatory Visit: Payer: Self-pay | Admitting: Student

## 2022-03-03 ENCOUNTER — Other Ambulatory Visit: Payer: Self-pay | Admitting: Internal Medicine

## 2022-03-15 ENCOUNTER — Ambulatory Visit (INDEPENDENT_AMBULATORY_CARE_PROVIDER_SITE_OTHER): Payer: Medicare PPO | Admitting: Urology

## 2022-03-15 DIAGNOSIS — R339 Retention of urine, unspecified: Secondary | ICD-10-CM | POA: Diagnosis not present

## 2022-03-15 NOTE — Progress Notes (Signed)
Cath Change/ Replacement  Patient is present today for a catheter change due to urinary retention.  15 ml of water was removed from the balloon, a 18 FR foley cath was removed without difficulty.  Patient was cleaned and prepped in a sterile fashion . A 18 FR foley cath was replaced into the bladder, no complications were noted. Urine return was noted 0 ml. Patient was flushed with 20 ml of sterile water and returned. The balloon was filled with 20 ml of sterile water. A leg bag was attached for drainage.  A night bag was also given to the patient and patient was given instruction on how to change from one bag to another. Patient was given proper instruction on catheter care.    Performed by: Marisue Brooklyn, CMA  Follow up: Keep next NV

## 2022-03-20 ENCOUNTER — Other Ambulatory Visit: Payer: Self-pay | Admitting: Student

## 2022-03-20 NOTE — Telephone Encounter (Signed)
Refill completed- Patient has first available appt with Dr. Domenic Polite 3/12.

## 2022-03-22 ENCOUNTER — Other Ambulatory Visit: Payer: Self-pay | Admitting: Cardiology

## 2022-03-23 LAB — PROTIME-INR
INR: 2.1 — ABNORMAL HIGH (ref 0.9–1.2)
Prothrombin Time: 21.4 s — ABNORMAL HIGH (ref 9.1–12.0)

## 2022-03-27 ENCOUNTER — Ambulatory Visit (INDEPENDENT_AMBULATORY_CARE_PROVIDER_SITE_OTHER): Payer: Medicare PPO

## 2022-03-27 ENCOUNTER — Ambulatory Visit (INDEPENDENT_AMBULATORY_CARE_PROVIDER_SITE_OTHER): Payer: Medicare PPO | Admitting: Internal Medicine

## 2022-03-27 ENCOUNTER — Encounter: Payer: Self-pay | Admitting: Internal Medicine

## 2022-03-27 VITALS — BP 133/66 | HR 84 | Ht 68.0 in | Wt 229.4 lb

## 2022-03-27 DIAGNOSIS — K219 Gastro-esophageal reflux disease without esophagitis: Secondary | ICD-10-CM | POA: Diagnosis not present

## 2022-03-27 DIAGNOSIS — Z0001 Encounter for general adult medical examination with abnormal findings: Secondary | ICD-10-CM | POA: Insufficient documentation

## 2022-03-27 DIAGNOSIS — I10 Essential (primary) hypertension: Secondary | ICD-10-CM

## 2022-03-27 DIAGNOSIS — E611 Iron deficiency: Secondary | ICD-10-CM

## 2022-03-27 DIAGNOSIS — E876 Hypokalemia: Secondary | ICD-10-CM

## 2022-03-27 DIAGNOSIS — Z0000 Encounter for general adult medical examination without abnormal findings: Secondary | ICD-10-CM

## 2022-03-27 DIAGNOSIS — Z1329 Encounter for screening for other suspected endocrine disorder: Secondary | ICD-10-CM

## 2022-03-27 DIAGNOSIS — Z1321 Encounter for screening for nutritional disorder: Secondary | ICD-10-CM

## 2022-03-27 MED ORDER — POTASSIUM CITRATE ER 10 MEQ (1080 MG) PO TBCR: 10.0000 meq | EXTENDED_RELEASE_TABLET | Freq: Every day | ORAL | 4 refills | Status: DC

## 2022-03-27 NOTE — Progress Notes (Signed)
Subjective:   Tristan Lopez is a 89 y.o. male who presents for Medicare Annual/Subsequent preventive examination.  I connected with  Tristan Lopez on 03/27/22 by a audio enabled telemedicine application and verified that I am speaking with the correct person using two identifiers.  Patient Location: Home  Provider Location: Office/Clinic  I discussed the limitations of evaluation and management by telemedicine. The patient expressed understanding and agreed to proceed.   Review of Systems     [name] , Thank you for taking time to come for your Medicare Wellness Visit. I appreciate your ongoing commitment to your health goals. Please review the following plan we discussed and let me know if I can assist you in the future.   These are the goals we discussed:  Goals      DIET - EAT MORE FRUITS AND VEGETABLES     Try to eat a light healthy dinner daily.        This is a list of the screening recommended for you and due dates:  Health Maintenance  Topic Date Due   COVID-19 Vaccine (3 - Moderna risk series) 12/07/2021   Medicare Annual Wellness Visit  03/27/2023   DTaP/Tdap/Td vaccine (3 - Tdap) 05/12/2030   Pneumonia Vaccine  Completed   Flu Shot  Completed   Zoster (Shingles) Vaccine  Completed   HPV Vaccine  Aged Out          Objective:    There were no vitals filed for this visit. There is no height or weight on file to calculate BMI.     02/11/2022    3:21 PM 08/30/2021    6:13 PM 08/24/2021    2:36 PM 06/22/2021   12:48 AM 05/01/2021    4:51 PM 04/06/2021    5:42 PM 03/10/2021    9:20 AM  Advanced Directives  Does Patient Have a Medical Advance Directive? No No Yes No Yes Yes No  Type of Scientist, physiological of [place];Living will  Living will;Out of facility DNR (pink MOST or yellow form) Living will   Does patient want to make changes to medical advance directive?  No - Patient declined No - Patient declined      Copy of [place] in Chart?   No - copy requested      Would patient like information on creating a medical advance directive?     Yes (ED - Information included in AVS) No - Patient declined No - Patient declined    Current Medications (verified) Outpatient Encounter Medications as of 03/27/2022  Medication Sig   amLODipine (NORVASC) 5 MG tablet Take 1 tablet (5 mg total) by mouth daily.   Ascorbic Acid (VITAMIN C) 1000 MG tablet Take 1,000 mg by mouth daily.   benazepril (LOTENSIN) 40 MG tablet TAKE 1 TABLET (40 MG TOTAL) BY MOUTH DAILY.   Cholecalciferol (VITAMIN D-3) 1000 units CAPS Take 2,000 Units by mouth daily.   FEROSUL 325 (65 Fe) MG tablet TAKE ONE TABLET BY MOUTH EVERY DAY   furosemide (LASIX) 20 MG tablet TAKE 1 TABLET (20 MG TOTAL) BY MOUTH DAILY. TAKE 20 MG DAILY AS NEEDED FOR LEG SWELLING   Garlic 123XX123 MG CAPS Take 1,000 mg by mouth daily.   mirabegron ER (MYRBETRIQ) 25 MG TB24 tablet Take 1 tablet (25 mg total) by mouth daily.   pantoprazole (PROTONIX) 40 MG tablet TAKE ONE TABLET BY MOUTH EVERY DAY   potassium citrate (UROCIT-K)  10 MEQ (1080 MG) SR tablet Take 1 tablet (10 mEq total) by mouth daily.   Probiotic Product (DIGESTIVE ADVANTAGE PO) Take by mouth 2 (two) times daily.    propranolol (INDERAL) 20 MG tablet TAKE 1 TABLET (20 MG TOTAL) BY MOUTH DAILY.   rosuvastatin (CRESTOR) 10 MG tablet TAKE 1 TABLET (10 MG TOTAL) BY MOUTH DAILY.   tadalafil (CIALIS) 5 MG tablet TAKE ONE TABLET BY MOUTH EVERY DAY   warfarin (COUMADIN) 5 MG tablet TAKE 1 TABLET (5 MG TOTAL) BY MOUTH DAILY. (Patient taking differently: Take 5 mg by mouth See admin instructions. Take 2.5 mg on Thursday and 'Sunday all other days take 5 mg.)   No facility-administered encounter medications on file as of 03/27/2022.    Allergies (verified) Latex   History: Past Medical History:  Diagnosis Date   Abdominal aortic aneurysm without rupture (HCC)    Bilateral renal cysts    BPH (benign prostatic  hyperplasia)    Carotid artery occlusion    Chronic atrial fibrillation (HCC)    COPD (chronic obstructive pulmonary disease) (HCC)    Diverticulosis    Pancolonic   Essential hypertension    Hemorrhoids    Hiatal hernia    Hx of adenomatous colonic polyps    Hypercholesteremia    Liver cyst    Benign by liver biopsy September 2013   Nephrolithiasis    Nephrolithiasis    Permanent atrial fibrillation (HCC)    Pre-diabetes    Reflux esophagitis    Sleep apnea    Uses CPAP   Tubular adenoma    Past Surgical History:  Procedure Laterality Date   COLONOSCOPY     20'$ 03, no polyps per patient. Dr. West Carbo   COLONOSCOPY  11/01/11   Dr. Gala Romney- tubular adenoma,suboptimal preparation, internal hemorrhoids, o/w normal rectum. pancolonic diverticulosis   COLONOSCOPY WITH PROPOFOL N/A 11/29/2020   Procedure: COLONOSCOPY WITH PROPOFOL;  Surgeon: Harvel Quale, MD;  Location: [facility];  Service: Gastroenterology;  Laterality: N/A;   ESOPHAGOGASTRODUODENOSCOPY  11/01/11   Dr. Gala Romney- erosive reflux esophagitis along with a patulous EG junction, hialtal hernia, antral erosions with possible area of healing ulceration- s/p bx= chronic erosive gastritis, no malignancy   ESOPHAGOGASTRODUODENOSCOPY (EGD) WITH PROPOFOL N/A 11/29/2020   Procedure: ESOPHAGOGASTRODUODENOSCOPY (EGD) WITH PROPOFOL;  Surgeon: Harvel Quale, MD;  Location: [facility];  Service: Gastroenterology;  Laterality: N/A;   HERNIA REPAIR     x2, bilateral inguinal    KIDNEY STONE SURGERY     KNEE ARTHROSCOPY     X2   NASAL ENDOSCOPY     Family History  Problem Relation Age of Onset   Breast cancer Mother    Other Mother        Essential tremor   Stroke Mother    Heart disease Father        Pacemaker   Heart attack Maternal Grandfather    Renal cancer Son    Colon cancer Neg Hx    Liver disease Neg Hx    Prostate cancer Neg Hx    Bladder Cancer Neg Hx    Kidney cancer Neg Hx    Social  History   Socioeconomic History   Marital status: Married    Spouse name: Not on file   Number of children: 3   Years of education: Not on file   Highest education level: Not on file  Occupational History    Employer: RETIRED   Occupation: Retired- Radiation protection practitioner and farming  Tobacco Use   Smoking status: Former    Packs/day: 0.50    Types: Cigarettes    Quit date: 1992    Years since quitting: 32.1   Smokeless tobacco: Never  Vaping Use   Vaping Use: Never used  Substance and Sexual Activity   Alcohol use: No   Drug use: No   Sexual activity: Not Currently  Other Topics Concern   Not on file  Social History Narrative   Married for 68 years.Retired.No longer in Assisted Living, he came home after wife went to memory care. [place] for memory care as of Dec 2021.   Social Determinants of Health   Financial Resource Strain: Not on file  Food Insecurity: Not on file  Transportation Needs: Not on file  Physical Activity: Not on file  Stress: Not on file  Social Connections: Not on file    Tobacco Counseling Counseling given: Not Answered   Clinical Intake:                 Diabetic?No          Activities of Daily Living     No data to display           Patient Care Team: Johnette Abraham, MD as PCP - General (Internal Medicine) Satira Sark, MD as PCP - Cardiology (Cardiology) Gala Romney Cristopher Estimable, MD (Gastroenterology)  Indicate any recent Medical Services you may have received from other than [facility] providers in the past year (date may be approximate).     Assessment:   This is a routine wellness examination for [place].  Hearing/Vision screen No results found.  Dietary issues and exercise activities discussed:     Goals Addressed   None   Depression Screen    03/27/2022    1:12 PM 12/19/2021    4:27 PM 09/28/2021    3:41 PM 05/23/2021    3:23 PM 03/13/2021    3:27 PM 01/06/2021   10:31 AM 10/18/2020     1:11 PM  PHQ 2/9 Scores  PHQ - 2 Score 0 0 0 0 0 0 0  PHQ- 9 Score 0 0         Fall Risk    03/27/2022    1:12 PM 12/19/2021    4:26 PM 09/28/2021    3:41 PM 05/23/2021    3:23 PM 03/13/2021    3:26 PM  [place] in the past year? 0 0 0 0 0  Number falls in past yr: 0 0 0 0 0  Injury with Fall? 0 0 0 0 0  Risk for fall due to : No Fall Risks No Fall Risks No Fall Risks No Fall Risks No Fall Risks  Follow up Falls evaluation completed Falls evaluation completed Falls evaluation completed Falls evaluation completed Falls evaluation completed    FALL RISK PREVENTION PERTAINING TO THE HOME:  Any stairs in or around the home? Yes  If so, are there any without handrails? No  Home free of loose throw rugs in walkways, pet beds, electrical cords, etc? Yes  Adequate lighting in your home to reduce risk of falls? Yes   ASSISTIVE DEVICES UTILIZED TO PREVENT FALLS:  Life alert? No  Use of a cane, walker or w/c? Yes  Grab bars in the bathroom? Yes  Shower chair or bench in shower? Yes  Elevated toilet seat or a handicapped toilet? Yes    Cognitive Function:  03/17/2020    5:08 PM 02/10/2019   11:42 AM  6CIT Screen  What Year? 0 points 0 points  What month? 0 points 0 points  What time? 0 points 0 points  Count back from 20 0 points 0 points  Months in reverse 2 points 0 points  Repeat phrase 6 points 8 points  Total Score 8 points 8 points    Immunizations Immunization History  Administered Date(s) Administered   DTaP 12/10/2016, 05/11/2020   Fluad Quad(high Dose 65+) 11/26/2018, 10/18/2020, 09/28/2021   Influenza,inj,Quad PF,6+ Mos 11/23/2018   Influenza-Unspecified 10/29/2014, 09/23/2019   Moderna SARS-COV2 Booster Vaccination 04/28/2020, 11/09/2021   Moderna Sars-Covid-2 Vaccination 02/21/2019, 03/12/2019   Pneumococcal Conjugate-13 10/29/2014, 12/26/2015   Pneumococcal Polysaccharide-23 12/16/2015   Pneumococcal-Unspecified 10/29/2014   Zoster Recombinat  (Shingrix) 08/21/2017, 11/12/2017   Zoster, Live 08/20/2017    TDAP status: Up to date  Flu Vaccine status: Up to date  Pneumococcal vaccine status: Up to date  Covid-19 vaccine status: Completed vaccines  Qualifies for Shingles Vaccine? Yes   Zostavax completed Yes   Shingrix Completed?: Yes  Screening Tests Health Maintenance  Topic Date Due   COVID-19 Vaccine (3 - Moderna risk series) 12/07/2021   Medicare Annual Wellness (AWV)  03/27/2023   DTaP/Tdap/Td (3 - Tdap) 05/12/2030   Pneumonia Vaccine 23+ Years old  Completed   INFLUENZA VACCINE  Completed   Zoster Vaccines- Shingrix  Completed   HPV VACCINES  Aged Out    Health Maintenance  Health Maintenance Due  Topic Date Due   COVID-19 Vaccine (3 - Moderna risk series) 12/07/2021    Colorectal cancer screening: No longer required.   Lung Cancer Screening: (Low Dose CT Chest recommended if Age 22-80 years, 30 pack-year currently smoking OR have quit w/in 15years.) does not qualify.   Lung Cancer Screening Referral:   Additional Screening:  Hepatitis C Screening: does qualify; Completed   Vision Screening: Recommended annual ophthalmology exams for early detection of glaucoma and other disorders of the eye. Is the patient up to date with their annual eye exam?  Yes  Who is the provider or what is the name of the office in which the patient attends annual eye exams? Wasn't sure   Dental Screening: Recommended annual dental exams for proper oral hygiene  Community Resource Referral / Chronic Care Management: CRR required this visit?  No   CCM required this visit?  No      Plan:     I have personally reviewed and noted the following in the patient's chart:   Medical and social history Use of alcohol, tobacco or illicit drugs  Current medications and supplements including opioid prescriptions. Patient is not currently taking opioid prescriptions. Functional ability and status Nutritional  status Physical activity Advanced directives List of other physicians Hospitalizations, surgeries, and ER visits in previous 12 months Vitals Screenings to include cognitive, depression, and falls Referrals and appointments  In addition, I have reviewed and discussed with patient certain preventive protocols, quality metrics, and best practice recommendations. A written personalized care plan for preventive services as well as general preventive health recommendations were provided to patient.     Johny Drilling, [place]   03/27/2022   Nurse Notes:  Mr. Keaveney , Thank you for taking time to come for your Medicare Wellness Visit. I appreciate your ongoing commitment to your health goals. Please review the following plan we discussed and let me know if I can assist you in the future.   These are  the goals we discussed:  Goals      DIET - EAT MORE FRUITS AND VEGETABLES     Try to eat a light healthy dinner daily.        This is a list of the screening recommended for you and due dates:  Health Maintenance  Topic Date Due   COVID-19 Vaccine (3 - Moderna risk series) 12/07/2021   Medicare Annual Wellness Visit  03/27/2023   DTaP/Tdap/Td vaccine (3 - Tdap) 05/12/2030   Pneumonia Vaccine  Completed   Flu Shot  Completed   Zoster (Shingles) Vaccine  Completed   HPV Vaccine  Aged Out

## 2022-03-27 NOTE — Progress Notes (Signed)
Complete physical exam  Patient: Tristan Lopez   DOB: 09-Jul-1930   89 y.o. Male  MRN: [id number]  Subjective:    Chief Complaint  Patient presents with   Annual Exam    Tristan Lopez is a 89 y.o. male who presents today for a complete physical exam. He reports consuming a general diet. The patient does not participate in regular exercise at present. He generally feels well. He reports sleeping fairly well. He does not have additional problems to discuss today.    Most recent fall risk assessment:    03/27/2022    1:12 PM  Fall Risk   Falls in the past year? 0  Number falls in past yr: 0  Injury with Fall? 0  Risk for fall due to : No Fall Risks  Follow up Falls evaluation completed     Most recent depression screenings:    03/27/2022    1:12 PM 12/19/2021    4:27 PM  PHQ 2/9 Scores  PHQ - 2 Score 0 0  PHQ- 9 Score 0 0    Vision:Not within last year  and Dental: No current dental problems and Receives regular dental care  Past Medical History:  Diagnosis Date   Abdominal aortic aneurysm without rupture (HCC)    Bilateral renal cysts    BPH (benign prostatic hyperplasia)    Carotid artery occlusion    Chronic atrial fibrillation (HCC)    COPD (chronic obstructive pulmonary disease) ([place])    Diverticulosis    Pancolonic   Essential hypertension    Hemorrhoids    Hiatal hernia    Hx of adenomatous colonic polyps    Hypercholesteremia    Liver cyst    Benign by liver biopsy September 2013   Nephrolithiasis    Nephrolithiasis    Permanent atrial fibrillation ([place])    Pre-diabetes    Reflux esophagitis    Sleep apnea    Uses CPAP   Tubular adenoma    Past Surgical History:  Procedure Laterality Date   COLONOSCOPY     2003, no polyps per patient. Dr. West Carbo   COLONOSCOPY  11/01/11   Dr. Gala Romney- tubular adenoma,suboptimal preparation, internal hemorrhoids, o/w normal rectum. pancolonic diverticulosis   COLONOSCOPY WITH PROPOFOL N/A 11/29/2020    Procedure: COLONOSCOPY WITH PROPOFOL;  Surgeon: Harvel Quale, MD;  [facility];  Service: Gastroenterology;  Laterality: N/A;   ESOPHAGOGASTRODUODENOSCOPY  11/01/11   Dr. Gala Romney- erosive reflux esophagitis along with a patulous EG junction, hialtal hernia, antral erosions with possible area of healing ulceration- s/p bx= chronic erosive gastritis, no malignancy   ESOPHAGOGASTRODUODENOSCOPY (EGD) WITH PROPOFOL N/A 11/29/2020   Procedure: ESOPHAGOGASTRODUODENOSCOPY (EGD) WITH PROPOFOL;  Surgeon: Harvel Quale, MD;  Location: [facility];  Service: Gastroenterology;  Laterality: N/A;   HERNIA REPAIR     x2, bilateral inguinal    KIDNEY STONE SURGERY     KNEE ARTHROSCOPY     X2   NASAL ENDOSCOPY     Social History   Tobacco Use   Smoking status: Former    Packs/day: 0.50    Types: Cigarettes    Quit date: 1992    Years since quitting: 32.1   Smokeless tobacco: Never  Vaping Use   Vaping Use: Never used  Substance Use Topics   Alcohol use: No   Drug use: No   Family History  Problem Relation Age of Onset   Breast cancer Mother    Other Mother  Essential tremor   Stroke Mother    Heart disease Father        Pacemaker   Heart attack Maternal Grandfather    Renal cancer Son    Colon cancer Neg Hx    Liver disease Neg Hx    Prostate cancer Neg Hx    Bladder Cancer Neg Hx    Kidney cancer Neg Hx    Allergies  Allergen Reactions   Latex Rash   Patient Care Team: Johnette Abraham, MD as PCP - General (Internal Medicine) Satira Sark, MD as PCP - Cardiology (Cardiology) Daneil Dolin, MD (Gastroenterology)   Outpatient Medications Prior to Visit  Medication Sig   amLODipine (NORVASC) 5 MG tablet Take 1 tablet (5 mg total) by mouth daily.   Ascorbic Acid (VITAMIN C) 1000 MG tablet Take 1,000 mg by mouth daily.   benazepril (LOTENSIN) 40 MG tablet TAKE 1 TABLET (40 MG TOTAL) BY MOUTH DAILY.   Cholecalciferol (VITAMIN D-3)  1000 units CAPS Take 2,000 Units by mouth daily.   FEROSUL 325 (65 Fe) MG tablet TAKE ONE TABLET BY MOUTH EVERY DAY   furosemide (LASIX) 20 MG tablet TAKE 1 TABLET (20 MG TOTAL) BY MOUTH DAILY. TAKE 20 MG DAILY AS NEEDED FOR LEG SWELLING   Garlic 123XX123 MG CAPS Take 1,000 mg by mouth daily.   mirabegron ER (MYRBETRIQ) 25 MG TB24 tablet Take 1 tablet (25 mg total) by mouth daily.   pantoprazole (PROTONIX) 40 MG tablet TAKE ONE TABLET BY MOUTH EVERY DAY   Probiotic Product (DIGESTIVE ADVANTAGE PO) Take by mouth 2 (two) times daily.    propranolol (INDERAL) 20 MG tablet TAKE 1 TABLET (20 MG TOTAL) BY MOUTH DAILY.   rosuvastatin (CRESTOR) 10 MG tablet TAKE 1 TABLET (10 MG TOTAL) BY MOUTH DAILY.   tadalafil (CIALIS) 5 MG tablet TAKE ONE TABLET BY MOUTH EVERY DAY   warfarin (COUMADIN) 5 MG tablet TAKE 1 TABLET (5 MG TOTAL) BY MOUTH DAILY. (Patient taking differently: Take 5 mg by mouth See admin instructions. Take 2.5 mg on Thursday and Sunday all other days take 5 mg.)   [DISCONTINUED] potassium chloride (KLOR-CON) 10 MEQ tablet TAKE 1 TABLET (10 MEQ TOTAL) BY MOUTH DAILY. (Patient taking differently: Take 10 mEq by mouth daily. Take when Furosemide is taken)   [DISCONTINUED] potassium citrate (UROCIT-K) 10 MEQ (1080 MG) SR tablet TAKE 1 TABLET (10 MEQ TOTAL) BY MOUTH DAILY.   [DISCONTINUED] cephALEXin (KEFLEX) 500 MG capsule Take 1 capsule (500 mg total) by mouth 2 (two) times daily.   [DISCONTINUED] clotrimazole (LOTRIMIN) 1 % cream Apply 1 Application topically 2 (two) times daily. (Patient not taking: Reported on 02/15/2022)   [DISCONTINUED] clotrimazole-betamethasone (LOTRISONE) cream Apply 1 Application topically 2 (two) times daily.   [DISCONTINUED] finasteride (PROSCAR) 5 MG tablet Take 1 tablet (5 mg total) by mouth daily.   [DISCONTINUED] hydrocortisone (ANUSOL-HC) 25 MG suppository Place 1 suppository (25 mg total) rectally 2 (two) times daily. (Patient not taking: Reported on 02/15/2022)    [DISCONTINUED] mirabegron ER (MYRBETRIQ) 25 MG TB24 tablet Take 1 tablet (25 mg total) by mouth daily.   No facility-administered medications prior to visit.   Review of Systems  Constitutional:  Negative for chills and fever.  HENT:  Negative for sore throat.   Respiratory:  Negative for cough and shortness of breath.   Cardiovascular:  Negative for chest pain, palpitations and leg swelling.  Gastrointestinal:  Negative for abdominal pain, blood in stool, constipation, diarrhea, nausea and  vomiting.  Genitourinary:  Negative for dysuria and hematuria.  Musculoskeletal:  Negative for myalgias.  Skin:  Negative for itching and rash.  Neurological:  Negative for dizziness and headaches.  Psychiatric/Behavioral:  Negative for depression and suicidal ideas.       Objective:     BP 133/66   Pulse 84   Ht '5\' 8"'$  (1.727 m)   Wt 229 lb 6.4 oz (104.1 kg)   SpO2 96%   BMI 34.88 kg/m  BP Readings from Last 3 Encounters:  03/27/22 133/66  02/15/22 (!) 136/94  02/11/22 134/89   Physical Exam Vitals reviewed.  Constitutional:      Appearance: Normal appearance.  HENT:     Head: Normocephalic and atraumatic.     Right Ear: External ear normal.     Left Ear: External ear normal.     Nose: No congestion or rhinorrhea.     Mouth/Throat:     Mouth: Mucous membranes are moist.     Pharynx: Oropharynx is clear.  Eyes:     Extraocular Movements: Extraocular movements intact.     Pupils: Pupils are equal, round, and reactive to light.  Cardiovascular:     Rate and Rhythm: Normal rate. Rhythm irregular.     Pulses: Normal pulses.     Heart sounds: Murmur heard.  Pulmonary:     Effort: Pulmonary effort is normal.     Breath sounds: Normal breath sounds.  Abdominal:     General: Abdomen is flat. Bowel sounds are normal. There is no distension.     Palpations: Abdomen is soft.  Musculoskeletal:        General: Normal range of motion.     Right lower leg: Edema (trace) present.      Left lower leg: Edema (trace) present.  Skin:    General: Skin is warm and dry.     Capillary Refill: Capillary refill takes less than 2 seconds.     Coloration: Skin is not jaundiced.  Neurological:     General: No focal deficit present.     Mental Status: He is alert and oriented to person, place, and time.     Gait: Gait abnormal.     Comments: Ambulates with rollator   Last CBC Lab Results  Component Value Date   WBC 5.1 02/15/2022   HGB 11.1 (L) 02/15/2022   HCT 35.7 (L) 02/15/2022   MCV 95.2 02/15/2022   MCH 29.6 02/15/2022   RDW 14.1 02/15/2022   PLT 182 [id number]   Last metabolic panel Lab Results  Component Value Date   GLUCOSE 106 (H) 02/15/2022   NA 137 02/15/2022   K 3.6 02/15/2022   CL 103 02/15/2022   CO2 25 02/15/2022   BUN 23 02/15/2022   CREATININE 0.91 02/15/2022   GFRNONAA >60 02/15/2022   CALCIUM 8.8 (L) 02/15/2022   PHOS 2.5 11/29/2020   PROT 6.7 08/30/2021   ALBUMIN 3.4 (L) 08/30/2021   LABGLOB 2.2 01/06/2021   AGRATIO 1.9 01/06/2021   BILITOT 0.7 08/30/2021   ALKPHOS 49 08/30/2021   AST 16 08/30/2021   ALT 11 08/30/2021   ANIONGAP 9 02/15/2022   Last lipids Lab Results  Component Value Date   CHOL 134 10/04/2021   HDL 52 10/04/2021   LDLCALC 69 10/04/2021   TRIG 63 10/04/2021   CHOLHDL 2.6 10/04/2021   Last hemoglobin A1c Lab Results  Component Value Date   HGBA1C 5.2 01/06/2021   Last vitamin D Lab Results  Component Value Date  VD25OH 41 05/11/2020       Assessment & Plan:    Routine Health Maintenance and Physical Exam  Immunization History  Administered Date(s) Administered   DTaP 12/10/2016, 05/11/2020   Fluad Quad(high Dose 65+) 11/26/2018, 10/18/2020, 09/28/2021   Influenza,inj,Quad PF,6+ Mos 11/23/2018   Influenza-Unspecified 10/29/2014, 09/23/2019   Moderna SARS-COV2 Booster Vaccination 04/28/2020, 11/09/2021   Moderna Sars-Covid-2 Vaccination 02/21/2019, 03/12/2019   Pneumococcal Conjugate-13 10/29/2014,  12/26/2015   Pneumococcal Polysaccharide-23 12/16/2015   Pneumococcal-Unspecified 10/29/2014   Zoster Recombinat (Shingrix) 08/21/2017, 11/12/2017   Zoster, Live 08/20/2017    Health Maintenance  Topic Date Due   Medicare Annual Wellness (AWV)  03/17/2021   COVID-19 Vaccine (3 - Moderna risk series) 12/07/2021   DTaP/Tdap/Td (3 - Tdap) 05/12/2030   Pneumonia Vaccine 64+ Years old  Completed   INFLUENZA VACCINE  Completed   Zoster Vaccines- Shingrix  Completed   HPV VACCINES  Aged Out    Discussed health benefits of physical activity, and encouraged him to engage in regular exercise appropriate for his age and condition.  Problem List Items Addressed This Visit       Encounter for well adult exam with abnormal findings - Primary    Presenting today for his annual exam.  Interval records and labs have been reviewed. -Updated labs been ordered today -Vaccines are up-to-date.  I have recommended that he receive any outstanding COVID-19 booster vaccines at his pharmacy and notify our office. -Medicare AWV to be completed today -We will tentatively plan for follow-up in 6 months      Return in about 6 months (around 09/27/2022).  Johnette Abraham, MD

## 2022-03-27 NOTE — Assessment & Plan Note (Addendum)
Presenting today for his annual exam.  Interval records and labs have been reviewed. -Updated labs been ordered today -Vaccines are up-to-date.  I have recommended that he receive any outstanding COVID-19 booster vaccines at his pharmacy and notify our office. -Medicare AWV to be completed today -We will tentatively plan for follow-up in 6 months

## 2022-03-27 NOTE — Patient Instructions (Signed)
It was a pleasure to see you today.  Thank you for giving [place] the opportunity to be involved in your care.  Below is a brief recap of your visit and next steps.  We will plan to see you again in 6 months.  Summary We completed your annual exam today Repeat labs have been ordered We will plan for follow up in 6 months

## 2022-03-27 NOTE — Patient Instructions (Signed)
Health Maintenance, Male Adopting a healthy lifestyle and getting preventive care are important in promoting health and wellness. Ask your health care provider about: The right schedule for you to have regular tests and exams. Things you can do on your own to prevent diseases and keep yourself healthy. What should I know about diet, weight, and exercise? Eat a healthy diet  Eat a diet that includes plenty of vegetables, fruits, low-fat dairy products, and lean protein. Do not eat a lot of foods that are high in solid fats, added sugars, or sodium. Maintain a healthy weight Body mass index (BMI) is a measurement that can be used to identify possible weight problems. It estimates body fat based on height and weight. Your health care provider can help determine your BMI and help you achieve or maintain a healthy weight. Get regular exercise Get regular exercise. This is one of the most important things you can do for your health. Most adults should: Exercise for at least 150 minutes each week. The exercise should increase your heart rate and make you sweat (moderate-intensity exercise). Do strengthening exercises at least twice a week. This is in addition to the moderate-intensity exercise. Spend less time sitting. Even light physical activity can be beneficial. Watch cholesterol and blood lipids Have your blood tested for lipids and cholesterol at 87 years of age, then have this test every 5 years. You may need to have your cholesterol levels checked more often if: Your lipid or cholesterol levels are high. You are older than 87 years of age. You are at high risk for heart disease. What should I know about cancer screening? Many types of cancers can be detected early and may often be prevented. Depending on your health history and family history, you may need to have cancer screening at various ages. This may include screening for: Colorectal cancer. Prostate cancer. Skin cancer. Lung  cancer. What should I know about heart disease, diabetes, and high blood pressure? Blood pressure and heart disease High blood pressure causes heart disease and increases the risk of stroke. This is more likely to develop in people who have high blood pressure readings or are overweight. Talk with your health care provider about your target blood pressure readings. Have your blood pressure checked: Every 3-5 years if you are 18-39 years of age. Every year if you are 40 years old or older. If you are between the ages of 65 and 75 and are a current or former smoker, ask your health care provider if you should have a one-time screening for abdominal aortic aneurysm (AAA). Diabetes Have regular diabetes screenings. This checks your fasting blood sugar level. Have the screening done: Once every three years after age 45 if you are at a normal weight and have a low risk for diabetes. More often and at a younger age if you are overweight or have a high risk for diabetes. What should I know about preventing infection? Hepatitis B If you have a higher risk for hepatitis B, you should be screened for this virus. Talk with your health care provider to find out if you are at risk for hepatitis B infection. Hepatitis C Blood testing is recommended for: Everyone born from 1945 through 1965. Anyone with known risk factors for hepatitis C. Sexually transmitted infections (STIs) You should be screened each year for STIs, including gonorrhea and chlamydia, if: You are sexually active and are younger than 87 years of age. You are older than 87 years of age and your   health care provider tells you that you are at risk for this type of infection. Your sexual activity has changed since you were last screened, and you are at increased risk for chlamydia or gonorrhea. Ask your health care provider if you are at risk. Ask your health care provider about whether you are at high risk for HIV. Your health care provider  may recommend a prescription medicine to help prevent HIV infection. If you choose to take medicine to prevent HIV, you should first get tested for HIV. You should then be tested every 3 months for as long as you are taking the medicine. Follow these instructions at home: Alcohol use Do not drink alcohol if your health care provider tells you not to drink. If you drink alcohol: Limit how much you have to 0-2 drinks a day. Know how much alcohol is in your drink. In the [place] one drink equals one 12 oz bottle of beer (355 mL), one 5 oz glass of wine (148 mL), or one 1 oz glass of hard liquor (44 mL). Lifestyle Do not use any products that contain nicotine or tobacco. These products include cigarettes, chewing tobacco, and vaping devices, such as e-cigarettes. If you need help quitting, ask your health care provider. Do not use street drugs. Do not share needles. Ask your health care provider for help if you need support or information about quitting drugs. General instructions Schedule regular health, dental, and eye exams. Stay current with your vaccines. Tell your health care provider if: You often feel depressed. You have ever been abused or do not feel safe at home. Summary Adopting a healthy lifestyle and getting preventive care are important in promoting health and wellness. Follow your health care provider's instructions about healthy diet, exercising, and getting tested or screened for diseases. Follow your health care provider's instructions on monitoring your cholesterol and blood pressure. This information is not intended to replace advice given to you by your health care provider. Make sure you discuss any questions you have with your health care provider. Document Revised: 05/30/2020 Document Reviewed: 05/30/2020 Elsevier Patient Education  2023 Elsevier Inc.  

## 2022-03-29 LAB — CBC WITH DIFFERENTIAL/PLATELET
Basophils Absolute: 0 10*3/uL (ref 0.0–0.2)
Basos: 0 %
EOS (ABSOLUTE): 0.1 10*3/uL (ref 0.0–0.4)
Eos: 2 %
Hematocrit: 36.7 % — ABNORMAL LOW (ref 37.5–51.0)
Hemoglobin: 12 g/dL — ABNORMAL LOW (ref 13.0–17.7)
Immature Grans (Abs): 0 10*3/uL (ref 0.0–0.1)
Immature Granulocytes: 0 %
Lymphocytes Absolute: 1.2 10*3/uL (ref 0.7–3.1)
Lymphs: 33 %
MCH: 30 pg (ref 26.6–33.0)
MCHC: 32.7 g/dL (ref 31.5–35.7)
MCV: 92 fL (ref 79–97)
Monocytes Absolute: 0.4 10*3/uL (ref 0.1–0.9)
Monocytes: 11 %
Neutrophils Absolute: 2 10*3/uL (ref 1.4–7.0)
Neutrophils: 54 %
Platelets: 170 10*3/uL (ref 150–450)
RBC: 4 x10E6/uL — ABNORMAL LOW (ref 4.14–5.80)
RDW: 13.3 % (ref 11.6–15.4)
WBC: 3.7 10*3/uL (ref 3.4–10.8)

## 2022-03-29 LAB — IRON,TIBC AND FERRITIN PANEL
Ferritin: 106 ng/mL (ref 30–400)
Iron Saturation: 16 % (ref 15–55)
Iron: 52 ug/dL (ref 38–169)
Total Iron Binding Capacity: 325 ug/dL (ref 250–450)
UIBC: 273 ug/dL (ref 111–343)

## 2022-03-29 LAB — CMP14+EGFR
ALT: 11 IU/L (ref 0–44)
AST: 16 IU/L (ref 0–40)
Albumin/Globulin Ratio: 1.7 (ref 1.2–2.2)
Albumin: 4.1 g/dL (ref 3.7–4.7)
Alkaline Phosphatase: 79 IU/L (ref 44–121)
BUN/Creatinine Ratio: 17 (ref 10–24)
BUN: 17 mg/dL (ref 8–27)
Bilirubin Total: 0.6 mg/dL (ref 0.0–1.2)
CO2: 23 mmol/L (ref 20–29)
Calcium: 9.3 mg/dL (ref 8.6–10.2)
Chloride: 101 mmol/L (ref 96–106)
Creatinine, Ser: 1.01 mg/dL (ref 0.76–1.27)
Globulin, Total: 2.4 g/dL (ref 1.5–4.5)
Glucose: 111 mg/dL — ABNORMAL HIGH (ref 70–99)
Potassium: 4.4 mmol/L (ref 3.5–5.2)
Sodium: 140 mmol/L (ref 134–144)
Total Protein: 6.5 g/dL (ref 6.0–8.5)
eGFR: 71 mL/min/{1.73_m2} (ref >59)

## 2022-03-29 LAB — B12 AND FOLATE PANEL
Folate: 5.2 ng/mL (ref >3.0)
Vitamin B-12: 298 pg/mL (ref 232–1245)

## 2022-03-29 LAB — HEMOGLOBIN A1C
Est. average glucose Bld gHb Est-mCnc: 108 mg/dL
Hgb A1c MFr Bld: 5.4 % (ref 4.8–5.6)

## 2022-03-29 LAB — TSH+FREE T4
Free T4: 1.56 ng/dL (ref 0.82–1.77)
TSH: 1.61 u[IU]/mL (ref 0.450–4.500)

## 2022-03-29 LAB — VITAMIN D 25 HYDROXY (VIT D DEFICIENCY, FRACTURES): Vit D, 25-Hydroxy: 40.9 ng/mL (ref 30.0–100.0)

## 2022-04-03 NOTE — Progress Notes (Unsigned)
Cardiology Office Note  Date: 04/04/2022   ID: Tristan Lopez, DOB 07-04-1930, MRN [id number]  History of Present Illness: Tristan Lopez is an 89 y.o. male last seen in February 2023 by Ms. Strader PA-C, I reviewed the note.  He is here today with his son for a follow-up visit.  Reports no angina, no palpitations or syncope.  He uses a walker to ambulate.  Visits his wife regularly at a nursing home in [place] near his home.  He continues on Coumadin with follow-up in the anticoagulation clinic.  No reported spontaneous bleeding problems.  Recent INR 2.1.  We went over his medications today.  They did have some questions about statin side effects, he has some achiness of his legs, but not progressive.  He is on Crestor 10 mg daily and his last LDL was 69 in September 2023.  For now we have discussed continuing the current dose in light of significant vascular disease.  I did review his carotid Dopplers and abdominal CT from January.  Physical Exam: VS:  BP (!) 140/67   Pulse 71   Ht '5\' 8"'$  (1.727 m)   Wt 233 lb (105.7 kg)   BMI 35.43 kg/m , BMI Body mass index is 35.43 kg/m.  Wt Readings from Last 3 Encounters:  04/04/22 233 lb (105.7 kg)  03/27/22 229 lb 6.4 oz (104.1 kg)  02/11/22 230 lb (104.3 kg)    General: Patient appears comfortable at rest. HEENT: Conjunctiva and lids normal. Neck: Supple, no elevated JVP or carotid bruits. Lungs: Clear to auscultation, nonlabored breathing at rest. Cardiac: Irregularly irregular, no S3, 1/6 systolic murmur. Abdomen: Soft, nontender, bowel sounds present. Extremities: No pitting edema.  ECG:  An ECG dated 08/30/2021 was personally reviewed today and demonstrated:  Atrial fibrillation with low voltage.  Labwork: 03/27/2022: ALT 11; AST 16; BUN 17; Creatinine, Ser 1.01; Hemoglobin 12.0; Platelets 170; Potassium 4.4; Sodium 140; TSH 1.610     Component Value Date/Time   CHOL 134 10/04/2021 1044   TRIG 63 10/04/2021 1044   HDL  52 10/04/2021 1044   CHOLHDL 2.6 10/04/2021 1044   CHOLHDL 3.4 07/28/2019 1132   VLDL 17 02/17/2013 0820   LDLCALC 69 10/04/2021 1044   LDLCALC 84 07/28/2019 1132   Other Studies Reviewed Today:  CT abdomen 02/15/2022: IMPRESSION: 1. Vascular calcification versus nonobstructing bilateral renal calculi. No hydronephrosis. 2. Diffusely thickened and trabeculated appearance of the bladder wall likely related to chronic bladder outlet obstruction. Correlation with urinalysis recommended to exclude cystitis. 3. Colonic diverticulosis.  No bowel obstruction. Normal appendix. 4. A 4.4 cm infrarenal abdominal aortic aneurysm (previously 3.8 cm). Recommend follow-up every 12 months and vascular consultation. This recommendation follows ACR consensus guidelines: White Paper of the ACR Incidental Findings Committee II on Vascular Findings. J Am Coll Radiol 2013CJ:3944253. 5.  Aortic Atherosclerosis (ICD10-I70.0).  Carotid Dopplers 02/16/2022: IMPRESSION: Heterogeneous and partially calcified plaque at the bilateral carotid bifurcation contributes to 50-69% stenosis by established duplex criteria. Note that the flow velocities of the bilateral ICA were obtained from an area distal to the maximum narrowing due to the presence of anterior wall plaque with shadowing and may be underestimating the percentage of ICA stenosis. If establishing a more accurate degree of stenosis is required, cerebral angiogram should be considered, or as a second best test, CTA.  Assessment and Plan:  1.  Permanent atrial fibrillation with CHA2DS2-VASc score of 4.  He is on Coumadin for stroke prophylaxis with follow-up in  the anticoagulation clinic.  No reported palpitations, heart rate is well-controlled on Inderal.  2.  Moderate bilateral carotid artery disease, asymptomatic with carotid Dopplers from January noted above.  Not on aspirin given use of Coumadin.  He is on Crestor as well.  3.  Asymptomatic  infrarenal abdominal aortic aneurysm, 4.4 cm by most recent CT imaging in January.  Remains asymptomatic.  Consider repeat imaging in January of next year.  4.  Multivessel coronary artery calcification by CT imaging.  No active angina.  5.  Essential hypertension.  6.  Mixed hyperlipidemia.  LDL 69 in September 2023.  Will continue Crestor for now.  Disposition:  Follow up  6 months.  Signed, Satira Sark, M.D., F.A.C.C.

## 2022-04-04 ENCOUNTER — Ambulatory Visit: Payer: Medicare PPO

## 2022-04-04 ENCOUNTER — Ambulatory Visit: Payer: Medicare PPO | Attending: Cardiology | Admitting: Cardiology

## 2022-04-04 ENCOUNTER — Encounter: Payer: Self-pay | Admitting: Cardiology

## 2022-04-04 VITALS — BP 140/67 | HR 71 | Ht 68.0 in | Wt 233.0 lb

## 2022-04-04 DIAGNOSIS — I6523 Occlusion and stenosis of bilateral carotid arteries: Secondary | ICD-10-CM | POA: Diagnosis not present

## 2022-04-04 DIAGNOSIS — I4821 Permanent atrial fibrillation: Secondary | ICD-10-CM

## 2022-04-04 DIAGNOSIS — I2510 Atherosclerotic heart disease of native coronary artery without angina pectoris: Secondary | ICD-10-CM | POA: Diagnosis not present

## 2022-04-04 DIAGNOSIS — I7143 Infrarenal abdominal aortic aneurysm, without rupture: Secondary | ICD-10-CM

## 2022-04-04 NOTE — Patient Instructions (Signed)
Medication Instructions:  Your physician recommends that you continue on your current medications as directed. Please refer to the Current Medication list given to you today.   Labwork: None today  Testing/Procedures: None today  Follow-Up: 6 months  Any Other Special Instructions Will Be Listed Below (If Applicable).  If you need a refill on your cardiac medications before your next appointment, please call your pharmacy.  

## 2022-04-09 ENCOUNTER — Ambulatory Visit: Payer: Medicare PPO

## 2022-04-10 ENCOUNTER — Other Ambulatory Visit: Payer: Self-pay | Admitting: Internal Medicine

## 2022-04-10 DIAGNOSIS — D508 Other iron deficiency anemias: Secondary | ICD-10-CM

## 2022-04-12 ENCOUNTER — Telehealth: Payer: Self-pay | Admitting: Cardiology

## 2022-04-12 ENCOUNTER — Ambulatory Visit (INDEPENDENT_AMBULATORY_CARE_PROVIDER_SITE_OTHER): Payer: Medicare PPO | Admitting: Urology

## 2022-04-12 DIAGNOSIS — I4891 Unspecified atrial fibrillation: Secondary | ICD-10-CM

## 2022-04-12 DIAGNOSIS — R339 Retention of urine, unspecified: Secondary | ICD-10-CM | POA: Diagnosis not present

## 2022-04-12 NOTE — Telephone Encounter (Signed)
Son is following-up on scheduling patient's next Coumadin check.

## 2022-04-12 NOTE — Progress Notes (Signed)
Cath Change/ Replacement  Patient is present today for a catheter change due to urinary retention.  15 ml of water was removed from the balloon, a 18FR foley cath was removed without difficulty.  Patient was cleaned and prepped in a sterile fashion with betadine. A 18 FR foley cath was replaced into the bladder, no complications were noted. Urine return was noted 15 ml and urine was yellow in color. The balloon was filled with 5ml of sterile water. A leg bag was attached for drainage.  A night bag was also given to the patient and patient was given instruction on how to change from one bag to another. Patient was given proper instruction on catheter care.    Performed by: Marisue Brooklyn, CMA  Follow up: Keep scheduled appt.

## 2022-04-13 NOTE — Telephone Encounter (Signed)
Called and spoke to pt's son.   Pt had last INR checked on 03/22/2022. INR 2.1, pt has not had any medication changes or bleeding. Pt's son asked when pt should go again to have INR checked again. Instructed for pt to have INR checked on 4/11.   Verified correct address and mailed order to [facility].

## 2022-05-03 ENCOUNTER — Other Ambulatory Visit: Payer: Self-pay | Admitting: Cardiology

## 2022-05-03 LAB — PROTIME-INR

## 2022-05-04 ENCOUNTER — Ambulatory Visit (INDEPENDENT_AMBULATORY_CARE_PROVIDER_SITE_OTHER): Payer: Medicare PPO

## 2022-05-04 DIAGNOSIS — I4891 Unspecified atrial fibrillation: Secondary | ICD-10-CM | POA: Diagnosis not present

## 2022-05-04 DIAGNOSIS — Z5181 Encounter for therapeutic drug level monitoring: Secondary | ICD-10-CM | POA: Diagnosis not present

## 2022-05-04 LAB — PROTIME-INR
INR: 1.9 — ABNORMAL HIGH (ref 0.9–1.2)
Prothrombin Time: 20.1 s — ABNORMAL HIGH (ref 9.1–12.0)

## 2022-05-04 NOTE — Patient Instructions (Signed)
Description   Spoke with [name], pt's son and advised pt to take 1.5 tablets today and then continue taking warfarin 5 mg daily except 2.5mg  on Sundays and Thursdays.  Recheck INR on 05/30/22  Pt gets labs done at [facility] in [place]. Son [name] manages pts medicine. Lab orders should be mailed to [address] address.

## 2022-05-07 ENCOUNTER — Other Ambulatory Visit: Payer: Self-pay | Admitting: Student

## 2022-05-11 ENCOUNTER — Ambulatory Visit (INDEPENDENT_AMBULATORY_CARE_PROVIDER_SITE_OTHER): Payer: Medicare PPO | Admitting: Urology

## 2022-05-11 ENCOUNTER — Encounter: Payer: Self-pay | Admitting: Urology

## 2022-05-11 VITALS — BP 176/82 | HR 85

## 2022-05-11 DIAGNOSIS — N401 Enlarged prostate with lower urinary tract symptoms: Secondary | ICD-10-CM | POA: Diagnosis not present

## 2022-05-11 DIAGNOSIS — Z87440 Personal history of urinary (tract) infections: Secondary | ICD-10-CM

## 2022-05-11 DIAGNOSIS — R339 Retention of urine, unspecified: Secondary | ICD-10-CM

## 2022-05-11 DIAGNOSIS — N138 Other obstructive and reflux uropathy: Secondary | ICD-10-CM

## 2022-05-11 DIAGNOSIS — N3000 Acute cystitis without hematuria: Secondary | ICD-10-CM

## 2022-05-11 MED ORDER — MIRABEGRON ER 25 MG PO TB24: 25.0000 mg | ORAL_TABLET | Freq: Every day | ORAL | 11 refills | Status: DC

## 2022-05-11 NOTE — Progress Notes (Unsigned)
Cath Change/ Replacement  Patient is present today for a catheter change due to urinary retention.  10ml of water was removed from the balloon, a 18FR foley cath was removed without difficulty.  Patient was cleaned and prepped in a sterile fashion with betadine and 2% lidocaine jelly was instilled into the urethra. A 18 FR foley cath was replaced into the bladder, no complications were noted. Urine return was noted and urine was yellow in color. The balloon was filled with 10ml of sterile water. A leg bag was attached for drainage.  A night bag was also given to the patient and patient was given instruction on how to change from one bag to another. Patient was given proper instruction on catheter care.    Performed by: Essie Lagunes LPN  Follow up: 1 month cath change

## 2022-05-11 NOTE — Progress Notes (Unsigned)
05/11/2022 9:51 AM   Tristan Lopez 1930-02-13 [id number]  Referring provider: Donell Beers, FNP [address] [place],,  [place] [address]  No chief complaint on file.   HPI:    PMH: Past Medical History:  Diagnosis Date   Abdominal aortic aneurysm without rupture (HCC)    Bilateral renal cysts    BPH (benign prostatic hyperplasia)    Carotid artery occlusion    Chronic atrial fibrillation (HCC)    COPD (chronic obstructive pulmonary disease) (HCC)    Diverticulosis    Pancolonic   Essential hypertension    Hemorrhoids    Hiatal hernia    Hx of adenomatous colonic polyps    Hypercholesteremia    Liver cyst    Benign by liver biopsy September 2013   Nephrolithiasis    Nephrolithiasis    Permanent atrial fibrillation (HCC)    Pre-diabetes    Reflux esophagitis    Sleep apnea    Uses CPAP   Tubular adenoma     Surgical History: Past Surgical History:  Procedure Laterality Date   COLONOSCOPY     2003, no polyps per patient. [name]   COLONOSCOPY  11/01/11   Dr. Jena Gauss- tubular adenoma,suboptimal preparation, internal hemorrhoids, o/w normal rectum. pancolonic diverticulosis   COLONOSCOPY WITH PROPOFOL N/A 11/29/2020   Procedure: COLONOSCOPY WITH PROPOFOL;  Surgeon: Dolores Frame, MD;  [facility];  Service: Gastroenterology;  Laterality: N/A;   ESOPHAGOGASTRODUODENOSCOPY  11/01/11   Dr. Jena Gauss- erosive reflux esophagitis along with a patulous EG junction, hialtal hernia, antral erosions with possible area of healing ulceration- s/p bx= chronic erosive gastritis, no malignancy   ESOPHAGOGASTRODUODENOSCOPY (EGD) WITH PROPOFOL N/A 11/29/2020   Procedure: ESOPHAGOGASTRODUODENOSCOPY (EGD) WITH PROPOFOL;  Surgeon: Dolores Frame, MD;  [facility];  Service: Gastroenterology;  Laterality: N/A;   HERNIA REPAIR     x2, bilateral inguinal    KIDNEY STONE SURGERY     KNEE ARTHROSCOPY     X2   NASAL  ENDOSCOPY      Home Medications:  Allergies as of 05/11/2022       Reactions   Latex Rash        Medication List        Accurate as of May 11, 2022  9:51 AM. If you have any questions, ask your nurse or doctor.          amLODipine 5 MG tablet Commonly known as: NORVASC Take 1 tablet (5 mg total) by mouth daily.   benazepril 40 MG tablet Commonly known as: LOTENSIN TAKE 1 TABLET (40 MG TOTAL) BY MOUTH DAILY.   DIGESTIVE ADVANTAGE PO Take by mouth 2 (two) times daily.   FeroSul 325 (65 FE) MG tablet Generic drug: ferrous sulfate TAKE ONE TABLET BY MOUTH EVERY DAY   furosemide 20 MG tablet Commonly known as: LASIX TAKE 1 TABLET (20 MG TOTAL) BY MOUTH DAILY. TAKE 20 MG DAILY AS NEEDED FOR LEG SWELLING   Garlic 1000 MG Caps Take 1,000 mg by mouth daily.   mirabegron ER 25 MG Tb24 tablet Commonly known as: MYRBETRIQ Take 1 tablet (25 mg total) by mouth daily.   pantoprazole 40 MG tablet Commonly known as: PROTONIX TAKE ONE TABLET BY MOUTH EVERY DAY   potassium citrate 10 MEQ (1080 MG) SR tablet Commonly known as: UROCIT-K Take 1 tablet (10 mEq total) by mouth daily.   propranolol 20 MG tablet Commonly known as: INDERAL TAKE 1 TABLET (20 MG  TOTAL) BY MOUTH DAILY.   rosuvastatin 10 MG tablet Commonly known as: CRESTOR TAKE 1 TABLET (10 MG TOTAL) BY MOUTH DAILY.   tadalafil 5 MG tablet Commonly known as: CIALIS TAKE ONE TABLET BY MOUTH EVERY DAY   vitamin C 1000 MG tablet Take 1,000 mg by mouth daily.   Vitamin D-3 25 MCG (1000 UT) Caps Take 2,000 Units by mouth daily.   warfarin 5 MG tablet Commonly known as: COUMADIN Take as directed by the anticoagulation clinic. If you are unsure how to take this medication, talk to your nurse or doctor. Original instructions: TAKE 1 TABLET (5 MG TOTAL) BY MOUTH DAILY.        Allergies:  Allergies  Allergen Reactions   Latex Rash    Family History: Family History  Problem Relation Age of Onset    Breast cancer Mother    Other Mother        Essential tremor   Stroke Mother    Heart disease Father        Pacemaker   Heart attack Maternal Grandfather    Renal cancer Son    Colon cancer Neg Hx    Liver disease Neg Hx    Prostate cancer Neg Hx    Bladder Cancer Neg Hx    Kidney cancer Neg Hx     Social History:  reports that he quit smoking about 32 years ago. His smoking use included cigarettes. He smoked an average of .5 packs per day. He has never used smokeless tobacco. He reports that he does not drink alcohol and does not use drugs.  ROS: All other review of systems were reviewed and are negative except what is noted above in HPI  Physical Exam: BP (!) 176/82   Pulse 85   Constitutional:  Alert and oriented, No acute distress. HEENT: [place] AT, moist mucus membranes.  Trachea midline, no masses. Cardiovascular: No clubbing, cyanosis, or edema. Respiratory: Normal respiratory effort, no increased work of breathing. GI: Abdomen is soft, nontender, nondistended, no abdominal masses GU: No CVA tenderness.  Lymph: No cervical or inguinal lymphadenopathy. Skin: No rashes, bruises or suspicious lesions. Neurologic: Grossly intact, no focal deficits, moving all 4 extremities. Psychiatric: Normal mood and affect.  Laboratory Data: Lab Results  Component Value Date   WBC 3.7 03/27/2022   HGB 12.0 (L) 03/27/2022   HCT 36.7 (L) 03/27/2022   MCV 92 03/27/2022   PLT 170 03/27/2022    Lab Results  Component Value Date   CREATININE 1.01 03/27/2022    No results found for: "PSA"  No results found for: "TESTOSTERONE"  Lab Results  Component Value Date   HGBA1C 5.4 03/27/2022    Urinalysis    Component Value Date/Time   COLORURINE YELLOW 02/15/2022 1940   APPEARANCEUR CLOUDY (A) 02/15/2022 1940   APPEARANCEUR Cloudy (A) 05/10/2021 1001   LABSPEC 1.013 02/15/2022 1940   PHURINE 8.0 02/15/2022 1940   GLUCOSEU NEGATIVE 02/15/2022 1940   HGBUR NEGATIVE 02/15/2022 1940    BILIRUBINUR NEGATIVE 02/15/2022 1940   BILIRUBINUR Negative 05/10/2021 1001   KETONESUR NEGATIVE 02/15/2022 1940   PROTEINUR 100 (A) 02/15/2022 1940   UROBILINOGEN 0.2 10/04/2011 2218   NITRITE NEGATIVE 02/15/2022 1940   LEUKOCYTESUR LARGE (A) 02/15/2022 1940    Lab Results  Component Value Date   LABMICR See below: 05/10/2021   WBCUA 11-30 (A) 05/10/2021   LABEPIT 0-10 05/10/2021   MUCUS Present 05/10/2021   BACTERIA MANY (A) 02/15/2022    Pertinent Imaging: ***  No results found for this or any previous visit.  No results found for this or any previous visit.  No results found for this or any previous visit.  No results found for this or any previous visit.  No results found for this or any previous visit.  No valid procedures specified. No results found for this or any previous visit.  Results for orders placed during the hospital encounter of 02/15/22  CT Renal Stone Study  Narrative CLINICAL DATA:  Abdominal pain.  Concern for kidney stone.  EXAM: CT ABDOMEN AND PELVIS WITHOUT CONTRAST  TECHNIQUE: Multidetector CT imaging of the abdomen and pelvis was performed following the standard protocol without IV contrast.  RADIATION DOSE REDUCTION: This exam was performed according to the departmental dose-optimization program which includes automated exposure control, adjustment of the mA and/or kV according to patient size and/or use of iterative reconstruction technique.  COMPARISON:  CT abdomen pelvis dated 06/06/2020.  FINDINGS: Evaluation of this exam is limited in the absence of intravenous contrast.  Lower chest: The visualized lung bases are clear. There is cardiomegaly with 3 vessel coronary vascular calcification.  No intra-abdominal free air or free fluid.  Hepatobiliary: Similar appearance of heterogeneous exophytic hepatic masses with rim calcification measuring up to 7.2 cm in diameter in the right lobe of the liver. No biliary ductal  dilatation. The gallbladder is unremarkable  Pancreas: Unremarkable. No pancreatic ductal dilatation or surrounding inflammatory changes.  Spleen: Normal in size without focal abnormality.  Adrenals/Urinary Tract: The adrenal glands are unremarkable. There is a 1 cm linear nonobstructing left renal inferior pole calculus. Punctate nonobstructing calculus versus vascular calcification of the inferior pole of the right kidney. There is no hydronephrosis on either side. Bilateral renal cysts. The visualized ureters appear unremarkable. Diffusely thickened and trabeculated appearance of the bladder wall likely related to chronic bladder outlet obstruction. Correlation with urinalysis recommended to exclude cystitis. The urinary bladder is minimally distended. A Foley catheter is noted with balloon in the bladder. Several subcentimeter bladder calculi versus bladder wall calcification.  Stomach/Bowel: There is a small hiatal hernia. There is extensive colonic diverticulosis without active inflammatory changes. There is no bowel obstruction or active inflammation. The appendix is normal.  Vascular/Lymphatic: Advanced aortoiliac atherosclerotic disease. There is a 4.4 cm infrarenal abdominal aortic aneurysm (previously 3.8 cm). The IVC is unremarkable. No portal venous gas. There is no adenopathy.  Reproductive: Mildly enlarged prostate gland measuring 5.5 cm in transverse axial diameter.  Other: None  Musculoskeletal: Osteopenia with degenerative changes of the spine. No acute osseous pathology.  IMPRESSION: 1. Vascular calcification versus nonobstructing bilateral renal calculi. No hydronephrosis. 2. Diffusely thickened and trabeculated appearance of the bladder wall likely related to chronic bladder outlet obstruction. Correlation with urinalysis recommended to exclude cystitis. 3. Colonic diverticulosis.  No bowel obstruction. Normal appendix. 4. A 4.4 cm infrarenal abdominal  aortic aneurysm (previously 3.8 cm). Recommend follow-up every 12 months and vascular consultation. This recommendation follows ACR consensus guidelines: White Paper of the ACR Incidental Findings Committee II on Vascular Findings. J Am Coll Radiol 2013; 09:811-914. 5.  Aortic Atherosclerosis (ICD10-I70.0).   Electronically Signed By: Elgie Collard M.D. On: 02/15/2022 21:58   Assessment & Plan:    1. Urinary retention -continue indwelling foley - INSERT,TEMP INDWELLING BLAD CATH,SIMPLE  2. Benign prostatic hyperplasia with urinary obstruction ***  3. Acute cystitis without hematuria ***   No follow-ups on file.  Wilkie Aye, MD  [facility]

## 2022-05-11 NOTE — Patient Instructions (Signed)

## 2022-05-30 LAB — PROTIME-INR: INR: 1.7 — AB (ref 0.80–1.20)

## 2022-05-31 ENCOUNTER — Ambulatory Visit (INDEPENDENT_AMBULATORY_CARE_PROVIDER_SITE_OTHER): Payer: Medicare PPO | Admitting: *Deleted

## 2022-05-31 DIAGNOSIS — Z5181 Encounter for therapeutic drug level monitoring: Secondary | ICD-10-CM | POA: Diagnosis not present

## 2022-05-31 DIAGNOSIS — I4891 Unspecified atrial fibrillation: Secondary | ICD-10-CM | POA: Diagnosis not present

## 2022-05-31 LAB — PROTIME-INR
INR: 1.7 — ABNORMAL HIGH (ref 0.9–1.2)
Prothrombin Time: 17.5 s — ABNORMAL HIGH (ref 9.1–12.0)

## 2022-05-31 NOTE — Patient Instructions (Signed)
Spoke with [name], pt's son and advised pt to increase dose to 5 mg daily except 2.5mg  on Sundays   Recheck INR on 06/26/22  Pt gets labs done at [facility] in [place]. Son [name] manages pts medicine. Lab orders mailed to [address] [place], [place] [address]

## 2022-06-14 ENCOUNTER — Ambulatory Visit (INDEPENDENT_AMBULATORY_CARE_PROVIDER_SITE_OTHER): Payer: Medicare PPO

## 2022-06-14 DIAGNOSIS — R339 Retention of urine, unspecified: Secondary | ICD-10-CM | POA: Diagnosis not present

## 2022-06-14 NOTE — Progress Notes (Addendum)
Cath Change/ Replacement  Patient is present today for a catheter change due to urinary retention.  10ml of water was removed from the balloon, a 18FR foley cath was removed without difficulty.  Patient was cleaned and prepped in a sterile fashion with betadine . A 18 FR foley cath was replaced into the bladder, no complications were noted. Urine return was noted 10 ml and urine was yellow in color. The balloon was filled with 10ml of sterile water. A leg bag was attached for drainage.  A night bag was also given to the patient and patient was given instruction on how to change from one bag to another. Patient was given proper instruction on catheter care.    Performed by: Kennyth Lose, CMA  Follow up: No follow-ups on file.

## 2022-06-26 ENCOUNTER — Encounter: Payer: Self-pay | Admitting: Internal Medicine

## 2022-06-27 ENCOUNTER — Other Ambulatory Visit: Payer: Self-pay | Admitting: Cardiology

## 2022-06-27 LAB — POCT INR: INR: 2 (ref 2.0–3.0)

## 2022-06-28 ENCOUNTER — Ambulatory Visit (INDEPENDENT_AMBULATORY_CARE_PROVIDER_SITE_OTHER): Payer: Medicare PPO | Admitting: *Deleted

## 2022-06-28 DIAGNOSIS — I4891 Unspecified atrial fibrillation: Secondary | ICD-10-CM

## 2022-06-28 DIAGNOSIS — Z5181 Encounter for therapeutic drug level monitoring: Secondary | ICD-10-CM

## 2022-06-28 LAB — PROTIME-INR
INR: 2 — ABNORMAL HIGH (ref 0.9–1.2)
Prothrombin Time: 20.5 s — ABNORMAL HIGH (ref 9.1–12.0)

## 2022-06-28 NOTE — Patient Instructions (Signed)
Spoke with Tristan Lopez, pt's son and advised pt to continue warfarin 5 mg daily except 2.5mg  on Sundays   Recheck INR on 06/26/22  Pt gets labs done at [facility] in [place]. Son Tristan Lopez manages pts medicine. Lab orders mailed to [address] [place], [place] [address]

## 2022-07-02 ENCOUNTER — Other Ambulatory Visit: Payer: Self-pay | Admitting: Internal Medicine

## 2022-07-16 ENCOUNTER — Ambulatory Visit (INDEPENDENT_AMBULATORY_CARE_PROVIDER_SITE_OTHER): Payer: Medicare PPO

## 2022-07-16 DIAGNOSIS — R339 Retention of urine, unspecified: Secondary | ICD-10-CM | POA: Diagnosis not present

## 2022-07-16 NOTE — Progress Notes (Signed)
Cath Change/ Replacement  Patient is present today for a catheter change due to urinary retention.  10 ml of water was removed from the balloon, a 18FR foley cath was removed without difficulty.  Patient was cleaned  fashion with betadine . A 18 FR foley cath was replaced into the bladder, no complications were noted. Urine return was noted 5ml and urine was yellow in color. The balloon was filled with 10ml of sterile water. A leg bag was attached for drainage.  A night bag was also given to the patient and patient was given instruction on how to change from one bag to another. Patient was given proper instruction on catheter care.    Performed by: Kennyth Lose, CMA  Follow up: No follow-ups on file.

## 2022-07-27 ENCOUNTER — Telehealth: Payer: Self-pay | Admitting: *Deleted

## 2022-07-27 NOTE — Telephone Encounter (Signed)
Called pt since INR is OVERDUE. Spoke with pt's dtr and she stated her brother states the pt due to have his INR drawn next week. Per 06/28/22 anticoagulation encounter pt was due on 7/3 and she stated she is pretty sure they were told next week (brother keeps up with the appts). Advised will have Misty Stanley to follow up regarding this.

## 2022-07-30 NOTE — Telephone Encounter (Signed)
Pt's son is correct on date due to 4th July holiday.  He will check 7/8 or 7/9.

## 2022-08-02 ENCOUNTER — Ambulatory Visit (INDEPENDENT_AMBULATORY_CARE_PROVIDER_SITE_OTHER): Payer: Medicare PPO | Admitting: *Deleted

## 2022-08-02 DIAGNOSIS — Z5181 Encounter for therapeutic drug level monitoring: Secondary | ICD-10-CM | POA: Diagnosis not present

## 2022-08-02 DIAGNOSIS — I4891 Unspecified atrial fibrillation: Secondary | ICD-10-CM | POA: Diagnosis not present

## 2022-08-02 LAB — PROTIME-INR: Prothrombin Time: 28.7 s — ABNORMAL HIGH (ref 9.1–12.0)

## 2022-08-02 NOTE — Patient Instructions (Signed)
Spoke with [name], pt's son and advised pt to continue warfarin 5 mg daily except 2.5mg  on Sundays   Recheck INR on 09/12/22  Pt gets labs done at [facility] in [place]. Son [name] manages pts medicine. Lab orders mailed to [address] [place], [place] [address]

## 2022-08-03 ENCOUNTER — Other Ambulatory Visit: Payer: Self-pay | Admitting: Internal Medicine

## 2022-08-03 DIAGNOSIS — D508 Other iron deficiency anemias: Secondary | ICD-10-CM

## 2022-08-15 ENCOUNTER — Ambulatory Visit (INDEPENDENT_AMBULATORY_CARE_PROVIDER_SITE_OTHER): Payer: Medicare PPO

## 2022-08-15 DIAGNOSIS — R339 Retention of urine, unspecified: Secondary | ICD-10-CM | POA: Diagnosis not present

## 2022-08-15 NOTE — Progress Notes (Signed)
Cath Change/ Replacement  Patient is present today for a catheter change due to urinary retention.  10ml of water was removed from the balloon, a 18FR foley cath was removed without difficulty.  Patient was cleaned and prepped in a sterile fashion with betadine and 2% lidocaine jelly was instilled into the urethra. A 18 FR foley cath was replaced into the bladder, no complications were noted. Urine return was noted 10ml and urine was yellow in color. The balloon was filled with 10ml of sterile water. A leg bag was attached for drainage.  A night bag was also given to the patient and patient was given instruction on how to change from one bag to another. Patient was given proper instruction on catheter care.    Performed by: Kennyth Lose, CMA  Follow up: No follow-ups on file.

## 2022-08-15 NOTE — Addendum Note (Signed)
Addended by: Christoper Fabian R on: 08/15/2022 01:49 PM   Modules accepted: Level of Service

## 2022-08-28 ENCOUNTER — Ambulatory Visit: Payer: Medicare PPO | Admitting: Internal Medicine

## 2022-08-28 ENCOUNTER — Encounter: Payer: Self-pay | Admitting: Internal Medicine

## 2022-08-28 VITALS — BP 168/79 | HR 67 | Temp 98.0°F | Ht 68.0 in | Wt 225.0 lb

## 2022-08-28 DIAGNOSIS — K922 Gastrointestinal hemorrhage, unspecified: Secondary | ICD-10-CM

## 2022-08-28 NOTE — Patient Instructions (Signed)
It was good to see you again today!  Continue pantoprazole or Protonix 40 mg every day indefinitely.  Best taken 30 minutes before a meal-typically breakfast  No further GI evaluation needed.  There is no way to prevent a future GI bleed.  Hopefully, this will not occur.  If any recurrent rectal bleeding, please let me know immediately.  Office visit with [place] in 1 year and as needed.

## 2022-08-28 NOTE — Progress Notes (Unsigned)
Primary Care Physician:  Billie Lade, MD Primary Gastroenterologist:  Dr. Jena Gauss  Pre-Procedure History & Physical: HPI:  Tristan Lopez is a 89 y.o. male here for follow-up.  History of diverticular bleed 2 years ago while on Coumadin.  This was a life-threatening multiunit bleed ultimately underwent EGD and colonoscopy Dr. Levon Hedger.  Found the suspect lesion in the ascending colon that was treated endoscopically.  He has done well since that time he takes Protonix 40 mg once daily.  Chronically dark stools on iron.  Last hemoglobin 12.06 months ago that is a stable range over the 1 year previously.  Clinically overall doing well; is accompanied by daughter.  He lives at home but has quite a bit of outside and family help. Last AAA assessment 3.9 cm 2 years ago; due for a follow-up imaging study later this year.  He tells me is seeing Dr. Diona Browner in the near future.  Past Medical History:  Diagnosis Date   Abdominal aortic aneurysm without rupture (HCC)    Bilateral renal cysts    BPH (benign prostatic hyperplasia)    Carotid artery occlusion    Chronic atrial fibrillation (HCC)    COPD (chronic obstructive pulmonary disease) (HCC)    Diverticulosis    Pancolonic   Essential hypertension    Hemorrhoids    Hiatal hernia    Hx of adenomatous colonic polyps    Hypercholesteremia    Liver cyst    Benign by liver biopsy September 2013   Nephrolithiasis    Nephrolithiasis    Permanent atrial fibrillation (HCC)    Pre-diabetes    Reflux esophagitis    Sleep apnea    Uses CPAP   Tubular adenoma     Past Surgical History:  Procedure Laterality Date   COLONOSCOPY     2003, no polyps per patient. [name]   COLONOSCOPY  11/01/11   Dr. Jena Gauss- tubular adenoma,suboptimal preparation, internal hemorrhoids, o/w normal rectum. pancolonic diverticulosis   COLONOSCOPY WITH PROPOFOL N/A 11/29/2020   Procedure: COLONOSCOPY WITH PROPOFOL;  Surgeon: Dolores Frame, MD;   [facility];  Service: Gastroenterology;  Laterality: N/A;   ESOPHAGOGASTRODUODENOSCOPY  11/01/11   Dr. Jena Gauss- erosive reflux esophagitis along with a patulous EG junction, hialtal hernia, antral erosions with possible area of healing ulceration- s/p bx= chronic erosive gastritis, no malignancy   ESOPHAGOGASTRODUODENOSCOPY (EGD) WITH PROPOFOL N/A 11/29/2020   Procedure: ESOPHAGOGASTRODUODENOSCOPY (EGD) WITH PROPOFOL;  Surgeon: Dolores Frame, MD;  [facility];  Service: Gastroenterology;  Laterality: N/A;   HERNIA REPAIR     x2, bilateral inguinal    KIDNEY STONE SURGERY     KNEE ARTHROSCOPY     X2   NASAL ENDOSCOPY      Prior to Admission medications   Medication Sig Start Date End Date Taking? Authorizing Provider  amLODipine (NORVASC) 5 MG tablet Take 1 tablet (5 mg total) by mouth daily. 12/28/21  Yes Billie Lade, MD  Ascorbic Acid (VITAMIN C) 1000 MG tablet Take 1,000 mg by mouth daily.   Yes [provider]  benazepril (LOTENSIN) 40 MG tablet TAKE 1 TABLET (40 MG TOTAL) BY MOUTH DAILY. 03/05/22  Yes Strader, Lennart Pall, PA-C  Cholecalciferol (VITAMIN D-3) 1000 units CAPS Take 2,000 Units by mouth daily.   Yes [provider]  FEROSUL 325 (65 Fe) MG tablet TAKE ONE TABLET BY MOUTH EVERY DAY 08/03/22  Yes Billie Lade, MD  furosemide (LASIX) 20 MG tablet TAKE  1 ABLET BY MOUTH DAILY AS NEEDED FOR LEG SWELLING 07/02/22  Yes Billie Lade, MD  Garlic 1000 MG CAPS Take 1,000 mg by mouth daily.   Yes [provider]  mirabegron ER (MYRBETRIQ) 25 MG TB24 tablet Take 1 tablet (25 mg total) by mouth daily. 05/11/22  Yes McKenzie, Mardene Celeste, MD  pantoprazole (PROTONIX) 40 MG tablet TAKE ONE TABLET BY MOUTH EVERY DAY 03/05/22  Yes Billie Lade, MD  potassium citrate (UROCIT-K) 10 MEQ (1080 MG) SR tablet Take 1 tablet (10 mEq total) by mouth daily. 03/27/22  Yes Billie Lade, MD  Probiotic Product (DIGESTIVE ADVANTAGE PO)  Take by mouth 2 (two) times daily.    Yes [provider]  propranolol (INDERAL) 20 MG tablet TAKE 1 TABLET (20 MG TOTAL) BY MOUTH DAILY. 05/07/22  Yes Strader, [place] M, PA-C  rosuvastatin (CRESTOR) 10 MG tablet TAKE 1 TABLET (10 MG TOTAL) BY MOUTH DAILY. 03/05/22  Yes Strader, [place] M, PA-C  tadalafil (CIALIS) 5 MG tablet TAKE ONE TABLET BY MOUTH EVERY DAY 12/29/21  Yes Billie Lade, MD  warfarin (COUMADIN) 5 MG tablet TAKE 1 TABLET (5 MG TOTAL) BY MOUTH DAILY. 04/10/22  Yes Billie Lade, MD    Allergies as of 08/28/2022 - Review Complete 08/28/2022  Allergen Reaction Noted   Latex Rash 10/04/2011    Family History  Problem Relation Age of Onset   Breast cancer Mother    Other Mother        Essential tremor   Stroke Mother    Heart disease Father        Pacemaker   Heart attack Maternal Grandfather    Renal cancer Son    Colon cancer Neg Hx    Liver disease Neg Hx    Prostate cancer Neg Hx    Bladder Cancer Neg Hx    Kidney cancer Neg Hx     Social History   Socioeconomic History   Marital status: Married    Spouse name: Not on file   Number of children: 3   Years of education: Not on file   Highest education level: Not on file  Occupational History    Employer: RETIRED   Occupation: Retired- Office manager and farming  Tobacco Use   Smoking status: Former    Current packs/day: 0.00    Types: Cigarettes    Quit date: 1992    Years since quitting: 32.6   Smokeless tobacco: Never  Vaping Use   Vaping status: Never Used  Substance and Sexual Activity   Alcohol use: No   Drug use: No   Sexual activity: Not Currently  Other Topics Concern   Not on file  Social History Narrative   Married for 68 years.Retired.No longer in Assisted Living, he came home after wife went to memory care. [facility] as of Dec 2021.   Social Determinants of Health   Financial Resource Strain: Low Risk  (03/27/2022)   Overall  Financial Resource Strain (CARDIA)    Difficulty of Paying Living Expenses: Not hard at all  Food Insecurity: No Food Insecurity (03/27/2022)   Hunger Vital Sign    Worried About Running Out of Food in the Last Year: Never true    Ran Out of Food in the Last Year: Never true  Transportation Needs: No Transportation Needs (03/27/2022)   PRAPARE - Administrator, Civil Service (Medical): No    Lack of Transportation (Non-Medical): No  Physical Activity: Sufficiently Active (03/27/2022)   Exercise Vital Sign    Days of Exercise per Week: 7 days    Minutes of Exercise per Session: 60 min  Stress: No Stress Concern Present (03/27/2022)   [employer] of Occupational Health - Occupational Stress Questionnaire    Feeling of Stress : Not at all  Social Connections: Moderately Integrated (03/27/2022)   Social Connection and Isolation Panel [NHANES]    Frequency of Communication with Friends and Family: Three times a week    Frequency of Social Gatherings with Friends and Family: Three times a week    Attends Religious Services: 1 to 4 times per year    Active Member of Clubs or Organizations: No    Attends Banker Meetings: Never    Marital Status: Married  Catering manager Violence: Not At Risk (03/27/2022)   Humiliation, Afraid, Rape, and Kick questionnaire    Fear of Current or Ex-Partner: No    Emotionally Abused: No    Physically Abused: No    Sexually Abused: No    Review of Systems: See HPI, otherwise negative ROS  Physical Exam: BP (!) 168/79 (BP Location: Right Arm, Patient Position: Sitting, Cuff Size: Large)   Pulse 67   Temp 98 F (36.7 C) (Oral)   Ht 5\' 8"  (1.727 m)   Wt 225 lb (102.1 kg)   SpO2 100%   BMI 34.21 kg/m  General:   Frail, elderly gentleman accompanied by his daughter.   Abdomen: Non-distended, normal bowel sounds.  Soft and nontender without appreciable mass or hepatosplenomegaly.   Impression/Plan: Very pleasant frail 89 year old  gentleman with a history of multiunit GI bleed found to be diverticular in origin.  Successful endoscopic treatment.  He has done well for 2 years now with ongoing anticoagulation.  Daily PPI empirically.  History of slightly enlarging AAA being surveilled elsewhere.  Recommendations:  Continue pantoprazole or Protonix 40 mg every day indefinitely.  Best taken 30 minutes before a meal-typically breakfast  Absolutely avoid all forms of NSAIDs.  No further GI evaluation needed.  There is no way to prevent a future GI bleed.  Hopefully, this will not occur.  If any recurrent rectal bleeding, please let me know immediately.  BP running high today.  Follow-up with your primary care physician.  Office visit with [place] in 1 year and as needed.    Notice: This dictation was prepared with Dragon dictation along with smaller phrase technology. Any transcriptional errors that result from this process are unintentional and may not be corrected upon review.

## 2022-09-01 ENCOUNTER — Encounter (HOSPITAL_COMMUNITY): Payer: Self-pay | Admitting: Emergency Medicine

## 2022-09-01 ENCOUNTER — Other Ambulatory Visit: Payer: Self-pay

## 2022-09-01 ENCOUNTER — Emergency Department (HOSPITAL_COMMUNITY): Payer: Medicare PPO

## 2022-09-01 ENCOUNTER — Emergency Department (HOSPITAL_COMMUNITY)
Admission: EM | Admit: 2022-09-01 | Discharge: 2022-09-01 | Disposition: A | Discharge status: Home / Self Care | Payer: Medicare PPO | Attending: Emergency Medicine | Admitting: Emergency Medicine

## 2022-09-01 DIAGNOSIS — B9689 Other specified bacterial agents as the cause of diseases classified elsewhere: Secondary | ICD-10-CM | POA: Insufficient documentation

## 2022-09-01 DIAGNOSIS — I10 Essential (primary) hypertension: Secondary | ICD-10-CM | POA: Insufficient documentation

## 2022-09-01 DIAGNOSIS — U071 COVID-19: Secondary | ICD-10-CM | POA: Insufficient documentation

## 2022-09-01 DIAGNOSIS — Z7901 Long term (current) use of anticoagulants: Secondary | ICD-10-CM | POA: Insufficient documentation

## 2022-09-01 DIAGNOSIS — Z91040 Latex allergy status: Secondary | ICD-10-CM | POA: Insufficient documentation

## 2022-09-01 DIAGNOSIS — N390 Urinary tract infection, site not specified: Secondary | ICD-10-CM | POA: Diagnosis not present

## 2022-09-01 DIAGNOSIS — F0390 Unspecified dementia without behavioral disturbance: Secondary | ICD-10-CM | POA: Diagnosis not present

## 2022-09-01 DIAGNOSIS — E871 Hypo-osmolality and hyponatremia: Secondary | ICD-10-CM | POA: Insufficient documentation

## 2022-09-01 DIAGNOSIS — R531 Weakness: Secondary | ICD-10-CM | POA: Diagnosis not present

## 2022-09-01 DIAGNOSIS — Z79899 Other long term (current) drug therapy: Secondary | ICD-10-CM | POA: Diagnosis not present

## 2022-09-01 DIAGNOSIS — R509 Fever, unspecified: Secondary | ICD-10-CM | POA: Diagnosis present

## 2022-09-01 LAB — CBC WITH DIFFERENTIAL/PLATELET
Abs Immature Granulocytes: 0.01 10*3/uL (ref 0.00–0.07)
Basophils Absolute: 0 10*3/uL (ref 0.0–0.1)
Basophils Relative: 0 %
Eosinophils Absolute: 0 10*3/uL (ref 0.0–0.5)
Eosinophils Relative: 0 %
HCT: 36.1 % — ABNORMAL LOW (ref 39.0–52.0)
Hemoglobin: 11.7 g/dL — ABNORMAL LOW (ref 13.0–17.0)
Immature Granulocytes: 0 %
Lymphocytes Relative: 22 %
Lymphs Abs: 1 10*3/uL (ref 0.7–4.0)
MCH: 30.5 pg (ref 26.0–34.0)
MCHC: 32.4 g/dL (ref 30.0–36.0)
MCV: 94 fL (ref 80.0–100.0)
Monocytes Absolute: 0.8 10*3/uL (ref 0.1–1.0)
Monocytes Relative: 18 %
Neutro Abs: 2.7 10*3/uL (ref 1.7–7.7)
Neutrophils Relative %: 60 %
Platelets: 135 10*3/uL — ABNORMAL LOW (ref 150–400)
RBC: 3.84 MIL/uL — ABNORMAL LOW (ref 4.22–5.81)
RDW: 14.1 % (ref 11.5–15.5)
WBC: 4.6 10*3/uL (ref 4.0–10.5)
nRBC: 0 % (ref 0.0–0.2)

## 2022-09-01 LAB — PROTIME-INR
INR: 1.8 — ABNORMAL HIGH (ref 0.8–1.2)
Prothrombin Time: 21 seconds — ABNORMAL HIGH (ref 11.4–15.2)

## 2022-09-01 LAB — URINALYSIS, W/ REFLEX TO CULTURE (INFECTION SUSPECTED)
Bilirubin Urine: NEGATIVE
Glucose, UA: NEGATIVE mg/dL
Hgb urine dipstick: NEGATIVE
Ketones, ur: NEGATIVE mg/dL
Nitrite: NEGATIVE
Protein, ur: 100 mg/dL — AB
Specific Gravity, Urine: 1.024 (ref 1.005–1.030)
pH: 7 (ref 5.0–8.0)

## 2022-09-01 LAB — COMPREHENSIVE METABOLIC PANEL
ALT: 11 U/L (ref 0–44)
AST: 18 U/L (ref 15–41)
Albumin: 3.6 g/dL (ref 3.5–5.0)
Alkaline Phosphatase: 50 U/L (ref 38–126)
Anion gap: 10 (ref 5–15)
BUN: 19 mg/dL (ref 8–23)
CO2: 23 mmol/L (ref 22–32)
Calcium: 8.1 mg/dL — ABNORMAL LOW (ref 8.9–10.3)
Chloride: 96 mmol/L — ABNORMAL LOW (ref 98–111)
Creatinine, Ser: 0.71 mg/dL (ref 0.61–1.24)
GFR, Estimated: >60 mL/min (ref >60)
Glucose, Bld: 101 mg/dL — ABNORMAL HIGH (ref 70–99)
Potassium: 3.4 mmol/L — ABNORMAL LOW (ref 3.5–5.1)
Sodium: 129 mmol/L — ABNORMAL LOW (ref 135–145)
Total Bilirubin: 1.2 mg/dL (ref 0.3–1.2)
Total Protein: 6.8 g/dL (ref 6.5–8.1)

## 2022-09-01 LAB — LACTIC ACID, PLASMA: Lactic Acid, Venous: 0.8 mmol/L (ref 0.5–1.9)

## 2022-09-01 LAB — SARS CORONAVIRUS 2 BY RT PCR: SARS Coronavirus 2 by RT PCR: POSITIVE — AB

## 2022-09-01 LAB — APTT: aPTT: 47 seconds — ABNORMAL HIGH (ref 24–36)

## 2022-09-01 MED ORDER — PAXLOVID (300/100) 20 X 150 MG & 10 X 100MG PO TBPK: 3.0000 | ORAL_TABLET | Freq: Two times a day (BID) | ORAL | 0 refills | Status: AC

## 2022-09-01 MED ORDER — ACETAMINOPHEN 325 MG PO TABS
650.0000 mg | ORAL_TABLET | Freq: Once | ORAL | Status: AC | PRN
  Administered 2022-09-01: 650 mg via ORAL
  Filled 2022-09-01: qty 2

## 2022-09-01 MED ORDER — LACTATED RINGERS IV BOLUS
500.0000 mL | Freq: Once | INTRAVENOUS | Status: AC
  Administered 2022-09-01: 500 mL via INTRAVENOUS

## 2022-09-01 MED ORDER — CEPHALEXIN 500 MG PO CAPS: 500.0000 mg | ORAL_CAPSULE | Freq: Three times a day (TID) | ORAL | 0 refills | Status: DC

## 2022-09-01 NOTE — ED Provider Notes (Signed)
[place] EMERGENCY DEPARTMENT AT [facility] Provider Note   CSN: [id number] Arrival date & time: 09/01/22  1046     History  Chief Complaint  Patient presents with   Generalized Body Aches   Cough   Urinary Frequency    Tristan Lopez is a 89 y.o. male.  Level 5 caveat secondary to dementia.  He has a history of A-fib, abdominal aortic aneurysm, on blood thinners (warfarin) hypertension.  He lives at home has caregivers and family are with him often.  Daughter is primarily giving history.  He had some chest congestion and fatigue over the last few days.  Tested positive for COVID today.  They talked to primary care doctor who recommended he come to the emergency department for further evaluation.  Had a temp of 101 oral on arrival here.  He is not sure why he is here and has no complaints.  No vomiting or diarrhea.  Has a chronic Foley, concern for foul smelling urine, worsening confusion from baseline  The history is provided by the patient and a relative.  Fever Max temp prior to arrival:  101 Chronicity:  New Relieved by:  Nothing Worsened by:  Nothing Ineffective treatments:  None tried Associated symptoms: confusion, cough and myalgias   Associated symptoms: no chest pain and no diarrhea        Home Medications Prior to Admission medications   Medication Sig Start Date End Date Taking? Authorizing Provider  amLODipine (NORVASC) 5 MG tablet Take 1 tablet (5 mg total) by mouth daily. 12/28/21   Billie Lade, MD  Ascorbic Acid (VITAMIN C) 1000 MG tablet Take 1,000 mg by mouth daily.    [provider]  benazepril (LOTENSIN) 40 MG tablet TAKE 1 TABLET (40 MG TOTAL) BY MOUTH DAILY. 03/05/22   Strader, Lennart Pall, PA-C  Cholecalciferol (VITAMIN D-3) 1000 units CAPS Take 2,000 Units by mouth daily.    [provider]  FEROSUL 325 (65 Fe) MG tablet TAKE ONE TABLET BY MOUTH EVERY DAY 08/03/22   Billie Lade, MD  furosemide (LASIX) 20 MG tablet  TAKE 1 ABLET BY MOUTH DAILY AS NEEDED FOR LEG SWELLING 07/02/22   Billie Lade, MD  Garlic 1000 MG CAPS Take 1,000 mg by mouth daily.    [provider]  mirabegron ER (MYRBETRIQ) 25 MG TB24 tablet Take 1 tablet (25 mg total) by mouth daily. 05/11/22   McKenzie, Mardene Celeste, MD  pantoprazole (PROTONIX) 40 MG tablet TAKE ONE TABLET BY MOUTH EVERY DAY 03/05/22   Billie Lade, MD  potassium citrate (UROCIT-K) 10 MEQ (1080 MG) SR tablet Take 1 tablet (10 mEq total) by mouth daily. 03/27/22   Billie Lade, MD  Probiotic Product (DIGESTIVE ADVANTAGE PO) Take by mouth 2 (two) times daily.     [provider]  propranolol (INDERAL) 20 MG tablet TAKE 1 TABLET (20 MG TOTAL) BY MOUTH DAILY. 05/07/22   Strader, Lennart Pall, PA-C  rosuvastatin (CRESTOR) 10 MG tablet TAKE 1 TABLET (10 MG TOTAL) BY MOUTH DAILY. 03/05/22   Strader, Lennart Pall, PA-C  tadalafil (CIALIS) 5 MG tablet TAKE ONE TABLET BY MOUTH EVERY DAY 12/29/21   Billie Lade, MD  warfarin (COUMADIN) 5 MG tablet TAKE 1 TABLET (5 MG TOTAL) BY MOUTH DAILY. 04/10/22   Billie Lade, MD      Allergies    Latex    Review of Systems   Review of Systems  Constitutional:  Positive for  fever.  Respiratory:  Positive for cough.   Cardiovascular:  Negative for chest pain.  Gastrointestinal:  Negative for diarrhea.  Musculoskeletal:  Positive for myalgias.  Psychiatric/Behavioral:  Positive for confusion.     Physical Exam Updated Vital Signs BP (!) 157/78 (BP Location: Right Arm)   Pulse 76   Temp (!) 101 F (38.3 C) (Oral)   Resp 18   Ht 5\' 8"  (1.727 m)   Wt 102 kg   SpO2 99%   BMI 34.19 kg/m  Physical Exam Vitals and nursing note reviewed.  Constitutional:      General: He is not in acute distress.    Appearance: Normal appearance. He is well-developed.  HENT:     Head: Normocephalic and atraumatic.  Eyes:     Conjunctiva/sclera: Conjunctivae normal.  Cardiovascular:     Rate and Rhythm: Normal rate and  regular rhythm.     Heart sounds: No murmur heard. Pulmonary:     Effort: Pulmonary effort is normal. No respiratory distress.     Breath sounds: Normal breath sounds.  Abdominal:     Palpations: Abdomen is soft.     Tenderness: There is no abdominal tenderness. There is no guarding or rebound.  Musculoskeletal:        General: No deformity. Normal range of motion.     Cervical back: Neck supple.  Skin:    General: Skin is warm and dry.     Capillary Refill: Capillary refill takes less than 2 seconds.  Neurological:     General: No focal deficit present.     Mental Status: He is alert. He is disoriented.     Motor: No weakness.     ED Results / Procedures / Treatments   Labs (all labs ordered are listed, but only abnormal results are displayed) Labs Reviewed  SARS CORONAVIRUS 2 BY RT PCR - Abnormal; Notable for the following components:      Result Value   SARS Coronavirus 2 by RT PCR POSITIVE (*)    All other components within normal limits  COMPREHENSIVE METABOLIC PANEL - Abnormal; Notable for the following components:   Sodium 129 (*)    Potassium 3.4 (*)    Chloride 96 (*)    Glucose, Bld 101 (*)    Calcium 8.1 (*)    All other components within normal limits  CBC WITH DIFFERENTIAL/PLATELET - Abnormal; Notable for the following components:   RBC 3.84 (*)    Hemoglobin 11.7 (*)    HCT 36.1 (*)    Platelets 135 (*)    All other components within normal limits  PROTIME-INR - Abnormal; Notable for the following components:   Prothrombin Time 21.0 (*)    INR 1.8 (*)    All other components within normal limits  APTT - Abnormal; Notable for the following components:   aPTT 47 (*)    All other components within normal limits  URINALYSIS, W/ REFLEX TO CULTURE (INFECTION SUSPECTED) - Abnormal; Notable for the following components:   Color, Urine AMBER (*)    APPearance CLOUDY (*)    Protein, ur 100 (*)    Leukocytes,Ua TRACE (*)    Bacteria, UA RARE (*)    Non  Squamous Epithelial 0-5 (*)    All other components within normal limits  CULTURE, BLOOD (ROUTINE X 2)  CULTURE, BLOOD (ROUTINE X 2)  URINE CULTURE  LACTIC ACID, PLASMA    EKG EKG Interpretation Date/Time:  Saturday September 01 2022 13:59:07 EDT Ventricular Rate:  69 PR Interval:    QRS Duration:  88 QT Interval:  396 QTC Calculation: 425 R Axis:   37  Text Interpretation: Atrial fibrillation Multiple ventricular premature complexes Low voltage, precordial leads increased ectopy since prior 8/23 Confirmed by Meridee Score [id number]) on 09/01/2022 3:37:06 PM  Radiology DG Chest Port 1 View  Result Date: 09/01/2022 CLINICAL DATA:  Questionable sepsis.  Evaluate for abnormality. EXAM: PORTABLE CHEST 1 VIEW COMPARISON:  One-view chest x-ray 08/30/2021. FINDINGS: The heart is enlarged. Atherosclerotic calcifications are present at the aortic arch. Lungs are clear. Lung volumes are low. The visualized soft tissues and bony thorax are unremarkable. IMPRESSION: 1. Cardiomegaly without failure. 2. No acute cardiopulmonary disease. Electronically Signed   By: Marin Roberts M.D.   On: 09/01/2022 15:12    Procedures Procedures    Medications Ordered in ED Medications  acetaminophen (TYLENOL) tablet 650 mg (650 mg Oral Given 09/01/22 1239)  lactated ringers bolus 500 mL (0 mLs Intravenous Stopped 09/01/22 1701)    ED Course/ Medical Decision Making/ A&P Clinical Course as of 09/01/22 1723  Sat Sep 01, 2022  1506 Chest x-ray interpreted by me as cardiomegaly no clear infiltrate.  Awaiting radiology reading. [MB]  1524 Had a long discussion with the patient's daughter regarding his workup and results.  She did not feel he needed to be admitted to the hospital, thought that would probably be worse for his confusion.  She would like him to be on antibiotics in case the urine is because of this.  She would consider the Paxlovid but wants to hold off for now.  I reviewed pharmacy recommendations  that it may affect his INR and he would need to stop his statin. [MB]    Clinical Course User Index [MB] Terrilee Files, MD                                 Medical Decision Making Amount and/or Complexity of Data Reviewed Labs: ordered. Radiology: ordered. ECG/medicine tests: ordered.  Risk OTC drugs. Prescription drug management.   This patient complains of fever lethargy weakness; this involves an extensive number of treatment Options and is a complaint that carries with it a high risk of complications and morbidity. The differential includes COVID, flu, dehydration, UTI, pneumonia, metabolic derangement  I ordered, reviewed and interpreted labs, which included CBC with normal white count, stable low hemoglobin, stable low platelets, chemistries with no low sodium, urinalysis possible signs of infection sent for culture, blood culture sent, lactate normal, COVID-positive, INR slightly subtherapeutic I ordered medication IV fluids oral Tylenol and reviewed PMP when indicated. I ordered imaging studies which included chest x-ray and I independently    visualized and interpreted imaging which showed no acute infiltrate Additional history obtained from patient's daughter Previous records obtained and reviewed in epic including recent GI and urology notes Cardiac monitoring reviewed, atrial fibrillation with frequent PVCs Social determinants considered, no significant barriers Critical Interventions: None  After the interventions stated above, I reevaluated the patient and found patient to be hemodynamically stable Admission and further testing considered, he would possibly benefit from admission.  Daughter felt that he would be better from an agitation standpoint at home.  Will cover with antibiotics for possible urinary tract infection.  We also discussed pros and cons of Paxil bid and she wants to review this with her family first.  Recommended close follow-up with PCP and return  instructions discussed  Final Clinical Impression(s) / ED Diagnoses Final diagnoses:  COVID-19 virus infection  Lower urinary tract infectious disease  General weakness  Hyponatremia    Rx / DC Orders ED Discharge Orders          Ordered    cephALEXin (KEFLEX) 500 MG capsule  3 times daily        09/01/22 1527    nirmatrelvir & ritonavir (PAXLOVID, 300/100,) 20 x 150 MG & 10 x 100MG  TBPK  2 times daily        09/01/22 1528              Terrilee Files, MD 09/01/22 1726

## 2022-09-01 NOTE — ED Triage Notes (Signed)
Pt tested + for COVID this morning. Pt was having body aches, congestion and fever at home. Foul odor urine. Pt has a chronic foley catheter.

## 2022-09-01 NOTE — Discharge Instructions (Addendum)
You were seen in the emergency department for increased weakness and fever.  You tested positive for COVID.  You also had possible signs of urinary tract infection.  We discussed admission of the hospital versus home.  We are prescribing you an antibiotic.  We also discussed Paxlovid for the COVID infection.  If you do start the Paxlovid you will need to closely monitor the warfarin level.  You also cannot be taking your cholesterol medicine (rosuvastatin).  Please rest drink plenty of fluids.  Tylenol for fever control.  Follow-up with your regular doctor.  Return to the emergency department if any worsening or concerning symptoms.

## 2022-09-03 ENCOUNTER — Telehealth: Payer: Self-pay | Admitting: Internal Medicine

## 2022-09-03 LAB — URINE CULTURE

## 2022-09-03 NOTE — Telephone Encounter (Signed)
Daughter advised.

## 2022-09-03 NOTE — Telephone Encounter (Signed)
FYI< patient went to ER for COVID + with runny nose and fever. Currently taking tylenol and mucinex to thin mucus if fever continue what does patient need to do. Still waiting on Culture for his UTI.

## 2022-09-04 ENCOUNTER — Other Ambulatory Visit: Payer: Self-pay | Admitting: Internal Medicine

## 2022-09-06 LAB — CULTURE, BLOOD (ROUTINE X 2)
Culture: NO GROWTH
Culture: NO GROWTH
Special Requests: ADEQUATE
Special Requests: ADEQUATE

## 2022-09-12 ENCOUNTER — Ambulatory Visit (INDEPENDENT_AMBULATORY_CARE_PROVIDER_SITE_OTHER): Payer: Medicare PPO

## 2022-09-12 DIAGNOSIS — R339 Retention of urine, unspecified: Secondary | ICD-10-CM

## 2022-09-12 NOTE — Addendum Note (Signed)
Addended by: Christoper Fabian R on: 09/12/2022 02:52 PM   Modules accepted: Level of Service

## 2022-09-12 NOTE — Progress Notes (Signed)
Cath Change/ Replacement  Patient is present today for a catheter change due to urinary retention.  10ml of water was removed from the balloon, a 18FR foley cath was removed without difficulty.  Patient was cleaned and prepped in a sterile fashion with betadine and 2% lidocaine jelly was instilled into the urethra. A 18 FR foley cath was replaced into the bladder, no complications were noted. Urine return was noted 5ml and urine was yellow in color. The balloon was filled with 10ml of sterile water. A leg bag was attached for drainage.  A night bag was also given to the patient and patient was given instruction on how to change from one bag to another. Patient was given proper instruction on catheter care.    Performed by: Kennyth Lose, CMA  Follow up: No follow-ups on file.

## 2022-09-13 ENCOUNTER — Other Ambulatory Visit: Payer: Self-pay | Admitting: Cardiology

## 2022-09-13 LAB — PROTIME-INR: INR: 3.2 — AB (ref 0.80–1.20)

## 2022-09-17 ENCOUNTER — Ambulatory Visit: Payer: Self-pay | Admitting: *Deleted

## 2022-09-17 ENCOUNTER — Encounter: Payer: Self-pay | Admitting: *Deleted

## 2022-09-17 DIAGNOSIS — Z5181 Encounter for therapeutic drug level monitoring: Secondary | ICD-10-CM | POA: Diagnosis not present

## 2022-09-17 DIAGNOSIS — I4891 Unspecified atrial fibrillation: Secondary | ICD-10-CM

## 2022-09-17 LAB — PROTIME-INR
INR: 3.2 — ABNORMAL HIGH (ref 0.9–1.2)
Prothrombin Time: 32.9 s — ABNORMAL HIGH (ref 9.1–12.0)

## 2022-09-17 NOTE — Patient Instructions (Signed)
Spoke with [name], pt's son and advised pt to continue warfarin 5 mg daily except 2.5mg  on Sundays .  Has had Covid and been on Abx for UTI.  Eat extra greens. Recheck INR on 10/15/22  Pt gets labs done at [facility] in [place]. Son [name] manages pts medicine. Lab orders mailed to [address] [place], [place] [address]

## 2022-10-02 ENCOUNTER — Ambulatory Visit: Payer: Medicare PPO | Admitting: Internal Medicine

## 2022-10-04 DIAGNOSIS — Z7901 Long term (current) use of anticoagulants: Secondary | ICD-10-CM | POA: Diagnosis not present

## 2022-10-04 DIAGNOSIS — R32 Unspecified urinary incontinence: Secondary | ICD-10-CM | POA: Diagnosis not present

## 2022-10-04 DIAGNOSIS — G3184 Mild cognitive impairment, so stated: Secondary | ICD-10-CM | POA: Diagnosis not present

## 2022-10-04 DIAGNOSIS — I719 Aortic aneurysm of unspecified site, without rupture: Secondary | ICD-10-CM | POA: Diagnosis not present

## 2022-10-04 DIAGNOSIS — Z87440 Personal history of urinary (tract) infections: Secondary | ICD-10-CM | POA: Diagnosis not present

## 2022-10-04 DIAGNOSIS — E669 Obesity, unspecified: Secondary | ICD-10-CM | POA: Diagnosis not present

## 2022-10-04 DIAGNOSIS — N400 Enlarged prostate without lower urinary tract symptoms: Secondary | ICD-10-CM | POA: Diagnosis not present

## 2022-10-04 DIAGNOSIS — K219 Gastro-esophageal reflux disease without esophagitis: Secondary | ICD-10-CM | POA: Diagnosis not present

## 2022-10-04 DIAGNOSIS — M1990 Unspecified osteoarthritis, unspecified site: Secondary | ICD-10-CM | POA: Diagnosis not present

## 2022-10-04 DIAGNOSIS — I2510 Atherosclerotic heart disease of native coronary artery without angina pectoris: Secondary | ICD-10-CM | POA: Diagnosis not present

## 2022-10-04 DIAGNOSIS — H547 Unspecified visual loss: Secondary | ICD-10-CM | POA: Diagnosis not present

## 2022-10-04 DIAGNOSIS — M545 Low back pain, unspecified: Secondary | ICD-10-CM | POA: Diagnosis not present

## 2022-10-04 DIAGNOSIS — E785 Hyperlipidemia, unspecified: Secondary | ICD-10-CM | POA: Diagnosis not present

## 2022-10-04 DIAGNOSIS — Z6831 Body mass index (BMI) 31.0-31.9, adult: Secondary | ICD-10-CM | POA: Diagnosis not present

## 2022-10-04 DIAGNOSIS — M353 Polymyalgia rheumatica: Secondary | ICD-10-CM | POA: Diagnosis not present

## 2022-10-04 DIAGNOSIS — I4891 Unspecified atrial fibrillation: Secondary | ICD-10-CM | POA: Diagnosis not present

## 2022-10-10 ENCOUNTER — Encounter: Payer: Self-pay | Admitting: Internal Medicine

## 2022-10-11 ENCOUNTER — Encounter: Payer: Self-pay | Admitting: Cardiology

## 2022-10-11 ENCOUNTER — Ambulatory Visit (INDEPENDENT_AMBULATORY_CARE_PROVIDER_SITE_OTHER): Payer: Medicare PPO

## 2022-10-11 ENCOUNTER — Ambulatory Visit: Payer: Medicare PPO | Admitting: Internal Medicine

## 2022-10-11 ENCOUNTER — Encounter: Payer: Self-pay | Admitting: Internal Medicine

## 2022-10-11 ENCOUNTER — Ambulatory Visit: Payer: Medicare PPO | Attending: Cardiology | Admitting: Cardiology

## 2022-10-11 VITALS — BP 126/62 | HR 94 | Ht 69.0 in | Wt 228.2 lb

## 2022-10-11 VITALS — BP 107/70 | HR 86 | Ht 69.0 in | Wt 228.6 lb

## 2022-10-11 DIAGNOSIS — I4891 Unspecified atrial fibrillation: Secondary | ICD-10-CM | POA: Diagnosis not present

## 2022-10-11 DIAGNOSIS — I2510 Atherosclerotic heart disease of native coronary artery without angina pectoris: Secondary | ICD-10-CM

## 2022-10-11 DIAGNOSIS — F419 Anxiety disorder, unspecified: Secondary | ICD-10-CM | POA: Diagnosis not present

## 2022-10-11 DIAGNOSIS — E785 Hyperlipidemia, unspecified: Secondary | ICD-10-CM

## 2022-10-11 DIAGNOSIS — I6523 Occlusion and stenosis of bilateral carotid arteries: Secondary | ICD-10-CM

## 2022-10-11 DIAGNOSIS — Z23 Encounter for immunization: Secondary | ICD-10-CM | POA: Diagnosis not present

## 2022-10-11 DIAGNOSIS — K219 Gastro-esophageal reflux disease without esophagitis: Secondary | ICD-10-CM

## 2022-10-11 DIAGNOSIS — R339 Retention of urine, unspecified: Secondary | ICD-10-CM | POA: Diagnosis not present

## 2022-10-11 DIAGNOSIS — E876 Hypokalemia: Secondary | ICD-10-CM

## 2022-10-11 DIAGNOSIS — I7143 Infrarenal abdominal aortic aneurysm, without rupture: Secondary | ICD-10-CM

## 2022-10-11 DIAGNOSIS — I10 Essential (primary) hypertension: Secondary | ICD-10-CM | POA: Diagnosis not present

## 2022-10-11 DIAGNOSIS — I4821 Permanent atrial fibrillation: Secondary | ICD-10-CM | POA: Diagnosis not present

## 2022-10-11 MED ORDER — HYDROXYZINE PAMOATE 25 MG PO CAPS: 25.0000 mg | ORAL_CAPSULE | Freq: Three times a day (TID) | ORAL | 0 refills | Status: DC | PRN

## 2022-10-11 MED ORDER — CIPROFLOXACIN HCL 500 MG PO TABS
500.0000 mg | ORAL_TABLET | Freq: Once | ORAL | Status: AC
Start: 2022-10-11 — End: 2022-10-11
  Administered 2022-10-11: 500 mg via ORAL

## 2022-10-11 NOTE — Patient Instructions (Signed)
It was a pleasure to see you today.  Thank you for giving [place] the opportunity to be involved in your care.  Below is a brief recap of your visit and next steps.  We will plan to see you again in 6 months.  Summary Add hydroxyzine for as needed anxiety relief Repeat cholesterol panel and check potassium level Flu shot today Follow up in 6 months

## 2022-10-11 NOTE — Progress Notes (Signed)
Cath Change/ Replacement  Patient is present today for a catheter change due to urinary retention.  10ml of water was removed from the balloon, a 18FR foley cath was removed without difficulty.  Patient was cleaned and prepped in a sterile fashion with betadine.  A 18 FR foley cath was replaced into the bladder, no complications were noted. Urine return was noted 10ml and urine was yellow in color. The balloon was filled with 10ml of sterile water. A leg bag was attached for drainage. Patient was given proper instruction on catheter care.    Performed by: Eily Louvier LPN  Follow up: keep scheduled [place]

## 2022-10-11 NOTE — Progress Notes (Signed)
Established Patient Office Visit  Subjective   Patient ID: Tristan Lopez, male    DOB: [birth year]  Age: 89 y.o. MRN: [id number]  Chief Complaint  Patient presents with   Hypertension    Follow up    Anxiety    Son is concerned for anxiety   [name] return to care today for routine follow-up.  He was last evaluated by me on 3/5 for his annual exam.  No medication changes were made at that time and 71-month follow-up was arranged.  In the interim, he has been evaluated by cardiology, urology, and gastroenterology for follow-up.  ED presentation 8/10 in the setting of COVID-19.  There have otherwise been no acute interval events.  [name] reports feeling well today.  He is asymptomatic and has no acute concerns to discuss.  He is accompanied by his son, [name], who expresses concern over recent anxiety that his father has displayed.  He states that his father becomes agitated in the afternoons.  This does not occur every day, but 1-2 times per week.  He is interested in an as needed medication for anxiety relief.  [name] endorses occasional anxiety, but is not chiefly concerned about his recent symptoms.  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 [name]   COLONOSCOPY  11/01/11    Dr. Jena Gauss- tubular adenoma,suboptimal preparation, internal hemorrhoids, o/w normal rectum. pancolonic diverticulosis   COLONOSCOPY WITH PROPOFOL N/A 11/29/2020   Procedure: COLONOSCOPY WITH PROPOFOL;  Surgeon: Dolores Frame, MD;  Location: [facility];  Service: Gastroenterology;  Laterality: N/A;   ESOPHAGOGASTRODUODENOSCOPY  11/01/11   Dr. Jena Gauss- erosive reflux esophagitis along with a patulous EG junction, hialtal hernia, antral erosions with possible area of healing ulceration- s/p bx= chronic erosive gastritis, no malignancy   ESOPHAGOGASTRODUODENOSCOPY (EGD) WITH PROPOFOL N/A 11/29/2020   Procedure: ESOPHAGOGASTRODUODENOSCOPY (EGD) WITH PROPOFOL;  Surgeon: Dolores Frame, MD;  Location: [facility];  Service: Gastroenterology;  Laterality: N/A;   HERNIA REPAIR     x2, bilateral inguinal    KIDNEY STONE SURGERY     KNEE ARTHROSCOPY     X2   NASAL ENDOSCOPY     Social History   Tobacco Use   Smoking status: Former    Current packs/day: 0.00    Types: Cigarettes    Quit date: 1992    Years since quitting: 32.7   Smokeless tobacco: Never  Vaping Use   Vaping status: Never Used  Substance Use Topics   Alcohol use: No   Drug use: No   Family History  Problem Relation Age of Onset   Breast cancer Mother  Other Mother        Essential tremor   Stroke Mother    Heart disease Father        Pacemaker   Heart attack Maternal Grandfather    Renal cancer Son    Colon cancer Neg Hx    Liver disease Neg Hx    Prostate cancer Neg Hx    Bladder Cancer Neg Hx    Kidney cancer Neg Hx    Allergies  Allergen Reactions   Latex Rash   Review of Systems  Constitutional:  Negative for chills and fever.  HENT:  Negative for sore throat.   Respiratory:  Negative for cough and shortness of breath.   Cardiovascular:  Negative for chest pain, palpitations and leg swelling.  Gastrointestinal:  Negative for abdominal pain, blood in stool, constipation,  diarrhea, nausea and vomiting.  Genitourinary:  Negative for dysuria and hematuria.  Musculoskeletal:  Negative for myalgias.  Skin:  Negative for itching and rash.  Neurological:  Negative for dizziness and headaches.  Psychiatric/Behavioral:  Negative for depression and suicidal ideas.      Objective:     BP 107/70 (BP Location: Left Arm, Patient Position: Sitting, Cuff Size: Large)   Pulse 86   Ht 5\' 9"  (1.753 m)   Wt 228 lb 9.6 oz (103.7 kg)   SpO2 96%   BMI 33.76 kg/m  BP Readings from Last 3 Encounters:  10/11/22 107/70  10/11/22 126/62  09/01/22 116/87   Physical Exam Vitals reviewed.  Constitutional:      General: He is not in acute distress.    Appearance: Normal appearance. He is not ill-appearing.  HENT:     Head: Normocephalic and atraumatic.     Right Ear: External ear normal.     Left Ear: External ear normal.     Nose: Nose normal. No congestion or rhinorrhea.     Mouth/Throat:     Mouth: Mucous membranes are moist.     Pharynx: Oropharynx is clear.  Eyes:     General: No scleral icterus.    Extraocular Movements: Extraocular movements intact.     Conjunctiva/sclera: Conjunctivae normal.     Pupils: Pupils are equal, round, and reactive to light.  Cardiovascular:     Rate and Rhythm: Normal rate. Rhythm irregular.     Pulses: Normal pulses.     Heart sounds: Normal heart sounds. No murmur heard. Pulmonary:     Effort: Pulmonary effort is normal.     Breath sounds: Normal breath sounds. No wheezing, rhonchi or rales.  Abdominal:     General: Abdomen is flat. Bowel sounds are normal. There is no distension.     Palpations: Abdomen is soft.     Tenderness: There is no abdominal tenderness.  Musculoskeletal:        General: No swelling or deformity. Normal range of motion.     Cervical back: Normal range of motion.  Skin:    General: Skin is warm and dry.     Capillary Refill: Capillary refill takes less than 2 seconds.  Neurological:     General:  No focal deficit present.     Mental Status: He is alert and oriented to person, place, and time.     Motor: No weakness.     Gait: Gait abnormal.  Psychiatric:        Mood and Affect: Mood normal.        Behavior: Behavior normal.        Thought Content:  Thought content normal.   Last CBC Lab Results  Component Value Date   WBC 4.6 09/01/2022   HGB 11.7 (L) 09/01/2022   HCT 36.1 (L) 09/01/2022   MCV 94.0 09/01/2022   MCH 30.5 09/01/2022   RDW 14.1 09/01/2022   PLT 135 (L) 09/01/2022   Last metabolic panel Lab Results  Component Value Date   GLUCOSE 101 (H) 09/01/2022   NA 129 (L) 09/01/2022   K 3.4 (L) 09/01/2022   CL 96 (L) 09/01/2022   CO2 23 09/01/2022   BUN 19 09/01/2022   CREATININE 0.71 09/01/2022   GFRNONAA >60 09/01/2022   CALCIUM 8.1 (L) 09/01/2022   PHOS 2.5 11/29/2020   PROT 6.8 09/01/2022   ALBUMIN 3.6 09/01/2022   LABGLOB 2.4 03/27/2022   AGRATIO 1.7 03/27/2022   BILITOT 1.2 09/01/2022   ALKPHOS 50 09/01/2022   AST 18 09/01/2022   ALT 11 09/01/2022   ANIONGAP 10 09/01/2022   Last lipids Lab Results  Component Value Date   CHOL 134 10/04/2021   HDL 52 10/04/2021   LDLCALC 69 10/04/2021   TRIG 63 10/04/2021   CHOLHDL 2.6 10/04/2021   Last hemoglobin A1c Lab Results  Component Value Date   HGBA1C 5.4 03/27/2022   Last thyroid functions Lab Results  Component Value Date   TSH 1.610 03/27/2022   Last vitamin D Lab Results  Component Value Date   VD25OH 40.9 03/27/2022   Last vitamin B12 and Folate Lab Results  Component Value Date   VITAMINB12 298 03/27/2022   FOLATE 5.2 03/27/2022     Assessment & Plan:   Problem List Items Addressed This Visit       Atrial fibrillation (HCC)    Irregularly irregular rate and rhythm detected on exam today.  Seen earlier today by cardiology for follow-up.  He remains on propranolol and warfarin.      Essential hypertension    Remains adequately controlled on current antihypertensive regimen.   No medication changes are indicated today.      GERD (gastroesophageal reflux disease)    Symptoms remain adequately controlled with Protonix 40 mg daily.  No medication changes are indicated today.      Hyperlipidemia    He is currently prescribed rosuvastatin 10 mg daily.  Lipid panel last updated in September 2023.  Repeat lipid panel ordered today.      Hypokalemia    K+ 3.4 on labs from last month.  Repeat BMP ordered today.      Need for immunization against influenza    Influenza vaccine administered today      Anxiety - Primary    He is accompanied by his son today, who is concerned for increasing anxiety and irritability 1-2 times per week.  He is interested in an as needed medication for anxiety relief.  Patient is not chiefly concerned about his symptoms, but does endorse that anxiety has been worse recently. -Through shared decision making, hydroxyzine 25 mg as needed for anxiety relief has been prescribed today      Return in about 6 months (around 04/10/2023).   Billie Lade, MD

## 2022-10-11 NOTE — Assessment & Plan Note (Signed)
Remains adequately controlled on current antihypertensive regimen.  No medication changes are indicated today.

## 2022-10-11 NOTE — Assessment & Plan Note (Signed)
He is currently prescribed rosuvastatin 10 mg daily.  Lipid panel last updated in September 2023.  Repeat lipid panel ordered today.

## 2022-10-11 NOTE — Progress Notes (Signed)
Cardiology Office Note  Date: 10/11/2022   ID: Tristan Lopez, DOB [birth year], MRN [id number]  History of Present Illness: Tristan Lopez is an 89 y.o. male last seen in March.  He is here today with family member for a follow-up visit.  Reports no major change in status, ambulates with a walker, no recent falls.  He does not report any active sense of palpitations, no exertional chest pain.  I reviewed his medications.  No changes from a cardiac perspective.  Seems to be tolerating Crestor at current dose.  Blood pressure is well-controlled today.  He is on Coumadin with follow-up in the anticoagulation clinic.  INR was 3.2 on August 22.  He does not report any spontaneous bleeding problems.  I reviewed his most recent lab work, he has pending follow-up with PCP as well.  Physical Exam: VS:  BP 126/62 (BP Location: Right Arm, Patient Position: Sitting, Cuff Size: Large)   Pulse 94   Ht 5\' 9"  (1.753 m)   Wt 228 lb 3.2 oz (103.5 kg)   SpO2 99%   BMI 33.70 kg/m , BMI Body mass index is 33.7 kg/m.  Wt Readings from Last 3 Encounters:  10/11/22 228 lb 3.2 oz (103.5 kg)  09/01/22 224 lb 13.9 oz (102 kg)  08/28/22 225 lb (102.1 kg)    General: Patient appears comfortable at rest. HEENT: Conjunctiva and lids normal. Neck: Supple, no elevated JVP or carotid bruits. Lungs: Clear to auscultation, nonlabored breathing at rest. Cardiac: Irregularly irregular, 1/6 systolic murmur, no gallop. Extremities: No pitting edema.  ECG:  An ECG dated 09/01/2022 was personally reviewed today and demonstrated:  Real fibrillation with PVCs.  Borderline low voltage.  Labwork: 03/27/2022: TSH 1.610 09/01/2022: ALT 11; AST 18; BUN 19; Creatinine, Ser 0.71; Hemoglobin 11.7; Platelets 135; Potassium 3.4; Sodium 129     Component Value Date/Time   CHOL 134 10/04/2021 1044   TRIG 63 10/04/2021 1044   HDL 52 10/04/2021 1044   CHOLHDL 2.6 10/04/2021 1044   CHOLHDL 3.4 07/28/2019 1132   VLDL 17  02/17/2013 0820   LDLCALC 69 10/04/2021 1044   LDLCALC 84 07/28/2019 1132   Other Studies Reviewed Today:  CT abdomen 02/15/2022: IMPRESSION: 1. Vascular calcification versus nonobstructing bilateral renal calculi. No hydronephrosis. 2. Diffusely thickened and trabeculated appearance of the bladder wall likely related to chronic bladder outlet obstruction. Correlation with urinalysis recommended to exclude cystitis. 3. Colonic diverticulosis.  No bowel obstruction. Normal appendix. 4. A 4.4 cm infrarenal abdominal aortic aneurysm (previously 3.8 cm). Recommend follow-up every 12 months and vascular consultation. This recommendation follows ACR consensus guidelines: White Paper of the ACR Incidental Findings Committee II on Vascular Findings. J Am Coll Radiol 2013; 40:981-191. 5.  Aortic Atherosclerosis (ICD10-I70.0).   Carotid Dopplers 02/16/2022: IMPRESSION: Heterogeneous and partially calcified plaque at the bilateral carotid bifurcation contributes to 50-69% stenosis by established duplex criteria. Note that the flow velocities of the bilateral ICA were obtained from an area distal to the maximum narrowing due to the presence of anterior wall plaque with shadowing and may be underestimating the percentage of ICA stenosis. If establishing a more accurate degree of stenosis is required, cerebral angiogram should be considered, or as a second best test, CTA.  Assessment and Plan:  1.  Permanent atrial fibrillation with CHA2DS2-VASc score of 4.  He is on Coumadin for stroke prophylaxis with follow-up in the anticoagulation clinic.  No active palpitations and heart rate adequately controlled on Inderal.  No changes  were made today.   2.  Moderate bilateral carotid artery disease, asymptomatic with carotid Dopplers from January noted above.  Not on aspirin given use of Coumadin.  He is on Crestor as well.   3.  Asymptomatic infrarenal abdominal aortic aneurysm, 4.4 cm by most recent  CT imaging in January.  We will consider follow-up imaging around the time of his next visit.   4.  Multivessel coronary artery calcification by CT imaging.  No active angina.   5.  Essential hypertension.  Blood pressure well-controlled today.   6.  Mixed hyperlipidemia.  LDL 69 in September 2023.  Continue Crestor 10 mg daily.  Disposition:  Follow up  6 months.  Signed, Jonelle Sidle, M.D., F.A.C.C. [facility]

## 2022-10-11 NOTE — Patient Instructions (Signed)
Medication Instructions:  Your physician recommends that you continue on your current medications as directed. Please refer to the Current Medication list given to you today.   Labwork: None today  Testing/Procedures: None today  Follow-Up: 6 months  Any Other Special Instructions Will Be Listed Below (If Applicable).  If you need a refill on your cardiac medications before your next appointment, please call your pharmacy.  

## 2022-10-11 NOTE — Assessment & Plan Note (Signed)
Irregularly irregular rate and rhythm detected on exam today.  Seen earlier today by cardiology for follow-up.  He remains on propranolol and warfarin.

## 2022-10-11 NOTE — Patient Instructions (Signed)

## 2022-10-11 NOTE — Assessment & Plan Note (Signed)
Symptoms remain adequately controlled with Protonix 40 mg daily.  No medication changes are indicated today.

## 2022-10-11 NOTE — Assessment & Plan Note (Signed)
K+ 3.4 on labs from last month.  Repeat BMP ordered today.

## 2022-10-11 NOTE — Assessment & Plan Note (Signed)
Influenza vaccine administered today.

## 2022-10-11 NOTE — Assessment & Plan Note (Signed)
He is accompanied by his son today, who is concerned for increasing anxiety and irritability 1-2 times per week.  He is interested in an as needed medication for anxiety relief.  Patient is not chiefly concerned about his symptoms, but does endorse that anxiety has been worse recently. -Through shared decision making, hydroxyzine 25 mg as needed for anxiety relief has been prescribed today

## 2022-10-12 ENCOUNTER — Telehealth: Payer: Self-pay | Admitting: Internal Medicine

## 2022-10-12 LAB — BASIC METABOLIC PANEL
BUN/Creatinine Ratio: 17 (ref 10–24)
BUN: 12 mg/dL (ref 8–27)
CO2: 26 mmol/L (ref 20–29)
Calcium: 8.9 mg/dL (ref 8.6–10.2)
Chloride: 100 mmol/L (ref 96–106)
Creatinine, Ser: 0.72 mg/dL — ABNORMAL LOW (ref 0.76–1.27)
Glucose: 107 mg/dL — ABNORMAL HIGH (ref 70–99)
Potassium: 3.7 mmol/L (ref 3.5–5.2)
Sodium: 140 mmol/L (ref 134–144)
eGFR: 87 mL/min/{1.73_m2} (ref >59)

## 2022-10-12 LAB — LIPID PANEL
Chol/HDL Ratio: 2.8 ratio (ref 0.0–5.0)
Cholesterol, Total: 141 mg/dL (ref 100–199)
HDL: 50 mg/dL (ref >39)
LDL Chol Calc (NIH): 77 mg/dL (ref 0–99)
Triglycerides: 68 mg/dL (ref 0–149)
VLDL Cholesterol Cal: 14 mg/dL (ref 5–40)

## 2022-10-12 NOTE — Telephone Encounter (Signed)
[place] pharm called in on patient behalf  Needs a call back in regard to patients hydrOXYzine (VISTARIL) 25 MG capsule     Contraindication with Potassium

## 2022-10-12 NOTE — Telephone Encounter (Signed)
Gave the verbal ok to take from the PCP

## 2022-10-15 ENCOUNTER — Ambulatory Visit: Payer: Medicare PPO

## 2022-10-15 DIAGNOSIS — Z5181 Encounter for therapeutic drug level monitoring: Secondary | ICD-10-CM | POA: Diagnosis not present

## 2022-10-15 DIAGNOSIS — I4891 Unspecified atrial fibrillation: Secondary | ICD-10-CM | POA: Diagnosis not present

## 2022-10-16 LAB — PROTIME-INR
INR: 2 — ABNORMAL HIGH (ref 0.9–1.2)
Prothrombin Time: 20.9 s — ABNORMAL HIGH (ref 9.1–12.0)

## 2022-10-17 ENCOUNTER — Other Ambulatory Visit: Payer: Self-pay | Admitting: Internal Medicine

## 2022-11-01 ENCOUNTER — Other Ambulatory Visit: Payer: Self-pay | Admitting: Student

## 2022-11-01 ENCOUNTER — Other Ambulatory Visit: Payer: Self-pay | Admitting: Internal Medicine

## 2022-11-01 DIAGNOSIS — E876 Hypokalemia: Secondary | ICD-10-CM

## 2022-11-09 ENCOUNTER — Telehealth: Payer: Self-pay | Admitting: Cardiology

## 2022-11-09 DIAGNOSIS — I4821 Permanent atrial fibrillation: Secondary | ICD-10-CM

## 2022-11-09 DIAGNOSIS — Z5181 Encounter for therapeutic drug level monitoring: Secondary | ICD-10-CM

## 2022-11-09 NOTE — Telephone Encounter (Signed)
Patient's son is calling to speak with Tristan Lopez regarding when the patient needs his INR checked again.   Please advise.

## 2022-11-12 NOTE — Telephone Encounter (Signed)
Spoke with son, [name].  Requested INR be checked 11/19/22.  Lab order mailed to [facility] in [place].

## 2022-11-13 ENCOUNTER — Ambulatory Visit: Payer: Medicare PPO

## 2022-11-13 DIAGNOSIS — R339 Retention of urine, unspecified: Secondary | ICD-10-CM | POA: Diagnosis not present

## 2022-11-13 NOTE — Progress Notes (Signed)
Cath Change/ Replacement  Patient is present today for a catheter change due to urinary retention.  10ml of water was removed from the balloon, a 18FR foley cath was removed without difficulty.  Patient was cleaned and prepped in a sterile fashion with betadine and 2% lidocaine jelly was instilled into the urethra. A 18 FR foley cath was replaced into the bladder, no complications were noted. Urine return was noted 50ml and urine was yellow in color. The balloon was filled with 10ml of sterile water. A leg bag was attached for drainage. Patient was given proper instruction on catheter care.    Performed by: Guss Bunde, CMA  Follow up: as scheduled.

## 2022-11-21 DIAGNOSIS — I4821 Permanent atrial fibrillation: Secondary | ICD-10-CM | POA: Diagnosis not present

## 2022-11-21 DIAGNOSIS — Z5181 Encounter for therapeutic drug level monitoring: Secondary | ICD-10-CM | POA: Diagnosis not present

## 2022-11-22 ENCOUNTER — Ambulatory Visit (INDEPENDENT_AMBULATORY_CARE_PROVIDER_SITE_OTHER): Payer: Medicare PPO | Admitting: *Deleted

## 2022-11-22 DIAGNOSIS — I4891 Unspecified atrial fibrillation: Secondary | ICD-10-CM

## 2022-11-22 DIAGNOSIS — Z5181 Encounter for therapeutic drug level monitoring: Secondary | ICD-10-CM

## 2022-11-22 LAB — PROTIME-INR
INR: 2.2 — ABNORMAL HIGH (ref 0.9–1.2)
Prothrombin Time: 23.6 s — ABNORMAL HIGH (ref 9.1–12.0)

## 2022-11-22 NOTE — Patient Instructions (Signed)
Spoke with Tristan Lopez, pt's son and advised pt to continue warfarin 5 mg daily except 2.5mg  on Sundays .  Recheck INR week of 12/31/22. Pt gets labs done at [facility] in [place]. Son Tristan Lopez manages pts medicine. Lab orders mailed to [address], [place], [place] [address]

## 2022-11-29 ENCOUNTER — Other Ambulatory Visit: Payer: Self-pay | Admitting: Internal Medicine

## 2022-12-13 ENCOUNTER — Ambulatory Visit: Payer: Medicare PPO

## 2022-12-13 DIAGNOSIS — R339 Retention of urine, unspecified: Secondary | ICD-10-CM | POA: Diagnosis not present

## 2022-12-13 MED ORDER — CIPROFLOXACIN HCL 500 MG PO TABS
500.0000 mg | ORAL_TABLET | Freq: Once | ORAL | Status: AC
  Administered 2022-12-13: 500 mg via ORAL

## 2022-12-13 NOTE — Progress Notes (Signed)
Cath Change/ Replacement  Patient is present today for a catheter change due to urinary retention.  10ml of water was removed from the balloon, a 18 FR foley cath was removed without difficulty.  Patient was cleaned and prepped in a sterile fashion with betadine. A 18 FR foley cath was replaced into the bladder, no complications were noted. Urine return was noted 20ml and urine was yellow in color. The balloon was filled with 10ml of sterile water. A leg bag was attached for drainage. Patient was given proper instruction on catheter care.    Performed by: Aneka Fagerstrom LPN  Follow up: keep scheduled nv

## 2022-12-31 DIAGNOSIS — Z5181 Encounter for therapeutic drug level monitoring: Secondary | ICD-10-CM | POA: Diagnosis not present

## 2022-12-31 DIAGNOSIS — I4891 Unspecified atrial fibrillation: Secondary | ICD-10-CM | POA: Diagnosis not present

## 2023-01-01 LAB — PROTIME-INR
INR: 3.1 — ABNORMAL HIGH (ref 0.9–1.2)
Prothrombin Time: 31.4 s — ABNORMAL HIGH (ref 9.1–12.0)

## 2023-01-02 ENCOUNTER — Ambulatory Visit (INDEPENDENT_AMBULATORY_CARE_PROVIDER_SITE_OTHER): Payer: Medicare PPO | Admitting: *Deleted

## 2023-01-02 DIAGNOSIS — Z5181 Encounter for therapeutic drug level monitoring: Secondary | ICD-10-CM | POA: Diagnosis not present

## 2023-01-02 DIAGNOSIS — I4891 Unspecified atrial fibrillation: Secondary | ICD-10-CM | POA: Diagnosis not present

## 2023-01-02 NOTE — Patient Instructions (Signed)
Spoke with [name], pt's son and advised pt to continue warfarin 5 mg daily except 2.5mg  on Sundays .  Recheck INR week of 02/11/23.  Pt gets labs done at [facility] in [place]. Son [name] manages pts medicine. Lab orders mailed to [address], [place], [place] [address]

## 2023-01-03 ENCOUNTER — Other Ambulatory Visit: Payer: Self-pay | Admitting: Internal Medicine

## 2023-01-03 DIAGNOSIS — I4891 Unspecified atrial fibrillation: Secondary | ICD-10-CM

## 2023-01-06 ENCOUNTER — Other Ambulatory Visit: Payer: Self-pay

## 2023-01-06 ENCOUNTER — Emergency Department (HOSPITAL_COMMUNITY)
Admission: EM | Admit: 2023-01-06 | Discharge: 2023-01-06 | Disposition: A | Discharge status: Home / Self Care | Payer: Medicare PPO | Attending: Emergency Medicine | Admitting: Emergency Medicine

## 2023-01-06 ENCOUNTER — Emergency Department (HOSPITAL_COMMUNITY): Payer: Medicare PPO

## 2023-01-06 ENCOUNTER — Encounter (HOSPITAL_COMMUNITY): Payer: Self-pay

## 2023-01-06 DIAGNOSIS — Z7901 Long term (current) use of anticoagulants: Secondary | ICD-10-CM | POA: Diagnosis not present

## 2023-01-06 DIAGNOSIS — M545 Low back pain, unspecified: Secondary | ICD-10-CM | POA: Diagnosis not present

## 2023-01-06 DIAGNOSIS — R160 Hepatomegaly, not elsewhere classified: Secondary | ICD-10-CM | POA: Diagnosis not present

## 2023-01-06 DIAGNOSIS — M549 Dorsalgia, unspecified: Secondary | ICD-10-CM | POA: Diagnosis not present

## 2023-01-06 DIAGNOSIS — N281 Cyst of kidney, acquired: Secondary | ICD-10-CM | POA: Diagnosis not present

## 2023-01-06 DIAGNOSIS — M51369 Other intervertebral disc degeneration, lumbar region without mention of lumbar back pain or lower extremity pain: Secondary | ICD-10-CM | POA: Diagnosis not present

## 2023-01-06 DIAGNOSIS — N189 Chronic kidney disease, unspecified: Secondary | ICD-10-CM | POA: Insufficient documentation

## 2023-01-06 DIAGNOSIS — K5730 Diverticulosis of large intestine without perforation or abscess without bleeding: Secondary | ICD-10-CM | POA: Diagnosis not present

## 2023-01-06 DIAGNOSIS — Z79899 Other long term (current) drug therapy: Secondary | ICD-10-CM | POA: Insufficient documentation

## 2023-01-06 DIAGNOSIS — M47816 Spondylosis without myelopathy or radiculopathy, lumbar region: Secondary | ICD-10-CM | POA: Diagnosis not present

## 2023-01-06 DIAGNOSIS — Z91040 Latex allergy status: Secondary | ICD-10-CM | POA: Insufficient documentation

## 2023-01-06 DIAGNOSIS — N200 Calculus of kidney: Secondary | ICD-10-CM | POA: Diagnosis not present

## 2023-01-06 DIAGNOSIS — M8580 Other specified disorders of bone density and structure, unspecified site: Secondary | ICD-10-CM | POA: Diagnosis not present

## 2023-01-06 DIAGNOSIS — I129 Hypertensive chronic kidney disease with stage 1 through stage 4 chronic kidney disease, or unspecified chronic kidney disease: Secondary | ICD-10-CM | POA: Diagnosis not present

## 2023-01-06 DIAGNOSIS — M5459 Other low back pain: Secondary | ICD-10-CM | POA: Diagnosis not present

## 2023-01-06 LAB — CBC WITH DIFFERENTIAL/PLATELET
Abs Immature Granulocytes: 0.01 10*3/uL (ref 0.00–0.07)
Basophils Absolute: 0 10*3/uL (ref 0.0–0.1)
Basophils Relative: 0 %
Eosinophils Absolute: 0.1 10*3/uL (ref 0.0–0.5)
Eosinophils Relative: 3 %
HCT: 38.8 % — ABNORMAL LOW (ref 39.0–52.0)
Hemoglobin: 12.5 g/dL — ABNORMAL LOW (ref 13.0–17.0)
Immature Granulocytes: 0 %
Lymphocytes Relative: 23 %
Lymphs Abs: 1 10*3/uL (ref 0.7–4.0)
MCH: 30.6 pg (ref 26.0–34.0)
MCHC: 32.2 g/dL (ref 30.0–36.0)
MCV: 95.1 fL (ref 80.0–100.0)
Monocytes Absolute: 0.6 10*3/uL (ref 0.1–1.0)
Monocytes Relative: 14 %
Neutro Abs: 2.7 10*3/uL (ref 1.7–7.7)
Neutrophils Relative %: 60 %
Platelets: 185 10*3/uL (ref 150–400)
RBC: 4.08 MIL/uL — ABNORMAL LOW (ref 4.22–5.81)
RDW: 13.8 % (ref 11.5–15.5)
WBC: 4.4 10*3/uL (ref 4.0–10.5)
nRBC: 0 % (ref 0.0–0.2)

## 2023-01-06 LAB — PROTIME-INR
INR: 2.4 — ABNORMAL HIGH (ref 0.8–1.2)
Prothrombin Time: 26.1 s — ABNORMAL HIGH (ref 11.4–15.2)

## 2023-01-06 LAB — BASIC METABOLIC PANEL
Anion gap: 9 (ref 5–15)
BUN: 16 mg/dL (ref 8–23)
CO2: 27 mmol/L (ref 22–32)
Calcium: 9 mg/dL (ref 8.9–10.3)
Chloride: 98 mmol/L (ref 98–111)
Creatinine, Ser: 0.75 mg/dL (ref 0.61–1.24)
GFR, Estimated: >60 mL/min (ref >60)
Glucose, Bld: 98 mg/dL (ref 70–99)
Potassium: 3.9 mmol/L (ref 3.5–5.1)
Sodium: 134 mmol/L — ABNORMAL LOW (ref 135–145)

## 2023-01-06 MED ORDER — LIDOCAINE 5 % EX PTCH
1.0000 | MEDICATED_PATCH | CUTANEOUS | Status: DC
  Administered 2023-01-06: 1 via TRANSDERMAL
  Filled 2023-01-06: qty 1

## 2023-01-06 MED ORDER — LIDOCAINE 5 % EX PTCH: 1.0000 | MEDICATED_PATCH | CUTANEOUS | 0 refills | Status: DC

## 2023-01-06 MED ORDER — FENTANYL CITRATE PF 50 MCG/ML IJ SOSY
25.0000 ug | PREFILLED_SYRINGE | Freq: Once | INTRAMUSCULAR | Status: AC
  Administered 2023-01-06: 25 ug via INTRAVENOUS
  Filled 2023-01-06: qty 1

## 2023-01-06 MED ORDER — IOHEXOL 300 MG/ML  SOLN
100.0000 mL | Freq: Once | INTRAMUSCULAR | Status: AC | PRN
  Administered 2023-01-06: 100 mL via INTRAVENOUS

## 2023-01-06 NOTE — Discharge Instructions (Addendum)
You were seen in the emergency department for hip and low back pain.  Your blood work and CT scans looked reassuring.  Your PT/INR looks a little bit improved compared to your last labs on 12/11.  I have prescribed some more lidocaine patches that you can wear over your low back.  These usually last up to 12 hours.  You can take Tylenol as well for pain.  I recommend following up with your PCP regarding your visit today, so they can follow-up with how you are doing.

## 2023-01-06 NOTE — ED Notes (Signed)
Pt able to move both legs in the bed w/o pain at this time.

## 2023-01-06 NOTE — ED Provider Notes (Signed)
[facility] EMERGENCY DEPARTMENT AT [facility] Provider Note   CSN: [id number] Arrival date & time: 01/06/23  1615     History  Chief Complaint  Patient presents with   Hip Pain    Tristan Lopez is a 89 y.o. male.  PMH of A-fib on warfarin, hypertension, BPH with chronic indwelling Foley, CKD, anemia, AAA.  He presents ER today with his son at bedside who helps with history.  Patient's had 2 to 3 days of right low back pain that got much worse today.  No injury or trauma.  Has had similar pain in the past and was told his possibly sciatica.  He had a birthday party today as it was his 89th birthday and after that he states the pain was getting worse and he could no longer ambulate due to the pain.  His son states he came over and gave him Tylenol and then decided to bring him to the ER due to the significant pain.  He has not had any fevers or chills, no nausea vomiting or abdominal pain.  No complaints regarding Foley catheter.   Hip Pain       Home Medications Prior to Admission medications   Medication Sig Start Date End Date Taking? Authorizing Provider  amLODipine (NORVASC) 5 MG tablet TAKE 1 TABLET (5 MG TOTAL) BY MOUTH DAILY. 01/03/23   Billie Lade, MD  Ascorbic Acid (VITAMIN C) 1000 MG tablet Take 1,000 mg by mouth daily.    [provider]  benazepril (LOTENSIN) 40 MG tablet TAKE 1 TABLET (40 MG TOTAL) BY MOUTH DAILY. 03/05/22   Strader, Lennart Pall, PA-C  cephALEXin (KEFLEX) 500 MG capsule Take 1 capsule (500 mg total) by mouth 3 (three) times daily. 09/01/22   Terrilee Files, MD  Cholecalciferol (VITAMIN D-3) 1000 units CAPS Take 2,000 Units by mouth daily.    [provider]  FEROSUL 325 (65 Fe) MG tablet TAKE ONE TABLET BY MOUTH EVERY DAY 08/03/22   Billie Lade, MD  furosemide (LASIX) 20 MG tablet TAKE ONE TABLET BY MOUTH DAILY AS NEEDED FOR LEG SWELLING 11/29/22   Billie Lade, MD  Garlic 1000 MG CAPS Take 1,000 mg by mouth  daily.    [provider]  hydrOXYzine (VISTARIL) 25 MG capsule Take 1 capsule (25 mg total) by mouth every 8 (eight) hours as needed. 10/11/22   Billie Lade, MD  mirabegron ER (MYRBETRIQ) 25 MG TB24 tablet Take 1 tablet (25 mg total) by mouth daily. 05/11/22   McKenzie, Mardene Damico Partin, MD  pantoprazole (PROTONIX) 40 MG tablet TAKE ONE TABLET BY MOUTH EVERY DAY 09/04/22   Billie Lade, MD  potassium citrate (UROCIT-K) 10 MEQ (1080 MG) SR tablet TAKE ONE TABLET BY MOUTH EVERY DAY 11/01/22   Billie Lade, MD  Probiotic Product (DIGESTIVE ADVANTAGE PO) Take by mouth 2 (two) times daily.     [provider]  propranolol (INDERAL) 20 MG tablet TAKE 1 TABLET (20 MG TOTAL) BY MOUTH DAILY. 11/01/22   Jonelle Sidle, MD  rosuvastatin (CRESTOR) 10 MG tablet TAKE 1 TABLET (10 MG TOTAL) BY MOUTH DAILY. 03/05/22   Strader, Lennart Pall, PA-C  tadalafil (CIALIS) 5 MG tablet TAKE ONE TABLET BY MOUTH EVERY DAY 09/04/22   Billie Lade, MD  warfarin (COUMADIN) 5 MG tablet TAKE 1 TABLET (5 MG TOTAL) BY MOUTH DAILY. 10/18/22   Billie Lade, MD      Allergies  Latex    Review of Systems   Review of Systems  Physical Exam Updated Vital Signs BP (!) 133/103   Pulse 75   Temp 97.8 F (36.6 C) (Oral)   Resp 18   Ht 5\' 9"  (1.753 m)   Wt 103.7 kg   SpO2 99%   BMI 33.76 kg/m  Physical Exam Vitals and nursing note reviewed.  Constitutional:      General: He is not in acute distress.    Appearance: He is well-developed.  HENT:     Head: Normocephalic and atraumatic.     Nose: Nose normal.     Mouth/Throat:     Mouth: Mucous membranes are moist.  Eyes:     Extraocular Movements: Extraocular movements intact.     Conjunctiva/sclera: Conjunctivae normal.     Pupils: Pupils are equal, round, and reactive to light.  Cardiovascular:     Rate and Rhythm: Normal rate and regular rhythm.     Heart sounds: No murmur heard. Pulmonary:     Effort: Pulmonary effort is normal. No  respiratory distress.     Breath sounds: Normal breath sounds.  Abdominal:     Palpations: Abdomen is soft.     Tenderness: There is no abdominal tenderness.  Musculoskeletal:        General: No swelling.     Cervical back: Neck supple.     Comments: Tenderness and spasm to right lumbar paraspinous area and right buttock  Skin:    General: Skin is warm and dry.     Capillary Refill: Capillary refill takes less than 2 seconds.  Neurological:     General: No focal deficit present.     Mental Status: He is alert and oriented to person, place, and time.  Psychiatric:        Mood and Affect: Mood normal.     ED Results / Procedures / Treatments   Labs (all labs ordered are listed, but only abnormal results are displayed) Labs Reviewed  CBC WITH DIFFERENTIAL/PLATELET - Abnormal; Notable for the following components:      Result Value   RBC 4.08 (*)    Hemoglobin 12.5 (*)    HCT 38.8 (*)    All other components within normal limits  BASIC METABOLIC PANEL - Abnormal; Notable for the following components:   Sodium 134 (*)    All other components within normal limits  PROTIME-INR    EKG None  Radiology No results found.  Procedures Procedures    Medications Ordered in ED Medications  lidocaine (LIDODERM) 5 % 1 patch (1 patch Transdermal Patch Applied 01/06/23 1809)  fentaNYL (SUBLIMAZE) injection 25 mcg (25 mcg Intravenous Given 01/06/23 1809)  iohexol (OMNIPAQUE) 300 MG/ML solution 100 mL (100 mLs Intravenous Contrast Given 01/06/23 1833)    ED Course/ Medical Decision Making/ A&P                                 Medical Decision Making This patient presents to the ED for concern of right low back pain, this involves an extensive number of treatment options, and is a complaint that carries with it a high risk of complications and morbidity.  The differential diagnosis includes strain, HNP, kidney stone, abscess, AAA rupture, other   Co morbidities that complicate  the patient evaluation  Chronic warfarin use, A-fib   Additional history obtained:  Additional history obtained from EMR External records from outside source obtained  and reviewed including prior notes and labs   Lab Tests:  I Ordered, and personally interpreted labs.  The pertinent results include: No leukocytosis, hemoglobin 12.5, BMP shows very mild hyponatremia, otherwise normal     Problem List / ED Course / Critical interventions / Medication management  Patient here for right low back pain, onset over couple days, more severe, given patient is 89 years old, could be a fracture or other acute process that CT abdomen pelvis history of AAA as well, and control his pain.  CT is pending, INR pending. Signed out to [employer], PA-C   Amount and/or Complexity of Data Reviewed Labs: ordered. Radiology: ordered.  Risk Prescription drug management.           Final Clinical Impression(s) / ED Diagnoses Final diagnoses:  None    Rx / DC Orders ED Discharge Orders     None         Ma Rings, PA-C 01/06/23 1854    Loetta Rough, MD 01/06/23 2013

## 2023-01-06 NOTE — ED Provider Notes (Signed)
Accepted handoff at shift change from [facility]. Please see prior provider note for full HPI.  Briefly: Patient is a 89 y.o. male who presents to the ER for R hip and low back pain x 1 day. No known injury. Usually ambulates with walker. Lives alone but has caretakers.   DDX/Plan: Patient feeling much better after fentanyl and lidocaine patch. Pending CT imaging and PT/INR (due to being on warfarin for afib). Anticipate dc to home if normal imaging.   Physical Exam  BP (!) 133/103   Pulse 75   Temp 97.8 F (36.6 C) (Oral)   Resp 18   Ht 5\' 9"  (1.753 m)   Wt 103.7 kg   SpO2 99%   BMI 33.76 kg/m   Physical Exam Vitals and nursing note reviewed.  Constitutional:      Appearance: Normal appearance.     Comments: Resting comfortably in exam bed  HENT:     Head: Normocephalic and atraumatic.  Eyes:     Conjunctiva/sclera: Conjunctivae normal.  Pulmonary:     Effort: Pulmonary effort is normal. No respiratory distress.  Skin:    General: Skin is warm and dry.  Neurological:     Mental Status: He is alert.  Psychiatric:        Mood and Affect: Mood normal.        Behavior: Behavior normal.    Results   Protime-INR   Collection Time: 01/06/23  6:00 PM  Result Value Ref Range   Prothrombin Time 26.1 (H) 11.4 - 15.2 seconds   INR 2.4 (H) 0.8 - 1.2   CT L-SPINE NO CHARGE Result Date: 01/06/2023 CLINICAL DATA:  Radiating back pain. EXAM: CT LUMBAR SPINE WITHOUT CONTRAST TECHNIQUE: Multidetector CT imaging of the lumbar spine was performed without intravenous contrast administration. Multiplanar CT image reconstructions were also generated. RADIATION DOSE REDUCTION: This exam was performed according to the departmental dose-optimization program which includes automated exposure control, adjustment of the mA and/or kV according to patient size and/or use of iterative reconstruction technique. COMPARISON:  None Available. FINDINGS: Segmentation: 5 lumbar type vertebrae. Alignment:  Normal. Vertebrae: No compression deformities. Osteopenia. No focal lesions. Paraspinal and other soft tissues: Intraperitoneal and retroperitoneal findings are discussed separately in the report of CT abdomen and pelvis of the same day. Disc levels: Degenerative disc disease identified at each level. There are vacuum disc changes L3-4 and L4-5. Especially wires projecting osteophytes anteriorly at L2 through L5. Facet joint degenerative changes from L2-3 through L5-S1. IMPRESSION: Osteopenia and degenerative changes. No acute osseous abnormalities. Electronically Signed   By: Layla Maw M.D.   On: 01/06/2023 19:12   CT ABDOMEN PELVIS W CONTRAST Result Date: 01/06/2023 CLINICAL DATA:  Abdominal pain. EXAM: CT ABDOMEN AND PELVIS WITH CONTRAST TECHNIQUE: Multidetector CT imaging of the abdomen and pelvis was performed using the standard protocol following bolus administration of intravenous contrast. RADIATION DOSE REDUCTION: This exam was performed according to the departmental dose-optimization program which includes automated exposure control, adjustment of the mA and/or kV according to patient size and/or use of iterative reconstruction technique. CONTRAST:  OMNIPAQUE IOHEXOL 300 MG/ML  SOLN COMPARISON:  02/15/2022 and 06/06/2020. FINDINGS: Lower chest: Cardiomegaly. Atheromatous calcification of the aorta and coronary arteries. No pleural or pericardial effusion. Hepatobiliary: Right hepatic exophytic 7.2 cm mass with calcified rim is a stable finding from previous studies. Another similar finding seen in the left lobe measuring 7.5 cm. No biliary ductal dilatation. Unremarkable gallbladder. Pancreas: Unremarkable. No pancreatic ductal dilatation or  surrounding inflammatory changes. Spleen: Normal in size without focal abnormality. Adrenals/Urinary Tract: Adrenal glands are unremarkable. Several bilateral renal cysts identified with the largest 6.5 cm on the right. No hydronephrosis. 5 mm stone on  the left. Urinary bladder empty with a Foley catheter. Stomach/Bowel: Stomach is within normal limits. Appendix appears normal. No evidence of bowel wall thickening, distention, or inflammatory changes. Diffuse colonic diverticulosis. Vascular/Lymphatic: No suspicious adenopathy. Distal abdominal aortic aneurysm measures 4.4 cm is stable. Follow up every 12 months and vascular consultation are indicated. Dense atheromatous calcifications of the aorta and its branches. Reproductive: Prostate is enlarged, 6.5 x 6.5 x 5.8 cm. Other: No abdominal wall hernia or abnormality. No abdominopelvic ascites. Musculoskeletal: No acute or significant osseous findings. Thoracolumbosacral degenerative changes. IMPRESSION: 1. Stable hepatic masses. 2. Bilateral renal cysts. 3. Nonobstructing left renal stone. 4. Diverticulosis. 5. Prostatomegaly. 6. 4.4 cm distal abdominal aortic aneurysm. This is stable and continued follow up and vascular evaluation recommended. 7. Aortic Atherosclerosis (ICD10-I70.0). Electronically Signed   By: Layla Maw M.D.   On: 01/06/2023 19:08   ED Course / MDM    Medical Decision Making Amount and/or Complexity of Data Reviewed Labs: ordered. Radiology: ordered.  Risk Prescription drug management.  CT abd/pelvis and lumbar spine with no emergent findings. Distal AAA stable. No acute bony abnormalities.   PT/INR improved compared to prior labs on 12/11.  After consideration of the diagnostic results and the patients response to treatment, I feel that emergency department workup does not suggest an emergent condition requiring admission or immediate intervention beyond what has been performed at this time. The plan is: discharge to home with treatment of low back and R hip pain, likely musculoskeletal in origin. Will prescribe lidoderm patches and recommend OTC meds. The patient is safe for discharge and has been instructed to return immediately for worsening symptoms, change in  symptoms or any other concerns.   Jeanella Flattery 01/06/23 1957    Loetta Rough, MD 01/06/23 2014

## 2023-01-06 NOTE — ED Triage Notes (Signed)
Pt with hx of low back pain and sciatica having pain radiating down his right hip today.  Pt denies any injury.

## 2023-01-11 ENCOUNTER — Ambulatory Visit: Payer: Medicare PPO

## 2023-01-11 DIAGNOSIS — R339 Retention of urine, unspecified: Secondary | ICD-10-CM

## 2023-01-11 DIAGNOSIS — N401 Enlarged prostate with lower urinary tract symptoms: Secondary | ICD-10-CM

## 2023-01-11 NOTE — Progress Notes (Signed)
Cath Change/ Replacement  Patient is present today for a catheter change due to urinary retention.  10ml of water was removed from the balloon, a 18FR foley cath was removed without difficulty.  Patient was cleaned and prepped in a sterile fashion with betadine and 2% lidocaine jelly was instilled into the urethra. A 18 FR foley cath was replaced into the bladder, no complications were noted. Urine return was noted 25ml and urine was yellow in color. The balloon was filled with 10ml of sterile water. A leg bag was attached for drainage. Patient was given proper instruction on catheter care.    Performed by: Gwendolyn Grant CMA  Follow up: as scheduled

## 2023-02-08 ENCOUNTER — Ambulatory Visit (INDEPENDENT_AMBULATORY_CARE_PROVIDER_SITE_OTHER): Payer: Medicare PPO

## 2023-02-08 DIAGNOSIS — R339 Retention of urine, unspecified: Secondary | ICD-10-CM | POA: Diagnosis not present

## 2023-02-08 NOTE — Progress Notes (Signed)
Cath Change/ Replacement  Patient is present today for a catheter change due to urinary retention.  10ml of water was removed from the balloon, a 18FR foley cath was removed without difficulty.  Patient was cleaned and prepped in a sterile fashion with betadine and 2% lidocaine jelly was instilled into the urethra. A 18 FR foley cath was replaced into the bladder, no complications were noted. Urine return was noted 30ml and urine was Yellow in color. The balloon was filled with 10ml of sterile water. A leg bag was attached for drainage. Patient was given proper instruction on catheter care.    Performed by: Gwendolyn Grant CMA  Follow up:As scheduled

## 2023-02-12 ENCOUNTER — Other Ambulatory Visit: Payer: Self-pay | Admitting: Cardiology

## 2023-02-12 DIAGNOSIS — Z5181 Encounter for therapeutic drug level monitoring: Secondary | ICD-10-CM | POA: Diagnosis not present

## 2023-02-12 DIAGNOSIS — I4891 Unspecified atrial fibrillation: Secondary | ICD-10-CM | POA: Diagnosis not present

## 2023-02-12 DIAGNOSIS — B353 Tinea pedis: Secondary | ICD-10-CM | POA: Diagnosis not present

## 2023-02-12 DIAGNOSIS — Z7901 Long term (current) use of anticoagulants: Secondary | ICD-10-CM | POA: Diagnosis not present

## 2023-02-12 DIAGNOSIS — M79675 Pain in left toe(s): Secondary | ICD-10-CM | POA: Diagnosis not present

## 2023-02-12 DIAGNOSIS — M79674 Pain in right toe(s): Secondary | ICD-10-CM | POA: Diagnosis not present

## 2023-02-12 DIAGNOSIS — B351 Tinea unguium: Secondary | ICD-10-CM | POA: Diagnosis not present

## 2023-02-12 DIAGNOSIS — R234 Changes in skin texture: Secondary | ICD-10-CM | POA: Diagnosis not present

## 2023-02-13 LAB — PROTIME-INR
INR: 2.8 — ABNORMAL HIGH (ref 0.9–1.2)
Prothrombin Time: 28.8 s — ABNORMAL HIGH (ref 9.1–12.0)

## 2023-02-14 ENCOUNTER — Ambulatory Visit (INDEPENDENT_AMBULATORY_CARE_PROVIDER_SITE_OTHER): Payer: Medicare PPO | Admitting: *Deleted

## 2023-02-14 DIAGNOSIS — Z5181 Encounter for therapeutic drug level monitoring: Secondary | ICD-10-CM

## 2023-02-14 DIAGNOSIS — I4891 Unspecified atrial fibrillation: Secondary | ICD-10-CM | POA: Diagnosis not present

## 2023-02-14 NOTE — Patient Instructions (Signed)
Spoke with [name], pt's son and advised pt to continue warfarin 5 mg daily except 2.5mg  on Sundays .  Recheck INR week of 03/25/23.  Pt gets labs done at [facility] in [place]. Son [name] manages pts medicine. Lab orders mailed to [address], [place], [place] [address]

## 2023-02-22 ENCOUNTER — Other Ambulatory Visit: Payer: Self-pay | Admitting: Student

## 2023-02-26 ENCOUNTER — Other Ambulatory Visit: Payer: Self-pay | Admitting: Internal Medicine

## 2023-02-26 DIAGNOSIS — F419 Anxiety disorder, unspecified: Secondary | ICD-10-CM

## 2023-02-26 MED ORDER — HYDROXYZINE PAMOATE 25 MG PO CAPS: 25.0000 mg | ORAL_CAPSULE | Freq: Three times a day (TID) | ORAL | 0 refills | Status: DC | PRN

## 2023-03-08 ENCOUNTER — Ambulatory Visit: Payer: Medicare PPO

## 2023-03-18 ENCOUNTER — Ambulatory Visit (INDEPENDENT_AMBULATORY_CARE_PROVIDER_SITE_OTHER): Payer: Medicare PPO

## 2023-03-18 DIAGNOSIS — R339 Retention of urine, unspecified: Secondary | ICD-10-CM

## 2023-03-18 NOTE — Progress Notes (Signed)
 Cath Change/ Replacement  Patient is present today for a catheter change due to urinary retention.  10ml of water was removed from the balloon, a 18FR foley cath was removed without difficulty.  Patient was cleaned and prepped in a sterile fashion with Betadinex3.  A 18 FR foley cath was replaced into the bladder, no complications were noted. Urine return was noted 30ml and urine was Dark yellow in color. The balloon was filled with 10ml of sterile water. A leg bag was attached for drainage. Patient requested to use current leg bag due to it just being replaced. Patient was given proper instruction on catheter care.    Performed by: Alfonse Spruce. CMA  Follow up: 4 weeks

## 2023-03-26 ENCOUNTER — Telehealth: Payer: Self-pay | Admitting: Urology

## 2023-03-26 NOTE — Telephone Encounter (Signed)
 Insurance is not Neurosurgeon and needs something different that [facility] will cover

## 2023-03-26 NOTE — Telephone Encounter (Signed)
 Spoke with daughter and she will have her brother bring the paper from the insurance by during his next [place] to see what alternatives they would cover.  Daughter states they just received a refill and he will have enough to cover the next 30 days.

## 2023-03-27 ENCOUNTER — Other Ambulatory Visit: Payer: Self-pay | Admitting: Internal Medicine

## 2023-03-27 DIAGNOSIS — F419 Anxiety disorder, unspecified: Secondary | ICD-10-CM

## 2023-03-27 MED ORDER — HYDROXYZINE PAMOATE 25 MG PO CAPS: 25.0000 mg | ORAL_CAPSULE | Freq: Three times a day (TID) | ORAL | 0 refills | Status: DC | PRN

## 2023-03-28 DIAGNOSIS — I4891 Unspecified atrial fibrillation: Secondary | ICD-10-CM | POA: Diagnosis not present

## 2023-03-28 DIAGNOSIS — Z5181 Encounter for therapeutic drug level monitoring: Secondary | ICD-10-CM | POA: Diagnosis not present

## 2023-03-29 LAB — PROTIME-INR
INR: 3.5 — ABNORMAL HIGH (ref 0.9–1.2)
Prothrombin Time: 35.6 s — ABNORMAL HIGH (ref 9.1–12.0)

## 2023-04-01 ENCOUNTER — Ambulatory Visit (INDEPENDENT_AMBULATORY_CARE_PROVIDER_SITE_OTHER): Admitting: *Deleted

## 2023-04-01 DIAGNOSIS — I4891 Unspecified atrial fibrillation: Secondary | ICD-10-CM

## 2023-04-01 DIAGNOSIS — Z5181 Encounter for therapeutic drug level monitoring: Secondary | ICD-10-CM

## 2023-04-01 NOTE — Patient Instructions (Signed)
 Spoke with [name], pt's son and advised pt to hold warfarin tonight then resume 5 mg daily except 2.5mg  on Sundays .  Recheck INR week of 05/13/23.  Pt gets labs done at [facility] in [place]. Son [name] manages pts medicine. Lab orders mailed to [address] [place], [place] [address]

## 2023-04-05 ENCOUNTER — Ambulatory Visit: Payer: Medicare PPO

## 2023-04-11 ENCOUNTER — Encounter: Payer: Self-pay | Admitting: Internal Medicine

## 2023-04-11 ENCOUNTER — Ambulatory Visit: Payer: Medicare PPO | Admitting: Internal Medicine

## 2023-04-11 ENCOUNTER — Ambulatory Visit: Payer: Medicare PPO

## 2023-04-11 ENCOUNTER — Other Ambulatory Visit: Payer: Self-pay | Admitting: Internal Medicine

## 2023-04-11 ENCOUNTER — Encounter: Payer: Self-pay | Admitting: Urology

## 2023-04-11 VITALS — BP 129/69 | HR 88 | Ht 69.0 in | Wt 223.4 lb

## 2023-04-11 DIAGNOSIS — R451 Restlessness and agitation: Secondary | ICD-10-CM

## 2023-04-11 DIAGNOSIS — F419 Anxiety disorder, unspecified: Secondary | ICD-10-CM

## 2023-04-11 DIAGNOSIS — R339 Retention of urine, unspecified: Secondary | ICD-10-CM

## 2023-04-11 MED ORDER — CITALOPRAM HYDROBROMIDE 10 MG PO TABS: 10.0000 mg | ORAL_TABLET | Freq: Every day | ORAL | 3 refills | Status: DC

## 2023-04-11 NOTE — Assessment & Plan Note (Signed)
 Returning to care today for routine follow-up.  Hydroxyzine 25 mg as needed for anxiety relief was added at his last appointment in September.  The patient's son reports that he becomes agitated and irritable 1-2 times weekly, often in the late afternoon.  Tristan Lopez now has and aide with him at all times.  I suspect he has cognitive impairment and is displaying sundowning. -Treatment options were reviewed.  Ultimately, citalopram 10 mg daily has been started.  There is room to increase the dose if needed.  We also discussed the importance of sleep-wake cycle regulation, maintaining a routine, and pain control.  We will tentatively plan for follow-up in 3 months, however the patient's son will contact us if citalopram is not effective.  Can consider increasing the dose or adding additional medication at that time if needed.

## 2023-04-11 NOTE — Patient Instructions (Signed)
 It was a pleasure to see you today.  Thank you for giving [place] the opportunity to be involved in your care.  Below is a brief recap of your visit and next steps.  We will plan to see you again in 3 months.  Summary Start citalopram 10 mg daily for anxiety / agitation No additional medication changes We will plan for follow up in 3 months

## 2023-04-11 NOTE — Progress Notes (Signed)
 Established Patient Office Visit  Subjective   Patient ID: Tristan Lopez, male    DOB: 22-Dec-1930  Age: 89 y.o. MRN: [id number]  Chief Complaint  Patient presents with   Care Management    Six month follow up , patient son states patient is having difficulties getting up and down , wondering if a lift chair would be better for him    Anxiety    Patient son states the hydroxyzine is not helping    Tristan Lopez returns to care today for routine follow-up.  He was last evaluated by me in September 2024.  At that time his son Tristan Lopez expressed concern over recent anxiety.  Hydroxyzine was added for as needed anxiety relief.  No additional medication changes were made, repeat labs ordered, and 45-month follow-up arranged.  In the interim, he presented to the emergency department on 12/15 endorsing right hip and low back pain.  Imaging was reassuring.  Patient endorsed significant improvement after receiving medicine for pain control.  He has been seen by cardiology and urology for follow-up.  There have otherwise been no acute interval events. Tristan Lopez reports feeling well today.  He is asymptomatic and has no acute concerns to discuss.  His son Tristan Lopez is with him today and states that hydroxyzine has not been effective in alleviating anxiety and agitation.  He states that he Tristan Lopez becomes agitated in the late afternoon.  He would like to explore other medication options.  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 [name]   COLONOSCOPY  11/01/11   Dr. Jena Gauss- tubular adenoma,suboptimal preparation, internal hemorrhoids, o/w normal rectum. pancolonic diverticulosis   COLONOSCOPY WITH PROPOFOL N/A 11/29/2020   Procedure: COLONOSCOPY WITH PROPOFOL;  Surgeon: Dolores Frame, MD;  Location: [facility];  Service: Gastroenterology;  Laterality: N/A;   ESOPHAGOGASTRODUODENOSCOPY  11/01/11   Dr. Jena Gauss- erosive reflux esophagitis along with a patulous EG junction, hialtal hernia, antral erosions with possible area of healing ulceration- s/p bx= chronic erosive gastritis, no malignancy   ESOPHAGOGASTRODUODENOSCOPY (EGD) WITH PROPOFOL N/A 11/29/2020   Procedure: ESOPHAGOGASTRODUODENOSCOPY (EGD) WITH PROPOFOL;  Surgeon: Dolores Frame, MD;  Location: [facility];  Service: Gastroenterology;  Laterality: N/A;   HERNIA REPAIR     x2, bilateral inguinal    KIDNEY STONE SURGERY     KNEE ARTHROSCOPY     X2   NASAL ENDOSCOPY     Social History   Tobacco Use   Smoking status: Former    Current packs/day: 0.00    Types: Cigarettes    Quit date: 1992    Years since quitting: 67.2  Smokeless tobacco: Never  Vaping Use   Vaping status: Never Used  Substance Use Topics   Alcohol use: No   Drug use: No   Family History  Problem Relation Age of Onset   Breast cancer Mother    Other Mother        Essential tremor   Stroke Mother    Heart disease Father        Pacemaker   Heart attack Maternal Grandfather    Renal cancer Son    Colon cancer Neg Hx    Liver disease Neg Hx    Prostate cancer Neg Hx    Bladder Cancer Neg Hx    Kidney cancer Neg Hx    Allergies  Allergen Reactions   Latex Rash   Review of Systems  Constitutional:  Negative for chills  and fever.  HENT:  Negative for sore throat.   Respiratory:  Negative for cough and shortness of breath.   Cardiovascular:  Negative for chest pain, palpitations and leg swelling.  Gastrointestinal:  Negative for abdominal pain, blood in stool, constipation, diarrhea, nausea and vomiting.  Genitourinary:  Negative for dysuria and hematuria.  Musculoskeletal:  Negative for myalgias.  Skin:  Negative for itching and rash.  Neurological:  Negative for dizziness and headaches.  Psychiatric/Behavioral:  Negative for depression and suicidal ideas.      Objective:     BP 129/69 (BP Location: Left Arm, Patient Position: Sitting, Cuff Size: Large)   Pulse 88   Ht 5\' 9"  (1.753 m)   Wt 223 lb 6.4 oz (101.3 kg)   SpO2 97%   BMI 32.99 kg/m  BP Readings from Last 3 Encounters:  04/11/23 129/69  01/06/23 (!) 134/120  10/11/22 107/70   Physical Exam Vitals reviewed.  Constitutional:      General: He is not in acute distress.    Appearance: Normal appearance. He is not ill-appearing.  HENT:     Head: Normocephalic and atraumatic.     Right Ear: External ear normal.     Left Ear: External ear normal.     Nose: Nose normal. No congestion or rhinorrhea.     Mouth/Throat:     Mouth: Mucous membranes are moist.     Pharynx: Oropharynx is clear.  Eyes:     General: No scleral icterus.    Extraocular Movements: Extraocular movements intact.     Conjunctiva/sclera: Conjunctivae normal.     Pupils: Pupils are equal, round, and reactive to light.  Cardiovascular:     Rate and Rhythm: Normal rate. Rhythm irregular.     Pulses: Normal pulses.     Heart sounds: Normal heart sounds. No murmur heard. Pulmonary:     Effort: Pulmonary effort is normal.     Breath sounds: Normal breath sounds. No wheezing, rhonchi or rales.  Abdominal:     General: Abdomen is flat. Bowel sounds are normal. There is no distension.     Palpations: Abdomen is soft.     Tenderness: There is no abdominal tenderness.   Musculoskeletal:        General: No swelling or deformity. Normal range of motion.     Cervical back: Normal range of motion.  Skin:    General: Skin is warm and dry.     Capillary Refill: Capillary refill takes less than 2 seconds.  Neurological:     General: No focal deficit present.     Mental Status: He is alert and oriented to person, place, and time.  Motor: No weakness.     Gait: Gait abnormal (Ambulates with a rollator).  Psychiatric:        Mood and Affect: Mood normal.        Behavior: Behavior normal.        Thought Content: Thought content normal.   Last CBC Lab Results  Component Value Date   WBC 4.4 01/06/2023   HGB 12.5 (L) 01/06/2023   HCT 38.8 (L) 01/06/2023   MCV 95.1 01/06/2023   MCH 30.6 01/06/2023   RDW 13.8 01/06/2023   PLT 185 01/06/2023   Last metabolic panel Lab Results  Component Value Date   GLUCOSE 98 01/06/2023   NA 134 (L) 01/06/2023   K 3.9 01/06/2023   CL 98 01/06/2023   CO2 27 01/06/2023   BUN 16 01/06/2023   CREATININE 0.75 01/06/2023   GFRNONAA >60 01/06/2023   CALCIUM 9.0 01/06/2023   PHOS 2.5 11/29/2020   PROT 6.8 09/01/2022   ALBUMIN 3.6 09/01/2022   LABGLOB 2.4 03/27/2022   AGRATIO 1.7 03/27/2022   BILITOT 1.2 09/01/2022   ALKPHOS 50 09/01/2022   AST 18 09/01/2022   ALT 11 09/01/2022   ANIONGAP 9 01/06/2023   Last lipids Lab Results  Component Value Date   CHOL 141 10/11/2022   HDL 50 10/11/2022   LDLCALC 77 10/11/2022   TRIG 68 10/11/2022   CHOLHDL 2.8 10/11/2022   Last hemoglobin A1c Lab Results  Component Value Date   HGBA1C 5.4 03/27/2022   Last thyroid functions Lab Results  Component Value Date   TSH 1.610 03/27/2022   Last vitamin D Lab Results  Component Value Date   VD25OH 40.9 03/27/2022   Last vitamin B12 and Folate Lab Results  Component Value Date   VITAMINB12 298 03/27/2022   FOLATE 5.2 03/27/2022     Assessment & Plan:   Problem List Items Addressed This Visit       Anxiety  with agitation - Primary   Returning to care today for routine follow-up.  Hydroxyzine 25 mg as needed for anxiety relief was added at his last appointment in September.  The patient's son reports that he becomes agitated and irritable 1-2 times weekly, often in the late afternoon.  Tristan Lopez now has and aide with him at all times.  I suspect he has cognitive impairment and is displaying sundowning. -Treatment options were reviewed.  Ultimately, citalopram 10 mg daily has been started.  There is room to increase the dose if needed.  We also discussed the importance of sleep-wake cycle regulation, maintaining a routine, and pain control.  We will tentatively plan for follow-up in 3 months, however the patient's son will contact us if citalopram is not effective.  Can consider increasing the dose or adding additional medication at that time if needed.       Return in about 3 months (around 07/12/2023).    Billie Lade, MD

## 2023-04-11 NOTE — Progress Notes (Signed)
 Cath Change/ Replacement  Patient is present today for a catheter change due to urinary retention.  10ml of water was removed from the balloon, a 18FR foley cath was removed without difficulty.  Patient was cleaned and prepped in a sterile fashion with Betadinex3.  A 18 FR foley cath was replaced into the bladder, no complications were noted. Urine return was noted 20ml and urine was Clear yellow in color. The balloon was filled with 10ml of sterile water. A leg bag was attached for drainage.  A night bag was also given to the patient and patient was given instruction on how to change from one bag to another. Patient was given proper instruction on catheter care.    Performed by: Shigeru Lampert LPN  Follow up: 4 weeks

## 2023-04-11 NOTE — Patient Instructions (Signed)

## 2023-04-15 ENCOUNTER — Other Ambulatory Visit: Payer: Self-pay

## 2023-04-15 DIAGNOSIS — R262 Difficulty in walking, not elsewhere classified: Secondary | ICD-10-CM

## 2023-04-15 DIAGNOSIS — R490 Dysphonia: Secondary | ICD-10-CM

## 2023-04-15 MED ORDER — UNABLE TO FIND: 1.0000 | Freq: Every day | 0 refills | Status: AC

## 2023-04-15 NOTE — Telephone Encounter (Signed)
 Printed and mailed

## 2023-04-17 MED ORDER — AMBULATORY NON FORMULARY MEDICATION: 11 refills | Status: DC

## 2023-04-29 ENCOUNTER — Ambulatory Visit: Payer: Medicare PPO

## 2023-05-11 ENCOUNTER — Other Ambulatory Visit: Payer: Self-pay | Admitting: Internal Medicine

## 2023-05-14 DIAGNOSIS — Z5181 Encounter for therapeutic drug level monitoring: Secondary | ICD-10-CM | POA: Diagnosis not present

## 2023-05-14 DIAGNOSIS — I4891 Unspecified atrial fibrillation: Secondary | ICD-10-CM | POA: Diagnosis not present

## 2023-05-15 ENCOUNTER — Ambulatory Visit (INDEPENDENT_AMBULATORY_CARE_PROVIDER_SITE_OTHER): Payer: Self-pay | Admitting: *Deleted

## 2023-05-15 ENCOUNTER — Ambulatory Visit: Admitting: Urology

## 2023-05-15 ENCOUNTER — Encounter: Payer: Self-pay | Admitting: Urology

## 2023-05-15 VITALS — BP 159/87 | HR 94

## 2023-05-15 DIAGNOSIS — N138 Other obstructive and reflux uropathy: Secondary | ICD-10-CM

## 2023-05-15 DIAGNOSIS — I4891 Unspecified atrial fibrillation: Secondary | ICD-10-CM

## 2023-05-15 DIAGNOSIS — Z5181 Encounter for therapeutic drug level monitoring: Secondary | ICD-10-CM | POA: Diagnosis not present

## 2023-05-15 DIAGNOSIS — R338 Other retention of urine: Secondary | ICD-10-CM

## 2023-05-15 DIAGNOSIS — N401 Enlarged prostate with lower urinary tract symptoms: Secondary | ICD-10-CM

## 2023-05-15 DIAGNOSIS — R339 Retention of urine, unspecified: Secondary | ICD-10-CM

## 2023-05-15 LAB — PROTIME-INR
INR: 2.4 — ABNORMAL HIGH (ref 0.9–1.2)
Prothrombin Time: 25.6 s — ABNORMAL HIGH (ref 9.1–12.0)

## 2023-05-15 MED ORDER — MIRABEGRON ER 25 MG PO TB24: 25.0000 mg | ORAL_TABLET | Freq: Every day | ORAL | 11 refills | Status: AC

## 2023-05-15 MED ORDER — CIPROFLOXACIN HCL 500 MG PO TABS
500.0000 mg | ORAL_TABLET | Freq: Once | ORAL | Status: AC
  Administered 2023-05-15: 500 mg via ORAL

## 2023-05-15 NOTE — Patient Instructions (Signed)
 Trouble Peeing (Acute Urinary Retention) in Males: What to Know Acute urinary retention is when a person can't pee at all or can only pee a little. This can come on all of a sudden. If it's not treated, it can lead to kidney problems or other serious problems. What are the causes? Acute urinary retention may be caused by: A problem with the urethra. This is the tube that drains pee from the bladder. Problems with the nerves in the bladder. Tumors. Some medicines. An infection. Having trouble pooping (constipation). What increases the risk? Older males are more at risk because their prostate gland may get larger as they age. Other health problems can also raise the risk. These include: Multiple sclerosis. Injury to the spinal cord. Diabetes. A condition that affects the way the brain works, such as dementia. Mental health problems. Having urinary retention before. What are the signs or symptoms? Trouble peeing. Pain in the lower belly. How is this treated? Treatment may include: Medicines. Treatment for problems that may cause this. Placing a soft tube called a catheter into the bladder to drain pee out of the body. Therapy to treat mental health problems. If needed, you may be treated in the hospital for kidney problems. Follow these instructions at home: Medicines Take medicines only as told. Do not take any medicine unless your doctor says it's OK. If you were given antibiotics, take them as told. Do not stop taking them even if you start to feel better. General instructions Do not smoke, vape, or use nicotine or tobacco. Drink more fluids as told. If you need to use a catheter, follow the instructions closely. This will help prevent a bladder infection. If told, keep track of changes in your blood pressure at home. Tell your doctor about them. Contact a doctor if: You have bladder cramping, called spasms. You leak pee when you have spasms. You have a fever or chills. You  have blood in your pee. Get help right away if: You can't pee. This information is not intended to replace advice given to you by your health care provider. Make sure you discuss any questions you have with your health care provider. Document Revised: 09/06/2022 Document Reviewed: 09/06/2022 Elsevier Patient Education  2024 [employer].

## 2023-05-15 NOTE — Patient Instructions (Signed)
 Spoke with Gorman Laughter, pt's son and advised pt to continue warfarin 5 mg daily except 2.5mg  on Sundays .  Recheck INR week of 06/24/23.  Pt gets labs done at [facility] in [place]. Son Gorman Laughter manages pts medicine. Lab orders mailed to [address] [place], [place] [address]

## 2023-05-15 NOTE — Progress Notes (Signed)
 Cath Change/ Replacement  Patient is present today for a catheter change due to urinary retention.  10ml of water  was removed from the balloon, a 18FR foley cath was removed without difficulty.  Patient was cleaned and prepped in a sterile fashion with Betadinex3.  A 18 FR foley cath was replaced into the bladder, no complications were noted. Urine return was noted 20ml and urine was Amber in color. The balloon was filled with 10ml of sterile water . A leg bag was attached for drainage.  A night bag was also given to the patient and patient was given instruction on how to change from one bag to another. Patient was given proper instruction on catheter care.    Performed by: Wyonia Hefty, CMA  Follow up: 4 weeks

## 2023-05-15 NOTE — Progress Notes (Signed)
 05/15/2023 11:12 AM   Tristan Lopez [birth year] [id number]  Referring provider: Tobi Fortes, Lopez [address] [place],  [place] [address]  Followup BPH   HPI: Tristan Lopez is a 89yo here for followup for BPh with urinary retention. He is doing well with an indwelling foley. No UTi since last visit. No bladder spasms on mirabegron    PMH: Past Medical History:  Diagnosis Date   Abdominal aortic aneurysm without rupture (HCC)    Bilateral renal cysts    BPH (benign prostatic hyperplasia)    Carotid artery occlusion    Chronic atrial fibrillation (HCC)    COPD (chronic obstructive pulmonary disease) (HCC)    Diverticulosis    Pancolonic   Essential hypertension    Hemorrhoids    Hiatal hernia    Hx of adenomatous colonic polyps    Hypercholesteremia    Liver cyst    Benign by liver biopsy September 2013   Nephrolithiasis    Nephrolithiasis    Permanent atrial fibrillation (HCC)    Pre-diabetes    Reflux esophagitis    Sleep apnea    Uses CPAP   Tubular adenoma     Surgical History: Past Surgical History:  Procedure Laterality Date   COLONOSCOPY     2003, no polyps per patient. Tristan Lopez   COLONOSCOPY  11/01/11   Tristan Lopez- tubular adenoma,suboptimal preparation, internal hemorrhoids, o/w normal rectum. pancolonic diverticulosis   COLONOSCOPY WITH PROPOFOL  N/A 11/29/2020   Procedure: COLONOSCOPY WITH PROPOFOL ;  Surgeon: Tristan Garden, Lopez;  [facility]: [facility];  Service: Gastroenterology;  Laterality: N/A;   ESOPHAGOGASTRODUODENOSCOPY  11/01/11   Tristan Lopez- erosive reflux esophagitis along with a patulous EG junction, hialtal hernia, antral erosions with possible area of healing ulceration- s/p bx= chronic erosive gastritis, no malignancy   ESOPHAGOGASTRODUODENOSCOPY (EGD) WITH PROPOFOL  N/A 11/29/2020   Procedure: ESOPHAGOGASTRODUODENOSCOPY (EGD) WITH PROPOFOL ;  Surgeon: Tristan Garden, Lopez;  Location: [facility];  Service:  Gastroenterology;  Laterality: N/A;   HERNIA REPAIR     x2, bilateral inguinal    KIDNEY STONE SURGERY     KNEE ARTHROSCOPY     X2   NASAL ENDOSCOPY      Home Medications:  Allergies as of 05/15/2023       Reactions   Latex Rash        Medication List        Accurate as of May 15, 2023 11:12 AM. If you have any questions, ask your nurse or doctor.          STOP taking these medications    AMBULATORY NON FORMULARY MEDICATION       TAKE these medications    amLODipine  5 MG tablet Commonly known as: NORVASC  TAKE 1 TABLET (5 MG TOTAL) BY MOUTH DAILY.   benazepril  40 MG tablet Commonly known as: LOTENSIN  TAKE 1 TABLET (40 MG TOTAL) BY MOUTH DAILY.   citalopram  10 MG tablet Commonly known as: CELEXA  Take 1 tablet (10 mg total) by mouth daily.   DIGESTIVE ADVANTAGE PO Take by mouth 2 (two) times daily.   FeroSul 325 (65 Fe) MG tablet Generic drug: ferrous sulfate  TAKE ONE TABLET BY MOUTH EVERY DAY   furosemide  20 MG tablet Commonly known as: LASIX  TAKE ONE TABLET BY MOUTH DAILY AS NEEDED FOR LEG SWELLING   Garlic 1000 MG Caps Take 1,000 mg by mouth daily.   mirabegron  ER 25 MG Tb24 tablet Commonly known as: MYRBETRIQ  Take 1 tablet (  25 mg total) by mouth daily.   pantoprazole  40 MG tablet Commonly known as: PROTONIX  TAKE ONE TABLET BY MOUTH EVERY DAY   potassium citrate  10 MEQ (1080 MG) SR tablet Commonly known as: UROCIT-K  TAKE ONE TABLET BY MOUTH EVERY DAY   propranolol  20 MG tablet Commonly known as: INDERAL  TAKE 1 TABLET (20 MG TOTAL) BY MOUTH DAILY.   rosuvastatin  10 MG tablet Commonly known as: CRESTOR  TAKE 1 TABLET (10 MG TOTAL) BY MOUTH DAILY.   tadalafil  5 MG tablet Commonly known as: CIALIS  TAKE ONE TABLET BY MOUTH EVERY DAY   UNABLE TO FIND 1 each by Does not apply route daily. Med Name: LIFT CHAIR   vitamin C 1000 MG tablet Take 1,000 mg by mouth daily.   Vitamin D -3 25 MCG (1000 UT) Caps Take 2,000 Units by mouth  daily.   warfarin 5 MG tablet Commonly known as: COUMADIN  Take as directed by the anticoagulation clinic. If you are unsure how to take this medication, talk to your nurse or doctor. Original instructions: TAKE 1 TABLET (5 MG TOTAL) BY MOUTH DAILY.        Allergies:  Allergies  Allergen Reactions   Latex Rash    Family History: Family History  Problem Relation Age of Onset   Breast cancer Mother    Other Mother        Essential tremor   Stroke Mother    Heart disease Father        Pacemaker   Heart attack Maternal Grandfather    Renal cancer Son    Colon cancer Neg Hx    Liver disease Neg Hx    Prostate cancer Neg Hx    Bladder Cancer Neg Hx    Kidney cancer Neg Hx     Social History:  reports that he quit smoking about 33 years ago. His smoking use included cigarettes. He has never used smokeless tobacco. He reports that he does not drink alcohol and does not use drugs.  ROS: All other review of systems were reviewed and are negative except what is noted above in HPI  Physical Exam: BP (!) 159/87   Pulse 94   Constitutional:  Alert and oriented, No acute distress. HEENT: [place] AT, moist mucus membranes.  Trachea midline, no masses. Cardiovascular: No clubbing, cyanosis, or edema. Respiratory: Normal respiratory effort, no increased work of breathing. GI: Abdomen is soft, nontender, nondistended, no abdominal masses GU: No CVA tenderness.  Lymph: No cervical or inguinal lymphadenopathy. Skin: No rashes, bruises or suspicious lesions. Neurologic: Grossly intact, no focal deficits, moving all 4 extremities. Psychiatric: Normal mood and affect.  Laboratory Data: Lab Results  Component Value Date   WBC 4.4 01/06/2023   HGB 12.5 (L) 01/06/2023   HCT 38.8 (L) 01/06/2023   MCV 95.1 01/06/2023   PLT 185 01/06/2023    Lab Results  Component Value Date   CREATININE 0.75 01/06/2023    No results found for: "PSA"  No results found for: "TESTOSTERONE"  Lab  Results  Component Value Date   HGBA1C 5.4 03/27/2022    Urinalysis    Component Value Date/Time   COLORURINE AMBER (A) 09/01/2022 1400   APPEARANCEUR CLOUDY (A) 09/01/2022 1400   APPEARANCEUR Cloudy (A) 05/10/2021 1001   LABSPEC 1.024 09/01/2022 1400   PHURINE 7.0 09/01/2022 1400   GLUCOSEU NEGATIVE 09/01/2022 1400   HGBUR NEGATIVE 09/01/2022 1400   BILIRUBINUR NEGATIVE 09/01/2022 1400   BILIRUBINUR Negative 05/10/2021 1001   KETONESUR NEGATIVE 09/01/2022 1400  PROTEINUR 100 (A) 09/01/2022 1400   UROBILINOGEN 0.2 10/04/2011 2218   NITRITE NEGATIVE 09/01/2022 1400   LEUKOCYTESUR TRACE (A) 09/01/2022 1400    Lab Results  Component Value Date   LABMICR See below: 05/10/2021   WBCUA 11-30 (A) 05/10/2021   LABEPIT 0-10 05/10/2021   MUCUS Present 05/10/2021   BACTERIA RARE (A) 09/01/2022    Pertinent Imaging:  No results found for this or any previous visit.  No results found for this or any previous visit.  No results found for this or any previous visit.  No results found for this or any previous visit.  No results found for this or any previous visit.  No results found for this or any previous visit.  No results found for this or any previous visit.  Results for orders placed during the hospital encounter of 02/15/22  CT Renal Stone Study  Narrative CLINICAL DATA:  Abdominal pain.  Concern for kidney stone.  EXAM: CT ABDOMEN AND PELVIS WITHOUT CONTRAST  TECHNIQUE: Multidetector CT imaging of the abdomen and pelvis was performed following the standard protocol without IV contrast.  RADIATION DOSE REDUCTION: This exam was performed according to the departmental dose-optimization program which includes automated exposure control, adjustment of the mA and/or kV according to patient size and/or use of iterative reconstruction technique.  COMPARISON:  CT abdomen pelvis dated 06/06/2020.  FINDINGS: Evaluation of this exam is limited in the absence of  intravenous contrast.  Lower chest: The visualized lung bases are clear. There is cardiomegaly with 3 vessel coronary vascular calcification.  No intra-abdominal free air or free fluid.  Hepatobiliary: Similar appearance of heterogeneous exophytic hepatic masses with rim calcification measuring up to 7.2 cm in diameter in the right lobe of the liver. No biliary ductal dilatation. The gallbladder is unremarkable  Pancreas: Unremarkable. No pancreatic ductal dilatation or surrounding inflammatory changes.  Spleen: Normal in size without focal abnormality.  Adrenals/Urinary Tract: The adrenal glands are unremarkable. There is a 1 cm linear nonobstructing left renal inferior pole calculus. Punctate nonobstructing calculus versus vascular calcification of the inferior pole of the right kidney. There is no hydronephrosis on either side. Bilateral renal cysts. The visualized ureters appear unremarkable. Diffusely thickened and trabeculated appearance of the bladder wall likely related to chronic bladder outlet obstruction. Correlation with urinalysis recommended to exclude cystitis. The urinary bladder is minimally distended. A Foley catheter is noted with balloon in the bladder. Several subcentimeter bladder calculi versus bladder wall calcification.  Stomach/Bowel: There is a small hiatal hernia. There is extensive colonic diverticulosis without active inflammatory changes. There is no bowel obstruction or active inflammation. The appendix is normal.  Vascular/Lymphatic: Advanced aortoiliac atherosclerotic disease. There is a 4.4 cm infrarenal abdominal aortic aneurysm (previously 3.8 cm). The IVC is unremarkable. No portal venous gas. There is no adenopathy.  Reproductive: Mildly enlarged prostate gland measuring 5.5 cm in transverse axial diameter.  Other: None  Musculoskeletal: Osteopenia with degenerative changes of the spine. No acute osseous pathology.  IMPRESSION: 1.  Vascular calcification versus nonobstructing bilateral renal calculi. No hydronephrosis. 2. Diffusely thickened and trabeculated appearance of the bladder wall likely related to chronic bladder outlet obstruction. Correlation with urinalysis recommended to exclude cystitis. 3. Colonic diverticulosis.  No bowel obstruction. Normal appendix. 4. A 4.4 cm infrarenal abdominal aortic aneurysm (previously 3.8 cm). Recommend follow-up every 12 months and vascular consultation. This recommendation follows ACR consensus guidelines: White Paper of the ACR Incidental Findings Committee II on Vascular Findings. J Am Coll Radiol 2013;  95:638-756. 5.  Aortic Atherosclerosis (ICD10-I70.0).   Electronically Signed By: Angus Bark M.D. On: 02/15/2022 21:58   Assessment & Plan:    1. Urinary retention (Primary) Continue monthly foley changes -continue mirabegron  25mg   - INSERT,TEMP INDWELLING BLAD CATH,SIMPLE - ciprofloxacin  (CIPRO ) tablet 500 mg  2. Benign prostatic hyperplasia with urinary obstruction Continue monthly foley changes   No follow-ups on file.  Johnie Nailer, Lopez  [facility] [place]

## 2023-05-17 ENCOUNTER — Ambulatory Visit: Payer: Medicare PPO | Admitting: Urology

## 2023-05-29 ENCOUNTER — Encounter: Payer: Self-pay | Admitting: Internal Medicine

## 2023-05-29 DIAGNOSIS — L89309 Pressure ulcer of unspecified buttock, unspecified stage: Secondary | ICD-10-CM

## 2023-05-30 ENCOUNTER — Ambulatory Visit: Payer: Self-pay

## 2023-05-30 NOTE — Telephone Encounter (Signed)
 Appointment scheduled.

## 2023-05-30 NOTE — Telephone Encounter (Signed)
 Copied from CRM [id number]. Topic: Clinical - Red Word Triage >> May 30, 2023  9:30 AM Marissa P wrote: Red Word that prompted transfer to Nurse Triage: Pressure sore on his but, theres pain and discomfort.    Chief Complaint: Pressure sore to buttocks Symptoms: Discomfort Frequency: Easter Pertinent Negatives: Patient denies fever Disposition: [] ED /[] Urgent Care (no appt availability in office) / [x] Appointment(In office/virtual)/ []  [facility]/ [] Home Care/ [] Refused Recommended Disposition /[] [facility]/ []  Follow-up with PCP Additional Notes: Daughter agrees with appointment.  Reason for Disposition  [1] After 14 days AND [2] wound isn't healed  Answer Assessment - Initial Assessment Questions 1. APPEARANCE of INJURY: "What does the injury look like?"      Mall broken skin 2. SIZE: "How large is the cut?"      Small 3. BLEEDING: "Is it bleeding now?" If Yes, ask: "Is it difficult to stop?"      Mild 4. LOCATION: "Where is the injury located?"      Buttocks 5. ONSET: "How long ago did the injury occur?"      Easter 6. MECHANISM: "Tell me how it happened."      Pressure 7. TETANUS: "When was the last tetanus booster?"     N/a 8. PREGNANCY: "Is there any chance you are pregnant?" "When was your last menstrual period?"     N/a  Protocols used: Skin Injury-A-AH

## 2023-05-31 ENCOUNTER — Ambulatory Visit: Payer: Self-pay | Admitting: Family Medicine

## 2023-05-31 ENCOUNTER — Encounter: Payer: Self-pay | Admitting: Family Medicine

## 2023-05-31 VITALS — BP 152/83 | HR 83 | Ht 69.0 in | Wt 222.0 lb

## 2023-05-31 DIAGNOSIS — L89321 Pressure ulcer of left buttock, stage 1: Secondary | ICD-10-CM | POA: Diagnosis not present

## 2023-05-31 DIAGNOSIS — L89301 Pressure ulcer of unspecified buttock, stage 1: Secondary | ICD-10-CM | POA: Diagnosis not present

## 2023-05-31 MED ORDER — DIMETHICONE 1 % EX CREA: 1.0000 | TOPICAL_CREAM | Freq: Two times a day (BID) | CUTANEOUS | 0 refills | Status: AC | PRN

## 2023-05-31 MED ORDER — CITALOPRAM HYDROBROMIDE 20 MG PO TABS: 20.0000 mg | ORAL_TABLET | Freq: Every day | ORAL | 3 refills | Status: AC

## 2023-05-31 NOTE — Assessment & Plan Note (Signed)
 Start dimethicone 1% cream twice daily Advise change positions frequently to relieve pressure and use cushions or supportive surfaces. Keep the area clean and dry, apply a moisture barrier cream like dimethicone, and avoid rubbing the skin. Eating a balanced, protein-rich diet and staying hydrated also support healing

## 2023-05-31 NOTE — Patient Instructions (Signed)

## 2023-05-31 NOTE — Progress Notes (Signed)
 Established Patient Office Visit   Subjective  Patient ID: Tristan Lopez, male    DOB: 30-Aug-1930  Age: 89 y.o. MRN: [id number]  Chief Complaint  Patient presents with   Pressure Sore     On each side of buttock. Has busted open and has been bleeding. Pt. States he has had for over three months.   Would like referral to wound care or hospice assistance Would also like an increased dose of Celexa  to assist with anxiety.     He  has a past medical history of Abdominal aortic aneurysm without rupture (HCC), Bilateral renal cysts, BPH (benign prostatic hyperplasia), Carotid artery occlusion, Chronic atrial fibrillation (HCC), COPD (chronic obstructive pulmonary disease) (HCC), Diverticulosis, Essential hypertension, Hemorrhoids, Hiatal hernia, adenomatous colonic polyps, Hypercholesteremia, Liver cyst, Nephrolithiasis, Nephrolithiasis, Permanent atrial fibrillation (HCC), Pre-diabetes, Reflux esophagitis, Sleep apnea, and Tubular adenoma.  HPI Patient presents to the clinic for stage 1 pressure ulcer. For the details of today's visit, please refer to assessment and plan.   Review of Systems  Constitutional:  Negative for chills and fever.  Respiratory:  Negative for shortness of breath.   Cardiovascular:  Negative for chest pain.  Neurological:  Negative for dizziness and headaches.      Objective:     BP (!) 152/83   Pulse 83   Ht 5\' 9"  (1.753 m)   Wt 222 lb 0.6 oz (100.7 kg)   SpO2 95%   BMI 32.79 kg/m  BP Readings from Last 3 Encounters:  05/31/23 (!) 152/83  05/15/23 (!) 159/87  04/11/23 129/69      Physical Exam Vitals reviewed.  Constitutional:      General: He is not in acute distress.    Appearance: Normal appearance. He is not ill-appearing, toxic-appearing or diaphoretic.  HENT:     Head: Normocephalic.  Eyes:     General:        Right eye: No discharge.        Left eye: No discharge.     Conjunctiva/sclera: Conjunctivae normal.  Cardiovascular:      Rate and Rhythm: Normal rate.     Pulses: Normal pulses.     Heart sounds: Normal heart sounds.  Pulmonary:     Effort: Pulmonary effort is normal. No respiratory distress.     Breath sounds: Normal breath sounds.  Skin:    Findings: Erythema present.  Neurological:     Mental Status: He is alert.  Psychiatric:        Mood and Affect: Mood normal.      No results found for any visits on 05/31/23.  The ASCVD Risk score (Arnett DK, et al., 2019) failed to calculate for the following reasons:   The 2019 ASCVD risk score is only valid for ages 7 to 45    Assessment & Plan:  Pressure injury of buttock, stage 1, unspecified laterality -     Dimethicone; Apply 1 Application topically 2 (two) times daily as needed.  Dispense: 113 g; Refill: 0  Pressure ulcer of left buttock, stage 1 Assessment & Plan: Start dimethicone 1% cream twice daily Advise change positions frequently to relieve pressure and use cushions or supportive surfaces. Keep the area clean and dry, apply a moisture barrier cream like dimethicone, and avoid rubbing the skin. Eating a balanced, protein-rich diet and staying hydrated also support healing   Other orders -     Citalopram  Hydrobromide; Take 1 tablet (20 mg total) by mouth daily.  Dispense: 30 tablet; Refill:  3    Return if symptoms worsen or fail to improve.   Avelino Lek Amber Bail, FNP

## 2023-06-10 ENCOUNTER — Other Ambulatory Visit: Payer: Self-pay | Admitting: Internal Medicine

## 2023-06-10 DIAGNOSIS — E876 Hypokalemia: Secondary | ICD-10-CM

## 2023-06-11 ENCOUNTER — Ambulatory Visit

## 2023-06-11 ENCOUNTER — Telehealth: Payer: Self-pay | Admitting: Internal Medicine

## 2023-06-11 NOTE — Telephone Encounter (Signed)
 Copied from CRM [id number]. Topic: Clinical - Medical Advice >> Jun 11, 2023  9:22 AM Kevelyn M wrote: Reason for CRM: Sharmaine Dearth, patient's son is following up on the discussion about palliative care/Hospice. [phone]

## 2023-06-11 NOTE — Telephone Encounter (Signed)
Please refer to PCP 

## 2023-06-14 ENCOUNTER — Ambulatory Visit

## 2023-06-18 ENCOUNTER — Ambulatory Visit (INDEPENDENT_AMBULATORY_CARE_PROVIDER_SITE_OTHER)

## 2023-06-18 DIAGNOSIS — R339 Retention of urine, unspecified: Secondary | ICD-10-CM | POA: Diagnosis not present

## 2023-06-18 NOTE — Progress Notes (Signed)
 Cath Change/ Replacement  Patient is present today for a catheter change due to urinary retention.  10 ml of water  was removed from the balloon, a 18 FR foley cath was removed without difficulty.  Patient was cleaned and prepped in a sterile fashion with Betadinex3.  A 18  FR foley cath was replaced into the bladder, no complications were noted. Urine return was noted 25 ml and urine was Bloody in color. The balloon was filled with 10ml of sterile water . A leg bag was attached for drainage.  A night bag was also given to the patient and patient was given instruction on how to change from one bag to another. Patient was given proper instruction on catheter care.    Performed by: Gorden Latino, CMA  Follow up: 4 weeks cath change

## 2023-06-24 DIAGNOSIS — Z5181 Encounter for therapeutic drug level monitoring: Secondary | ICD-10-CM | POA: Diagnosis not present

## 2023-07-01 ENCOUNTER — Ambulatory Visit: Admitting: Internal Medicine

## 2023-07-03 ENCOUNTER — Telehealth: Payer: Self-pay | Admitting: Internal Medicine

## 2023-07-03 NOTE — Telephone Encounter (Signed)
 [facility] [facility] advised to keep referral open until patient is seen

## 2023-07-03 NOTE — Telephone Encounter (Signed)
 Please advise    Copied from CRM [id number]. Topic: General - Other >> Jul 03, 2023  2:11 PM Alpha Arts wrote: Reason for CRM: [facility] would like to know if they need to keep patient's referral open or not. Patient's brother, Jonthan, [place] stated that patient's wounds are better and are just scabs now.   Wound Care #: [id number]  Brother Callback #: [phone]

## 2023-07-04 ENCOUNTER — Ambulatory Visit: Admitting: Internal Medicine

## 2023-07-05 ENCOUNTER — Encounter: Payer: Self-pay | Admitting: Cardiology

## 2023-07-08 ENCOUNTER — Encounter: Payer: Self-pay | Admitting: Internal Medicine

## 2023-07-08 ENCOUNTER — Ambulatory Visit: Admitting: Internal Medicine

## 2023-07-08 VITALS — BP 127/72 | HR 87 | Ht 69.0 in | Wt 221.2 lb

## 2023-07-08 DIAGNOSIS — I4891 Unspecified atrial fibrillation: Secondary | ICD-10-CM

## 2023-07-08 DIAGNOSIS — L89321 Pressure ulcer of left buttock, stage 1: Secondary | ICD-10-CM | POA: Diagnosis not present

## 2023-07-08 DIAGNOSIS — F419 Anxiety disorder, unspecified: Secondary | ICD-10-CM

## 2023-07-08 DIAGNOSIS — R451 Restlessness and agitation: Secondary | ICD-10-CM

## 2023-07-08 DIAGNOSIS — I10 Essential (primary) hypertension: Secondary | ICD-10-CM

## 2023-07-08 DIAGNOSIS — K219 Gastro-esophageal reflux disease without esophagitis: Secondary | ICD-10-CM

## 2023-07-08 NOTE — Progress Notes (Signed)
 Established Patient Office Visit  Subjective   Patient ID: Tristan Lopez, male    DOB: 03-24-1930  Age: 89 y.o. MRN: [id number]  Chief Complaint  Patient presents with   Care Management    Three month follow up    Wound Check    Recheck wounds    Pallative     Discuss palliative care    Tristan Lopez returns to care today for follow-up.  He was last evaluated by me on 3/20 at which time his son reported that Tristan Lopez was becoming agitated and irritable multiple times weekly, often later in the afternoon.  Treatment options were reviewed and ultimately citalopram  10 mg daily was started.  We also discussed the importance of maintaining sleep-wake cycle regularity and pain control.  60-month follow-up was arranged for reassessment.  In the interim he has been seen by urology for follow-up.  Acute visit at [facility] on 5/9 for evaluation of a pressure injury of the left buttock.  There have otherwise been no acute interval events.  Today he reports feeling fairly well and has no acute concerns to discuss.  Mr. [name] is accompanied by his daughter, Tristan Lopez.  She would like to discuss potential home health options.  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 Dr. Myrlene Asper   COLONOSCOPY  11/01/11    Dr. Riley Cheadle- tubular adenoma,suboptimal preparation, internal hemorrhoids, o/w normal rectum. pancolonic diverticulosis   COLONOSCOPY WITH PROPOFOL  N/A 11/29/2020   Procedure: COLONOSCOPY WITH PROPOFOL ;  Surgeon: Urban Garden, MD;  Location: [facility];  Service: Gastroenterology;  Laterality: N/A;   ESOPHAGOGASTRODUODENOSCOPY  11/01/11   Dr. Riley Cheadle- erosive reflux esophagitis along with a patulous EG junction, hialtal hernia, antral erosions with possible area of healing ulceration- s/p bx= chronic erosive gastritis, no malignancy   ESOPHAGOGASTRODUODENOSCOPY (EGD) WITH PROPOFOL  N/A 11/29/2020   Procedure: ESOPHAGOGASTRODUODENOSCOPY (EGD) WITH PROPOFOL ;  Surgeon: Urban Garden, MD;  Location: [facility];  Service: Gastroenterology;  Laterality: N/A;   HERNIA REPAIR     x2, bilateral inguinal    KIDNEY STONE SURGERY     KNEE ARTHROSCOPY     X2   NASAL ENDOSCOPY     Social History   Tobacco Use   Smoking status: Former    Current packs/day: 0.00    Types: Cigarettes    Quit date: 1992    Years since quitting: 33.4   Smokeless tobacco: Never  Vaping Use   Vaping status: Never Used  Substance Use Topics   Alcohol use: No   Drug use: No   Family History  Problem Relation Age of Onset   Breast cancer  Mother    Other Mother        Essential tremor   Stroke Mother    Heart disease Father        Pacemaker   Heart attack Maternal Grandfather    Renal cancer Son    Colon cancer Neg Hx    Liver disease Neg Hx    Prostate cancer Neg Hx    Bladder Cancer Neg Hx    Kidney cancer Neg Hx    Allergies  Allergen Reactions   Latex Rash   Review of Systems  Constitutional:  Negative for chills and fever.  HENT:  Negative for sore throat.   Respiratory:  Negative for cough and shortness of breath.   Cardiovascular:  Negative for chest pain, palpitations and leg swelling.  Gastrointestinal:  Negative for abdominal pain, blood in stool, constipation, diarrhea,  nausea and vomiting.  Genitourinary:  Negative for dysuria and hematuria.  Musculoskeletal:  Negative for myalgias.  Skin:  Negative for itching and rash.  Neurological:  Negative for dizziness and headaches.  Psychiatric/Behavioral:  Negative for depression and suicidal ideas.      Objective:     BP 127/72   Pulse 87   Ht 5' 9 (1.753 m)   Wt 221 lb 3.2 oz (100.3 kg)   SpO2 97%   BMI 32.67 kg/m  BP Readings from Last 3 Encounters:  07/08/23 127/72  05/31/23 (!) 152/83  05/15/23 (!) 159/87   Physical Exam Vitals reviewed.  Constitutional:      General: He is not in acute distress.    Appearance: Normal appearance. He is not ill-appearing.  HENT:     Head: Normocephalic and atraumatic.     Right Ear: External ear normal.     Left Ear: External ear normal.     Nose: Nose normal. No congestion or rhinorrhea.     Mouth/Throat:     Mouth: Mucous membranes are moist.     Pharynx: Oropharynx is clear.   Eyes:     General: No scleral icterus.    Extraocular Movements: Extraocular movements intact.     Conjunctiva/sclera: Conjunctivae normal.     Pupils: Pupils are equal, round, and reactive to light.    Cardiovascular:     Rate and Rhythm: Normal rate. Rhythm irregular.     Pulses: Normal pulses.     Heart sounds: Normal heart sounds. No murmur heard. Pulmonary:     Effort: Pulmonary effort is normal.     Breath sounds: Normal breath sounds. No wheezing, rhonchi or rales.  Abdominal:     General: Abdomen is flat. Bowel sounds are normal. There is no distension.     Palpations: Abdomen is soft.     Tenderness: There is no abdominal tenderness.   Musculoskeletal:        General: No swelling or deformity. Normal range of motion.     Cervical back: Normal range of motion.   Skin:    General: Skin is warm and dry.     Capillary Refill: Capillary refill takes less than 2 seconds.     Findings: Lesion (Stage I pressure wound along each buttock) present.    Neurological:     General: No focal deficit present.     Mental Status: He is alert and oriented to person, place, and time.     Motor: No weakness.     Gait: Gait abnormal (Ambulates with a rollator).   Psychiatric:        Mood and Affect:  Mood normal.        Behavior: Behavior normal.        Thought Content: Thought content normal.   Last CBC Lab Results  Component Value Date   WBC 4.4 01/06/2023   HGB 12.5 (L) 01/06/2023   HCT 38.8 (L) 01/06/2023   MCV 95.1 01/06/2023   MCH 30.6 01/06/2023   RDW 13.8 01/06/2023   PLT 185 01/06/2023   Last metabolic panel Lab Results  Component Value Date   GLUCOSE 98 01/06/2023   NA 134 (L) 01/06/2023   K 3.9 01/06/2023   CL 98 01/06/2023   CO2 27 01/06/2023   BUN 16 01/06/2023   CREATININE 0.75 01/06/2023   GFRNONAA >60 01/06/2023   CALCIUM  9.0 01/06/2023   PHOS 2.5 11/29/2020   PROT 6.8 09/01/2022   ALBUMIN 3.6 09/01/2022   LABGLOB 2.4 03/27/2022   AGRATIO 1.7 03/27/2022   BILITOT 1.2 09/01/2022   ALKPHOS 50 09/01/2022   AST 18 09/01/2022   ALT 11 09/01/2022   ANIONGAP 9 01/06/2023   Last lipids Lab Results  Component Value Date   CHOL 141 10/11/2022   HDL 50 10/11/2022   LDLCALC 77 10/11/2022   TRIG 68 10/11/2022   CHOLHDL 2.8 10/11/2022   Last hemoglobin A1c Lab Results  Component Value Date   HGBA1C 5.4 03/27/2022   Last thyroid functions Lab Results  Component Value Date   TSH 1.610 03/27/2022   Last vitamin D  Lab Results  Component Value Date   VD25OH 40.9 03/27/2022   Last vitamin B12 and Folate Lab Results  Component Value Date   VITAMINB12 298 03/27/2022   FOLATE 5.2 03/27/2022     Assessment & Plan:   Problem List Items Addressed This Visit       Atrial fibrillation (HCC) - Primary   Every newly irregular rate and rhythm detected on exam again today.  He remains on propranolol  and warfarin.  Cardiology follow-up is scheduled for 7/10.      Essential hypertension   Adequately can  hold on current antihypertensive regimen.      GERD (gastroesophageal reflux disease)   Symptoms remain well-controlled with pantoprazole .      Anxiety with agitation   Previously evaluated by me in the setting of anxiety with agitation.  Citalopram  10 mg daily was started and was then further increased to 20 mg daily.  Mr. Sprung daughter, Tristan Lopez, states that this has been beneficial.  She does not believe that any additional medication changes are indicated at this time.      Pressure ulcer of left buttock, stage 1   Stage I pressure injury of both buttocks noted on exam today.  There is no surrounding erythema or purulence noted. Mr. Arndt family is alternating use of dimethicone cream and a zinc based ointment.  Family is interested in home health options.  Will place home health referral today.      Return in about 3 months (around 10/08/2023).   Tobi Fortes, MD

## 2023-07-08 NOTE — Assessment & Plan Note (Signed)
 Symptoms remain well-controlled with pantoprazole

## 2023-07-08 NOTE — Assessment & Plan Note (Signed)
 Stage I pressure injury of both buttocks noted on exam today.  There is no surrounding erythema or purulence noted. Tristan Lopez family is alternating use of dimethicone cream and a zinc based ointment.  Family is interested in home health options.  Will place home health referral today.

## 2023-07-08 NOTE — Assessment & Plan Note (Signed)
 Every newly irregular rate and rhythm detected on exam again today.  He remains on propranolol  and warfarin.  Cardiology follow-up is scheduled for 7/10.

## 2023-07-08 NOTE — Patient Instructions (Signed)
 It was a pleasure to see you today.  Thank you for giving us  the opportunity to be involved in your care.  Below is a brief recap of your visit and next steps.  We will plan to see you again in 3 months.  Summary No medication changes today Repeat labs ordered Continue Celexa  as currently prescribed  Continue current wound care measures Will follow up with palliative care liaison Follow up in 3 months

## 2023-07-08 NOTE — Assessment & Plan Note (Signed)
 Adequately can hold on current antihypertensive regimen.

## 2023-07-08 NOTE — Assessment & Plan Note (Signed)
 Previously evaluated by me in the setting of anxiety with agitation.  Citalopram  10 mg daily was started and was then further increased to 20 mg daily.  Tristan Lopez daughter, Tristan Lopez, states that this has been beneficial.  She does not believe that any additional medication changes are indicated at this time.

## 2023-07-09 ENCOUNTER — Telehealth: Payer: Self-pay | Admitting: Cardiology

## 2023-07-09 ENCOUNTER — Ambulatory Visit: Payer: Self-pay | Admitting: Internal Medicine

## 2023-07-09 DIAGNOSIS — I4891 Unspecified atrial fibrillation: Secondary | ICD-10-CM

## 2023-07-09 LAB — BASIC METABOLIC PANEL WITH GFR
BUN/Creatinine Ratio: 16 (ref 10–24)
BUN: 12 mg/dL (ref 10–36)
CO2: 23 mmol/L (ref 20–29)
Calcium: 9.3 mg/dL (ref 8.6–10.2)
Chloride: 96 mmol/L (ref 96–106)
Creatinine, Ser: 0.73 mg/dL — ABNORMAL LOW (ref 0.76–1.27)
Glucose: 89 mg/dL (ref 70–99)
Potassium: 4.8 mmol/L (ref 3.5–5.2)
Sodium: 136 mmol/L (ref 134–144)
eGFR: 86 mL/min/{1.73_m2} (ref >59)

## 2023-07-09 LAB — CBC WITH DIFFERENTIAL/PLATELET
Basophils Absolute: 0 10*3/uL (ref 0.0–0.2)
Basos: 0 %
EOS (ABSOLUTE): 0.2 10*3/uL (ref 0.0–0.4)
Eos: 3 %
Hematocrit: 36.8 % — ABNORMAL LOW (ref 37.5–51.0)
Hemoglobin: 11.8 g/dL — ABNORMAL LOW (ref 13.0–17.7)
Immature Grans (Abs): 0 10*3/uL (ref 0.0–0.1)
Immature Granulocytes: 0 %
Lymphocytes Absolute: 1.6 10*3/uL (ref 0.7–3.1)
Lymphs: 29 %
MCH: 30.1 pg (ref 26.6–33.0)
MCHC: 32.1 g/dL (ref 31.5–35.7)
MCV: 94 fL (ref 79–97)
Monocytes Absolute: 0.8 10*3/uL (ref 0.1–0.9)
Monocytes: 14 %
Neutrophils Absolute: 2.9 10*3/uL (ref 1.4–7.0)
Neutrophils: 54 %
Platelets: 242 10*3/uL (ref 150–450)
RBC: 3.92 x10E6/uL — ABNORMAL LOW (ref 4.14–5.80)
RDW: 13.1 % (ref 11.6–15.4)
WBC: 5.4 10*3/uL (ref 3.4–10.8)

## 2023-07-09 LAB — TSH+FREE T4
Free T4: 1.7 ng/dL (ref 0.82–1.77)
TSH: 1.15 u[IU]/mL (ref 0.450–4.500)

## 2023-07-09 LAB — B12 AND FOLATE PANEL
Folate: 8 ng/mL (ref >3.0)
Vitamin B-12: 466 pg/mL (ref 232–1245)

## 2023-07-09 LAB — VITAMIN D 25 HYDROXY (VIT D DEFICIENCY, FRACTURES): Vit D, 25-Hydroxy: 42.8 ng/mL (ref 30.0–100.0)

## 2023-07-09 NOTE — Telephone Encounter (Signed)
 Son Tristan Lopez) stated he is returning RN's call regarding [facility] Coumadin  results from 6/2.  Son wants to know if nurse can follow-up with [facility] to get results.

## 2023-07-09 NOTE — Telephone Encounter (Addendum)
 I returned call and spoke with Tristan Lopez, pt's son. I apologized that no one had called him or followed up on the patient's results that were drawn on 06/24/23. Pt states he reached out to [facility] coumadin  clinic multiple times and also tried to call [facility] to try and find INR results. Pt also sent MyChart message on 07/05/23 requesting INR results. INR results can always be found under [facility] DXA tab in Epic or can be found by calling [facility] if there are any issues. I let Tristan Lopez know INR was 2.5 on 06/24/23. Confirmed pt has continued to take warfarin 5mg  daily except 2.5mg  on Sundays - denies changes in medication or diet, hospitalizations or upcoming procedures. Pt will recheck 6 weeks from previous INR (08/05/22). I ordered and released a lab order for STAT PT/INR to be drawn at [facility] in [place]. Tristan Lopez also requesting for these orders to be mailed - I will forward message to Claudean Crumbly, RN for this.

## 2023-07-10 ENCOUNTER — Encounter (HOSPITAL_COMMUNITY): Payer: Self-pay

## 2023-07-10 ENCOUNTER — Emergency Department (HOSPITAL_COMMUNITY)

## 2023-07-10 ENCOUNTER — Other Ambulatory Visit: Payer: Self-pay

## 2023-07-10 ENCOUNTER — Inpatient Hospital Stay (HOSPITAL_COMMUNITY)
Admission: EM | Admit: 2023-07-10 | Discharge: 2023-07-15 | DRG: 815 | Disposition: A | Discharge status: Skilled Nursing Facility | Attending: Family Medicine | Admitting: Family Medicine

## 2023-07-10 DIAGNOSIS — Z66 Do not resuscitate: Secondary | ICD-10-CM | POA: Diagnosis present

## 2023-07-10 DIAGNOSIS — E1122 Type 2 diabetes mellitus with diabetic chronic kidney disease: Secondary | ICD-10-CM | POA: Diagnosis present

## 2023-07-10 DIAGNOSIS — I10 Essential (primary) hypertension: Secondary | ICD-10-CM | POA: Diagnosis not present

## 2023-07-10 DIAGNOSIS — R262 Difficulty in walking, not elsewhere classified: Secondary | ICD-10-CM | POA: Diagnosis present

## 2023-07-10 DIAGNOSIS — Z79899 Other long term (current) drug therapy: Secondary | ICD-10-CM

## 2023-07-10 DIAGNOSIS — E66811 Obesity, class 1: Secondary | ICD-10-CM | POA: Diagnosis present

## 2023-07-10 DIAGNOSIS — Z8249 Family history of ischemic heart disease and other diseases of the circulatory system: Secondary | ICD-10-CM

## 2023-07-10 DIAGNOSIS — T83511A Infection and inflammatory reaction due to indwelling urethral catheter, initial encounter: Secondary | ICD-10-CM

## 2023-07-10 DIAGNOSIS — Z936 Other artificial openings of urinary tract status: Secondary | ICD-10-CM

## 2023-07-10 DIAGNOSIS — Z860101 Personal history of adenomatous and serrated colon polyps: Secondary | ICD-10-CM

## 2023-07-10 DIAGNOSIS — I129 Hypertensive chronic kidney disease with stage 1 through stage 4 chronic kidney disease, or unspecified chronic kidney disease: Secondary | ICD-10-CM | POA: Diagnosis not present

## 2023-07-10 DIAGNOSIS — Z7901 Long term (current) use of anticoagulants: Secondary | ICD-10-CM | POA: Diagnosis not present

## 2023-07-10 DIAGNOSIS — A419 Sepsis, unspecified organism: Secondary | ICD-10-CM | POA: Diagnosis not present

## 2023-07-10 DIAGNOSIS — N401 Enlarged prostate with lower urinary tract symptoms: Secondary | ICD-10-CM | POA: Diagnosis present

## 2023-07-10 DIAGNOSIS — J449 Chronic obstructive pulmonary disease, unspecified: Secondary | ICD-10-CM | POA: Diagnosis present

## 2023-07-10 DIAGNOSIS — Z6833 Body mass index (BMI) 33.0-33.9, adult: Secondary | ICD-10-CM | POA: Diagnosis not present

## 2023-07-10 DIAGNOSIS — I4891 Unspecified atrial fibrillation: Secondary | ICD-10-CM | POA: Diagnosis not present

## 2023-07-10 DIAGNOSIS — I700 Atherosclerosis of aorta: Secondary | ICD-10-CM | POA: Diagnosis not present

## 2023-07-10 DIAGNOSIS — E7800 Pure hypercholesterolemia, unspecified: Secondary | ICD-10-CM | POA: Diagnosis present

## 2023-07-10 DIAGNOSIS — E871 Hypo-osmolality and hyponatremia: Secondary | ICD-10-CM | POA: Diagnosis not present

## 2023-07-10 DIAGNOSIS — K219 Gastro-esophageal reflux disease without esophagitis: Secondary | ICD-10-CM | POA: Diagnosis present

## 2023-07-10 DIAGNOSIS — I714 Abdominal aortic aneurysm, without rupture, unspecified: Secondary | ICD-10-CM | POA: Diagnosis present

## 2023-07-10 DIAGNOSIS — E876 Hypokalemia: Secondary | ICD-10-CM | POA: Diagnosis present

## 2023-07-10 DIAGNOSIS — Z823 Family history of stroke: Secondary | ICD-10-CM

## 2023-07-10 DIAGNOSIS — R509 Fever, unspecified: Secondary | ICD-10-CM | POA: Diagnosis not present

## 2023-07-10 DIAGNOSIS — N400 Enlarged prostate without lower urinary tract symptoms: Secondary | ICD-10-CM | POA: Diagnosis present

## 2023-07-10 DIAGNOSIS — G4733 Obstructive sleep apnea (adult) (pediatric): Secondary | ICD-10-CM | POA: Diagnosis not present

## 2023-07-10 DIAGNOSIS — G4730 Sleep apnea, unspecified: Secondary | ICD-10-CM | POA: Diagnosis not present

## 2023-07-10 DIAGNOSIS — D72829 Elevated white blood cell count, unspecified: Secondary | ICD-10-CM | POA: Diagnosis not present

## 2023-07-10 DIAGNOSIS — Z87442 Personal history of urinary calculi: Secondary | ICD-10-CM

## 2023-07-10 DIAGNOSIS — I119 Hypertensive heart disease without heart failure: Secondary | ICD-10-CM | POA: Diagnosis not present

## 2023-07-10 DIAGNOSIS — R531 Weakness: Secondary | ICD-10-CM | POA: Diagnosis present

## 2023-07-10 DIAGNOSIS — Z8051 Family history of malignant neoplasm of kidney: Secondary | ICD-10-CM

## 2023-07-10 DIAGNOSIS — Z7401 Bed confinement status: Secondary | ICD-10-CM | POA: Diagnosis not present

## 2023-07-10 DIAGNOSIS — F0390 Unspecified dementia without behavioral disturbance: Secondary | ICD-10-CM | POA: Diagnosis not present

## 2023-07-10 DIAGNOSIS — I482 Chronic atrial fibrillation, unspecified: Secondary | ICD-10-CM | POA: Diagnosis not present

## 2023-07-10 DIAGNOSIS — I4821 Permanent atrial fibrillation: Secondary | ICD-10-CM | POA: Diagnosis present

## 2023-07-10 DIAGNOSIS — N390 Urinary tract infection, site not specified: Secondary | ICD-10-CM | POA: Diagnosis not present

## 2023-07-10 DIAGNOSIS — N182 Chronic kidney disease, stage 2 (mild): Secondary | ICD-10-CM | POA: Diagnosis present

## 2023-07-10 DIAGNOSIS — Z91040 Latex allergy status: Secondary | ICD-10-CM

## 2023-07-10 DIAGNOSIS — D631 Anemia in chronic kidney disease: Secondary | ICD-10-CM | POA: Diagnosis present

## 2023-07-10 DIAGNOSIS — R109 Unspecified abdominal pain: Secondary | ICD-10-CM | POA: Diagnosis not present

## 2023-07-10 DIAGNOSIS — R2681 Unsteadiness on feet: Secondary | ICD-10-CM | POA: Diagnosis present

## 2023-07-10 DIAGNOSIS — Z803 Family history of malignant neoplasm of breast: Secondary | ICD-10-CM

## 2023-07-10 DIAGNOSIS — E785 Hyperlipidemia, unspecified: Secondary | ICD-10-CM | POA: Diagnosis present

## 2023-07-10 DIAGNOSIS — Z87891 Personal history of nicotine dependence: Secondary | ICD-10-CM

## 2023-07-10 DIAGNOSIS — N3281 Overactive bladder: Secondary | ICD-10-CM | POA: Diagnosis not present

## 2023-07-10 DIAGNOSIS — Z8711 Personal history of peptic ulcer disease: Secondary | ICD-10-CM

## 2023-07-10 LAB — URINALYSIS, W/ REFLEX TO CULTURE (INFECTION SUSPECTED)
Bilirubin Urine: NEGATIVE
Glucose, UA: NEGATIVE mg/dL
Hgb urine dipstick: NEGATIVE
Ketones, ur: 5 mg/dL — AB
Nitrite: NEGATIVE
Protein, ur: 100 mg/dL — AB
Specific Gravity, Urine: 1.023 (ref 1.005–1.030)
WBC, UA: >50 WBC/hpf (ref 0–5)
pH: 7 (ref 5.0–8.0)

## 2023-07-10 LAB — COMPREHENSIVE METABOLIC PANEL WITH GFR
ALT: 15 U/L (ref 0–44)
AST: 20 U/L (ref 15–41)
Albumin: 3.3 g/dL — ABNORMAL LOW (ref 3.5–5.0)
Alkaline Phosphatase: 71 U/L (ref 38–126)
Anion gap: 11 (ref 5–15)
BUN: 10 mg/dL (ref 8–23)
CO2: 25 mmol/L (ref 22–32)
Calcium: 8.9 mg/dL (ref 8.9–10.3)
Chloride: 96 mmol/L — ABNORMAL LOW (ref 98–111)
Creatinine, Ser: 0.72 mg/dL (ref 0.61–1.24)
GFR, Estimated: >60 mL/min (ref >60)
Glucose, Bld: 142 mg/dL — ABNORMAL HIGH (ref 70–99)
Potassium: 3.7 mmol/L (ref 3.5–5.1)
Sodium: 132 mmol/L — ABNORMAL LOW (ref 135–145)
Total Bilirubin: 1 mg/dL (ref 0.0–1.2)
Total Protein: 7 g/dL (ref 6.5–8.1)

## 2023-07-10 LAB — CBC WITH DIFFERENTIAL/PLATELET
Abs Immature Granulocytes: 0.04 K/uL (ref 0.00–0.07)
Basophils Absolute: 0 K/uL (ref 0.0–0.1)
Basophils Relative: 0 %
Eosinophils Absolute: 0 K/uL (ref 0.0–0.5)
Eosinophils Relative: 0 %
HCT: 36.1 % — ABNORMAL LOW (ref 39.0–52.0)
Hemoglobin: 11.6 g/dL — ABNORMAL LOW (ref 13.0–17.0)
Immature Granulocytes: 0 %
Lymphocytes Relative: 6 %
Lymphs Abs: 0.8 K/uL (ref 0.7–4.0)
MCH: 29.9 pg (ref 26.0–34.0)
MCHC: 32.1 g/dL (ref 30.0–36.0)
MCV: 93 fL (ref 80.0–100.0)
Monocytes Absolute: 0.9 K/uL (ref 0.1–1.0)
Monocytes Relative: 7 %
Neutro Abs: 11.5 K/uL — ABNORMAL HIGH (ref 1.7–7.7)
Neutrophils Relative %: 87 %
Platelets: 216 K/uL (ref 150–400)
RBC: 3.88 MIL/uL — ABNORMAL LOW (ref 4.22–5.81)
RDW: 14.2 % (ref 11.5–15.5)
WBC: 13.4 K/uL — ABNORMAL HIGH (ref 4.0–10.5)
nRBC: 0 % (ref 0.0–0.2)

## 2023-07-10 LAB — RESP PANEL BY RT-PCR (RSV, FLU A&B, COVID)  RVPGX2
Influenza A by PCR: NEGATIVE
Influenza B by PCR: NEGATIVE
Resp Syncytial Virus by PCR: NEGATIVE
SARS Coronavirus 2 by RT PCR: NEGATIVE

## 2023-07-10 LAB — PROTIME-INR
INR: 2.6 — ABNORMAL HIGH (ref 0.8–1.2)
Prothrombin Time: 27.7 s — ABNORMAL HIGH (ref 11.4–15.2)

## 2023-07-10 LAB — LACTIC ACID, PLASMA: Lactic Acid, Venous: 1.1 mmol/L (ref 0.5–1.9)

## 2023-07-10 MED ORDER — SACCHAROMYCES BOULARDII 250 MG PO CAPS
250.0000 mg | ORAL_CAPSULE | Freq: Two times a day (BID) | ORAL | Status: DC
  Administered 2023-07-10 – 2023-07-15 (×10): 250 mg via ORAL
  Filled 2023-07-10 (×11): qty 1

## 2023-07-10 MED ORDER — WARFARIN SODIUM 5 MG PO TABS
5.0000 mg | ORAL_TABLET | Freq: Once | ORAL | Status: AC
  Administered 2023-07-10: 5 mg via ORAL
  Filled 2023-07-10: qty 1

## 2023-07-10 MED ORDER — SODIUM CHLORIDE 0.9 % IV SOLN
2.0000 g | Freq: Once | INTRAVENOUS | Status: AC
  Administered 2023-07-10: 2 g via INTRAVENOUS
  Filled 2023-07-10: qty 20

## 2023-07-10 MED ORDER — MIRABEGRON ER 25 MG PO TB24
25.0000 mg | ORAL_TABLET | Freq: Every day | ORAL | Status: DC
  Administered 2023-07-11 – 2023-07-15 (×5): 25 mg via ORAL
  Filled 2023-07-10 (×6): qty 1

## 2023-07-10 MED ORDER — CITALOPRAM HYDROBROMIDE 20 MG PO TABS
20.0000 mg | ORAL_TABLET | Freq: Every day | ORAL | Status: DC
  Administered 2023-07-10 – 2023-07-14 (×5): 20 mg via ORAL
  Filled 2023-07-10 (×5): qty 1

## 2023-07-10 MED ORDER — POTASSIUM CITRATE ER 10 MEQ (1080 MG) PO TBCR
10.0000 meq | EXTENDED_RELEASE_TABLET | Freq: Every day | ORAL | Status: DC
  Filled 2023-07-10 (×2): qty 1

## 2023-07-10 MED ORDER — BENAZEPRIL HCL 20 MG PO TABS: 40.0000 mg | ORAL_TABLET | Freq: Every day | ORAL | Status: DC

## 2023-07-10 MED ORDER — VITAMIN C 500 MG PO TABS
1000.0000 mg | ORAL_TABLET | Freq: Every day | ORAL | Status: DC
  Administered 2023-07-11 – 2023-07-14 (×4): 1000 mg via ORAL
  Filled 2023-07-10 (×7): qty 2

## 2023-07-10 MED ORDER — TADALAFIL 5 MG PO TABS
5.0000 mg | ORAL_TABLET | Freq: Every day | ORAL | Status: DC
  Filled 2023-07-10 (×2): qty 1

## 2023-07-10 MED ORDER — SODIUM CHLORIDE 0.9 % IV BOLUS
1000.0000 mL | Freq: Once | INTRAVENOUS | Status: AC
  Administered 2023-07-10: 1000 mL via INTRAVENOUS

## 2023-07-10 MED ORDER — HYDRALAZINE HCL 20 MG/ML IJ SOLN: 5.0000 mg | INTRAMUSCULAR | Status: DC | PRN

## 2023-07-10 MED ORDER — IOHEXOL 300 MG/ML  SOLN
100.0000 mL | Freq: Once | INTRAMUSCULAR | Status: AC | PRN
  Administered 2023-07-10: 100 mL via INTRAVENOUS

## 2023-07-10 MED ORDER — VITAMIN D 25 MCG (1000 UNIT) PO TABS
2000.0000 [IU] | ORAL_TABLET | Freq: Every day | ORAL | Status: DC
  Administered 2023-07-11 – 2023-07-15 (×5): 2000 [IU] via ORAL
  Filled 2023-07-10 (×6): qty 2

## 2023-07-10 MED ORDER — AMLODIPINE BESYLATE 5 MG PO TABS: 5.0000 mg | ORAL_TABLET | Freq: Every day | ORAL | Status: DC

## 2023-07-10 MED ORDER — PROPRANOLOL HCL 20 MG PO TABS
20.0000 mg | ORAL_TABLET | Freq: Every day | ORAL | Status: DC
  Administered 2023-07-11 – 2023-07-15 (×5): 20 mg via ORAL
  Filled 2023-07-10 (×6): qty 1

## 2023-07-10 MED ORDER — FUROSEMIDE 20 MG PO TABS
20.0000 mg | ORAL_TABLET | Freq: Every day | ORAL | Status: DC
  Filled 2023-07-10: qty 1

## 2023-07-10 MED ORDER — ROSUVASTATIN CALCIUM 10 MG PO TABS
10.0000 mg | ORAL_TABLET | Freq: Every day | ORAL | Status: DC
  Administered 2023-07-10 – 2023-07-14 (×5): 10 mg via ORAL
  Filled 2023-07-10 (×5): qty 1

## 2023-07-10 MED ORDER — PANTOPRAZOLE SODIUM 40 MG PO TBEC
40.0000 mg | DELAYED_RELEASE_TABLET | Freq: Every day | ORAL | Status: DC
  Administered 2023-07-11 – 2023-07-15 (×5): 40 mg via ORAL
  Filled 2023-07-10 (×6): qty 1

## 2023-07-10 MED ORDER — FERROUS SULFATE 325 (65 FE) MG PO TABS
325.0000 mg | ORAL_TABLET | Freq: Every day | ORAL | Status: DC
  Administered 2023-07-11 – 2023-07-14 (×4): 325 mg via ORAL
  Filled 2023-07-10 (×4): qty 1

## 2023-07-10 MED ORDER — WARFARIN - PHARMACIST DOSING INPATIENT: Freq: Every day | Status: DC

## 2023-07-10 MED ORDER — SODIUM CHLORIDE 0.9 % IV SOLN
2.0000 g | INTRAVENOUS | Status: DC
  Administered 2023-07-11 – 2023-07-12 (×2): 2 g via INTRAVENOUS
  Filled 2023-07-10 (×2): qty 20

## 2023-07-10 MED ORDER — POLYETHYLENE GLYCOL 3350 17 G PO PACK: 17.0000 g | PACK | Freq: Every day | ORAL | Status: DC | PRN

## 2023-07-10 MED ORDER — DIGESTIVE ADVANTAGE PO CAPS: ORAL_CAPSULE | Freq: Two times a day (BID) | ORAL | Status: DC

## 2023-07-10 MED ORDER — AMLODIPINE BESYLATE 5 MG PO TABS: 5.0000 mg | ORAL_TABLET | Freq: Every evening | ORAL | Status: DC

## 2023-07-10 MED ORDER — BISACODYL 5 MG PO TBEC
5.0000 mg | DELAYED_RELEASE_TABLET | Freq: Every day | ORAL | Status: DC | PRN
Start: 2023-07-10 — End: 2023-07-15

## 2023-07-10 NOTE — Progress Notes (Signed)
 RN was in patient's room, called RN about CPAP.  RN had spoken with patient's daughter, patient does not use at home.  RN will be monitoring patient sats, patient is currently on RA.

## 2023-07-10 NOTE — ED Triage Notes (Addendum)
 Pt bib ems for fever of 102.8 and weakness that started today. Pt normally able to walk to bathroom and has done so earlier today but, is now experiencing weakness since 3 pm. Pt disoriented to time and place. Pt had a fall 3weeks ago, bruised noted on abdominal area. Given 325 tylenol  pta

## 2023-07-10 NOTE — H&P (Signed)
 [facility] H&P   Patient Demographics:    Tristan Lopez, is a 89 y.o. male  MRN: [id number]   DOB - [birth year]  Admit Date - 07/10/2023  Outpatient Primary MD for the patient is Alison Irvine, FNP  Referring MD/NP/PA: Dr. Bryna Car  Outpatient Specialists: Urology Dr. Claretta Croft  Patient coming from: Home  Chief Complaint  Patient presents with   Weakness   Fever      HPI:    Tristan Lopez  is a 89 y.o. male, with past medical history relevant for HTN, history of pancolonic diverticulosis, history of reflux esophagitis and gastric ulcer, obesity and OSA, BPH with LUTS requiring chronic indwelling Foley, COPD, chronic atrial fibrillation on chronic anticoagulation with Coumadin  . - Patient was brought to ED by EMS, daughter at bedside, assists with the history, he is living home by himself, but he is having 24-hour care, patient has been with progressive weakness, unsteady gait, has been more confused his baseline, has poor appetite, so EMS were called, when they were there noted him to have fever 102.8 so he was brought to ED for further evaluation, he is with known history of chronic Foley catheter, followed by Dr. Claretta Croft, exchanged every 30 days, next exchange is on Monday. - In ED he had fever 99.9, initially blood pressure soft but responded to fluids, he has been strongly positive, CT abdomen pelvis significant for known AAA, unchanged, stable benign hepatic masses, patient was started on IV antibiotics and [facility] hospitalist consulted to admit    Review of systems:    Patient himself is very poor historian secondary to advanced dementia, review of system as above has been obtained from daughter.   With Past History of the following :    Past Medical History:  Diagnosis Date   Abdominal aortic aneurysm without rupture (HCC)    Bilateral renal cysts    BPH (benign prostatic  hyperplasia)    Carotid artery occlusion    Chronic atrial fibrillation (HCC)    COPD (chronic obstructive pulmonary disease) (HCC)    Diverticulosis    Pancolonic   Essential hypertension    Hemorrhoids    Hiatal hernia    Hx of adenomatous colonic polyps    Hypercholesteremia    Liver cyst    Benign by liver biopsy September 2013   Nephrolithiasis    Nephrolithiasis    Permanent atrial fibrillation (HCC)    Pre-diabetes    Reflux esophagitis    Sleep apnea    Uses CPAP   Tubular adenoma       Past Surgical History:  Procedure Laterality Date   COLONOSCOPY     2003, no polyps per patient. Dr. Myrlene Asper   COLONOSCOPY  11/01/11   Dr. Riley Cheadle- tubular adenoma,suboptimal preparation, internal hemorrhoids, o/w normal rectum. pancolonic diverticulosis   COLONOSCOPY WITH PROPOFOL  N/A 11/29/2020   Procedure: COLONOSCOPY WITH PROPOFOL ;  Surgeon: Urban Garden,  MD;  Location: [facility];  Service: Gastroenterology;  Laterality: N/A;   ESOPHAGOGASTRODUODENOSCOPY  11/01/11   Dr. Riley Cheadle- erosive reflux esophagitis along with a patulous EG junction, hialtal hernia, antral erosions with possible area of healing ulceration- s/p bx= chronic erosive gastritis, no malignancy   ESOPHAGOGASTRODUODENOSCOPY (EGD) WITH PROPOFOL  N/A 11/29/2020   Procedure: ESOPHAGOGASTRODUODENOSCOPY (EGD) WITH PROPOFOL ;  Surgeon: Urban Garden, MD;  Location: [facility];  Service: Gastroenterology;  Laterality: N/A;   HERNIA REPAIR     x2, bilateral inguinal    KIDNEY STONE SURGERY     KNEE ARTHROSCOPY     X2   NASAL ENDOSCOPY        Social History:     Social History   Tobacco Use   Smoking status: Former    Current packs/day: 0.00    Types: Cigarettes    Quit date: 1992    Years since quitting: 33.4   Smokeless tobacco: Never  Substance Use Topics   Alcohol use: No       Family History :     Family History  Problem Relation Age of Onset   Breast cancer Mother     Other Mother        Essential tremor   Stroke Mother    Heart disease Father        Pacemaker   Heart attack Maternal Grandfather    Renal cancer Son    Colon cancer Neg Hx    Liver disease Neg Hx    Prostate cancer Neg Hx    Bladder Cancer Neg Hx    Kidney cancer Neg Hx       Home Medications:   Prior to Admission medications   Medication Sig Start Date End Date Taking? Authorizing Provider  acetaminophen  (TYLENOL ) 325 MG tablet Take 325-650 mg by mouth every 6 (six) hours as needed for headache or mild pain (pain score 1-3).   Yes [provider]  amLODipine  (NORVASC ) 5 MG tablet TAKE 1 TABLET (5 MG TOTAL) BY MOUTH DAILY. 01/03/23  Yes Tobi Fortes, MD  Ascorbic Acid (VITAMIN C) 1000 MG tablet Take 1,000 mg by mouth daily.   Yes [provider]  benazepril  (LOTENSIN ) 40 MG tablet TAKE 1 TABLET (40 MG TOTAL) BY MOUTH DAILY. 02/22/23  Yes Strader, Dimple Francis, PA-C  Cholecalciferol (VITAMIN D -3) 1000 units CAPS Take 2,000 Units by mouth daily.   Yes [provider]  citalopram  (CELEXA ) 20 MG tablet Take 1 tablet (20 mg total) by mouth daily. 05/31/23  Yes Del Orbe Polanco, Rogerio Clay, FNP  dimethicone 1 % cream Apply 1 Application topically 2 (two) times daily as needed. Patient taking differently: Apply 1 Application topically 2 (two) times daily as needed for dry skin. 05/31/23  Yes Del Amber Bail, Rogerio Clay, FNP  FEROSUL 325 (65 Fe) MG tablet TAKE ONE TABLET BY MOUTH EVERY DAY 08/03/22  Yes Dixon, Phillip E, MD  furosemide  (LASIX ) 20 MG tablet TAKE ONE TABLET BY MOUTH DAILY AS NEEDED FOR LEG SWELLING Patient taking differently: Take 20 mg by mouth daily. 06/10/23  Yes Tobi Fortes, MD  Garlic 1000 MG CAPS Take 1,000 mg by mouth daily.   Yes [provider]  mirabegron  ER (MYRBETRIQ ) 25 MG TB24 tablet Take 1 tablet (25 mg total) by mouth daily. 05/15/23  Yes McKenzie, Arden Beck, MD  pantoprazole  (PROTONIX ) 40 MG tablet TAKE ONE TABLET BY MOUTH EVERY DAY  05/13/23  Yes Tobi Fortes, MD  potassium citrate  (UROCIT-K )  10 MEQ (1080 MG) SR tablet TAKE ONE TABLET BY MOUTH EVERY DAY 06/10/23  Yes Dixon, Phillip E, MD  Probiotic Product (DIGESTIVE ADVANTAGE PO) Take 1 capsule by mouth 2 (two) times daily.   Yes [provider]  propranolol  (INDERAL ) 20 MG tablet TAKE 1 TABLET (20 MG TOTAL) BY MOUTH DAILY. 11/01/22  Yes Gerard Knight, MD  rosuvastatin  (CRESTOR ) 10 MG tablet TAKE 1 TABLET (10 MG TOTAL) BY MOUTH DAILY. 02/22/23  Yes Strader, [place] M, PA-C  tadalafil  (CIALIS ) 5 MG tablet TAKE ONE TABLET BY MOUTH EVERY DAY 06/10/23  Yes Dixon, Phillip E, MD  warfarin (COUMADIN ) 5 MG tablet TAKE 1 TABLET (5 MG TOTAL) BY MOUTH DAILY. 04/11/23  Yes Tobi Fortes, MD  UNABLE TO FIND 1 each by Does not apply route daily. Med Name: LIFT CHAIR 04/15/23   Tobi Fortes, MD     Allergies:     Allergies  Allergen Reactions   Latex Dermatitis and Rash     Physical Exam:   Vitals  Blood pressure 139/74, pulse 83, temperature 99 F (37.2 C), temperature source Oral, resp. rate 18, SpO2 100%.   1. General Well-developed male, laying in bed, no apparent distress  2.  Awake, oriented x 1, demented, pleasant.    3. No F.N deficits, ALL C.Nerves Intact, Strength 5/5 all 4 extremities, Sensation intact all 4 extremities, Plantars down going.  4. Ears and Eyes appear Normal, Conjunctivae clear, PERRLA. Moist Oral Mucosa.  5. Supple Neck, No JVD, No cervical lymphadenopathy appriciated, No Carotid Bruits.  6. Symmetrical Chest wall movement, Good air movement bilaterally, CTAB.  7.  Irregular irregular, No Gallops, Rubs or Murmurs, No Parasternal Heave.  8. Positive Bowel Sounds, Abdomen Soft, No tenderness, No organomegaly appriciated,No rebound -guarding or rigidity.  9.  No Cyanosis, Normal Skin Turgor, No Skin Rash or Bruise.  10. Good muscle tone,  joints appear normal , no effusions, Normal ROM.   Data Review:    CBC Recent  Labs  Lab 07/08/23 1116 07/10/23 1719  WBC 5.4 13.4*  HGB 11.8* 11.6*  HCT 36.8* 36.1*  PLT 242 216  MCV 94 93.0  MCH 30.1 29.9  MCHC 32.1 32.1  RDW 13.1 14.2  LYMPHSABS 1.6 0.8  MONOABS  --  0.9  EOSABS 0.2 0.0  BASOSABS 0.0 0.0   ------------------------------------------------------------------------------------------------------------------  Chemistries  Recent Labs  Lab 07/08/23 1116 07/10/23 1719  NA 136 132*  K 4.8 3.7  CL 96 96*  CO2 23 25  GLUCOSE 89 142*  BUN 12 10  CREATININE 0.73* 0.72  CALCIUM  9.3 8.9  AST  --  20  ALT  --  15  ALKPHOS  --  71  BILITOT  --  1.0   ------------------------------------------------------------------------------------------------------------------ estimated creatinine clearance is 71.6 mL/min (by C-G formula based on SCr of 0.72 mg/dL). ------------------------------------------------------------------------------------------------------------------ Recent Labs    07/08/23 1116  TSH 1.150    Coagulation profile Recent Labs  Lab 07/10/23 1719  INR 2.6*   ------------------------------------------------------------------------------------------------------------------- No results for input(s): DDIMER in the last 72 hours. -------------------------------------------------------------------------------------------------------------------  Cardiac Enzymes No results for input(s): CKMB, TROPONINI, MYOGLOBIN in the last 168 hours.  Invalid input(s): CK ------------------------------------------------------------------------------------------------------------------ No results found for: BNP   ---------------------------------------------------------------------------------------------------------------  Urinalysis    Component Value Date/Time   COLORURINE AMBER (A) 07/10/2023 1825   APPEARANCEUR CLOUDY (A) 07/10/2023 1825   APPEARANCEUR Cloudy (A) 05/10/2021 1001   LABSPEC 1.023 07/10/2023 1825    PHURINE 7.0 07/10/2023 1825  GLUCOSEU NEGATIVE 07/10/2023 1825   HGBUR NEGATIVE 07/10/2023 1825   BILIRUBINUR NEGATIVE 07/10/2023 1825   BILIRUBINUR Negative 05/10/2021 1001   KETONESUR 5 (A) 07/10/2023 1825   PROTEINUR 100 (A) 07/10/2023 1825   UROBILINOGEN 0.2 10/04/2011 2218   NITRITE NEGATIVE 07/10/2023 1825   LEUKOCYTESUR TRACE (A) 07/10/2023 1825    ----------------------------------------------------------------------------------------------------------------   Imaging Results:    CT ABDOMEN PELVIS W CONTRAST Result Date: 07/10/2023 EXAM: CT ABDOMEN AND PELVIS WITH CONTRAST 07/10/2023 07:18:52 PM TECHNIQUE: CT of the abdomen and pelvis was performed with the administration of intravenous contrast. Multiplanar reformatted images are provided for review. Automated exposure control, iterative reconstruction, and/or weight based adjustment of the mA/kV was utilized to reduce the radiation dose to as low as reasonably achievable. COMPARISON: 01/06/2023 CLINICAL HISTORY: Abdominal pain, acute, nonlocalized. Pt bib ems for fever of 102.8 and weakness that started today. Pt normally able to walk to bathroom and has done so earlier today but, is now experiencing weakness since 3 pm. Pt disoriented to time and place. Pt had a fall 3 weeks ago, bruised noted on abdominal area. Given 325 tylenol  pta. FINDINGS: LOWER CHEST: Small hiatal hernia. LIVER: Chronic rim calcified heterogeneous masses along the right hepatic dome and lateral segment left hepatic lobe, measuring 6.8 and 7.2 cm respectively, benign. GALLBLADDER AND BILE DUCTS: Gallbladder is unremarkable. No biliary ductal dilatation. SPLEEN: No acute abnormality. PANCREAS: No acute abnormality. ADRENAL GLANDS: No acute abnormality. KIDNEYS, URETERS AND BLADDER: Bilateral simple renal cysts, measuring up to 7.4 cm in the posterior right upper kidney, benign (Bosniak 1). No follow-up is recommended. Two nonobstructing left lower pole renal calculi  measuring up to 5 mm. No hydronephrosis. No perinephric or periureteral stranding. Urinary bladder decompressed by indwelling foley catheter. GI AND BOWEL: Left colonic diverticulosis, without evidence of diverticulitis. The appendix is not discretely visualized. No bowel obstruction. No bowel wall thickening. PERITONEUM AND RETROPERITONEUM: No ascites. No free air. VASCULATURE: 4.2 cm infrarenal abdominal aortic aneurysm, unchanged. Atherosclerotic calcifications of the abdominal aorta and branch vessels. LYMPH NODES: No lymphadenopathy. REPRODUCTIVE ORGANS: Prostatomegaly, with enlargement of the central gland indenting the base of the bladder, suggesting BPH. BONES AND SOFT TISSUES: Degenerative changes of the visualized thoracolumbar spine. No acute osseous abnormality. No focal soft tissue abnormality. IMPRESSION: 1. No acute findings in the abdomen or pelvis. 2. 4.2 cm infrarenal abdominal aortic aneurysm, unchanged. Given age, annual follow-up is considered optional. 3. Stable hepatic masses, benign. 4. Additional ancillary findings as above. Electronically signed by: Zadie Herter MD 07/10/2023 07:30 PM EDT RP Workstation: [id number]   DG Chest Port 1 View Result Date: 07/10/2023 CLINICAL DATA:  Sepsis.  Fever and weakness. EXAM: PORTABLE CHEST 1 VIEW COMPARISON:  09/01/2022, 08/24/2021. FINDINGS: The heart is enlarged the mediastinal contour is within normal limits. There is atherosclerotic calcification of the aorta. No consolidation, effusion, or pneumothorax is seen. Previously described left upper lobe pulmonary nodule on prior CT is not seen radiographically. No acute osseous abnormality. IMPRESSION: No active disease. Electronically Signed   By: Wyvonnia Heimlich M.D.   On: 07/10/2023 18:18    EKG Vent. rate 95 BPM PR interval * ms QRS duration 79 ms QT/QTcB 356/448 ms P-R-T axes * 41 27 Atrial fibrillation Low voltage, precordial leads  Assessment & Plan:    Principal Problem:   UTI  (urinary tract infection) Active Problems:   GERD (gastroesophageal reflux disease)   Atrial fibrillation (HCC)   Essential hypertension   AAA (abdominal aortic aneurysm) without rupture (HCC)  Hyperlipidemia   COPD (chronic obstructive pulmonary disease) (HCC)   Benign prostatic hyperplasia   Chronic kidney disease, stage 2 (mild)   OSA on CPAP   Class 1 obesity due to excess calories with serious comorbidity and body mass index (BMI) of 33.0 to 33.9 in adult   Impaired ambulation   Unsteady gait when walking  UTI related to Foley catheter - With chronic indwelling Foley - Workup significant for leukocytosis, fever 102.8, UA is positive - Will exchange Foley catheter. - Continue with IV Rocephin  - Follow on urine culture and adjust antibiotics as needed  chronic atrial fibrillation -Continue propanolol for rate control -Continue with warfarin, INR therapeutic, pharmacy to dose  COPD - Able, no wheezing, no dyspnea, continue with as needed nebs   HTN -Will continue with protocol for heart rate control, I will hold amlodipine  and benazepril  for now as blood pressure initially soft, but can resume if blood pressure stable by tomorrow .  HLD - Continue with statin  Ambulatory dysfunction, deconditioning - Will consult PT/OT   BPH Chronic Foley catheter -he is following with urology Dr. Claretta Croft, exchanging his Foley catheter every month, next exchange is due on Monday, will exchange given his UTI  Class 1 Obesity - There is no height or weight on file to calculate BMI.  OSA - Continue with CPAP nightly  AAA - History of AAA, CT scan noted  Dementia -Continue with supportive care   DVT Prophylaxis warfarin  AM Labs Ordered, also please review Full Orders  Family Communication: Admission, patients condition and plan of care including tests being ordered have been discussed with the patient and daughter who indicate understanding and agree with the plan and Code  Status.  Code Status DNR, confirmed by daughter  Likely DC to home  Consults called: None  Admission status: Inpatient  Time spent in minutes : 70 minutes   Seena Dadds M.D on 07/10/2023 at 8:59 PM   [facility] - Office  [phone]

## 2023-07-10 NOTE — ED Provider Notes (Signed)
 [facility] AT [facility] Provider Note   CSN: [id number] Arrival date & time: 07/10/23  1713     Patient presents with: Weakness and Fever   Tristan Lopez is a 89 y.o. male.   Patient history of dementia and has an indwelling Foley.  He also has COPD and atrial fibs.  He started with fever and chills today.  His temperature was 102.8  The history is provided by a relative. No language interpreter was used.  Weakness Severity:  Moderate Onset quality:  Sudden Timing:  Constant Progression:  Worsening Chronicity:  Recurrent Context: not alcohol use   Relieved by:  Nothing Worsened by:  Nothing Ineffective treatments:  None tried Associated symptoms: fever   Associated symptoms: no abdominal pain, no chest pain, no cough, no diarrhea, no frequency, no headaches and no seizures   Fever Associated symptoms: no chest pain, no congestion, no cough, no diarrhea, no headaches and no rash        Prior to Admission medications   Medication Sig Start Date End Date Taking? Authorizing Provider  amLODipine  (NORVASC ) 5 MG tablet TAKE 1 TABLET (5 MG TOTAL) BY MOUTH DAILY. 01/03/23   Melvenia Manus BRAVO, MD  Ascorbic Acid  (VITAMIN C) 1000 MG tablet Take 1,000 mg by mouth daily.    [provider]  benazepril  (LOTENSIN ) 40 MG tablet TAKE 1 TABLET (40 MG TOTAL) BY MOUTH DAILY. 02/22/23   Lopez, Laymon HERO, PA-C  Cholecalciferol  (VITAMIN D -3) 1000 units CAPS Take 2,000 Units by mouth daily.    [provider]  citalopram  (CELEXA ) 20 MG tablet Take 1 tablet (20 mg total) by mouth daily. 05/31/23   Del Orbe Lopez, Iliana, FNP  dimethicone 1 % cream Apply 1 Application topically 2 (two) times daily as needed. 05/31/23   Del Wilhelmena Lloyd Sola, FNP  FEROSUL 325 (65 Fe) MG tablet TAKE ONE TABLET BY MOUTH EVERY DAY 08/03/22   Lopez, Tristan E, MD  furosemide  (LASIX ) 20 MG tablet TAKE ONE TABLET BY MOUTH DAILY AS NEEDED FOR LEG SWELLING 06/10/23    Melvenia Manus BRAVO, MD  Garlic 1000 MG CAPS Take 1,000 mg by mouth daily.    [provider]  mirabegron  ER (MYRBETRIQ ) 25 MG TB24 tablet Take 1 tablet (25 mg total) by mouth daily. 05/15/23   McKenzie, Belvie CROME, MD  pantoprazole  (PROTONIX ) 40 MG tablet TAKE ONE TABLET BY MOUTH EVERY DAY 05/13/23   Lopez, Tristan E, MD  potassium citrate  (UROCIT-K ) 10 MEQ (1080 MG) SR tablet TAKE ONE TABLET BY MOUTH EVERY DAY 06/10/23   Lopez, Tristan E, MD  Probiotic Product (DIGESTIVE ADVANTAGE PO) Take by mouth 2 (two) times daily.     [provider]  propranolol  (INDERAL ) 20 MG tablet TAKE 1 TABLET (20 MG TOTAL) BY MOUTH DAILY. 11/01/22   Debera Jayson MATSU, MD  rosuvastatin  (CRESTOR ) 10 MG tablet TAKE 1 TABLET (10 MG TOTAL) BY MOUTH DAILY. 02/22/23   Lopez, Tristan M, PA-C  tadalafil  (CIALIS ) 5 MG tablet TAKE ONE TABLET BY MOUTH EVERY DAY 06/10/23   Melvenia Manus BRAVO, MD  UNABLE TO FIND 1 each by Does not apply route daily. Med Name: LIFT CHAIR 04/15/23   Melvenia Manus BRAVO, MD  warfarin (COUMADIN ) 5 MG tablet TAKE 1 TABLET (5 MG TOTAL) BY MOUTH DAILY. 04/11/23   Melvenia Manus BRAVO, MD    Allergies: Latex    Review of Systems  Constitutional:  Positive for fever. Negative for appetite change and fatigue.  HENT:  Negative for congestion, ear discharge and sinus pressure.   Eyes:  Negative for discharge.  Respiratory:  Negative for cough.   Cardiovascular:  Negative for chest pain.  Gastrointestinal:  Negative for abdominal pain and diarrhea.  Genitourinary:  Negative for frequency and hematuria.  Musculoskeletal:  Negative for back pain.  Skin:  Negative for rash.  Neurological:  Positive for weakness. Negative for seizures and headaches.  Psychiatric/Behavioral:  Negative for hallucinations.     Updated Vital Signs BP 97/83   Pulse 73   Temp 99.2 F (37.3 C)   Resp 19   SpO2 95%   Physical Exam Vitals and nursing note reviewed.  Constitutional:      Appearance: He is  well-developed.  HENT:     Head: Normocephalic.     Nose: Nose normal.   Eyes:     General: No scleral icterus.    Conjunctiva/sclera: Conjunctivae normal.   Neck:     Thyroid: No thyromegaly.   Cardiovascular:     Rate and Rhythm: Normal rate and regular rhythm.     Heart sounds: No murmur heard.    No friction rub. No gallop.  Pulmonary:     Breath sounds: No stridor. No wheezing or rales.  Chest:     Chest wall: No tenderness.  Abdominal:     General: There is no distension.     Tenderness: There is abdominal tenderness. There is no rebound.   Musculoskeletal:        General: Normal range of motion.     Cervical back: Neck supple.  Lymphadenopathy:     Cervical: No cervical adenopathy.   Skin:    Findings: No erythema or rash.   Neurological:     Mental Status: He is alert.     Motor: No abnormal muscle tone.     Coordination: Coordination normal.     Comments: Oriented to person only    (all labs ordered are listed, but only abnormal results are displayed) Labs Reviewed  COMPREHENSIVE METABOLIC PANEL WITH GFR - Abnormal; Notable for the following components:      Result Value   Sodium 132 (*)    Chloride 96 (*)    Glucose, Bld 142 (*)    Albumin 3.3 (*)    All other components within normal limits  CBC WITH DIFFERENTIAL/PLATELET - Abnormal; Notable for the following components:   WBC 13.4 (*)    RBC 3.88 (*)    Hemoglobin 11.6 (*)    HCT 36.1 (*)    Neutro Abs 11.5 (*)    All other components within normal limits  PROTIME-INR - Abnormal; Notable for the following components:   Prothrombin Time 27.7 (*)    INR 2.6 (*)    All other components within normal limits  URINALYSIS, W/ REFLEX TO CULTURE (INFECTION SUSPECTED) - Abnormal; Notable for the following components:   Color, Urine AMBER (*)    APPearance CLOUDY (*)    Ketones, ur 5 (*)    Protein, ur 100 (*)    Leukocytes,Ua TRACE (*)    Bacteria, UA MANY (*)    Non Squamous Epithelial 0-5 (*)     All other components within normal limits  CULTURE, BLOOD (ROUTINE X 2)  CULTURE, BLOOD (ROUTINE X 2)  RESP PANEL BY RT-PCR (RSV, FLU A&B, COVID)  [id number]  URINE CULTURE  LACTIC ACID, PLASMA    EKG: None  Radiology: CT ABDOMEN PELVIS W CONTRAST Result Date: 07/10/2023 EXAM: CT ABDOMEN AND  PELVIS WITH CONTRAST 07/10/2023 07:18:52 PM TECHNIQUE: CT of the abdomen and pelvis was performed with the administration of intravenous contrast. Multiplanar reformatted images are provided for review. Automated exposure control, iterative reconstruction, and/or weight based adjustment of the mA/kV was utilized to reduce the radiation dose to as low as reasonably achievable. COMPARISON: 01/06/2023 CLINICAL HISTORY: Abdominal pain, acute, nonlocalized. Pt bib ems for fever of 102.8 and weakness that started today. Pt normally able to walk to bathroom and has done so earlier today but, is now experiencing weakness since 3 pm. Pt disoriented to time and place. Pt had a fall 3 weeks ago, bruised noted on abdominal area. Given 325 tylenol  pta. FINDINGS: LOWER CHEST: Small hiatal hernia. LIVER: Chronic rim calcified heterogeneous masses along the right hepatic dome and lateral segment left hepatic lobe, measuring 6.8 and 7.2 cm respectively, benign. GALLBLADDER AND BILE DUCTS: Gallbladder is unremarkable. No biliary ductal dilatation. SPLEEN: No acute abnormality. PANCREAS: No acute abnormality. ADRENAL GLANDS: No acute abnormality. KIDNEYS, URETERS AND BLADDER: Bilateral simple renal cysts, measuring up to 7.4 cm in the posterior right upper kidney, benign (Bosniak 1). No follow-up is recommended. Two nonobstructing left lower pole renal calculi measuring up to 5 mm. No hydronephrosis. No perinephric or periureteral stranding. Urinary bladder decompressed by indwelling foley catheter. GI AND BOWEL: Left colonic diverticulosis, without evidence of diverticulitis. The appendix is not discretely visualized. No bowel  obstruction. No bowel wall thickening. PERITONEUM AND RETROPERITONEUM: No ascites. No free air. VASCULATURE: 4.2 cm infrarenal abdominal aortic aneurysm, unchanged. Atherosclerotic calcifications of the abdominal aorta and branch vessels. LYMPH NODES: No lymphadenopathy. REPRODUCTIVE ORGANS: Prostatomegaly, with enlargement of the central gland indenting the base of the bladder, suggesting BPH. BONES AND SOFT TISSUES: Degenerative changes of the visualized thoracolumbar spine. No acute osseous abnormality. No focal soft tissue abnormality. IMPRESSION: 1. No acute findings in the abdomen or pelvis. 2. 4.2 cm infrarenal abdominal aortic aneurysm, unchanged. Given age, annual follow-up is considered optional. 3. Stable hepatic masses, benign. 4. Additional ancillary findings as above. Electronically signed by: Pinkie Pebbles MD 07/10/2023 07:30 PM EDT RP Workstation: [id number]   DG Chest Port 1 View Result Date: 07/10/2023 CLINICAL DATA:  Sepsis.  Fever and weakness. EXAM: PORTABLE CHEST 1 VIEW COMPARISON:  09/01/2022, 08/24/2021. FINDINGS: The heart is enlarged the mediastinal contour is within normal limits. There is atherosclerotic calcification of the aorta. No consolidation, effusion, or pneumothorax is seen. Previously described left upper lobe pulmonary nodule on prior CT is not seen radiographically. No acute osseous abnormality. IMPRESSION: No active disease. Electronically Signed   By: Leita Birmingham M.D.   On: 07/10/2023 18:18     Procedures   Medications Ordered in the ED  cefTRIAXone  (ROCEPHIN ) 2 g in sodium chloride  0.9 % 100 mL IVPB (0 g Intravenous Stopped 07/10/23 1811)  sodium chloride  0.9 % bolus 1,000 mL (0 mLs Intravenous Stopped 07/10/23 1923)  iohexol  (OMNIPAQUE ) 300 MG/ML solution 100 mL (100 mLs Intravenous Contrast Given 07/10/23 1907)                                    Medical Decision Making Amount and/or Complexity of Data Reviewed Labs: ordered. Radiology:  ordered.  Risk Prescription drug management. Decision regarding hospitalization.   Patient has urinary tract infection.  He will be admitted to medicine and his Foley will be changed.  Patient started on Rocephin      Final diagnoses:  Lower  urinary tract infectious disease    ED Discharge Orders     None          Suzette Pac, MD 07/12/23 1110

## 2023-07-10 NOTE — ED Triage Notes (Signed)
 Pt bib ems for fever of 102.8 and weakness that started today. Pt normally able to walk to bathroom and has done so earlier today but, is now experiencing weakness since 3 pm. Pt disoriented to time and place. Pt had a fall 3weeks ago, bruised noted on abdominal area. Bilateral pitting edema

## 2023-07-10 NOTE — Progress Notes (Signed)
 Elink is following code sepsis.

## 2023-07-10 NOTE — Consult Note (Addendum)
 Pharmacy Consult Note - Anticoagulation  Pharmacy Consult for warfarin Indication: atrial fibrillation  PATIENT MEASUREMENTS:      VITAL SIGNS: Temp: 99.2 F (37.3 C) (06/18 1930) Temp Source: Oral (06/18 1721) BP: 97/83 (06/18 1930) Pulse Rate: 73 (06/18 1930)  Recent Labs    07/10/23 1719  HGB 11.6*  HCT 36.1*  PLT 216  LABPROT 27.7*  INR 2.6*  CREATININE 0.72    Estimated Creatinine Clearance: 71.6 mL/min (by C-G formula based on SCr of 0.72 mg/dL).  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 male with PMH including Afib, CKD, AAA, hx GIB is presenting with UTI. Patient is on chronic anticoagulation with warfarin per chart review. Pharmacy has been consulted to manage warfarin.  Pertinent medications: Per chart, as of 07/09/2023 anticoagulation clinic note, patient's regimen is: Warfarin 2.5mg  PO once daily on Sunday Warfarin 5mg  PO all other days Last dose prior to admission: 6/17   New drug-drug interactions: N/A   Goal(s) of therapy: INR 2 - 3 Monitor platelets by anticoagulation protocol: Yes   Baseline anticoagulation labs: Recent Labs    07/08/23 1116 07/10/23 1719  INR  --  2.6*  HGB 11.8* 11.6*  PLT 242 216    Date INR Warfarin Dose 6/18 2.6 5 mg   PLAN: Administer warfarin 5mg  PO x 1 today in  accordance with home regimen Daily INR CBC at least weekly for inpatients on warfarin   Will M. Alva Jewels, PharmD Clinical Pharmacist 07/10/2023 8:46 PM

## 2023-07-11 DIAGNOSIS — I4891 Unspecified atrial fibrillation: Secondary | ICD-10-CM | POA: Diagnosis not present

## 2023-07-11 DIAGNOSIS — N390 Urinary tract infection, site not specified: Secondary | ICD-10-CM | POA: Diagnosis not present

## 2023-07-11 DIAGNOSIS — T83511A Infection and inflammatory reaction due to indwelling urethral catheter, initial encounter: Secondary | ICD-10-CM | POA: Diagnosis not present

## 2023-07-11 LAB — CBC
HCT: 33.6 % — ABNORMAL LOW (ref 39.0–52.0)
Hemoglobin: 10.6 g/dL — ABNORMAL LOW (ref 13.0–17.0)
MCH: 29.7 pg (ref 26.0–34.0)
MCHC: 31.5 g/dL (ref 30.0–36.0)
MCV: 94.1 fL (ref 80.0–100.0)
Platelets: 165 10*3/uL (ref 150–400)
RBC: 3.57 MIL/uL — ABNORMAL LOW (ref 4.22–5.81)
RDW: 14 % (ref 11.5–15.5)
WBC: 11.7 10*3/uL — ABNORMAL HIGH (ref 4.0–10.5)
nRBC: 0 % (ref 0.0–0.2)

## 2023-07-11 LAB — BASIC METABOLIC PANEL WITH GFR
Anion gap: 7 (ref 5–15)
BUN: 11 mg/dL (ref 8–23)
CO2: 26 mmol/L (ref 22–32)
Calcium: 8.3 mg/dL — ABNORMAL LOW (ref 8.9–10.3)
Chloride: 99 mmol/L (ref 98–111)
Creatinine, Ser: 0.59 mg/dL — ABNORMAL LOW (ref 0.61–1.24)
GFR, Estimated: >60 mL/min (ref >60)
Glucose, Bld: 106 mg/dL — ABNORMAL HIGH (ref 70–99)
Potassium: 3.4 mmol/L — ABNORMAL LOW (ref 3.5–5.1)
Sodium: 132 mmol/L — ABNORMAL LOW (ref 135–145)

## 2023-07-11 LAB — URINE CULTURE

## 2023-07-11 LAB — PROTIME-INR
INR: 2.7 — ABNORMAL HIGH (ref 0.8–1.2)
Prothrombin Time: 29 s — ABNORMAL HIGH (ref 11.4–15.2)

## 2023-07-11 MED ORDER — WARFARIN SODIUM 5 MG PO TABS
5.0000 mg | ORAL_TABLET | Freq: Once | ORAL | Status: AC
  Administered 2023-07-11: 5 mg via ORAL
  Filled 2023-07-11: qty 1

## 2023-07-11 MED ORDER — GERHARDT'S BUTT CREAM
TOPICAL_CREAM | Freq: Three times a day (TID) | CUTANEOUS | Status: DC
  Filled 2023-07-11: qty 60

## 2023-07-11 MED ORDER — POTASSIUM CHLORIDE CRYS ER 20 MEQ PO TBCR
40.0000 meq | EXTENDED_RELEASE_TABLET | Freq: Once | ORAL | Status: AC
  Administered 2023-07-11: 40 meq via ORAL
  Filled 2023-07-11: qty 2

## 2023-07-11 NOTE — Consult Note (Signed)
 WOC Nurse Consult Note: Reason for Consult: DTPI buttocks  Wound type: 1. Moisture Associated Skin Damage to B buttocks  ICD-10 CM Codes for Irritant Dermatitis L24A2 - Due to fecal, urinary or dual incontinence  2.  Deep Tissue Pressure Injury lateral buttock with purple discoloration/dark eschar  Pressure Injury POA: Yes Measurement: see nursing flowsheet  Wound [id number] with dark dusky discoloration with peeling skin and maceration ? Fungal component; lateral edge with area of dark purple discoloration  Drainage (amount, consistency, odor) see nursing flowsheet; appears largely dry  Periwound: erythema  Dressing procedure/placement/frequency:  Cleanse buttocks with soap and water , dry and apply Gerhardt's Butt Cream 3 times daily and prn soiling.    Reposition patient q2 hours.  Reconsult WOC team if further needs arise.  DTPI ARE HIGH RISK TO DETERIORATE.    Thank you,    Ronni Colace MSN, RN-BC, [employer]

## 2023-07-11 NOTE — NC FL2 (Signed)
 [place]  MEDICAID FL2 LEVEL OF CARE FORM     IDENTIFICATION  Patient Name: Tristan Lopez Birthdate: 1930/12/13 Sex: male Admission Date (Current Location): 07/10/2023  [facility] and [place] Number:  [employer] and Address:  [facility],  [address], [name] [address]      Provider Number: [id number]  Attending Physician Name and Address:  Trenton Frock, MD  Relative Name and Phone Number:  Barbra Boone ( [email or web address] ) [phone]    Current Level of Care: Hospital Recommended Level of Care: Skilled Nursing Facility Prior Approval Number:    Date Approved/Denied:   PASRR Number: Pending  Discharge Plan: SNF    Current Diagnoses: Patient Active Problem List   Diagnosis Date Noted   UTI (urinary tract infection) 07/10/2023   Pressure ulcer of left buttock, stage 1 05/31/2023   Anxiety with agitation 10/11/2022   Encounter for well adult exam with abnormal findings 03/27/2022   Unsteady gait when walking 09/28/2021   Need for immunization against influenza 09/28/2021   Anemia 05/23/2021   Chronic left hip pain 05/23/2021   Impaired ambulation 03/13/2021   Rhinorrhea 01/06/2021   Class 1 obesity due to excess calories with serious comorbidity and body mass index (BMI) of 33.0 to 33.9 in adult    Rectal bleeding    ABLA (acute blood loss anemia)    Acute GI bleeding 11/27/2020   COPD (chronic obstructive pulmonary disease) (HCC) 04/21/2019   Benign prostatic hyperplasia 03/21/2018   Chronic kidney disease, stage 2 (mild) 03/21/2018   Hypokalemia 03/21/2018   OSA on CPAP 03/21/2018   Hoarseness 01/17/2017   Hyperlipidemia 11/19/2013   AAA (abdominal aortic aneurysm) without rupture (HCC) 02/17/2013   Encounter for therapeutic drug monitoring 02/12/2013   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/2012   Essential hypertension 05/12/2012   Atrial fibrillation (HCC) 03/13/2012   GERD  (gastroesophageal reflux disease) 10/08/2011   Constipation 10/08/2011   High risk medication use 10/08/2011    Orientation RESPIRATION BLADDER Height & Weight     Self  Normal External catheter Weight:   Height:     BEHAVIORAL SYMPTOMS/MOOD NEUROLOGICAL BOWEL NUTRITION STATUS      Continent Diet (See DC summary)  AMBULATORY STATUS COMMUNICATION OF NEEDS Skin   Extensive Assist Verbally Skin abrasions, Other (Comment) (Wound Type 1 : Moisture associated skin damage Bilateral buttocks)                       Personal Care Assistance Level of Assistance  Bathing, Feeding, Dressing Bathing Assistance: Maximum assistance Feeding assistance: Independent Dressing Assistance: Maximum assistance     Functional Limitations Info  Sight, Hearing, Speech Sight Info: Impaired Hearing Info: Impaired Speech Info: Adequate    SPECIAL CARE FACTORS FREQUENCY  PT (By licensed PT), OT (By licensed OT)     PT Frequency: 5 x a week OT Frequency: 5 x a week            Contractures Contractures Info: Not present    Additional Factors Info  Code Status, Allergies, Psychotropic Code Status Info: DNR-Limited Allergies Info: Latex Psychotropic Info: Citalopram          Current Medications (07/11/2023):  This is the current hospital active medication list Current Facility-Administered Medications  Medication Dose Route Frequency Provider Last Rate Last Admin   ascorbic acid (VITAMIN C) tablet 1,000 mg  1,000 mg Oral Daily Elgergawy, Dawood S, MD   1,000 mg at  07/11/23 0829   bisacodyl  (DULCOLAX) EC tablet 5 mg  5 mg Oral Daily PRN Elgergawy, Dawood S, MD       cefTRIAXone  (ROCEPHIN ) 2 g in sodium chloride  0.9 % 100 mL IVPB  2 g Intravenous Q24H Elgergawy, Dawood S, MD       cholecalciferol (VITAMIN D3) 25 MCG (1000 UNIT) tablet 2,000 Units  2,000 Units Oral Daily Elgergawy, Dawood S, MD   2,000 Units at 07/11/23 4742   citalopram  (CELEXA ) tablet 20 mg  20 mg Oral Daily Elgergawy,  Dawood S, MD   20 mg at 07/10/23 2226   ferrous sulfate  tablet 325 mg  325 mg Oral Q supper Elgergawy, Dawood S, MD       Gerhardt's butt cream   Topical TID Elgergawy, Dawood S, MD   Given at 07/11/23 5956   hydrALAZINE (APRESOLINE) injection 5 mg  5 mg Intravenous Q4H PRN Elgergawy, Dawood S, MD       mirabegron  ER (MYRBETRIQ ) tablet 25 mg  25 mg Oral Daily Elgergawy, Dawood S, MD   25 mg at 07/11/23 3875   pantoprazole  (PROTONIX ) EC tablet 40 mg  40 mg Oral Daily Elgergawy, Dawood S, MD   40 mg at 07/11/23 6433   polyethylene glycol (MIRALAX  / GLYCOLAX ) packet 17 g  17 g Oral Daily PRN Elgergawy, Dawood S, MD       propranolol  (INDERAL ) tablet 20 mg  20 mg Oral Daily Elgergawy, Dawood S, MD   20 mg at 07/11/23 2951   rosuvastatin  (CRESTOR ) tablet 10 mg  10 mg Oral Daily Elgergawy, Dawood S, MD   10 mg at 07/10/23 2226   saccharomyces boulardii (FLORASTOR) capsule 250 mg  250 mg Oral BID Madelynn Schilder, RPH   250 mg at 07/11/23 8841   warfarin (COUMADIN ) tablet 5 mg  5 mg Oral ONCE-1600 Trenton Frock, MD       Warfarin - Pharmacist Dosing Inpatient   Does not apply q1600 Madelynn Schilder, RPH         Discharge Medications: Please see discharge summary for a list of discharge medications.  Relevant Imaging Results:  Relevant Lab Results:   Additional Information SSN: [id number]  Cyndie Dredge, [place]

## 2023-07-11 NOTE — TOC CM/SW Note (Signed)
 To whom it May Concern:  Please be advised that the above name patient will require a short-term nursing home stay- anticipated 30 days or less rehabilitation and strengthening. The plan is for return home.

## 2023-07-11 NOTE — TOC Initial Note (Addendum)
 Transition of Care [facility]) - Initial/Assessment Note    Patient Details  Name: Tristan Lopez MRN: [id number] Date of Birth: Apr 03, 1930  [facility]) CM/SW Contact:    Juanda Noon Phone Number: 07/11/2023, 12:15 PM  Clinical Narrative:                  This Clinical research associate spoke with patient daughter and assessed patient since PT is recommending SNF. Daughter stated that she needed to discuss options with her brothers and would follow back up with Clinical research associate. However, daughter shared that patient live in [place] and has a private paid sitter who helps patient with bathing , dressing, meals, and toileting. Daughter assist with transportation for appointments. Patient sleeps in a [employer]all[employer] and has a rollator, daughter asked about a hospital bed and writer reached out to [employer] to see if patient insurance will cover for bed. TOC will follow up with family about bed once I get a call back from daughter about SNF or HH.   Addendum 3:38 pm   Daughter [name] called back confirming that Clinical research associate can send patient referral out to [employer] and [facility]. Referrals sents via HUB and secure email. TOC to follow.    Barriers to Discharge: Continued Medical Work up   Patient Goals and CMS Choice Patient states their goals for this hospitalization and ongoing recovery are:: Get better CMS Medicare.gov Compare Post Acute Care list provided to:: Patient Represenative (must comment) (Daughter- [name]) Choice offered to / list presented to : Adult Children      Expected Discharge Plan and Services In-house Referral: Clinical Social Work   Post Acute Care Choice: Durable Medical Equipment Living arrangements for the past 2 months: Single Family Home                                      Prior Living Arrangements/Services Living arrangements for the past 2 months: Single Family Home Lives with:: Self, Other (Comment) Runner, broadcasting/film/video) Patient language and need for  interpreter reviewed:: Yes Do you feel safe going back to the place where you live?: No   Sitter cannot handle patient like this  Need for Family Participation in Patient Care: Yes (Comment) Care giver support system in place?: Yes (comment) Current home services: DME, Sitter Criminal Activity/Legal Involvement Pertinent to Current Situation/Hospitalization: No - Comment as needed  Activities of Daily Living   ADL Screening (condition at time of admission) Independently performs ADLs?: No Does the patient have a NEW difficulty with bathing/dressing/toileting/self-feeding that is expected to last >3 days?: No Does the patient have a NEW difficulty with getting in/out of bed, walking, or climbing stairs that is expected to last >3 days?: No Does the patient have a NEW difficulty with communication that is expected to last >3 days?: No Is the patient deaf or have difficulty hearing?: Yes Does the patient have difficulty seeing, even when wearing glasses/contacts?: No Does the patient have difficulty concentrating, remembering, or making decisions?: Yes  Permission Sought/Granted      Share Information with NAME: [name]     Permission granted to share info w Relationship: Daughter     Emotional Assessment Appearance:: Appears stated age     Orientation: : Oriented to Self Alcohol / Substance Use: Not Applicable Psych Involvement: No (comment)  Admission diagnosis:  Lower urinary tract infectious disease [N39.0] UTI (urinary tract infection) [N39.0] Patient  Active Problem List   Diagnosis Date Noted   UTI (urinary tract infection) 07/10/2023   Pressure ulcer of left buttock, stage 1 05/31/2023   Anxiety with agitation 10/11/2022   Encounter for well adult exam with abnormal findings 03/27/2022   Unsteady gait when walking 09/28/2021   Need for immunization against influenza 09/28/2021   Anemia 05/23/2021   Chronic left hip pain 05/23/2021   Impaired ambulation 03/13/2021    Rhinorrhea 01/06/2021   Class 1 obesity due to excess calories with serious comorbidity and body mass index (BMI) of 33.0 to 33.9 in adult    Rectal bleeding    ABLA (acute blood loss anemia)    Acute GI bleeding 11/27/2020   COPD (chronic obstructive pulmonary disease) (HCC) 04/21/2019   Benign prostatic hyperplasia 03/21/2018   Chronic kidney disease, stage 2 (mild) 03/21/2018   Hypokalemia 03/21/2018   OSA on CPAP 03/21/2018   Hoarseness 01/17/2017   Hyperlipidemia 11/19/2013   AAA (abdominal aortic aneurysm) without rupture (HCC) 02/17/2013   Encounter for therapeutic drug monitoring 02/12/2013   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/2012   Essential hypertension 05/12/2012   Atrial fibrillation (HCC) 03/13/2012   GERD (gastroesophageal reflux disease) 10/08/2011   Constipation 10/08/2011   High risk medication use 10/08/2011   PCP:  Alison Irvine, FNP Pharmacy:   [facility] - [place], [place] - [address] [place] [address] Phone: [phone] Fax: [phone]  [facility] [address] [place], [place] - [address] [place] [address] Phone: [phone] Fax: [phone]     Social Drivers of Health (SDOH) Social History: SDOH Screenings   Food Insecurity: Patient Unable To Answer (07/11/2023)  Housing: Patient Unable To Answer (07/11/2023)  Transportation Needs: Patient Unable To Answer (07/11/2023)  Utilities: Not At Risk (02/12/2023)   Received from [facility]  Alcohol Screen: Low Risk  (03/27/2022)  Depression (PHQ2-9): High Risk (05/31/2023)  Financial Resource Strain: Low Risk  (04/10/2023)  Physical Activity: Inactive (04/10/2023)  Social Connections: Unknown (07/11/2023)  Stress: No Stress Concern Present (04/10/2023)  Tobacco Use: Medium Risk (07/10/2023)   SDOH Interventions:     Readmission Risk Interventions    07/11/2023   11:51 AM   Readmission Risk Prevention Plan  Transportation Screening Complete  Home Care Screening Complete  Medication Review (RN CM) Complete

## 2023-07-11 NOTE — Progress Notes (Signed)
*  Patient arrived to the floor last night. He requires max assist by two people to the bedside commode. He is oriented to self. He does realize his daughter is in the room. Daughter states that he fell a couple weeks ago and he has a huge bruise to his abdomen which also feels hard and swollen.   *Deep tissue injury noted to buttocks and wound ostomy nurse has already been consulted.   *New foley was placed upon arrival. Patient's skin is red and irritated in the peri area. 16 fr foley was placed with one attempt. Sediment is noted in the urine.   *Patient did have two episodes of passing flatulence with clear watery contents expelled. I asked daughter had he had an enema and daughter denied him having one. We did clean him upon arrival of a bowel movement but it was brown in color.

## 2023-07-11 NOTE — Plan of Care (Signed)
  Problem: Acute Rehab PT Goals(only PT should resolve) Goal: Pt Will Go Supine/Side To Sit Outcome: Progressing Flowsheets (Taken 07/11/2023 1206) Pt will go Supine/Side to Sit:  with contact guard assist  with supervision Goal: Patient Will Transfer Sit To/From Stand Outcome: Progressing Flowsheets (Taken 07/11/2023 1206) Patient will transfer sit to/from stand:  with supervision  with modified independence Goal: Pt Will Transfer Bed To Chair/Chair To Bed Outcome: Progressing Flowsheets (Taken 07/11/2023 1206) Pt will Transfer Bed to Chair/Chair to Bed:  with supervision  with modified independence Goal: Pt Will Ambulate Outcome: Progressing Flowsheets (Taken 07/11/2023 1206) Pt will Ambulate:  50 feet  with supervision  with contact guard assist  with rolling walker   12:07 PM, 07/11/23 Walton Guppy, MPT Physical Therapist with [facility] [phone] office [phone] mobile phone

## 2023-07-11 NOTE — Consult Note (Signed)
 Pharmacy Consult Note - Anticoagulation  Pharmacy Consult for warfarin Indication: atrial fibrillation  PATIENT MEASUREMENTS:      VITAL SIGNS: Temp: 98.3 F (36.8 C) (06/19 0502) Temp Source: Oral (06/19 0502) BP: 116/61 (06/19 0502) Pulse Rate: 81 (06/19 0502)  Recent Labs    07/11/23 0436  HGB 10.6*  HCT 33.6*  PLT 165  LABPROT 29.0*  INR 2.7*  CREATININE 0.59*    Estimated Creatinine Clearance: 71.6 mL/min (A) (by C-G formula based on SCr of 0.59 mg/dL (L)).  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 male with PMH including Afib, CKD, AAA, hx GIB is presenting with UTI. Patient is on chronic anticoagulation with warfarin per chart review. Pharmacy has been consulted to manage warfarin.  Pertinent medications: Per chart, as of 07/09/2023 anticoagulation clinic note, patient's regimen is: Warfarin 2.5mg  PO once daily on Sunday Warfarin 5mg  PO all other days Last dose prior to admission: 6/17    Goal(s) of therapy: INR 2 - 3 Monitor platelets by anticoagulation protocol: Yes   Baseline anticoagulation labs: Recent Labs    07/08/23 1116 07/10/23 1719 07/11/23 0436  INR  --  2.6* 2.7*  HGB 11.8* 11.6* 10.6*  PLT 242 216 165    Date INR Warfarin Dose 6/18 2.6 5 mg  6/19     2.7  PLAN: Administer warfarin 5mg   PO x 1 today in accordance with home regimen Daily INR CBC at least weekly for inpatients on warfarin  Cliffton Dama, PharmD Clinical Pharmacist 07/11/2023 8:42 AM

## 2023-07-11 NOTE — Plan of Care (Signed)
  Problem: Acute Rehab OT Goals (only OT should resolve) Goal: Pt. Will Perform Grooming Flowsheets (Taken 07/11/2023 0954) Pt Will Perform Grooming:  standing  with supervision Goal: Pt. Will Perform Lower Body Dressing Flowsheets (Taken 07/11/2023 0954) Pt Will Perform Lower Body Dressing:  with min assist  sitting/lateral leans Goal: Pt. Will Transfer To Toilet Flowsheets (Taken 07/11/2023 0954) Pt Will Transfer to Toilet:  with supervision  ambulating Goal: Pt. Will Perform Toileting-Clothing Manipulation Flowsheets (Taken 07/11/2023 0954) Pt Will Perform Toileting - Clothing Manipulation and hygiene:  with min assist  sitting/lateral leans Goal: Pt/Caregiver Will Perform Home Exercise Program Flowsheets (Taken 07/11/2023 [phone]) Pt/caregiver will Perform Home Exercise Program:  Increased strength  Both right and left upper extremity  Independently  Jerrica Thorman OT, MOT

## 2023-07-11 NOTE — Evaluation (Signed)
 Physical Therapy Evaluation Patient Details Name: Tristan Lopez MRN: [id number] DOB: 01/24/1930 Today's Date: 07/11/2023  History of Present Illness  Lonnel Gjerde  is a 89 y.o. male, with past medical history relevant for HTN, history of pancolonic diverticulosis, history of reflux esophagitis and gastric ulcer, obesity and OSA, BPH with LUTS requiring chronic indwelling Foley, COPD, chronic atrial fibrillation on chronic anticoagulation with Coumadin  .  - Patient was brought to ED by EMS, daughter at bedside, assists with the history, he is living home by himself, but he is having 24-hour care, patient has been with progressive weakness, unsteady gait, has been more confused his baseline, has poor appetite, so EMS were called, when they were there noted him to have fever 102.8 so he was brought to ED for further evaluation, he is with known history of chronic Foley catheter, followed by Dr. Claretta Croft, exchanged every 30 days, next exchange is on Monday.  - In ED he had fever 99.9, initially blood pressure soft but responded to fluids, he has been strongly positive, CT abdomen pelvis significant for known AAA, unchanged, stable benign hepatic masses, patient was started on IV antibiotics and [facility] hospitalist consulted to admit   Clinical Impression  Patient demonstrates slightly labored movement for sitting up at bedside, transferring to chair, unsteady on feet with taking steps requiring wide base of support using RW, able to transfer to/from commode in bathroom, but limited for ambulation in room due to fatigue and weakness. Patient tolerated sitting up in chair after therapy. Patient will benefit from continued skilled physical therapy in hospital and recommended venue below to increase strength, balance, endurance for safe ADLs and gait.          If plan is discharge home, recommend the following: A lot of help with bathing/dressing/bathroom;A lot of help with walking and/or transfers;Help  with stairs or ramp for entrance;Assistance with cooking/housework   Can travel by private vehicle   No    Equipment Recommendations None recommended by PT  Recommendations for Other Services       Functional Status Assessment Patient has had a recent decline in their functional status and demonstrates the ability to make significant improvements in function in a reasonable and predictable amount of time.     Precautions / Restrictions Precautions Precautions: Fall Recall of Precautions/Restrictions: Impaired Restrictions Weight Bearing Restrictions Per Provider Order: No      Mobility  Bed Mobility Overal bed mobility: Needs Assistance Bed Mobility: Supine to Sit     Supine to sit: Min assist, HOB elevated     General bed mobility comments: labored movement, increased time    Transfers Overall transfer level: Needs assistance Equipment used: Rolling walker (2 wheels) Transfers: Sit to/from Stand, Bed to chair/wheelchair/BSC Sit to Stand: Min assist   Step pivot transfers: Contact guard assist, Min assist       General transfer comment: labored movement; unsteady; verbal cuing needed    Ambulation/Gait Ambulation/Gait assistance: Min assist, Mod assist Gait Distance (Feet): 15 Feet Assistive device: Rolling walker (2 wheels) Gait Pattern/deviations: Decreased step length - right, Decreased step length - left, Decreased stride length, Trunk flexed, Wide base of support Gait velocity: decreased     General Gait Details: slow labored unsteady movement requiring frequent verbal/tactile cueing for safety, able to ambulate to bathroom and limited mostly due to fatigue and generalized weakness  Stairs            Wheelchair Mobility     Tilt Bed  Modified Rankin (Stroke Patients Only)       Balance Overall balance assessment: Needs assistance Sitting-balance support: Feet supported, No upper extremity supported Sitting balance-Leahy Scale:  Fair Sitting balance - Comments: fair to good at EOB   Standing balance support: Bilateral upper extremity supported, During functional activity, Reliant on assistive device for balance Standing balance-Leahy Scale: Fair Standing balance comment: using RW                             Pertinent Vitals/Pain Pain Assessment Pain Assessment: No/denies pain    Home Living Family/patient expects to be discharged to:: Private residence Living Arrangements: Alone Available Help at Discharge: Personal care attendant;Available 24 hours/day Type of Home: House Home Access: Level entry     Alternate Level Stairs-Number of Steps: 12 Home Layout: Two level;Able to live on main level with bedroom/bathroom Home Equipment: Rollator (4 wheels);BSC/3in1;Cane - single point;Lift chair;Shower seat      Prior Function Prior Level of Function : Needs assist       Physical Assist : Mobility (physical);ADLs (physical) Mobility (physical): Transfers;Gait;Bed mobility;Stairs ADLs (physical): Feeding;Grooming;Bathing;Toileting;Dressing;IADLs Mobility Comments: Supervision assist using rollator in the house. ADLs Comments: Assist all ADL's. Set up assist for feeding and grooming. Care providers 24/7.     Extremity/Trunk Assessment   Upper Extremity Assessment Upper Extremity Assessment: Defer to OT evaluation    Lower Extremity Assessment Lower Extremity Assessment: Generalized weakness    Cervical / Trunk Assessment Cervical / Trunk Assessment: Kyphotic  Communication   Communication Communication: Impaired Factors Affecting Communication: Hearing impaired    Cognition Arousal: Alert Behavior During Therapy: WFL for tasks assessed/performed   PT - Cognitive impairments: History of cognitive impairments                         Following commands: Intact       Cueing Cueing Techniques: Verbal cues, Tactile cues     General Comments      Exercises      Assessment/Plan    PT Assessment Patient needs continued PT services  PT Problem List Decreased strength;Decreased activity tolerance;Decreased balance;Decreased mobility       PT Treatment Interventions DME instruction;Gait training;Stair training;Functional mobility training;Therapeutic activities;Therapeutic exercise;Balance training;Patient/family education    PT Goals (Current goals can be found in the Care Plan section)  Acute Rehab PT Goals Patient Stated Goal: return home PT Goal Formulation: With patient/family Time For Goal Achievement: 07/25/23 Potential to Achieve Goals: Good    Frequency Min 3X/week     Co-evaluation PT/OT/SLP Co-Evaluation/Treatment: Yes Reason for Co-Treatment: To address functional/ADL transfers PT goals addressed during session: Mobility/safety with mobility;Balance;Proper use of DME OT goals addressed during session: ADL's and self-care       AM-PAC PT 6 Clicks Mobility  Outcome Measure Help needed turning from your back to your side while in a flat bed without using bedrails?: A Little Help needed moving from lying on your back to sitting on the side of a flat bed without using bedrails?: A Little Help needed moving to and from a bed to a chair (including a wheelchair)?: A Little Help needed standing up from a chair using your arms (e.g., wheelchair or bedside chair)?: A Little Help needed to walk in hospital room?: A Lot Help needed climbing 3-5 steps with a railing? : A Lot 6 Click Score: 16    End of Session   Activity Tolerance: Patient tolerated  treatment well Patient left: in chair;with call bell/phone within reach;with chair alarm set;with family/visitor present Nurse Communication: Mobility status PT Visit Diagnosis: Unsteadiness on feet (R26.81);Other abnormalities of gait and mobility (R26.89);Muscle weakness (generalized) (M62.81)    Time: 9147-8295 PT Time Calculation (min) (ACUTE ONLY): 23 min   Charges:   PT  Evaluation $PT Eval Moderate Complexity: 1 Mod PT Treatments $Therapeutic Activity: 23-37 mins PT General Charges $$ ACUTE PT VISIT: 1 Visit         12:05 PM, 07/11/23 Walton Guppy, MPT Physical Therapist with [facility] 336 [phone] office [phone] mobile phone

## 2023-07-11 NOTE — Plan of Care (Signed)

## 2023-07-11 NOTE — Plan of Care (Signed)

## 2023-07-11 NOTE — Progress Notes (Signed)
 PROGRESS NOTE  Tristan Lopez  ZOX:[id number] DOB: [birth year] DOA: 07/10/2023 PCP: Alison Irvine, FNP  Consultants  Brief Narrative: 89 y.o. male with a PMH significant forHTN, history of pancolonic diverticulosis, history of reflux esophagitis and gastric ulcer, obesity and OSA, BPH with LUTS requiring chronic indwelling Foley, COPD, chronic atrial fibrillation on chronic anticoagulation with Coumadin  . - Patient was brought to ED by EMS, daughter at bedside, assists with the history, he is living home by himself, but he is having 24-hour care, patient has been with progressive weakness, unsteady gait, has been more confused his baseline, has poor appetite, so EMS were called, when they were there noted him to have fever 102.8 so he was brought to ED for further evaluation, he is with known history of chronic Foley catheter, followed by Dr. Claretta Croft, exchanged every 30 days, next exchange is on Monday. - In ED he had fever 99.9, initially blood pressure soft but responded to fluids, he has been strongly positive, CT abdomen pelvis significant for known AAA, unchanged, stable benign hepatic masses, patient was started on IV antibiotics and [facility] hospitalist consulted to admit   Assessment & Plan: Catheter associated UTI: - With chronic indwelling Foley, exchanged in the emergency department. - Workup significant for leukocytosis, fever 102.8, UA is positive--> improved after initiation of antibiotics and transferred to hospital floor - Continue with IV Rocephin  - Follow on urine culture and adjust antibiotics as needed  chronic atrial fibrillation -Continue propanolol for rate control -Continue with warfarin, INR therapeutic, pharmacy to dose  Hyponatremia: - Mild - Likely hypotonic though also noted in past. - SSRI increases risk of hyponatremia at his age, if worsens would hold and defer to outpatient  Hypokalemia: - Replaced today  Leukocytosis: - Secondary to 1 above. -  Downtrending, continue antibiotics  Normocytic anemia: -Chronic, at baseline, will trend.  No evidence of bleeding   COPD - Able, no wheezing, no dyspnea, continue with as needed nebs    HTN -Will continue with protocol for heart rate control,  - Amlodipine  and benazepril  held on admit due to blood pressures 90s to 100 systolic.   - Can resume if blood pressure continues to rise, will monitor   HLD - Continue with statin   Ambulatory dysfunction, deconditioning - Will consult PT/OT   BPH Chronic Foley catheter -he is following with urology Dr. Claretta Croft, exchanging his Foley catheter every month, next exchange is due on Monday, will exchange given his UTI   Class 1 Obesity - There is no height or weight on file to calculate BMI.   OSA - Continue with CPAP nightly   AAA - History of AAA, CT scan noted   Dementia -Continue with supportive care -Daughter very supportive and at bedside   DVT prophylaxis:   warfarin (COUMADIN ) tablet 5 mg  Code Status:   Code Status: Limited: Do not attempt resuscitation (DNR) -DNR-LIMITED -Do Not Intubate/DNI  Family Communication: Daughter at bedside, all questions answered Level of care: Med-Surg Status is: Inpatient Dispo: Pending treatment and urine culture for catheter associated UTI   Consults called: None  Subjective: Patient awake and alert on my exam this morning.  Pleasantly demented.  Eating breakfast  Objective: Vitals:   07/10/23 1930 07/10/23 2053 07/11/23 0502 07/11/23 1328  BP: 97/83 139/74 116/61 128/70  Pulse: 73 83 81 71  Resp: 19 18 18 19   Temp: 99.2 F (37.3 C) 99 F (37.2 C) 98.3 F (36.8 C) 99.8 F (37.7 C)  TempSrc:  Oral  Oral Oral  SpO2: 95% 100% 96% 98%    Intake/Output Summary (Last 24 hours) at 07/11/2023 1601 Last data filed at 07/11/2023 0809 Gross per 24 hour  Intake 240 ml  Output 550 ml  Net -310 ml   There were no vitals filed for this visit. There is no height or weight on file to  calculate BMI.  Gen: 89 y.o. male in no apparent distress.  Nontoxic Pulm: Non-labored breathing.  Clear to auscultation bilaterally.  CV: Regular rate and rhythm. No murmur, rub, or gallop. No JVD GI: Abdomen soft, non-tender, non-distended, with normoactive bowel sounds. No organomegaly or masses felt. GU: Foley in place Ext: Warm, no deformities, +1 bilateral lower extremity edema Skin: No rashes, lesions  Neuro: Alert, oriented only to person.  Moving bilateral upper lower extremities symmetrically and well Psych: Calm  Judgement and insight appear normal. Mood & affect appropriate.     I have personally reviewed the following labs and images: CBC: Recent Labs  Lab 07/08/23 1116 07/10/23 1719 07/11/23 0436  WBC 5.4 13.4* 11.7*  NEUTROABS 2.9 11.5*  --   HGB 11.8* 11.6* 10.6*  HCT 36.8* 36.1* 33.6*  MCV 94 93.0 94.1  PLT 242 216 165   BMP &GFR Recent Labs  Lab 07/08/23 1116 07/10/23 1719 07/11/23 0436  NA 136 132* 132*  K 4.8 3.7 3.4*  CL 96 96* 99  CO2 23 25 26   GLUCOSE 89 142* 106*  BUN 12 10 11   CREATININE 0.73* 0.72 0.59*  CALCIUM  9.3 8.9 8.3*   Estimated Creatinine Clearance: 71.6 mL/min (A) (by C-G formula based on SCr of 0.59 mg/dL (L)). Liver & Pancreas: Recent Labs  Lab 07/10/23 1719  AST 20  ALT 15  ALKPHOS 71  BILITOT 1.0  PROT 7.0  ALBUMIN 3.3*   No results for input(s): LIPASE, AMYLASE in the last 168 hours. No results for input(s): AMMONIA in the last 168 hours. Diabetic: No results for input(s): HGBA1C in the last 72 hours. No results for input(s): GLUCAP in the last 168 hours. Cardiac Enzymes: No results for input(s): CKTOTAL, CKMB, CKMBINDEX, TROPONINI in the last 168 hours. No results for input(s): PROBNP in the last 8760 hours. Coagulation Profile: Recent Labs  Lab 07/10/23 1719 07/11/23 0436  INR 2.6* 2.7*   Thyroid Function Tests: No results for input(s): TSH, T4TOTAL, FREET4, T3FREE, THYROIDAB  in the last 72 hours. Lipid Profile: No results for input(s): CHOL, HDL, LDLCALC, TRIG, CHOLHDL, LDLDIRECT in the last 72 hours. Anemia Panel: No results for input(s): VITAMINB12, FOLATE, FERRITIN, TIBC, IRON , RETICCTPCT in the last 72 hours. Urine analysis:    Component Value Date/Time   COLORURINE AMBER (A) 07/10/2023 1825   APPEARANCEUR CLOUDY (A) 07/10/2023 1825   APPEARANCEUR Cloudy (A) 05/10/2021 1001   LABSPEC 1.023 07/10/2023 1825   PHURINE 7.0 07/10/2023 1825   GLUCOSEU NEGATIVE 07/10/2023 1825   HGBUR NEGATIVE 07/10/2023 1825   BILIRUBINUR NEGATIVE 07/10/2023 1825   BILIRUBINUR Negative 05/10/2021 1001   KETONESUR 5 (A) 07/10/2023 1825   PROTEINUR 100 (A) 07/10/2023 1825   UROBILINOGEN 0.2 10/04/2011 2218   NITRITE NEGATIVE 07/10/2023 1825   LEUKOCYTESUR TRACE (A) 07/10/2023 1825   Sepsis Labs: Invalid input(s): PROCALCITONIN, LACTICIDVEN  Microbiology: Recent Results (from the past 240 hours)  Culture, blood (Routine x 2)     Status: None (Preliminary result)   Collection Time: 07/10/23  5:19 PM   Specimen: BLOOD  Result Value Ref Range Status   Specimen Description BLOOD BLOOD LEFT  WRIST  Final   Special Requests   Final    BOTTLES DRAWN AEROBIC AND ANAEROBIC Blood Culture results may not be optimal due to an inadequate volume of blood received in culture bottles   Culture   Final    NO GROWTH < 24 HOURS Performed at [facility], [address], [place], [place] [address]    Report Status PENDING  Incomplete  Culture, blood (Routine x 2)     Status: None (Preliminary result)   Collection Time: 07/10/23  5:20 PM   Specimen: BLOOD  Result Value Ref Range Status   Specimen Description BLOOD RIGHT ANTECUBITAL  Final   Special Requests   Final    BOTTLES DRAWN AEROBIC AND ANAEROBIC Blood Culture adequate volume   Culture   Final    NO GROWTH < 24 HOURS Performed at [facility], [address], [place], [place] [address]    Report  Status PENDING  Incomplete  Resp panel by RT-PCR (RSV, Flu A&B, Covid) Anterior Nasal Swab     Status: None   Collection Time: 07/10/23  6:20 PM   Specimen: Anterior Nasal Swab  Result Value Ref Range Status   SARS Coronavirus 2 by RT PCR NEGATIVE NEGATIVE Final    Comment: (NOTE) SARS-CoV-2 target nucleic acids are NOT DETECTED.  The SARS-CoV-2 RNA is generally detectable in upper respiratory specimens during the acute phase of infection. The lowest concentration of SARS-CoV-2 viral copies this assay can detect is 138 copies/mL. A negative result does not preclude SARS-Cov-2 infection and should not be used as the sole basis for treatment or other patient management decisions. A negative result may occur with  improper specimen collection/handling, submission of specimen other than nasopharyngeal swab, presence of viral mutation(s) within the areas targeted by this assay, and inadequate number of viral copies(<138 copies/mL). A negative result must be combined with clinical observations, patient history, and epidemiological information. The expected result is Negative.  Fact Sheet for Patients:  BloggerCourse.com  Fact Sheet for Healthcare Providers:  SeriousBroker.it  This test is no t yet approved or cleared by the [place]  FDA and  has been authorized for detection and/or diagnosis of SARS-CoV-2 by FDA under an Emergency Use Authorization (EUA). This EUA will remain  in effect (meaning this test can be used) for the duration of the COVID-19 declaration under Section 564(b)(1) of the Act, 21 U.S.C.section 360bbb-3(b)(1), unless the authorization is terminated  or revoked sooner.       Influenza A by PCR NEGATIVE NEGATIVE Final   Influenza B by PCR NEGATIVE NEGATIVE Final    Comment: (NOTE) The Xpert Xpress SARS-CoV-2/FLU/RSV plus assay is intended as an aid in the diagnosis of influenza from Nasopharyngeal swab  specimens and should not be used as a sole basis for treatment. Nasal washings and aspirates are unacceptable for Xpert Xpress SARS-CoV-2/FLU/RSV testing.  Fact Sheet for Patients: BloggerCourse.com  Fact Sheet for Healthcare Providers: SeriousBroker.it  This test is not yet approved or cleared by the [place]  FDA and has been authorized for detection and/or diagnosis of SARS-CoV-2 by FDA under an Emergency Use Authorization (EUA). This EUA will remain in effect (meaning this test can be used) for the duration of the COVID-19 declaration under Section 564(b)(1) of the Act, 21 U.S.C. section 360bbb-3(b)(1), unless the authorization is terminated or revoked.     Resp Syncytial Virus by PCR NEGATIVE NEGATIVE Final    Comment: (NOTE) Fact Sheet for Patients: BloggerCourse.com  Fact Sheet for Healthcare Providers: SeriousBroker.it  This test is not yet approved or cleared by the [place]  FDA and has been authorized for detection and/or diagnosis of SARS-CoV-2 by FDA under an Emergency Use Authorization (EUA). This EUA will remain in effect (meaning this test can be used) for the duration of the COVID-19 declaration under Section 564(b)(1) of the Act, 21 U.S.C. section 360bbb-3(b)(1), unless the authorization is terminated or revoked.  Performed at [facility], [address], [place], [place] [address]     Radiology Studies: CT ABDOMEN PELVIS W CONTRAST Result Date: 07/10/2023 EXAM: CT ABDOMEN AND PELVIS WITH CONTRAST 07/10/2023 07:18:52 PM TECHNIQUE: CT of the abdomen and pelvis was performed with the administration of intravenous contrast. Multiplanar reformatted images are provided for review. Automated exposure control, iterative reconstruction, and/or weight based adjustment of the mA/kV was utilized to reduce the radiation dose to as low as reasonably achievable.  COMPARISON: 01/06/2023 CLINICAL HISTORY: Abdominal pain, acute, nonlocalized. Pt bib ems for fever of 102.8 and weakness that started today. Pt normally able to walk to bathroom and has done so earlier today but, is now experiencing weakness since 3 pm. Pt disoriented to time and place. Pt had a fall 3 weeks ago, bruised noted on abdominal area. Given 325 tylenol  pta. FINDINGS: LOWER CHEST: Small hiatal hernia. LIVER: Chronic rim calcified heterogeneous masses along the right hepatic dome and lateral segment left hepatic lobe, measuring 6.8 and 7.2 cm respectively, benign. GALLBLADDER AND BILE DUCTS: Gallbladder is unremarkable. No biliary ductal dilatation. SPLEEN: No acute abnormality. PANCREAS: No acute abnormality. ADRENAL GLANDS: No acute abnormality. KIDNEYS, URETERS AND BLADDER: Bilateral simple renal cysts, measuring up to 7.4 cm in the posterior right upper kidney, benign (Bosniak 1). No follow-up is recommended. Two nonobstructing left lower pole renal calculi measuring up to 5 mm. No hydronephrosis. No perinephric or periureteral stranding. Urinary bladder decompressed by indwelling foley catheter. GI AND BOWEL: Left colonic diverticulosis, without evidence of diverticulitis. The appendix is not discretely visualized. No bowel obstruction. No bowel wall thickening. PERITONEUM AND RETROPERITONEUM: No ascites. No free air. VASCULATURE: 4.2 cm infrarenal abdominal aortic aneurysm, unchanged. Atherosclerotic calcifications of the abdominal aorta and branch vessels. LYMPH NODES: No lymphadenopathy. REPRODUCTIVE ORGANS: Prostatomegaly, with enlargement of the central gland indenting the base of the bladder, suggesting BPH. BONES AND SOFT TISSUES: Degenerative changes of the visualized thoracolumbar spine. No acute osseous abnormality. No focal soft tissue abnormality. IMPRESSION: 1. No acute findings in the abdomen or pelvis. 2. 4.2 cm infrarenal abdominal aortic aneurysm, unchanged. Given age, annual follow-up  is considered optional. 3. Stable hepatic masses, benign. 4. Additional ancillary findings as above. Electronically signed by: Zadie Herter MD 07/10/2023 07:30 PM EDT RP Workstation: [id number]   DG Chest Port 1 View Result Date: 07/10/2023 CLINICAL DATA:  Sepsis.  Fever and weakness. EXAM: PORTABLE CHEST 1 VIEW COMPARISON:  09/01/2022, 08/24/2021. FINDINGS: The heart is enlarged the mediastinal contour is within normal limits. There is atherosclerotic calcification of the aorta. No consolidation, effusion, or pneumothorax is seen. Previously described left upper lobe pulmonary nodule on prior CT is not seen radiographically. No acute osseous abnormality. IMPRESSION: No active disease. Electronically Signed   By: Wyvonnia Heimlich M.D.   On: 07/10/2023 18:18    Scheduled Meds:  vitamin C  1,000 mg Oral Daily   cholecalciferol  2,000 Units Oral Daily   citalopram   20 mg Oral Daily   ferrous sulfate   325 mg Oral Q supper   Gerhardt's butt cream   Topical TID   mirabegron  ER  25 mg Oral Daily   pantoprazole   40 mg Oral Daily   propranolol   20 mg Oral Daily   rosuvastatin   10 mg Oral Daily   saccharomyces boulardii  250 mg Oral BID   warfarin  5 mg Oral ONCE-1600   Warfarin - Pharmacist Dosing Inpatient   Does not apply q1600   Continuous Infusions:  cefTRIAXone  (ROCEPHIN )  IV       LOS: 1 day   35 minutes with more than 50% spent in reviewing records, counseling patient/family and coordinating care.  Trenton Frock, MD [facility] [email or web address] 07/11/2023, 4:01 PM

## 2023-07-11 NOTE — Evaluation (Signed)
 Occupational Therapy Evaluation Patient Details Name: Tristan Lopez MRN: [id number] DOB: July 29, 1930 Today's Date: 07/11/2023   History of Present Illness   Tristan Lopez  is a 89 y.o. male, with past medical history relevant for HTN, history of pancolonic diverticulosis, history of reflux esophagitis and gastric ulcer, obesity and OSA, BPH with LUTS requiring chronic indwelling Foley, COPD, chronic atrial fibrillation on chronic anticoagulation with Coumadin  .  - Patient was brought to ED by EMS, daughter at bedside, assists with the history, he is living home by himself, but he is having 24-hour care, patient has been with progressive weakness, unsteady gait, has been more confused his baseline, has poor appetite, so EMS were called, when they were there noted him to have fever 102.8 so he was brought to ED for further evaluation, he is with known history of chronic Foley catheter, followed by Dr. Claretta Croft, exchanged every 30 days, next exchange is on Monday.  - In ED he had fever 99.9, initially blood pressure soft but responded to fluids, he has been strongly positive, CT abdomen pelvis significant for known AAA, unchanged, stable benign hepatic masses, patient was started on IV antibiotics and [facility] hospitalist consulted to admit (per MD)     Clinical Impressions Pt agreeable to OT and PT co-evaluation. Pt reportedly doing much better than yesterday. CGA to min A for transfers today. Max A for per-care after a bowel movement. General weakness of B UE with more pronounced weakness for R shoulder. Assisted for all ADL's at baseline. Pt has 24/7 support at home. Pt left in the chair with chair alarm set and family present. Pt will benefit from continued OT in the hospital and recommended venue below to increase strength, balance, and endurance for safe ADL's.        If plan is discharge home, recommend the following:   A little help with walking and/or transfers;A lot of help with  bathing/dressing/bathroom;Assistance with cooking/housework;Assist for transportation;Help with stairs or ramp for entrance;Direct supervision/assist for medications management     Functional Status Assessment   Patient has had a recent decline in their functional status and demonstrates the ability to make significant improvements in function in a reasonable and predictable amount of time.     Equipment Recommendations   None recommended by OT             Precautions/Restrictions   Precautions Precautions: Fall Recall of Precautions/Restrictions: Impaired Restrictions Weight Bearing Restrictions Per Provider Order: No     Mobility Bed Mobility Overal bed mobility: Needs Assistance Bed Mobility: Supine to Sit     Supine to sit: Min assist, HOB elevated     General bed mobility comments: labored movement    Transfers Overall transfer level: Needs assistance Equipment used: Rolling walker (2 wheels) Transfers: Sit to/from Stand, Bed to chair/wheelchair/BSC Sit to Stand: Min assist     Step pivot transfers: Contact guard assist, Min assist     General transfer comment: labored movement; unsteady; verbal cuing needed      Balance Overall balance assessment: Needs assistance Sitting-balance support: Feet supported, No upper extremity supported Sitting balance-Leahy Scale: Fair Sitting balance - Comments: fair to good at EOB   Standing balance support: Bilateral upper extremity supported, During functional activity, Reliant on assistive device for balance Standing balance-Leahy Scale: Fair Standing balance comment: using RW                           ADL either  performed or assessed with clinical judgement   ADL Overall ADL's : Needs assistance/impaired Eating/Feeding: Set up;Sitting   Grooming: Set up;Sitting   Upper Body Bathing: Set up;Sitting   Lower Body Bathing: Maximal assistance;Moderate assistance;Sitting/lateral leans   Upper  Body Dressing : Set up;Sitting   Lower Body Dressing: Moderate assistance;Maximal assistance;Sitting/lateral leans   Toilet Transfer: Minimal assistance;Rolling walker (2 wheels);Ambulation Toilet Transfer Details (indicate cue type and reason): chair to toilet Toileting- Clothing Manipulation and Hygiene: Maximal assistance;Sit to/from stand Toileting - Clothing Manipulation Details (indicate cue type and reason): assited for peri-care while standing with RW     Functional mobility during ADLs: Contact guard assist;Minimal assistance;Rolling walker (2 wheels)       Vision Baseline Vision/History: 1 Wears glasses Ability to See in Adequate Light: 1 Impaired Patient Visual Report: No change from baseline Vision Assessment?: No apparent visual deficits     Perception Perception: Not tested       Praxis Praxis: Not tested       Pertinent Vitals/Pain Pain Assessment Pain Assessment: No/denies pain     Extremity/Trunk Assessment Upper Extremity Assessment Upper Extremity Assessment: Generalized weakness (3-/5 R shoulder weakness.)   Lower Extremity Assessment Lower Extremity Assessment: Defer to PT evaluation   Cervical / Trunk Assessment Cervical / Trunk Assessment: Kyphotic   Communication Communication Communication: Impaired Factors Affecting Communication: Hearing impaired   Cognition Arousal: Alert Behavior During Therapy: WFL for tasks assessed/performed Cognition: History of cognitive impairments, Cognition impaired             OT - Cognition Comments: Family reports pt's cognition is returning to near baseline but not quite there.                 Following commands: Intact       Cueing  General Comments   Cueing Techniques: Verbal cues;Tactile cues      Exercises     Shoulder Instructions      Home Living Family/patient expects to be discharged to:: Private residence Living Arrangements: Alone Available Help at Discharge: Personal  care attendant;Available 24 hours/day Type of Home: House Home Access: Level entry (1 small elevated surface)     Home Layout: Two level;Able to live on main level with bedroom/bathroom Alternate Level Stairs-Number of Steps: 12   Bathroom Shower/Tub: Sponge bathes at baseline;Tub/shower unit   Bathroom Toilet:  (comfort height)     Home Equipment: Rollator (4 wheels);BSC/3in1;Cane - single point;Lift chair;Shower seat          Prior Functioning/Environment Prior Level of Function : Needs assist       Physical Assist : Mobility (physical);ADLs (physical) Mobility (physical): Transfers;Gait;Bed mobility;Stairs ADLs (physical): Feeding;Grooming;Bathing;Toileting;Dressing;IADLs Mobility Comments: Supervision assist using rollator in the house. ADLs Comments: Assist all ADL's. Set up assist for feeding and grooming. Care providers 24/7.    OT Problem List: Decreased strength;Impaired balance (sitting and/or standing);Decreased cognition   OT Treatment/Interventions: Self-care/ADL training;Therapeutic exercise;Therapeutic activities;Balance training;DME and/or AE instruction;Patient/family education      OT Goals(Current goals can be found in the care plan section)   Acute Rehab OT Goals Patient Stated Goal: continue to improve function OT Goal Formulation: With family Time For Goal Achievement: 07/25/23 Potential to Achieve Goals: Good   OT Frequency:  Min 1X/week    Co-evaluation PT/OT/SLP Co-Evaluation/Treatment: Yes Reason for Co-Treatment: To address functional/ADL transfers   OT goals addressed during session: ADL's and self-care  End of Session Equipment Utilized During Treatment: Rolling walker (2 wheels);Gait belt  Activity Tolerance: Patient tolerated treatment well Patient left: in chair;with call bell/phone within reach;with chair alarm set;with family/visitor present  OT Visit Diagnosis: Unsteadiness on feet  (R26.81);Other abnormalities of gait and mobility (R26.89);Muscle weakness (generalized) (M62.81);Other symptoms and signs involving cognitive function                Time: 1610-9604 OT Time Calculation (min): 28 min Charges:  OT General Charges $OT Visit: 1 Visit OT Evaluation $OT Eval Low Complexity: 1 Low  Macallister Ashmead OT, MOT  Thurnell Floss 07/11/2023, 9:52 AM

## 2023-07-11 NOTE — Progress Notes (Signed)
 Mobility Specialist Progress Note:    07/11/23 1205  Mobility  Activity Ambulated with assistance in room;Transferred to/from [facility]  Level of Assistance Minimal assist, patient does 75% or more  Assistive Device Front wheel walker  Distance Ambulated (ft) 5 ft  Range of Motion/Exercises Active;All extremities  Activity Response Tolerated well  Mobility Referral Yes  Mobility visit 1 Mobility  Mobility Specialist Start Time (ACUTE ONLY) 1147  Mobility Specialist Stop Time (ACUTE ONLY) 1201  Mobility Specialist Time Calculation (min) (ACUTE ONLY) 14 min   Pt received in chair, caregiver in room requesting assistance. Required MinA to stand and ambulate with RW. Tolerated well, left on BSC with NT. All needs met.   Glinda Lapping Mobility Specialist Please contact via Special educational needs teacher or  Rehab office at [phone]

## 2023-07-12 DIAGNOSIS — I10 Essential (primary) hypertension: Secondary | ICD-10-CM

## 2023-07-12 DIAGNOSIS — T83511A Infection and inflammatory reaction due to indwelling urethral catheter, initial encounter: Secondary | ICD-10-CM | POA: Diagnosis not present

## 2023-07-12 DIAGNOSIS — G4733 Obstructive sleep apnea (adult) (pediatric): Secondary | ICD-10-CM

## 2023-07-12 DIAGNOSIS — N390 Urinary tract infection, site not specified: Secondary | ICD-10-CM | POA: Diagnosis not present

## 2023-07-12 DIAGNOSIS — I4891 Unspecified atrial fibrillation: Secondary | ICD-10-CM | POA: Diagnosis not present

## 2023-07-12 LAB — CULTURE, BLOOD (ROUTINE X 2)

## 2023-07-12 LAB — PROTIME-INR
INR: 2.8 — ABNORMAL HIGH (ref 0.8–1.2)
Prothrombin Time: 29.8 s — ABNORMAL HIGH (ref 11.4–15.2)

## 2023-07-12 MED ORDER — CHLORHEXIDINE GLUCONATE CLOTH 2 % EX PADS
6.0000 | MEDICATED_PAD | Freq: Every day | CUTANEOUS | Status: DC
  Administered 2023-07-12 – 2023-07-15 (×4): 6 via TOPICAL

## 2023-07-12 MED ORDER — WARFARIN SODIUM 2.5 MG PO TABS
2.5000 mg | ORAL_TABLET | Freq: Once | ORAL | Status: AC
  Administered 2023-07-12: 2.5 mg via ORAL
  Filled 2023-07-12: qty 1

## 2023-07-12 MED ORDER — AMLODIPINE BESYLATE 5 MG PO TABS
5.0000 mg | ORAL_TABLET | Freq: Every day | ORAL | Status: DC
  Administered 2023-07-12 – 2023-07-15 (×4): 5 mg via ORAL
  Filled 2023-07-12 (×5): qty 1

## 2023-07-12 NOTE — Hospital Course (Signed)
 89 y.o. male with a PMH significant forHTN, history of pancolonic diverticulosis, history of reflux esophagitis and gastric ulcer, obesity and OSA, BPH with LUTS requiring chronic indwelling Foley, COPD, chronic atrial fibrillation on chronic anticoagulation with Coumadin  . - Patient was brought to ED by EMS, daughter at bedside, assists with the history, he is living home by himself, but he is having 24-hour care, patient has been with progressive weakness, unsteady gait, has been more confused his baseline, has poor appetite, so EMS were called, when they were there noted him to have fever 102.8 so he was brought to ED for further evaluation, he is with known history of chronic Foley catheter, followed by Dr. Claretta Croft, exchanged every 30 days, next exchange is on Monday. - In ED he had fever 99.9, initially blood pressure soft but responded to fluids, he has been strongly positive, CT abdomen pelvis significant for known AAA, unchanged, stable benign hepatic masses, patient was started on IV antibiotics and [facility] hospitalist consulted to admit

## 2023-07-12 NOTE — TOC Progression Note (Addendum)
 Transition of Care [facility]) - Progression Note    Patient Details  Name: LAWRANCE WIEDEMANN MRN: [id number] Date of Birth: [birth year]  Transition of Care [facility]) CM/SW Contact  Ander Katos, [place] Phone Number: 07/12/2023, 3:10 PM  Clinical Narrative: Pt's daughter accepts bed at [facility]. CMA to update authorization. [facility] can accept pt on Monday. MD notified.     Update: Auth approved through Tuesday.     Barriers to Discharge: Continued Medical Work up  Expected Discharge Plan and Services In-house Referral: Clinical Social Work   Post Acute Care Choice: Durable Medical Equipment Living arrangements for the past 2 months: Single Family Home                                       Social Determinants of Health (SDOH) Interventions SDOH Screenings   Food Insecurity: Patient Unable To Answer (07/11/2023)  Housing: Patient Unable To Answer (07/11/2023)  Transportation Needs: Patient Unable To Answer (07/11/2023)  Utilities: Not At Risk (02/12/2023)   Received from [facility]  Alcohol Screen: Low Risk  (03/27/2022)  Depression (PHQ2-9): High Risk (05/31/2023)  Financial Resource Strain: Low Risk  (04/10/2023)  Physical Activity: Inactive (04/10/2023)  Social Connections: Unknown (07/11/2023)  Stress: No Stress Concern Present (04/10/2023)  Tobacco Use: Medium Risk (07/10/2023)    Readmission Risk Interventions    07/11/2023   11:51 AM  Readmission Risk Prevention Plan  Transportation Screening Complete  Home Care Screening Complete  Medication Review (RN CM) Complete

## 2023-07-12 NOTE — Progress Notes (Signed)
 Occupational Therapy Treatment Patient Details Name: Tristan Lopez MRN: [id number] DOB: 1930/08/25 Today's Date: 07/12/2023   History of present illness Tristan Lopez  is a 89 y.o. male, with past medical history relevant for HTN, history of pancolonic diverticulosis, history of reflux esophagitis and gastric ulcer, obesity and OSA, BPH with LUTS requiring chronic indwelling Foley, COPD, chronic atrial fibrillation on chronic anticoagulation with Coumadin  .  - Patient was brought to ED by EMS, daughter at bedside, assists with the history, he is living home by himself, but he is having 24-hour care, patient has been with progressive weakness, unsteady gait, has been more confused his baseline, has poor appetite, so EMS were called, when they were there noted him to have fever 102.8 so he was brought to ED for further evaluation, he is with known history of chronic Foley catheter, followed by Dr. Claretta Croft, exchanged every 30 days, next exchange is on Monday.  - In ED he had fever 99.9, initially blood pressure soft but responded to fluids, he has been strongly positive, CT abdomen pelvis significant for known AAA, unchanged, stable benign hepatic masses, patient was started on IV antibiotics and [facility] hospitalist consulted to admit   OT comments  Pt agreeable to OT and PT co-treatment. Pt demonstrated min to mod A fir sit to stand. Pt noted to be slouched in the chair to start the session with blocking of knees needed when the foot rest was dropped. Pt able to engage in B UE strengthening in the chair as well as x3 reps of sit to stand from the chair with RW. Pt left on the [facility] after reporting the need to use the bathroom at the end of the session. Care provider in the room and instructed to hit the call bell when the pt was finished.       If plan is discharge home, recommend the following:  A little help with walking and/or transfers;A lot of help with bathing/dressing/bathroom;Assistance with  cooking/housework;Assist for transportation;Help with stairs or ramp for entrance;Direct supervision/assist for medications management   Equipment Recommendations  None recommended by OT          Precautions / Restrictions Precautions Precautions: Fall Restrictions Weight Bearing Restrictions Per Provider Order: No       Mobility Bed Mobility               General bed mobility comments: Pt seated in the chair to start the session.    Transfers Overall transfer level: Needs assistance Equipment used: Rolling walker (2 wheels) Transfers: Sit to/from Stand, Bed to chair/wheelchair/BSC Sit to Stand: Min assist, Mod assist     Step pivot transfers: Min assist, Mod assist     General transfer comment: labored movement; unsteady; verbal cuing needed; EOB to [facility]; x3 reps of sit to stand from chair     Balance Overall balance assessment: Needs assistance (Simultaneous filing. User may not have seen previous data.) Sitting-balance support: Feet supported, No upper extremity supported (Simultaneous filing. User may not have seen previous data.) Sitting balance-Leahy Scale: Fair (Simultaneous filing. User may not have seen previous data.) Sitting balance - Comments: fair in the recliner (Simultaneous filing. User may not have seen previous data.)   Standing balance support: Bilateral upper extremity supported, During functional activity, Reliant on assistive device for balance (Simultaneous filing. User may not have seen previous data.) Standing balance-Leahy Scale: Fair (Simultaneous filing. User may not have seen previous data.) Standing balance comment: using RW (Simultaneous filing. User may not have  seen previous data.)                           ADL either performed or assessed with clinical judgement   ADL Overall ADL's : Needs assistance/impaired                                     Functional mobility during ADLs: Minimal assistance;Rolling  walker (2 wheels) General ADL Comments: Able to ambulate to the door and back with RW and extended time.     Communication Communication Communication: Impaired Factors Affecting Communication: Hearing impaired   Cognition Arousal: Alert Behavior During Therapy: WFL for tasks assessed/performed Cognition: History of cognitive impairments, Cognition impaired                               Following commands: Intact        Cueing   Cueing Techniques: Verbal cues, Tactile cues  Exercises Exercises: General Upper Extremity General Exercises - Upper Extremity Shoulder Flexion: 10 reps, Both, AROM, Seated Shoulder ABduction: AROM, Both, 10 reps, Seated Shoulder Horizontal ABduction: AROM, Both, Seated (x10 protraction as well; horizontal abduction limited to x8 before pt fatigued.)    Shoulder Instructions       General Comments      Pertinent Vitals/ Pain       Pain Assessment Pain Assessment: Faces Faces Pain Scale: Hurts little more Pain Location: low back pain Pain Descriptors / Indicators: Grimacing, Guarding Pain Intervention(s): Limited activity within patient's tolerance, Monitored during session, Repositioned                                                          Frequency  Min 1X/week        Progress Toward Goals  OT Goals(current goals can now be found in the care plan section)  Progress towards OT goals: Progressing toward goals  Acute Rehab OT Goals Patient Stated Goal: continue to improve function OT Goal Formulation: With family Time For Goal Achievement: 07/25/23 Potential to Achieve Goals: Good ADL Goals Pt Will Perform Grooming: standing;with supervision Pt Will Perform Lower Body Dressing: with min assist;sitting/lateral leans Pt Will Transfer to Toilet: with supervision;ambulating Pt Will Perform Toileting - Clothing Manipulation and hygiene: with min assist;sitting/lateral leans Pt/caregiver will  Perform Home Exercise Program: Increased strength;Both right and left upper extremity;Independently  Plan      Co-evaluation    PT/OT/SLP Co-Evaluation/Treatment: Yes Reason for Co-Treatment: To address functional/ADL transfers PT goals addressed during session: Mobility/safety with mobility;Balance;Proper use of DME OT goals addressed during session: ADL's and self-care                          End of Session Equipment Utilized During Treatment: Rolling walker (2 wheels);Gait belt  OT Visit Diagnosis: Unsteadiness on feet (R26.81);Other abnormalities of gait and mobility (R26.89);Muscle weakness (generalized) (M62.81);Other symptoms and signs involving cognitive function   Activity Tolerance Patient tolerated treatment well   Patient Left Other (comment) (on [facility] with care porvider in room with instruction to hit the call bell when the pt was done using the [facility])  Nurse Communication          Time: 4098-1191 OT Time Calculation (min): 23 min  Charges: OT General Charges $OT Visit: 1 Visit OT Treatments $Therapeutic Exercise: 8-22 mins  Lyndell Allaire OT, MOT  Thurnell Floss 07/12/2023, 2:29 PM

## 2023-07-12 NOTE — Progress Notes (Signed)
 Physical Therapy Treatment Patient Details Name: Tristan Lopez MRN: [id number] DOB: 08-30-1930 Today's Date: 07/12/2023   History of Present Illness Tristan Lopez  is a 89 y.o. male, with past medical history relevant for HTN, history of pancolonic diverticulosis, history of reflux esophagitis and gastric ulcer, obesity and OSA, BPH with LUTS requiring chronic indwelling Foley, COPD, chronic atrial fibrillation on chronic anticoagulation with Coumadin  .  - Patient was brought to ED by EMS, daughter at bedside, assists with the history, he is living home by himself, but he is having 24-hour care, patient has been with progressive weakness, unsteady gait, has been more confused his baseline, has poor appetite, so EMS were called, when they were there noted him to have fever 102.8 so he was brought to ED for further evaluation, he is with known history of chronic Foley catheter, followed by Dr. Claretta Croft, exchanged every 30 days, next exchange is on Monday.  - In ED he had fever 99.9, initially blood pressure soft but responded to fluids, he has been strongly positive, CT abdomen pelvis significant for known AAA, unchanged, stable benign hepatic masses, patient was started on IV antibiotics and [facility] hospitalist consulted to admit    PT Comments  Patient tolerated PT/OT session well overall. Patient received in recliner at start of session. Patient requiring moderate Ax1 for repositioning prior to transfer. LE exercises performed seated position, pt requiring verbal and visual cueing. Min/mod A for STS with RW. Pt ambulates in room with RW and min A, limited due to fatigue. Patient returned to recliner and left with OT for remainder of session. Patient will benefit from continued skilled physical therapy acutely and in recommended venue in order to address strength, endurance, and balance to improve function/QOL.     If plan is discharge home, recommend the following: A lot of help with  bathing/dressing/bathroom;A lot of help with walking and/or transfers;Help with stairs or ramp for entrance;Assistance with cooking/housework   Can travel by private vehicle     No  Equipment Recommendations  None recommended by PT    Recommendations for Other Services       Precautions / Restrictions Precautions Precautions: Fall Recall of Precautions/Restrictions: Impaired Restrictions Weight Bearing Restrictions Per Provider Order: No     Mobility  Bed Mobility     General bed mobility comments: Pt received in recliner at start of session. Mod assist w/ repositioning in recliner    Transfers Overall transfer level: Needs assistance Equipment used: Rolling walker (2 wheels) Transfers: Sit to/from Stand Sit to Stand: Min assist, Mod assist   Step pivot transfers: Contact guard assist, Min assist       General transfer comment: Slow labored movement, verbal cues for 1:1 hand placement    Ambulation/Gait Ambulation/Gait assistance: Min assist Gait Distance (Feet): 20 Feet Assistive device: Rolling walker (2 wheels) Gait Pattern/deviations: Decreased step length - right, Decreased step length - left, Decreased stride length, Trunk flexed, Wide base of support Gait velocity: decreased     General Gait Details: Slow labored movement while ambulating within room. verbal cues for proximity to RW. Pt limited due to fatigue. requests to sit   Stairs         Wheelchair Mobility     Tilt Bed    Modified Rankin (Stroke Patients Only)       Balance Overall balance assessment: Needs assistance Sitting-balance support: Feet supported, No upper extremity supported Sitting balance-Leahy Scale: Fair Sitting balance - Comments: fair seated in recliner, posterior lean at  times   Standing balance support: Bilateral upper extremity supported, During functional activity, Reliant on assistive device for balance Standing balance-Leahy Scale: Fair Standing balance  comment: using RW          Communication Communication Communication: Impaired Factors Affecting Communication: Hearing impaired  Cognition Arousal: Alert Behavior During Therapy: WFL for tasks assessed/performed   PT - Cognitive impairments: History of cognitive impairments           Following commands: Intact      Cueing Cueing Techniques: Verbal cues, Tactile cues  Exercises General Exercises - Lower Extremity Long Arc Quad: AROM, Strengthening, Both, 15 reps, Seated Hip Flexion/Marching: AROM, Strengthening, Both, 10 reps, Seated    General Comments        Pertinent Vitals/Pain Pain Assessment Pain Assessment: Faces Faces Pain Scale: No hurt    Home Living                          Prior Function            PT Goals (current goals can now be found in the care plan section) Acute Rehab PT Goals Patient Stated Goal: return home PT Goal Formulation: With patient/family Time For Goal Achievement: 07/25/23 Potential to Achieve Goals: Good Progress towards PT goals: Progressing toward goals    Frequency    Min 3X/week      PT Plan      Co-evaluation PT/OT/SLP Co-Evaluation/Treatment: Yes Reason for Co-Treatment: To address functional/ADL transfers PT goals addressed during session: Mobility/safety with mobility;Balance;Proper use of DME OT goals addressed during session: ADL's and self-care      AM-PAC PT 6 Clicks Mobility   Outcome Measure  Help needed turning from your back to your side while in a flat bed without using bedrails?: A Little Help needed moving from lying on your back to sitting on the side of a flat bed without using bedrails?: A Little Help needed moving to and from a bed to a chair (including a wheelchair)?: A Little Help needed standing up from a chair using your arms (e.g., wheelchair or bedside chair)?: A Little Help needed to walk in hospital room?: A Lot Help needed climbing 3-5 steps with a railing? : A  Lot 6 Click Score: 16    End of Session Equipment Utilized During Treatment: Gait belt Activity Tolerance: Patient tolerated treatment well Patient left: in chair;with call bell/phone within reach;with chair alarm set;with family/visitor present (Pt left with OT remainder of session)   PT Visit Diagnosis: Unsteadiness on feet (R26.81);Other abnormalities of gait and mobility (R26.89);Muscle weakness (generalized) (M62.81)     Time: 1610-9604 PT Time Calculation (min) (ACUTE ONLY): 13 min  Charges:    $Therapeutic Activity: 8-22 mins PT General Charges $$ ACUTE PT VISIT: 1 Visit                     2:31 PM, 07/12/23 Tyanne Derocher Powell-Butler, PT, DPT [place] with [facility]

## 2023-07-12 NOTE — Care Management Important Message (Signed)
 Important Message  Patient Details  Name: Tristan Lopez MRN: [id number] Date of Birth: Jan 06, 1931   Important Message Given:  Yes - Medicare IM     [name] 07/12/2023, 9:42 AM

## 2023-07-12 NOTE — Progress Notes (Signed)
 PROGRESS NOTE   Tristan Lopez  WUJ:[id number] DOB: [birth year] DOA: 07/10/2023 PCP: Alison Irvine, FNP   Chief Complaint  Patient presents with   Weakness   Fever   Level of care: Med-Surg  Brief Admission History:  89 y.o. male with a PMH significant forHTN, history of pancolonic diverticulosis, history of reflux esophagitis and gastric ulcer, obesity and OSA, BPH with LUTS requiring chronic indwelling Foley, COPD, chronic atrial fibrillation on chronic anticoagulation with Coumadin  . - Patient was brought to ED by EMS, daughter at bedside, assists with the history, he is living home by himself, but he is having 24-hour care, patient has been with progressive weakness, unsteady gait, has been more confused his baseline, has poor appetite, so EMS were called, when they were there noted him to have fever 102.8 so he was brought to ED for further evaluation, he is with known history of chronic Foley catheter, followed by Dr. Claretta Croft, exchanged every 30 days, next exchange is on Monday. - In ED he had fever 99.9, initially blood pressure soft but responded to fluids, he has been strongly positive, CT abdomen pelvis significant for known AAA, unchanged, stable benign hepatic masses, patient was started on IV antibiotics and [facility] hospitalist consulted to admit   Assessment and Plan:  Catheter associated UTI: - With chronic indwelling Foley, exchanged in the emergency department. - Workup significant for leukocytosis, fever 102.8, UA is positive--> improved after initiation of antibiotics and transferred to hospital floor - treated with IV Rocephin  - urine culture was negative for UTI, DC antibiotics   chronic atrial fibrillation -Continue propanolol for rate control -Continue with warfarin, INR therapeutic, pharmacy to dose   Hyponatremia: - Mild - recheck labs in AM  - SSRI increases risk of hyponatremia at his age, if worsens would hold and defer to outpatient   Hypokalemia: -  Repleted - recheck in AM    Leukocytosis: - Secondary to 1 above. - Downtrending, continue antibiotics   Normocytic anemia: -Chronic, at baseline, will trend.  No evidence of bleeding   COPD - Able, no wheezing, no dyspnea, continue with as needed nebs    HTN - Amlodipine  restarted 6/20      HLD - Continue with statin   Ambulatory dysfunction, deconditioning - Will consult PT/OT -- SNF recommended - Pt to go to SNF on 6/23 per [facility]    BPH Chronic Foley catheter -he is following with urology Dr. Claretta Croft, exchanging his Foley catheter every month, next exchange is due on Monday, will exchange given his UTI   Class 1 Obesity - There is no height or weight on file to calculate BMI.   OSA - Continue with CPAP nightly   AAA - History of AAA, CT scan noted   Dementia -Continue with supportive care -Daughter very supportive and at bedside  DVT prophylaxis: warfarin Code Status: DNR  Family Communication: son at bedside  Disposition:    Consultants:   Procedures:   Antimicrobials:    Subjective: Pt resting comfortably Objective: Vitals:   07/11/23 2018 07/12/23 0629 07/12/23 0943 07/12/23 1415  BP: 131/78 (!) 155/78 (!) 140/80 (!) 143/67  Pulse: 72 73 70 70  Resp: 17 19  18   Temp: 98.1 F (36.7 C) 98.5 F (36.9 C)  98.6 F (37 C)  TempSrc: Oral   Oral  SpO2: 98% 96%  100%    Intake/Output Summary (Last 24 hours) at 07/12/2023 1735 Last data filed at 07/12/2023 1343 Gross per 24 hour  Intake 580 ml  Output --  Net 580 ml   There were no vitals filed for this visit. Examination:  General exam: Appears calm and comfortable  Respiratory system: Clear to auscultation. Respiratory effort normal. Cardiovascular system: normal S1 & S2 heard.  Gastrointestinal system: Abdomen is nondistended, soft and nontender. No organomegaly or masses felt. Normal bowel sounds heard. Central nervous system: dementia at baseline. No focal neurological  deficits. Extremities: no cyanotic changes. Skin: No rashes, lesions or ulcers. Psychiatry: resting, unable to assess.    Data Reviewed: I have personally reviewed following labs and imaging studies  CBC: Recent Labs  Lab 07/08/23 1116 07/10/23 1719 07/11/23 0436  WBC 5.4 13.4* 11.7*  NEUTROABS 2.9 11.5*  --   HGB 11.8* 11.6* 10.6*  HCT 36.8* 36.1* 33.6*  MCV 94 93.0 94.1  PLT 242 216 165    Basic Metabolic Panel: Recent Labs  Lab 07/08/23 1116 07/10/23 1719 07/11/23 0436  NA 136 132* 132*  K 4.8 3.7 3.4*  CL 96 96* 99  CO2 23 25 26   GLUCOSE 89 142* 106*  BUN 12 10 11   CREATININE 0.73* 0.72 0.59*  CALCIUM  9.3 8.9 8.3*    CBG: No results for input(s): GLUCAP in the last 168 hours.  Recent Results (from the past 240 hours)  Culture, blood (Routine x 2)     Status: None (Preliminary result)   Collection Time: 07/10/23  5:19 PM   Specimen: BLOOD  Result Value Ref Range Status   Specimen Description BLOOD BLOOD LEFT WRIST  Final   Special Requests   Final    BOTTLES DRAWN AEROBIC AND ANAEROBIC Blood Culture results may not be optimal due to an inadequate volume of blood received in culture bottles   Culture   Final    NO GROWTH 2 DAYS Performed at [facility], [address], [place], [place] [address]    Report Status PENDING  Incomplete  Culture, blood (Routine x 2)     Status: None (Preliminary result)   Collection Time: 07/10/23  5:20 PM   Specimen: BLOOD  Result Value Ref Range Status   Specimen Description BLOOD RIGHT ANTECUBITAL  Final   Special Requests   Final    BOTTLES DRAWN AEROBIC AND ANAEROBIC Blood Culture adequate volume   Culture   Final    NO GROWTH 2 DAYS Performed at [facility], [address], [place], [place] [address]    Report Status PENDING  Incomplete  Resp panel by RT-PCR (RSV, Flu A&B, Covid) Anterior Nasal Swab     Status: None   Collection Time: 07/10/23  6:20 PM   Specimen: Anterior Nasal Swab  Result Value Ref Range  Status   SARS Coronavirus 2 by RT PCR NEGATIVE NEGATIVE Final    Comment: (NOTE) SARS-CoV-2 target nucleic acids are NOT DETECTED.  The SARS-CoV-2 RNA is generally detectable in upper respiratory specimens during the acute phase of infection. The lowest concentration of SARS-CoV-2 viral copies this assay can detect is 138 copies/mL. A negative result does not preclude SARS-Cov-2 infection and should not be used as the sole basis for treatment or other patient management decisions. A negative result may occur with  improper specimen collection/handling, submission of specimen other than nasopharyngeal swab, presence of viral mutation(s) within the areas targeted by this assay, and inadequate number of viral copies(<138 copies/mL). A negative result must be combined with clinical observations, patient history, and epidemiological information. The expected result is Negative.  Fact Sheet for Patients:  BloggerCourse.com  Fact Sheet  for Healthcare Providers:  SeriousBroker.it  This test is no t yet approved or cleared by the [place]  FDA and  has been authorized for detection and/or diagnosis of SARS-CoV-2 by FDA under an Emergency Use Authorization (EUA). This EUA will remain  in effect (meaning this test can be used) for the duration of the COVID-19 declaration under Section 564(b)(1) of the Act, 21 U.S.C.section 360bbb-3(b)(1), unless the authorization is terminated  or revoked sooner.       Influenza A by PCR NEGATIVE NEGATIVE Final   Influenza B by PCR NEGATIVE NEGATIVE Final    Comment: (NOTE) The Xpert Xpress SARS-CoV-2/FLU/RSV plus assay is intended as an aid in the diagnosis of influenza from Nasopharyngeal swab specimens and should not be used as a sole basis for treatment. Nasal washings and aspirates are unacceptable for Xpert Xpress SARS-CoV-2/FLU/RSV testing.  Fact Sheet for  Patients: BloggerCourse.com  Fact Sheet for Healthcare Providers: SeriousBroker.it  This test is not yet approved or cleared by the [place]  FDA and has been authorized for detection and/or diagnosis of SARS-CoV-2 by FDA under an Emergency Use Authorization (EUA). This EUA will remain in effect (meaning this test can be used) for the duration of the COVID-19 declaration under Section 564(b)(1) of the Act, 21 U.S.C. section 360bbb-3(b)(1), unless the authorization is terminated or revoked.     Resp Syncytial Virus by PCR NEGATIVE NEGATIVE Final    Comment: (NOTE) Fact Sheet for Patients: BloggerCourse.com  Fact Sheet for Healthcare Providers: SeriousBroker.it  This test is not yet approved or cleared by the [place]  FDA and has been authorized for detection and/or diagnosis of SARS-CoV-2 by FDA under an Emergency Use Authorization (EUA). This EUA will remain in effect (meaning this test can be used) for the duration of the COVID-19 declaration under Section 564(b)(1) of the Act, 21 U.S.C. section 360bbb-3(b)(1), unless the authorization is terminated or revoked.  Performed at [facility], [address], [place], [place] [address]   Urine Culture     Status: Abnormal   Collection Time: 07/10/23  6:25 PM   Specimen: Urine, Random  Result Value Ref Range Status   Specimen Description   Final    URINE, RANDOM Performed at [facility], [address] [place], [place] [address]    Special Requests   Final    NONE Reflexed from [id number] Performed at [facility], [address], [place], [place] [address]    Culture MULTIPLE SPECIES PRESENT, SUGGEST RECOLLECTION (A)  Final   Report Status 07/11/2023 FINAL  Final     Radiology Studies: CT ABDOMEN PELVIS W CONTRAST Result Date: 07/10/2023 EXAM: CT ABDOMEN AND PELVIS WITH CONTRAST 07/10/2023 07:18:52 PM TECHNIQUE: CT of  the abdomen and pelvis was performed with the administration of intravenous contrast. Multiplanar reformatted images are provided for review. Automated exposure control, iterative reconstruction, and/or weight based adjustment of the mA/kV was utilized to reduce the radiation dose to as low as reasonably achievable. COMPARISON: 01/06/2023 CLINICAL HISTORY: Abdominal pain, acute, nonlocalized. Pt bib ems for fever of 102.8 and weakness that started today. Pt normally able to walk to bathroom and has done so earlier today but, is now experiencing weakness since 3 pm. Pt disoriented to time and place. Pt had a fall 3 weeks ago, bruised noted on abdominal area. Given 325 tylenol  pta. FINDINGS: LOWER CHEST: Small hiatal hernia. LIVER: Chronic rim calcified heterogeneous masses along the right hepatic dome and lateral segment left hepatic lobe, measuring 6.8 and 7.2 cm respectively, benign.  GALLBLADDER AND BILE DUCTS: Gallbladder is unremarkable. No biliary ductal dilatation. SPLEEN: No acute abnormality. PANCREAS: No acute abnormality. ADRENAL GLANDS: No acute abnormality. KIDNEYS, URETERS AND BLADDER: Bilateral simple renal cysts, measuring up to 7.4 cm in the posterior right upper kidney, benign (Bosniak 1). No follow-up is recommended. Two nonobstructing left lower pole renal calculi measuring up to 5 mm. No hydronephrosis. No perinephric or periureteral stranding. Urinary bladder decompressed by indwelling foley catheter. GI AND BOWEL: Left colonic diverticulosis, without evidence of diverticulitis. The appendix is not discretely visualized. No bowel obstruction. No bowel wall thickening. PERITONEUM AND RETROPERITONEUM: No ascites. No free air. VASCULATURE: 4.2 cm infrarenal abdominal aortic aneurysm, unchanged. Atherosclerotic calcifications of the abdominal aorta and branch vessels. LYMPH NODES: No lymphadenopathy. REPRODUCTIVE ORGANS: Prostatomegaly, with enlargement of the central gland indenting the base of the  bladder, suggesting BPH. BONES AND SOFT TISSUES: Degenerative changes of the visualized thoracolumbar spine. No acute osseous abnormality. No focal soft tissue abnormality. IMPRESSION: 1. No acute findings in the abdomen or pelvis. 2. 4.2 cm infrarenal abdominal aortic aneurysm, unchanged. Given age, annual follow-up is considered optional. 3. Stable hepatic masses, benign. 4. Additional ancillary findings as above. Electronically signed by: Zadie Herter MD 07/10/2023 07:30 PM EDT RP Workstation: [id number]    Scheduled Meds:  vitamin C  1,000 mg Oral Daily   Chlorhexidine  Gluconate Cloth  6 each Topical Daily   cholecalciferol  2,000 Units Oral Daily   citalopram   20 mg Oral Daily   ferrous sulfate   325 mg Oral Q supper   Gerhardt's butt cream   Topical TID   mirabegron  ER  25 mg Oral Daily   pantoprazole   40 mg Oral Daily   propranolol   20 mg Oral Daily   rosuvastatin   10 mg Oral Daily   saccharomyces boulardii  250 mg Oral BID   Warfarin - Pharmacist Dosing Inpatient   Does not apply q1600   Continuous Infusions:   LOS: 2 days   Time spent: 55 mins  Farrell Broerman Lincoln Renshaw, MD How to contact the [facility] Attending or Consulting provider 7A - 7P or covering provider during after hours 7P -7A, for this patient?  Check the care team in [facility] and look for a) attending/consulting [facility] provider listed and b) the [facility] team listed Log into [email or web address] to find provider on call.  Locate the [facility] provider you are looking for under [facility] and page to a number that you can be directly reached. If you still have difficulty reaching the provider, please page the [facility] (Director on Call) for the Hospitalists listed on amion for assistance.  07/12/2023, 5:35 PM

## 2023-07-12 NOTE — Consult Note (Signed)
 Pharmacy Consult Note - Anticoagulation  Pharmacy Consult for warfarin Indication: atrial fibrillation  PATIENT MEASUREMENTS:      VITAL SIGNS: Temp: 98.5 F (36.9 C) (06/20 0629) Temp Source: Oral (06/19 2018) BP: 155/78 (06/20 0629) Pulse Rate: 73 (06/20 0629)  Recent Labs    07/11/23 0436 07/12/23 0443  HGB 10.6*  --   HCT 33.6*  --   PLT 165  --   LABPROT 29.0* 29.8*  INR 2.7* 2.8*  CREATININE 0.59*  --     Estimated Creatinine Clearance: 71.6 mL/min (A) (by C-G formula based on SCr of 0.59 mg/dL (L)).  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 male with PMH including Afib, CKD, AAA, hx GIB is presenting with UTI. Patient is on chronic anticoagulation with warfarin per chart review. Pharmacy has been consulted to manage warfarin.  Pertinent medications: Per chart, as of 07/09/2023 anticoagulation clinic note, patient's regimen is: Warfarin 2.5mg  PO once daily on Sunday Warfarin 5mg  PO all other days Last dose prior to admission: 6/17  INR trending up, on antibiotics, which may slightly increase. Will give 1/2 dose today.  Goal(s) of therapy: INR 2 - 3 Monitor platelets by anticoagulation protocol: Yes   Baseline anticoagulation labs: Recent Labs    07/10/23 1719 07/11/23 0436 07/12/23 0443  INR 2.6* 2.7* 2.8*   HGB 11.6* 10.6*  --   PLT 216 165  --     Date INR Warfarin Dose 6/18 2.6 5 mg  6/19     2.7       5mg  6/20      2.8       2.5mg   PLAN: Warfarin 2.5mg  PO today CBC at least weekly for inpatients on warfarin Monitor for S/S of bleeding  Lora Robinsons, BS Pharm D, BCPS Clinical Pharmacist 07/12/2023 7:36 AM

## 2023-07-12 NOTE — Plan of Care (Signed)

## 2023-07-13 DIAGNOSIS — N182 Chronic kidney disease, stage 2 (mild): Secondary | ICD-10-CM

## 2023-07-13 DIAGNOSIS — J449 Chronic obstructive pulmonary disease, unspecified: Secondary | ICD-10-CM

## 2023-07-13 DIAGNOSIS — N390 Urinary tract infection, site not specified: Secondary | ICD-10-CM | POA: Diagnosis not present

## 2023-07-13 DIAGNOSIS — G4733 Obstructive sleep apnea (adult) (pediatric): Secondary | ICD-10-CM | POA: Diagnosis not present

## 2023-07-13 DIAGNOSIS — T83511A Infection and inflammatory reaction due to indwelling urethral catheter, initial encounter: Secondary | ICD-10-CM | POA: Diagnosis not present

## 2023-07-13 DIAGNOSIS — K219 Gastro-esophageal reflux disease without esophagitis: Secondary | ICD-10-CM | POA: Diagnosis not present

## 2023-07-13 LAB — BASIC METABOLIC PANEL WITH GFR
Anion gap: 7 (ref 5–15)
BUN: 12 mg/dL (ref 8–23)
CO2: 26 mmol/L (ref 22–32)
Calcium: 8.4 mg/dL — ABNORMAL LOW (ref 8.9–10.3)
Chloride: 99 mmol/L (ref 98–111)
Creatinine, Ser: 0.58 mg/dL — ABNORMAL LOW (ref 0.61–1.24)
GFR, Estimated: >60 mL/min (ref >60)
Glucose, Bld: 98 mg/dL (ref 70–99)
Potassium: 3.4 mmol/L — ABNORMAL LOW (ref 3.5–5.1)
Sodium: 132 mmol/L — ABNORMAL LOW (ref 135–145)

## 2023-07-13 LAB — PROTIME-INR
INR: 2.6 — ABNORMAL HIGH (ref 0.8–1.2)
Prothrombin Time: 28.1 s — ABNORMAL HIGH (ref 11.4–15.2)

## 2023-07-13 MED ORDER — WARFARIN SODIUM 5 MG PO TABS
5.0000 mg | ORAL_TABLET | Freq: Once | ORAL | Status: AC
  Administered 2023-07-13: 5 mg via ORAL
  Filled 2023-07-13: qty 1

## 2023-07-13 MED ORDER — POTASSIUM CHLORIDE CRYS ER 20 MEQ PO TBCR
20.0000 meq | EXTENDED_RELEASE_TABLET | Freq: Once | ORAL | Status: AC
  Administered 2023-07-13: 20 meq via ORAL
  Filled 2023-07-13: qty 1

## 2023-07-13 NOTE — Plan of Care (Signed)
   Problem: Activity: Goal: Risk for activity intolerance will decrease Outcome: Progressing   Problem: Coping: Goal: Level of anxiety will decrease Outcome: Progressing   Problem: Safety: Goal: Ability to remain free from injury will improve Outcome: Progressing

## 2023-07-13 NOTE — Progress Notes (Signed)
 PROGRESS NOTE   Tristan Lopez  FMW:[id number] DOB: 02-22-1930 DOA: 07/10/2023 PCP: Bevely Doffing, FNP   Chief Complaint  Patient presents with   Weakness   Fever   Level of care: Med-Surg  Brief Admission History:  89 y.o. male with a PMH significant forHTN, history of pancolonic diverticulosis, history of reflux esophagitis and gastric ulcer, obesity and OSA, BPH with LUTS requiring chronic indwelling Foley, COPD, chronic atrial fibrillation on chronic anticoagulation with Coumadin  . - Patient was brought to ED by EMS, daughter at bedside, assists with the history, he is living home by himself, but he is having 24-hour care, patient has been with progressive weakness, unsteady gait, has been more confused his baseline, has poor appetite, so EMS were called, when they were there noted him to have fever 102.8 so he was brought to ED for further evaluation, he is with known history of chronic Foley catheter, followed by Dr. Sherrilee, exchanged every 30 days, next exchange is on Monday. - In ED he had fever 99.9, initially blood pressure soft but responded to fluids, he has been strongly positive, CT abdomen pelvis significant for known AAA, unchanged, stable benign hepatic masses, patient was started on IV antibiotics and [facility] hospital[facility] consulted to admit   Assessment and Plan:  Catheter associated UTI: - With chronic indwelling Foley, exchanged in the emergency department. - Workup significant for leukocytosis, fever 102.8, UA is positive--> improved after initiation of antibiotics and transferred to hospital floor - treated with IV Rocephin  - urine culture was negative for UTI, DC antibiotics   chronic atrial fibrillation -Continue propanolol for rate control -Continue with warfarin, INR therapeutic, pharmacy to dose   Hyponatremia: - Mild - recheck labs in AM  - SSRI increases risk of hyponatremia at his age, if worsens would hold and defer to outpatient   Hypokalemia: -  Repleted - recheck in AM    Leukocytosis: - Secondary to 1 above. - Downtrending, continue antibiotics   Normocytic anemia: -Chronic, at baseline, will trend.  No evidence of bleeding   COPD - Able, no wheezing, no dyspnea, continue with as needed nebs    HTN - Amlodipine  restarted 6/20      HLD - Continue with statin   Ambulatory dysfunction, deconditioning - Will consult PT/OT -- SNF recommended - Pt to go to SNF on 6/23 per [facility]    BPH Chronic Foley catheter -he is following with urology Dr. Sherrilee, exchanging his Foley catheter every month, next exchange is due on Monday, will exchange given his UTI   Class 1 Obesity - There is no height or weight on file to calculate BMI.   OSA - Continue with CPAP nightly   AAA - History of AAA, CT scan noted   Dementia -Continue with supportive care -Daughter very supportive and at bedside  DVT prophylaxis: warfarin Code Status: DNR  Family Communication: son at bedside  Disposition: SNF on 6/23 [facility]   Consultants:   Procedures:   Antimicrobials:    Subjective: Pt resting comfortably Objective: Vitals:   07/12/23 2044 07/13/23 0419 07/13/23 0840 07/13/23 1315  BP: 137/80 (!) 158/74 (!) 148/65 132/72  Pulse: 70 71 73 68  Resp: 20 18  18   Temp: 98.9 F (37.2 C) 98.6 F (37 C)  98.4 F (36.9 C)  TempSrc: Oral Oral    SpO2: 99% 98%  99%    Intake/Output Summary (Last 24 hours) at 07/13/2023 1700 Last data filed at 07/13/2023 1302 Gross per 24 hour  Intake 440 ml  Output 1600 ml  Net -1160 ml   There were no vitals filed for this visit. Examination:  General exam: Appears calm and comfortable  Respiratory system: Clear to auscultation. Respiratory effort normal. Cardiovascular system: normal S1 & S2 heard.  Gastrointestinal system: Abdomen is nondistended, soft and nontender. No organomegaly or masses felt. Normal bowel sounds heard. Central nervous system: dementia at baseline. No focal  neurological deficits. Extremities: no cyanotic changes. Skin: No rashes, lesions or ulcers. Psychiatry: resting, unable to assess.    Data Reviewed: I have personally reviewed following labs and imaging studies  CBC: Recent Labs  Lab 07/08/23 1116 07/10/23 1719 07/11/23 0436  WBC 5.4 13.4* 11.7*  NEUTROABS 2.9 11.5*  --   HGB 11.8* 11.6* 10.6*  HCT 36.8* 36.1* 33.6*  MCV 94 93.0 94.1  PLT 242 216 165    Basic Metabolic Panel: Recent Labs  Lab 07/08/23 1116 07/10/23 1719 07/11/23 0436 07/13/23 0411  NA 136 132* 132* 132*  K 4.8 3.7 3.4* 3.4*  CL 96 96* 99 99  CO2 23 25 26 26   GLUCOSE 89 142* 106* 98  BUN 12 10 11 12   CREATININE 0.73* 0.72 0.59* 0.58*  CALCIUM  9.3 8.9 8.3* 8.4*    CBG: No results for input(s): GLUCAP in the last 168 hours.  Recent Results (from the past 240 hours)  Culture, blood (Routine x 2)     Status: None (Preliminary result)   Collection Time: 07/10/23  5:19 PM   Specimen: BLOOD  Result Value Ref Range Status   Specimen Description BLOOD BLOOD LEFT WRIST  Final   Special Requests   Final    BOTTLES DRAWN AEROBIC AND ANAEROBIC Blood Culture results may not be optimal due to an inadequate volume of blood received in culture bottles   Culture   Final    NO GROWTH 3 DAYS Performed at [facility], [address] [place], [place] [address]    Report Status PENDING  Incomplete  Culture, blood (Routine x 2)     Status: None (Preliminary result)   Collection Time: 07/10/23  5:20 PM   Specimen: BLOOD  Result Value Ref Range Status   Specimen Description BLOOD RIGHT ANTECUBITAL  Final   Special Requests   Final    BOTTLES DRAWN AEROBIC AND ANAEROBIC Blood Culture adequate volume   Culture   Final    NO GROWTH 3 DAYS Performed at [facility], [address] [place], [place] [address]    Report Status PENDING  Incomplete  Resp panel by RT-PCR (RSV, Flu A&B, Covid) Anterior Nasal Swab     Status: None   Collection Time: 07/10/23  6:20 PM    Specimen: Anterior Nasal Swab  Result Value Ref Range Status   SARS Coronavirus 2 by RT PCR NEGATIVE NEGATIVE Final    Comment: (NOTE) SARS-CoV-2 target nucleic acids are NOT DETECTED.  The SARS-CoV-2 RNA is generally detectable in upper respiratory specimens during the acute phase of infection. The lowest concentration of SARS-CoV-2 viral copies this assay can detect is 138 copies/mL. A negative result does not preclude SARS-Cov-2 infection and should not be used as the sole basis for treatment or other patient management decisions. A negative result may occur with  improper specimen collection/handling, submission of specimen other than nasopharyngeal swab, presence of viral mutation(s) within the areas targeted by this assay, and inadequate number of viral copies(<138 copies/mL). A negative result must be combined with clinical observations, patient history, and epidemiological information.  The expected result is Negative.  Fact Sheet for Patients:  BloggerCourse.com  Fact Sheet for Healthcare Providers:  SeriousBroker.it  This test is no t yet approved or cleared by the [place]  FDA and  has been authorized for detection and/or diagnosis of SARS-CoV-2 by FDA under an Emergency Use Authorization (EUA). This EUA will remain  in effect (meaning this test can be used) for the duration of the COVID-19 declaration under Section 564(b)(1) of the Act, 21 U.S.C.section 360bbb-3(b)(1), unless the authorization is terminated  or revoked sooner.       Influenza A by PCR NEGATIVE NEGATIVE Final   Influenza B by PCR NEGATIVE NEGATIVE Final    Comment: (NOTE) The Xpert Xpress SARS-CoV-2/FLU/RSV plus assay is intended as an aid in the diagnosis of influenza from Nasopharyngeal swab specimens and should not be used as a sole basis for treatment. Nasal washings and aspirates are unacceptable for Xpert Xpress  SARS-CoV-2/FLU/RSV testing.  Fact Sheet for Patients: BloggerCourse.com  Fact Sheet for Healthcare Providers: SeriousBroker.it  This test is not yet approved or cleared by the [place]  FDA and has been authorized for detection and/or diagnosis of SARS-CoV-2 by FDA under an Emergency Use Authorization (EUA). This EUA will remain in effect (meaning this test can be used) for the duration of the COVID-19 declaration under Section 564(b)(1) of the Act, 21 U.S.C. section 360bbb-3(b)(1), unless the authorization is terminated or revoked.     Resp Syncytial Virus by PCR NEGATIVE NEGATIVE Final    Comment: (NOTE) Fact Sheet for Patients: BloggerCourse.com  Fact Sheet for Healthcare Providers: SeriousBroker.it  This test is not yet approved or cleared by the [place]  FDA and has been authorized for detection and/or diagnosis of SARS-CoV-2 by FDA under an Emergency Use Authorization (EUA). This EUA will remain in effect (meaning this test can be used) for the duration of the COVID-19 declaration under Section 564(b)(1) of the Act, 21 U.S.C. section 360bbb-3(b)(1), unless the authorization is terminated or revoked.  Performed at [facility], [address] [place], [place] [address]   Urine Culture     Status: Abnormal   Collection Time: 07/10/23  6:25 PM   Specimen: Urine, Random  Result Value Ref Range Status   Specimen Description   Final    URINE, RANDOM Performed at [facility], [address], [place], [place] [address]    Special Requests   Final    NONE Reflexed from [id number] Performed at [facility], [address], [place], [place] [address]    Culture MULTIPLE SPECIES PRESENT, SUGGEST RECOLLECTION (A)  Final   Report Status 07/11/2023 FINAL  Final     Radiology Studies: No results found.   Scheduled Meds:  amLODipine   5 mg Oral Daily   vitamin C  1,000  mg Oral Daily   Chlorhexidine  Gluconate Cloth  6 each Topical Daily   cholecalciferol   2,000 Units Oral Daily   citalopram   20 mg Oral Daily   ferrous sulfate   325 mg Oral Q supper   Gerhardt's butt cream   Topical TID   mirabegron  ER  25 mg Oral Daily   pantoprazole   40 mg Oral Daily   potassium chloride   20 mEq Oral Once   propranolol   20 mg Oral Daily   rosuvastatin   10 mg Oral Daily   saccharomyces boulardii  250 mg Oral BID   warfarin  5 mg Oral ONCE-1600   Warfarin - Pharmacist Dosing Inpatient   Does not apply q1600   Continuous  Infusions:   LOS: 3 days   Time spent: 55 mins  Alasdair Kleve Vicci, MD How to contact the [facility] Attending or Consulting provider 7A - 7P or covering provider during after hours 7P -7A, for this patient?  Check the care team in [facility] and look for a) attending/consulting [facility] provider listed and b) the [facility] team listed Log into [email or web address] to find provider on call.  Locate the [facility] provider you are looking for under [facility] and page to a number that you can be directly reached. If you still have difficulty reaching the provider, please page the [facility] (Director on Call) for the Hospitalists listed on amion for assistance.  07/13/2023, 5:00 PM

## 2023-07-13 NOTE — Consult Note (Signed)
 Pharmacy Consult Note - Anticoagulation  Pharmacy Consult for warfarin Indication: atrial fibrillation  PATIENT MEASUREMENTS:      VITAL SIGNS: Temp: 98.6 F (37 C) (06/21 0419) Temp Source: Oral (06/21 0419) BP: 158/74 (06/21 0419) Pulse Rate: 71 (06/21 0419)  Recent Labs    07/11/23 0436 07/12/23 0443 07/13/23 0411  HGB 10.6*  --   --   HCT 33.6*  --   --   PLT 165  --   --   LABPROT 29.0*   < > 28.1*  INR 2.7*   < > 2.6*  CREATININE 0.59*  --  0.58*   < > = values in this interval not displayed.    Estimated Creatinine Clearance: 71.6 mL/min (A) (by C-G formula based on SCr of 0.58 mg/dL (L)).  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 male with PMH including Afib, CKD, AAA, hx GIB is presenting with UTI. Patient is on chronic anticoagulation with warfarin per chart review. Pharmacy has been consulted to manage warfarin.  Pertinent medications: Per chart, as of 07/09/2023 anticoagulation clinic note, patient's regimen is: Warfarin 2.5mg  PO once daily on Sunday Warfarin 5mg  PO all other days Last dose prior to admission: 6/17  INR trending up, on antibiotics, which may slightly increase. Will give 1/2 dose today.  Goal(s) of therapy: INR 2 - 3 Monitor platelets by anticoagulation protocol: Yes   Baseline  anticoagulation labs: Recent Labs    07/10/23 1719 07/11/23 0436 07/12/23 0443 07/13/23 0411  INR 2.6* 2.7* 2.8* 2.6*  HGB 11.6* 10.6*  --   --   PLT 216 165  --   --     Date INR Warfarin Dose 6/18 2.6 5 mg  6/19     2.7       5mg  6/20      2.8       2.5mg  6/21     2.6         5mg   PLAN: Warfarin 5mg  PO today CBC at least weekly for inpatients on warfarin Monitor for S/S of bleeding  Cherlyn Boers, BS Pharm D, BCPS Clinical Pharmacist 07/13/2023 7:41 AM

## 2023-07-13 NOTE — Plan of Care (Signed)

## 2023-07-13 NOTE — Progress Notes (Signed)
 Does not use CPAP , Refused.

## 2023-07-14 DIAGNOSIS — I4891 Unspecified atrial fibrillation: Secondary | ICD-10-CM | POA: Diagnosis not present

## 2023-07-14 DIAGNOSIS — I10 Essential (primary) hypertension: Secondary | ICD-10-CM | POA: Diagnosis not present

## 2023-07-14 DIAGNOSIS — T83511A Infection and inflammatory reaction due to indwelling urethral catheter, initial encounter: Secondary | ICD-10-CM | POA: Diagnosis not present

## 2023-07-14 DIAGNOSIS — N390 Urinary tract infection, site not specified: Secondary | ICD-10-CM | POA: Diagnosis not present

## 2023-07-14 LAB — PROTIME-INR
INR: 2.3 — ABNORMAL HIGH (ref 0.8–1.2)
Prothrombin Time: 25.9 s — ABNORMAL HIGH (ref 11.4–15.2)

## 2023-07-14 LAB — CBC
HCT: 33.5 % — ABNORMAL LOW (ref 39.0–52.0)
Hemoglobin: 10.7 g/dL — ABNORMAL LOW (ref 13.0–17.0)
MCH: 29.6 pg (ref 26.0–34.0)
MCHC: 31.9 g/dL (ref 30.0–36.0)
MCV: 92.8 fL (ref 80.0–100.0)
Platelets: 168 10*3/uL (ref 150–400)
RBC: 3.61 MIL/uL — ABNORMAL LOW (ref 4.22–5.81)
RDW: 13.9 % (ref 11.5–15.5)
WBC: 5.5 10*3/uL (ref 4.0–10.5)
nRBC: 0 % (ref 0.0–0.2)

## 2023-07-14 LAB — BASIC METABOLIC PANEL WITH GFR
Anion gap: 7 (ref 5–15)
BUN: 10 mg/dL (ref 8–23)
CO2: 27 mmol/L (ref 22–32)
Calcium: 8.5 mg/dL — ABNORMAL LOW (ref 8.9–10.3)
Chloride: 99 mmol/L (ref 98–111)
Creatinine, Ser: 0.55 mg/dL — ABNORMAL LOW (ref 0.61–1.24)
GFR, Estimated: >60 mL/min (ref >60)
Glucose, Bld: 95 mg/dL (ref 70–99)
Potassium: 3.7 mmol/L (ref 3.5–5.1)
Sodium: 133 mmol/L — ABNORMAL LOW (ref 135–145)

## 2023-07-14 LAB — MAGNESIUM: Magnesium: 1.6 mg/dL — ABNORMAL LOW (ref 1.7–2.4)

## 2023-07-14 MED ORDER — WARFARIN SODIUM 5 MG PO TABS
5.0000 mg | ORAL_TABLET | Freq: Once | ORAL | Status: AC
  Administered 2023-07-14: 5 mg via ORAL
  Filled 2023-07-14: qty 1

## 2023-07-14 MED ORDER — ZOLPIDEM TARTRATE 5 MG PO TABS
5.0000 mg | ORAL_TABLET | Freq: Every evening | ORAL | Status: DC | PRN
  Administered 2023-07-14: 5 mg via ORAL
  Filled 2023-07-14: qty 1

## 2023-07-14 MED ORDER — MAGNESIUM SULFATE 2 GM/50ML IV SOLN
2.0000 g | Freq: Once | INTRAVENOUS | Status: AC
  Administered 2023-07-14: 2 g via INTRAVENOUS
  Filled 2023-07-14: qty 50

## 2023-07-14 NOTE — Plan of Care (Signed)
   Problem: Activity: Goal: Risk for activity intolerance will decrease Outcome: Progressing   Problem: Nutrition: Goal: Adequate nutrition will be maintained Outcome: Progressing   Problem: Elimination: Goal: Will not experience complications related to bowel motility Outcome: Progressing   Problem: Pain Managment: Goal: General experience of comfort will improve and/or be controlled Outcome: Progressing   Problem: Safety: Goal: Ability to remain free from injury will improve Outcome: Progressing   Problem: Skin Integrity: Goal: Risk for impaired skin integrity will decrease Outcome: Progressing

## 2023-07-14 NOTE — Consult Note (Signed)
 Pharmacy Consult Note - Anticoagulation  Pharmacy Consult for warfarin Indication: atrial fibrillation  PATIENT MEASUREMENTS:      VITAL SIGNS: Temp: 98.5 F (36.9 C) (06/22 0537) Temp Source: Oral (06/22 0537) BP: 154/88 (06/22 0537) Pulse Rate: 82 (06/22 0537)  Recent Labs    07/14/23 0440  HGB 10.7*  HCT 33.5*  PLT 168  LABPROT 25.9*  INR 2.3*  CREATININE 0.55*    Estimated Creatinine Clearance: 71.6 mL/min (A) (by C-G formula based on SCr of 0.55 mg/dL (L)).  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 male with PMH including Afib, CKD, AAA, hx GIB is presenting with UTI. Patient is on chronic anticoagulation with warfarin per chart review. Pharmacy has been consulted to manage warfarin.  Pertinent medications: Per chart, as of 07/09/2023 anticoagulation clinic note, patient's regimen is: Warfarin 2.5mg  PO once daily on Sunday Warfarin 5mg  PO all other days Last dose prior to admission: 6/17  INR trending up, on antibiotics, which may slightly increase. Will give 1/2 dose today.  Goal(s) of therapy: INR 2 - 3 Monitor platelets by anticoagulation protocol: Yes   Baseline anticoagulation labs: Recent Labs    07/12/23 0443 07/13/23 0411 07/14/23 0440  INR 2.8* 2.6* 2.3*  HGB  --   --  10.7*  PLT  --   --  168     Date INR Warfarin Dose 6/18 2.6 5 mg  6/19     2.7       5mg  6/20      2.8       2.5mg  6/21     2.6         5mg  6/22     2.3        5mg   PLAN: Warfarin 5mg  PO today CBC at least weekly for inpatients on warfarin Monitor for S/S of bleeding  Cherlyn Boers, BS Pharm D, BCPS Clinical Pharmacist 07/14/2023 10:33 AM

## 2023-07-14 NOTE — Progress Notes (Signed)
 Patient does not use CPAP.  Patient refused.

## 2023-07-14 NOTE — Plan of Care (Signed)
   Problem: Education: Goal: Knowledge of General Education information will improve Description Including pain rating scale, medication(s)/side effects and non-pharmacologic comfort measures Outcome: Progressing   Problem: Health Behavior/Discharge Planning: Goal: Ability to manage health-related needs will improve Outcome: Progressing

## 2023-07-14 NOTE — Progress Notes (Signed)
 PROGRESS NOTE   Tristan Lopez  FMW:[id number] DOB: 1930/07/24 DOA: 07/10/2023 PCP: Bevely Doffing, FNP   Chief Complaint  Patient presents with   Weakness   Fever   Level of care: Med-Surg  Brief Admission History:  89 y.o. male with a PMH significant forHTN, history of pancolonic diverticulosis, history of reflux esophagitis and gastric ulcer, obesity and OSA, BPH with LUTS requiring chronic indwelling Foley, COPD, chronic atrial fibrillation on chronic anticoagulation with Coumadin  . - Patient was brought to ED by EMS, daughter at bedside, assists with the history, he is living home by himself, but he is having 24-hour care, patient has been with progressive weakness, unsteady gait, has been more confused his baseline, has poor appetite, so EMS were called, when they were there noted him to have fever 102.8 so he was brought to ED for further evaluation, he is with known history of chronic Foley catheter, followed by Dr. Sherrilee, exchanged every 30 days, next exchange is on Monday. - In ED he had fever 99.9, initially blood pressure soft but responded to fluids, he has been strongly positive, CT abdomen pelvis significant for known AAA, unchanged, stable benign hepatic masses, patient was started on IV antibiotics and [facility] hospitalist consulted to admit   Assessment and Plan:  Catheter associated UTI: RULED OUT  - With chronic indwelling Foley, exchanged in the emergency department. - Workup significant for leukocytosis, fever 102.8, UA is positive--> improved after initiation of antibiotics and transferred to hospital floor - treated with IV Rocephin  - urine culture was negative for UTI, DC antibiotics   chronic atrial fibrillation -Continue propanolol for rate control -Continue with warfarin, INR therapeutic, pharmacy to dose   Hyponatremia: - Mild - recheck labs in AM  - SSRI increases risk of hyponatremia at his age, if worsens would hold and defer to outpatient    Hypokalemia: - Repleted  Hypomagnesemia - IV replacement ordered    Leukocytosis: RESOLVED  - WBC 5.5 within normal limits   Normocytic anemia: -Chronic, at baseline of 10.  No evidence of bleeding   COPD - stable, no wheezing, no dyspnea, continue with as needed nebs    HTN - Amlodipine  restarted 6/20 with good blood pressure control   HLD - Continue with statin   Ambulatory dysfunction, deconditioning - Will consult PT/OT -- SNF recommended - Pt to go to SNF on 6/23 per TOC ([facility] [place] )   BPH Chronic Foley catheter -he is following with urology Dr. Sherrilee, exchanging his Foley catheter every month, next exchange is due on Monday, will exchange given his UTI   Class 1 Obesity - There is no height or weight on file to calculate BMI.   OSA - Continue with CPAP nightly   AAA - History of AAA, CT scan noted   Dementia -Continue with supportive care -Daughter very supportive and at bedside  DVT prophylaxis: warfarin Code Status: DNR  Family Communication: son/daughter at bedside  Disposition: SNF on 6/23 [facility]   Consultants:   Procedures:   Antimicrobials:    Subjective: Pt resting comfortably has been restless at times   Objective: Vitals:   07/13/23 0840 07/13/23 1315 07/13/23 1931 07/14/23 0537  BP: (!) 148/65 132/72 136/63 (!) 154/88  Pulse: 73 68 72 82  Resp:  18 18 18   Temp:  98.4 F (36.9 C) 98.5 F (36.9 C) 98.5 F (36.9 C)  TempSrc:   Oral Oral  SpO2:  99% 97% 94%    Intake/Output Summary (Last  24 hours) at 07/14/2023 1246 Last data filed at 07/14/2023 0800 Gross per 24 hour  Intake 740 ml  Output 1900 ml  Net -1160 ml   There were no vitals filed for this visit. Examination:  General exam: Appears calm and comfortable  Respiratory system: Clear to auscultation. Respiratory effort normal. Cardiovascular system: normal S1 & S2 heard.  Gastrointestinal system: Abdomen is nondistended, soft and nontender. No  organomegaly or masses felt. Normal bowel sounds heard. Central nervous system: dementia at baseline. No focal neurological deficits. Extremities: no cyanotic changes. Skin: No rashes, lesions or ulcers. Psychiatry: resting, unable to assess.    Data Reviewed: I have personally reviewed following labs and imaging studies  CBC: Recent Labs  Lab 07/08/23 1116 07/10/23 1719 07/11/23 0436 07/14/23 0440  WBC 5.4 13.4* 11.7* 5.5  NEUTROABS 2.9 11.5*  --   --   HGB 11.8* 11.6* 10.6* 10.7*  HCT 36.8* 36.1* 33.6* 33.5*  MCV 94 93.0 94.1 92.8  PLT 242 216 165 168    Basic Metabolic Panel: Recent Labs  Lab 07/08/23 1116 07/10/23 1719 07/11/23 0436 07/13/23 0411 07/14/23 0440  NA 136 132* 132* 132* 133*  K 4.8 3.7 3.4* 3.4* 3.7  CL 96 96* 99 99 99  CO2 23 25 26 26 27   GLUCOSE 89 142* 106* 98 95  BUN 12 10 11 12 10   CREATININE 0.73* 0.72 0.59* 0.58* 0.55*  CALCIUM  9.3 8.9 8.3* 8.4* 8.5*  MG  --   --   --   --  1.6*    CBG: No results for input(s): GLUCAP in the last 168 hours.  Recent Results (from the past 240 hours)  Culture, blood (Routine x 2)     Status: None (Preliminary result)   Collection Time: 07/10/23  5:19 PM   Specimen: BLOOD  Result Value Ref Range Status   Specimen Description BLOOD BLOOD LEFT WRIST  Final   Special Requests   Final    BOTTLES DRAWN AEROBIC AND ANAEROBIC Blood Culture results may not be optimal due to an inadequate volume of blood received in culture bottles   Culture   Final    NO GROWTH 4 DAYS Performed at [facility], [address] [place], [place] [address]    Report Status PENDING  Incomplete  Culture, blood (Routine x 2)     Status: None (Preliminary result)   Collection Time: 07/10/23  5:20 PM   Specimen: BLOOD  Result Value Ref Range Status   Specimen Description BLOOD RIGHT ANTECUBITAL  Final   Special Requests   Final    BOTTLES DRAWN AEROBIC AND ANAEROBIC Blood Culture adequate volume   Culture   Final    NO GROWTH 4  DAYS Performed at [facility], [address], [place], [place] [address]    Report Status PENDING  Incomplete  Resp panel by RT-PCR (RSV, Flu A&B, Covid) Anterior Nasal Swab     Status: None   Collection Time: 07/10/23  6:20 PM   Specimen: Anterior Nasal Swab  Result Value Ref Range Status   SARS Coronavirus 2 by RT PCR NEGATIVE NEGATIVE Final    Comment: (NOTE) SARS-CoV-2 target nucleic acids are NOT DETECTED.  The SARS-CoV-2 RNA is generally detectable in upper respiratory specimens during the acute phase of infection. The lowest concentration of SARS-CoV-2 viral copies this assay can detect is 138 copies/mL. A negative result does not preclude SARS-Cov-2 infection and should not be used as the sole basis for treatment or other patient  management decisions. A negative result may occur with  improper specimen collection/handling, submission of specimen other than nasopharyngeal swab, presence of viral mutation(s) within the areas targeted by this assay, and inadequate number of viral copies(<138 copies/mL). A negative result must be combined with clinical observations, patient history, and epidemiological information. The expected result is Negative.  Fact Sheet for Patients:  BloggerCourse.com  Fact Sheet for Healthcare Providers:  SeriousBroker.it  This test is no t yet approved or cleared by the [place]  FDA and  has been authorized for detection and/or diagnosis of SARS-CoV-2 by FDA under an Emergency Use Authorization (EUA). This EUA will remain  in effect (meaning this test can be used) for the duration of the COVID-19 declaration under Section 564(b)(1) of the Act, 21 U.S.C.section 360bbb-3(b)(1), unless the authorization is terminated  or revoked sooner.       Influenza A by PCR NEGATIVE NEGATIVE Final   Influenza B by PCR NEGATIVE NEGATIVE Final    Comment: (NOTE) The Xpert Xpress SARS-CoV-2/FLU/RSV plus  assay is intended as an aid in the diagnosis of influenza from Nasopharyngeal swab specimens and should not be used as a sole basis for treatment. Nasal washings and aspirates are unacceptable for Xpert Xpress SARS-CoV-2/FLU/RSV testing.  Fact Sheet for Patients: BloggerCourse.com  Fact Sheet for Healthcare Providers: SeriousBroker.it  This test is not yet approved or cleared by the [place]  FDA and has been authorized for detection and/or diagnosis of SARS-CoV-2 by FDA under an Emergency Use Authorization (EUA). This EUA will remain in effect (meaning this test can be used) for the duration of the COVID-19 declaration under Section 564(b)(1) of the Act, 21 U.S.C. section 360bbb-3(b)(1), unless the authorization is terminated or revoked.     Resp Syncytial Virus by PCR NEGATIVE NEGATIVE Final    Comment: (NOTE) Fact Sheet for Patients: BloggerCourse.com  Fact Sheet for Healthcare Providers: SeriousBroker.it  This test is not yet approved or cleared by the [place]  FDA and has been authorized for detection and/or diagnosis of SARS-CoV-2 by FDA under an Emergency Use Authorization (EUA). This EUA will remain in effect (meaning this test can be used) for the duration of the COVID-19 declaration under Section 564(b)(1) of the Act, 21 U.S.C. section 360bbb-3(b)(1), unless the authorization is terminated or revoked.  Performed at [facility], [address] [place], [place] [address]   Urine Culture     Status: Abnormal   Collection Time: 07/10/23  6:25 PM   Specimen: Urine, Random  Result Value Ref Range Status   Specimen Description   Final    URINE, RANDOM Performed at [facility], [address], [place], [place] [address]    Special Requests   Final    NONE Reflexed from [id number] Performed at [facility], [address], [place], [place] [address]    Culture  MULTIPLE SPECIES PRESENT, SUGGEST RECOLLECTION (A)  Final   Report Status 07/11/2023 FINAL  Final     Radiology Studies: No results found.   Scheduled Meds:  amLODipine   5 mg Oral Daily   vitamin C  1,000 mg Oral Daily   Chlorhexidine  Gluconate Cloth  6 each Topical Daily   cholecalciferol   2,000 Units Oral Daily   citalopram   20 mg Oral Daily   ferrous sulfate   325 mg Oral Q supper   Gerhardt's butt cream   Topical TID   mirabegron  ER  25 mg Oral Daily   pantoprazole   40 mg Oral Daily   propranolol   20 mg Oral  Daily   rosuvastatin   10 mg Oral Daily   saccharomyces boulardii  250 mg Oral BID   warfarin  5 mg Oral ONCE-1600   Warfarin - Pharmacist Dosing Inpatient   Does not apply q1600   Continuous Infusions:  magnesium sulfate bolus IVPB 2 g (07/14/23 1157)    LOS: 4 days   Time spent: 55 mins  Lesieli Bresee Vicci, MD How to contact the [facility] Attending or Consulting provider 7A - 7P or covering provider during after hours 7P -7A, for this patient?  Check the care team in [facility] and look for a) attending/consulting [facility] provider listed and b) the [facility] team listed Log into [email or web address] to find provider on call.  Locate the [facility] provider you are looking for under [facility] and page to a number that you can be directly reached. If you still have difficulty reaching the provider, please page the [facility] (Director on Call) for the Hospitalists listed on amion for assistance.  07/14/2023, 12:46 PM

## 2023-07-15 ENCOUNTER — Ambulatory Visit

## 2023-07-15 ENCOUNTER — Other Ambulatory Visit: Payer: Self-pay | Admitting: *Deleted

## 2023-07-15 DIAGNOSIS — K219 Gastro-esophageal reflux disease without esophagitis: Secondary | ICD-10-CM | POA: Diagnosis not present

## 2023-07-15 DIAGNOSIS — Z5181 Encounter for therapeutic drug level monitoring: Secondary | ICD-10-CM

## 2023-07-15 DIAGNOSIS — T83511A Infection and inflammatory reaction due to indwelling urethral catheter, initial encounter: Secondary | ICD-10-CM | POA: Diagnosis not present

## 2023-07-15 DIAGNOSIS — Z7401 Bed confinement status: Secondary | ICD-10-CM | POA: Diagnosis not present

## 2023-07-15 DIAGNOSIS — F341 Dysthymic disorder: Secondary | ICD-10-CM | POA: Diagnosis not present

## 2023-07-15 DIAGNOSIS — R2681 Unsteadiness on feet: Secondary | ICD-10-CM | POA: Diagnosis not present

## 2023-07-15 DIAGNOSIS — R531 Weakness: Secondary | ICD-10-CM | POA: Diagnosis not present

## 2023-07-15 DIAGNOSIS — N401 Enlarged prostate with lower urinary tract symptoms: Secondary | ICD-10-CM | POA: Diagnosis not present

## 2023-07-15 DIAGNOSIS — I119 Hypertensive heart disease without heart failure: Secondary | ICD-10-CM | POA: Diagnosis not present

## 2023-07-15 DIAGNOSIS — I482 Chronic atrial fibrillation, unspecified: Secondary | ICD-10-CM | POA: Diagnosis not present

## 2023-07-15 DIAGNOSIS — I4891 Unspecified atrial fibrillation: Secondary | ICD-10-CM | POA: Diagnosis not present

## 2023-07-15 DIAGNOSIS — G4730 Sleep apnea, unspecified: Secondary | ICD-10-CM | POA: Diagnosis not present

## 2023-07-15 DIAGNOSIS — I10 Essential (primary) hypertension: Secondary | ICD-10-CM | POA: Diagnosis not present

## 2023-07-15 DIAGNOSIS — G4733 Obstructive sleep apnea (adult) (pediatric): Secondary | ICD-10-CM | POA: Diagnosis not present

## 2023-07-15 DIAGNOSIS — E785 Hyperlipidemia, unspecified: Secondary | ICD-10-CM | POA: Diagnosis not present

## 2023-07-15 DIAGNOSIS — J449 Chronic obstructive pulmonary disease, unspecified: Secondary | ICD-10-CM | POA: Diagnosis not present

## 2023-07-15 DIAGNOSIS — N3281 Overactive bladder: Secondary | ICD-10-CM | POA: Diagnosis not present

## 2023-07-15 DIAGNOSIS — N390 Urinary tract infection, site not specified: Secondary | ICD-10-CM | POA: Diagnosis not present

## 2023-07-15 DIAGNOSIS — R5381 Other malaise: Secondary | ICD-10-CM | POA: Diagnosis not present

## 2023-07-15 DIAGNOSIS — F0390 Unspecified dementia without behavioral disturbance: Secondary | ICD-10-CM | POA: Diagnosis not present

## 2023-07-15 DIAGNOSIS — I714 Abdominal aortic aneurysm, without rupture, unspecified: Secondary | ICD-10-CM | POA: Diagnosis not present

## 2023-07-15 DIAGNOSIS — N182 Chronic kidney disease, stage 2 (mild): Secondary | ICD-10-CM | POA: Diagnosis not present

## 2023-07-15 LAB — PROTIME-INR
INR: 2.3 — ABNORMAL HIGH (ref 0.8–1.2)
Prothrombin Time: 25.8 s — ABNORMAL HIGH (ref 11.4–15.2)

## 2023-07-15 LAB — CULTURE, BLOOD (ROUTINE X 2)
Culture: NO GROWTH
Culture: NO GROWTH
Special Requests: ADEQUATE

## 2023-07-15 MED ORDER — BISACODYL 5 MG PO TBEC: 5.0000 mg | DELAYED_RELEASE_TABLET | Freq: Every day | ORAL | Status: AC | PRN

## 2023-07-15 MED ORDER — FUROSEMIDE 20 MG PO TABS: 20.0000 mg | ORAL_TABLET | Freq: Every day | ORAL | Status: AC | PRN

## 2023-07-15 MED ORDER — WARFARIN SODIUM 5 MG PO TABS: ORAL_TABLET | ORAL | Status: AC

## 2023-07-15 MED ORDER — BENAZEPRIL HCL 5 MG PO TABS
5.0000 mg | ORAL_TABLET | Freq: Every day | ORAL | Status: AC
Start: 2023-07-15 — End: ?

## 2023-07-15 MED ORDER — WARFARIN SODIUM 5 MG PO TABS: 5.0000 mg | ORAL_TABLET | Freq: Once | ORAL | Status: DC

## 2023-07-15 NOTE — Telephone Encounter (Signed)
 Lab orders for pt mailed to son Jenaro Souder.

## 2023-07-15 NOTE — Care Management Important Message (Signed)
 Important Message  Patient Details  Name: Tristan Lopez MRN: [id number] Date of Birth: February 06, 1930   Important Message Given:  Yes - Medicare IM     Macie Baum L Ellenore Roscoe 07/15/2023, 10:14 AM

## 2023-07-15 NOTE — Consult Note (Signed)
 Pharmacy Consult Note - Anticoagulation  Pharmacy Consult for warfarin Indication: atrial fibrillation  PATIENT MEASUREMENTS:      VITAL SIGNS: Temp: 99 F (37.2 C) (06/23 0408) Temp Source: Oral (06/23 0408) BP: 130/86 (06/23 0408) Pulse Rate: 70 (06/23 0408)  Recent Labs    07/14/23 0440 07/15/23 0331  HGB 10.7*  --   HCT 33.5*  --   PLT 168  --   LABPROT 25.9* 25.8*  INR 2.3* 2.3*  CREATININE 0.55*  --     Estimated Creatinine Clearance: 71.6 mL/min (A) (by C-G formula based on SCr of 0.55 mg/dL (L)).  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 male with PMH including Afib, CKD, AAA, hx GIB is presenting with UTI. Patient is on chronic anticoagulation with warfarin per chart review. Pharmacy has been consulted to manage warfarin.  Pertinent medications: Per chart, as of 07/09/2023 anticoagulation clinic note, patient's regimen is: Warfarin 2.5mg  PO once daily on Sunday Warfarin 5mg  PO all other days Last dose prior to admission: 6/17  INR trending up, on antibiotics, which may slightly increase. Will give 1/2 dose today.  Goal(s) of therapy: INR 2 - 3 Monitor platelets by anticoagulation protocol: Yes   Baseline anticoagulation labs: Recent Labs    07/13/23 0411 07/14/23 0440 07/15/23 0331  INR 2.6* 2.3* 2.3*   HGB  --  10.7*  --   PLT  --  168  --     Date INR Warfarin Dose 6/18 2.6 5 mg  6/19     2.7       5mg  6/20      2.8       2.5mg  6/21     2.6         5mg  6/22     2.3        5mg  6/23     2.3         5MG   PLAN: Warfarin 5mg  PO today CBC at least weekly for inpatients on warfarin Monitor for S/S of bleeding  Cherlyn Boers, BS Pharm D, BCPS Clinical Pharmacist 07/15/2023 7:44 AM

## 2023-07-15 NOTE — Discharge Summary (Addendum)
 Physician Discharge Summary  Tristan Lopez FMW:[id number] DOB: Jan 14, 1931 DOA: 07/10/2023  PCP: Bevely Doffing, FNP  Admit date: 07/10/2023 Discharge date: 07/15/2023  Disposition:  SNF [facility] in [place]   Recommendations for Outpatient Follow-up:  Follow up with PCP in 2 weeks Please obtain BMP/CBC in 1 week Please monitor PT/INR at least once weekly  Delirium precautions recommended   Home Health:  SNF   Discharge Condition: STABLE   CODE STATUS: DNR DIET:  Dysphagia 3, assist with feeding, aspiration precautions   Brief Hospitalization Summary: Please see all hospital notes, images, labs for full details of the hospitalization. Admission provider HPI:  89 y.o. male with a PMH significant forHTN, history of pancolonic diverticulosis, history of reflux esophagitis and gastric ulcer, obesity and OSA, BPH with LUTS requiring chronic indwelling Foley, COPD, chronic atrial fibrillation on chronic anticoagulation with Coumadin  . - Patient was brought to ED by EMS, daughter at bedside, assists with the history, he is living home by himself, but he is having 24-hour care, patient has been with progressive weakness, unsteady gait, has been more confused his baseline, has poor appetite, so EMS were called, when they were there noted him to have fever 102.8 so he was brought to ED for further evaluation, he is with known history of chronic Foley catheter, followed by Dr. Sherrilee, exchanged every 30 days, next exchange is on Monday. - In ED he had fever 99.9, initially blood pressure soft but responded to fluids, he has been strongly positive, CT abdomen pelvis significant for known AAA, unchanged, stable benign hepatic masses, patient was started on IV antibiotics and [facility] hospitalist consulted to admit  Hospital Course by listed problems addressed   Catheter associated UTI: RULED OUT  - With chronic indwelling Foley, exchanged in the emergency department. - Workup significant for  leukocytosis, fever 102.8, UA is positive--> improved after initiation of antibiotics and transferred to hospital floor - treated with IV Rocephin  - urine culture was negative for UTI, DC antibiotics   chronic atrial fibrillation -Continue propanolol for rate control -Continue with warfarin, INR therapeutic, pharmacy to dose   Hyponatremia: - Mild (133)  - recheck labs outpatient   - SSRI increases risk of hyponatremia at his age, if worsens would hold and defer to outpatient   Hypokalemia: - Repleted   Hypomagnesemia - IV replacement given and repleted   Leukocytosis: RESOLVED  - WBC 5.5 within normal limits   Normocytic anemia: -Chronic, at baseline of 10.  No evidence of bleeding   COPD - stable, no wheezing, no dyspnea, continue with as needed nebs    HTN - Amlodipine  restarted 6/20 with good blood pressure control   HLD - Continue with statin   Ambulatory dysfunction, deconditioning - Will consult PT/OT -- SNF recommended - Pt to go to SNF on 6/23 per TOC ([facility] in [place] )   BPH Chronic Foley catheter -he is following with urology Dr. Sherrilee, exchanging his Foley catheter every month, next exchange is due on Monday, will exchange given his UTI   Class 1 Obesity - There is no height or weight on file to calculate BMI.   OSA - Continue with CPAP nightly   AAA - History of AAA, CT scan noted   Dementia -Continue with supportive care -Son/Daughter very supportive and at bedside  DNR present on admission   Discharge Diagnoses:  Principal Problem:   UTI (urinary tract infection) Active Problems:   GERD (gastroesophageal reflux disease)   Atrial fibrillation (HCC)   Essential  hypertension   AAA (abdominal aortic aneurysm) without rupture (HCC)   Hyperlipidemia   COPD (chronic obstructive pulmonary disease) (HCC)   Benign prostatic hyperplasia   Chronic kidney disease, stage 2 (mild)   OSA on CPAP   Class 1 obesity due to excess calories  with serious comorbidity and body mass index (BMI) of 33.0 to 33.9 in adult   Impaired ambulation   Unsteady gait when walking   Discharge Instructions:  Allergies as of 07/15/2023       Reactions   Latex Dermatitis, Rash        Medication List     TAKE these medications    acetaminophen  325 MG tablet Commonly known as: TYLENOL  Take 325-650 mg by mouth every 6 (six) hours as needed for headache or mild pain (pain score 1-3).   amLODipine  5 MG tablet Commonly known as: NORVASC  TAKE 1 TABLET (5 MG TOTAL) BY MOUTH DAILY.   benazepril  5 MG tablet Commonly known as: LOTENSIN  Take 1 tablet (5 mg total) by mouth daily. What changed:  medication strength how much to take   bisacodyl  5 MG EC tablet Commonly known as: DULCOLAX Take 1 tablet (5 mg total) by mouth daily as needed for moderate constipation.   citalopram  20 MG tablet Commonly known as: CELEXA  Take 1 tablet (20 mg total) by mouth daily.   DIGESTIVE ADVANTAGE PO Take 1 capsule by mouth 2 (two) times daily.   dimethicone 1 % cream Apply 1 Application topically 2 (two) times daily as needed. What changed: reasons to take this   FeroSul 325 (65 Fe) MG tablet Generic drug: ferrous sulfate  TAKE ONE TABLET BY MOUTH EVERY DAY   furosemide  20 MG tablet Commonly known as: LASIX  Take 1 tablet (20 mg total) by mouth daily as needed for fluid or edema. TAKE ONE TABLET BY MOUTH DAILY AS NEEDED FOR LEG SWELLING What changed: See the new instructions.   Garlic 1000 MG Caps Take 1,000 mg by mouth daily.   mirabegron  ER 25 MG Tb24 tablet Commonly known as: MYRBETRIQ  Take 1 tablet (25 mg total) by mouth daily.   pantoprazole  40 MG tablet Commonly known as: PROTONIX  TAKE ONE TABLET BY MOUTH EVERY DAY   potassium citrate  10 MEQ (1080 MG) SR tablet Commonly known as: UROCIT-K  TAKE ONE TABLET BY MOUTH EVERY DAY   propranolol  20 MG tablet Commonly known as: INDERAL  TAKE 1 TABLET (20 MG TOTAL) BY MOUTH DAILY.    rosuvastatin  10 MG tablet Commonly known as: CRESTOR  TAKE 1 TABLET (10 MG TOTAL) BY MOUTH DAILY.   tadalafil  5 MG tablet Commonly known as: CIALIS  TAKE ONE TABLET BY MOUTH EVERY DAY   UNABLE TO FIND 1 each by Does not apply route daily. Med Name: LIFT CHAIR   vitamin C 1000 MG tablet Take 1,000 mg by mouth daily.   Vitamin D -3 25 MCG (1000 UT) Caps Take 2,000 Units by mouth daily.   warfarin 5 MG tablet Commonly known as: COUMADIN  Take as directed. If you are unsure how to take this medication, talk to your nurse or doctor. Original instructions: Take 2.5 mg on Sunday only, take 5 mg daily for all other days What changed:  how much to take how to take this when to take this additional instructions        Follow-up Information     Bevely Doffing, FNP. Schedule an appointment as soon as possible for a visit in 2 week(s).   Specialty: Family Medicine Why: Hospital Follow Up Contact  information: [address] [place] [address] [phone]                Allergies  Allergen Reactions   Latex Dermatitis and Rash   Allergies as of 07/15/2023       Reactions   Latex Dermatitis, Rash        Medication List     TAKE these medications    acetaminophen  325 MG tablet Commonly known as: TYLENOL  Take 325-650 mg by mouth every 6 (six) hours as needed for headache or mild pain (pain score 1-3).   amLODipine  5 MG tablet Commonly known as: NORVASC  TAKE 1 TABLET (5 MG TOTAL) BY MOUTH DAILY.   benazepril  5 MG tablet Commonly known as: LOTENSIN  Take 1 tablet (5 mg total) by mouth daily. What changed:  medication strength how much to take   bisacodyl  5 MG EC tablet Commonly known as: DULCOLAX Take 1 tablet (5 mg total) by mouth daily as needed for moderate constipation.   citalopram  20 MG tablet Commonly known as: CELEXA  Take 1 tablet (20 mg total) by mouth daily.   DIGESTIVE ADVANTAGE PO Take 1 capsule by mouth 2 (two) times  daily.   dimethicone 1 % cream Apply 1 Application topically 2 (two) times daily as needed. What changed: reasons to take this   FeroSul 325 (65 Fe) MG tablet Generic drug: ferrous sulfate  TAKE ONE TABLET BY MOUTH EVERY DAY   furosemide  20 MG tablet Commonly known as: LASIX  Take 1 tablet (20 mg total) by mouth daily as needed for fluid or edema. TAKE ONE TABLET BY MOUTH DAILY AS NEEDED FOR LEG SWELLING What changed: See the new instructions.   Garlic 1000 MG Caps Take 1,000 mg by mouth daily.   mirabegron  ER 25 MG Tb24 tablet Commonly known as: MYRBETRIQ  Take 1 tablet (25 mg total) by mouth daily.   pantoprazole  40 MG tablet Commonly known as: PROTONIX  TAKE ONE TABLET BY MOUTH EVERY DAY   potassium citrate  10 MEQ (1080 MG) SR tablet Commonly known as: UROCIT-K  TAKE ONE TABLET BY MOUTH EVERY DAY   propranolol  20 MG tablet Commonly known as: INDERAL  TAKE 1 TABLET (20 MG TOTAL) BY MOUTH DAILY.   rosuvastatin  10 MG tablet Commonly known as: CRESTOR  TAKE 1 TABLET (10 MG TOTAL) BY MOUTH DAILY.   tadalafil  5 MG tablet Commonly known as: CIALIS  TAKE ONE TABLET BY MOUTH EVERY DAY   UNABLE TO FIND 1 each by Does not apply route daily. Med Name: LIFT CHAIR   vitamin C 1000 MG tablet Take 1,000 mg by mouth daily.   Vitamin D -3 25 MCG (1000 UT) Caps Take 2,000 Units by mouth daily.   warfarin 5 MG tablet Commonly known as: COUMADIN  Take as directed. If you are unsure how to take this medication, talk to your nurse or doctor. Original instructions: Take 2.5 mg on Sunday only, take 5 mg daily for all other days What changed:  how much to take how to take this when to take this additional instructions        Procedures/Studies: CT ABDOMEN PELVIS W CONTRAST Result Date: 07/10/2023 EXAM: CT ABDOMEN AND PELVIS WITH CONTRAST 07/10/2023 07:18:52 PM TECHNIQUE: CT of the abdomen and pelvis was performed with the administration of intravenous contrast. Multiplanar  reformatted images are provided for review. Automated exposure control, iterative reconstruction, and/or weight based adjustment of the mA/kV was utilized to reduce the radiation dose to as low as reasonably achievable. COMPARISON: 01/06/2023 CLINICAL HISTORY: Abdominal  pain, acute, nonlocalized. Pt bib ems for fever of 102.8 and weakness that started today. Pt normally able to walk to bathroom and has done so earlier today but, is now experiencing weakness since 3 pm. Pt disoriented to time and place. Pt had a fall 3 weeks ago, bruised noted on abdominal area. Given 325 tylenol  pta. FINDINGS: LOWER CHEST: Small hiatal hernia. LIVER: Chronic rim calcified heterogeneous masses along the right hepatic dome and lateral segment left hepatic lobe, measuring 6.8 and 7.2 cm respectively, benign. GALLBLADDER AND BILE DUCTS: Gallbladder is unremarkable. No biliary ductal dilatation. SPLEEN: No acute abnormality. PANCREAS: No acute abnormality. ADRENAL GLANDS: No acute abnormality. KIDNEYS, URETERS AND BLADDER: Bilateral simple renal cysts, measuring up to 7.4 cm in the posterior right upper kidney, benign (Bosniak 1). No follow-up is recommended. Two nonobstructing left lower pole renal calculi measuring up to 5 mm. No hydronephrosis. No perinephric or periureteral stranding. Urinary bladder decompressed by indwelling foley catheter. GI AND BOWEL: Left colonic diverticulosis, without evidence of diverticulitis. The appendix is not discretely visualized. No bowel obstruction. No bowel wall thickening. PERITONEUM AND RETROPERITONEUM: No ascites. No free air. VASCULATURE: 4.2 cm infrarenal abdominal aortic aneurysm, unchanged. Atherosclerotic calcifications of the abdominal aorta and branch vessels. LYMPH NODES: No lymphadenopathy. REPRODUCTIVE ORGANS: Prostatomegaly, with enlargement of the central gland indenting the base of the bladder, suggesting BPH. BONES AND SOFT TISSUES: Degenerative changes of the visualized  thoracolumbar spine. No acute osseous abnormality. No focal soft tissue abnormality. IMPRESSION: 1. No acute findings in the abdomen or pelvis. 2. 4.2 cm infrarenal abdominal aortic aneurysm, unchanged. Given age, annual follow-up is considered optional. 3. Stable hepatic masses, benign. 4. Additional ancillary findings as above. Electronically signed by: Pinkie Pebbles MD 07/10/2023 07:30 PM EDT RP Workstation: [id number]   DG Chest Port 1 View Result Date: 07/10/2023 CLINICAL DATA:  Sepsis.  Fever and weakness. EXAM: PORTABLE CHEST 1 VIEW COMPARISON:  09/01/2022, 08/24/2021. FINDINGS: The heart is enlarged the mediastinal contour is within normal limits. There is atherosclerotic calcification of the aorta. No consolidation, effusion, or pneumothorax is seen. Previously described left upper lobe pulmonary nodule on prior CT is not seen radiographically. No acute osseous abnormality. IMPRESSION: No active disease. Electronically Signed   By: Leita Birmingham M.D.   On: 07/10/2023 18:18     Subjective: Pt finally slept after he received the ambien last night  Discharge Exam: Vitals:   07/14/23 1936 07/15/23 0408  BP: (!) 172/98 130/86  Pulse: 86 70  Resp: 20 20  Temp: 99.3 F (37.4 C) 99 F (37.2 C)  SpO2: 96% 95%   Vitals:   07/14/23 0537 07/14/23 1308 07/14/23 1936 07/15/23 0408  BP: (!) 154/88 (!) 143/79 (!) 172/98 130/86  Pulse: 82 66 86 70  Resp: 18 17 20 20   Temp: 98.5 F (36.9 C) 98.3 F (36.8 C) 99.3 F (37.4 C) 99 F (37.2 C)  TempSrc: Oral  Oral Oral  SpO2: 94% 99% 96% 95%   General: Pt is sleeping not in acute distress Cardiovascular:  normal S1/S2 +, no rubs, no gallops Respiratory: CTA bilaterally, no wheezing, no rhonchi Abdominal: Soft, NT, ND, bowel sounds + Extremities: no edema, no cyanosis   The results of significant diagnostics from this hospitalization (including imaging, microbiology, ancillary and laboratory) are listed below for reference.      Microbiology: Recent Results (from the past 240 hours)  Culture, blood (Routine x 2)     Status: None (Preliminary result)   Collection Time: 07/10/23  5:19 PM   Specimen: BLOOD  Result Value Ref Range Status   Specimen Description BLOOD BLOOD LEFT WRIST  Final   Special Requests   Final    BOTTLES DRAWN AEROBIC AND ANAEROBIC Blood Culture results may not be optimal due to an inadequate volume of blood received in culture bottles   Culture   Final    NO GROWTH 4 DAYS Performed at [facility], [address], [place], [place] [address]    Report Status PENDING  Incomplete  Culture, blood (Routine x 2)     Status: None (Preliminary result)   Collection Time: 07/10/23  5:20 PM   Specimen: BLOOD  Result Value Ref Range Status   Specimen Description BLOOD RIGHT ANTECUBITAL  Final   Special Requests   Final    BOTTLES DRAWN AEROBIC AND ANAEROBIC Blood Culture adequate volume   Culture   Final    NO GROWTH 4 DAYS Performed at [facility], [address], [place], [place] [address]    Report Status PENDING  Incomplete  Resp panel by RT-PCR (RSV, Flu A&B, Covid) Anterior Nasal Swab     Status: None   Collection Time: 07/10/23  6:20 PM   Specimen: Anterior Nasal Swab  Result Value Ref Range Status   SARS Coronavirus 2 by RT PCR NEGATIVE NEGATIVE Final    Comment: (NOTE) SARS-CoV-2 target nucleic acids are NOT DETECTED.  The SARS-CoV-2 RNA is generally detectable in upper respiratory specimens during the acute phase of infection. The lowest concentration of SARS-CoV-2 viral copies this assay can detect is 138 copies/mL. A negative result does not preclude SARS-Cov-2 infection and should not be used as the sole basis for treatment or other patient management decisions. A negative result may occur with  improper specimen collection/handling, submission of specimen other than nasopharyngeal swab, presence of viral mutation(s) within the areas targeted by this assay, and inadequate  number of viral copies(<138 copies/mL). A negative result must be combined with clinical observations, patient history, and epidemiological information. The expected result is Negative.  Fact Sheet for Patients:  BloggerCourse.com  Fact Sheet for Healthcare Providers:  SeriousBroker.it  This test is no t yet approved or cleared by the [place]  FDA and  has been authorized for detection and/or diagnosis of SARS-CoV-2 by FDA under an Emergency Use Authorization (EUA). This EUA will remain  in effect (meaning this test can be used) for the duration of the COVID-19 declaration under Section 564(b)(1) of the Act, 21 U.S.C.section 360bbb-3(b)(1), unless the authorization is terminated  or revoked sooner.       Influenza A by PCR NEGATIVE NEGATIVE Final   Influenza B by PCR NEGATIVE NEGATIVE Final    Comment: (NOTE) The Xpert Xpress SARS-CoV-2/FLU/RSV plus assay is intended as an aid in the diagnosis of influenza from Nasopharyngeal swab specimens and should not be used as a sole basis for treatment. Nasal washings and aspirates are unacceptable for Xpert Xpress SARS-CoV-2/FLU/RSV testing.  Fact Sheet for Patients: BloggerCourse.com  Fact Sheet for Healthcare Providers: SeriousBroker.it  This test is not yet approved or cleared by the [place]  FDA and has been authorized for detection and/or diagnosis of SARS-CoV-2 by FDA under an Emergency Use Authorization (EUA). This EUA will remain in effect (meaning this test can be used) for the duration of the COVID-19 declaration under Section 564(b)(1) of the Act, 21 U.S.C. section 360bbb-3(b)(1), unless the authorization is terminated or revoked.     Resp Syncytial Virus by PCR NEGATIVE NEGATIVE Final  Comment: (NOTE) Fact Sheet for Patients: BloggerCourse.com  Fact Sheet for Healthcare  Providers: SeriousBroker.it  This test is not yet approved or cleared by the [place]  FDA and has been authorized for detection and/or diagnosis of SARS-CoV-2 by FDA under an Emergency Use Authorization (EUA). This EUA will remain in effect (meaning this test can be used) for the duration of the COVID-19 declaration under Section 564(b)(1) of the Act, 21 U.S.C. section 360bbb-3(b)(1), unless the authorization is terminated or revoked.  Performed at [facility], [address], [place], [place] [address]   Urine Culture     Status: Abnormal   Collection Time: 07/10/23  6:25 PM   Specimen: Urine, Random  Result Value Ref Range Status   Specimen Description   Final    URINE, RANDOM Performed at [facility], [address], [place], [place] [address]    Special Requests   Final    NONE Reflexed from [id number] Performed at [facility], [address] [place], [place] [address]    Culture MULTIPLE SPECIES PRESENT, SUGGEST RECOLLECTION (A)  Final   Report Status 07/11/2023 FINAL  Final     Labs: BNP (last 3 results) No results for input(s): BNP in the last 8760 hours. Basic Metabolic Panel: Recent Labs  Lab 07/08/23 1116 07/10/23 1719 07/11/23 0436 07/13/23 0411 07/14/23 0440  NA 136 132* 132* 132* 133*  K 4.8 3.7 3.4* 3.4* 3.7  CL 96 96* 99 99 99  CO2 23 25 26 26 27   GLUCOSE 89 142* 106* 98 95  BUN 12 10 11 12 10   CREATININE 0.73* 0.72 0.59* 0.58* 0.55*  CALCIUM  9.3 8.9 8.3* 8.4* 8.5*  MG  --   --   --   --  1.6*   Liver Function Tests: Recent Labs  Lab 07/10/23 1719  AST 20  ALT 15  ALKPHOS 71  BILITOT 1.0  PROT 7.0  ALBUMIN 3.3*   No results for input(s): LIPASE, AMYLASE in the last 168 hours. No results for input(s): AMMONIA in the last 168 hours. CBC: Recent Labs  Lab 07/08/23 1116 07/10/23 1719 07/11/23 0436 07/14/23 0440  WBC 5.4 13.4* 11.7* 5.5  NEUTROABS 2.9 11.5*  --   --   HGB 11.8* 11.6* 10.6* 10.7*  HCT  36.8* 36.1* 33.6* 33.5*  MCV 94 93.0 94.1 92.8  PLT 242 216 165 168   Cardiac Enzymes: No results for input(s): CKTOTAL, CKMB, CKMBINDEX, TROPONINI in the last 168 hours. BNP: Invalid input(s): POCBNP CBG: No results for input(s): GLUCAP in the last 168 hours. D-Dimer No results for input(s): DDIMER in the last 72 hours. Hgb A1c No results for input(s): HGBA1C in the last 72 hours. Lipid Profile No results for input(s): CHOL, HDL, LDLCALC, TRIG, CHOLHDL, LDLDIRECT in the last 72 hours. Thyroid function studies No results for input(s): TSH, T4TOTAL, T3FREE, THYROIDAB in the last 72 hours.  Invalid input(s): FREET3 Anemia work up No results for input(s): VITAMINB12, FOLATE, FERRITIN, TIBC, IRON , RETICCTPCT in the last 72 hours. Urinalysis    Component Value Date/Time   COLORURINE AMBER (A) 07/10/2023 1825   APPEARANCEUR CLOUDY (A) 07/10/2023 1825   APPEARANCEUR Cloudy (A) 05/10/2021 1001   LABSPEC 1.023 07/10/2023 1825   PHURINE 7.0 07/10/2023 1825   GLUCOSEU NEGATIVE 07/10/2023 1825   HGBUR NEGATIVE 07/10/2023 1825   BILIRUBINUR NEGATIVE 07/10/2023 1825   BILIRUBINUR Negative 05/10/2021 1001   KETONESUR 5 (A) 07/10/2023 1825   PROTEINUR 100 (A) 07/10/2023 1825   UROBILINOGEN 0.2 10/04/2011 2218  NITRITE NEGATIVE 07/10/2023 1825   LEUKOCYTESUR TRACE (A) 07/10/2023 1825   Sepsis Labs Recent Labs  Lab 07/08/23 1116 07/10/23 1719 07/11/23 0436 07/14/23 0440  WBC 5.4 13.4* 11.7* 5.5   Microbiology Recent Results (from the past 240 hours)  Culture, blood (Routine x 2)     Status: None (Preliminary result)   Collection Time: 07/10/23  5:19 PM   Specimen: BLOOD  Result Value Ref Range Status   Specimen Description BLOOD BLOOD LEFT WRIST  Final   Special Requests   Final    BOTTLES DRAWN AEROBIC AND ANAEROBIC Blood Culture results may not be optimal due to an inadequate volume of blood received in culture bottles    Culture   Final    NO GROWTH 4 DAYS Performed at [facility], [address], [place], [place] [address]    Report Status PENDING  Incomplete  Culture, blood (Routine x 2)     Status: None (Preliminary result)   Collection Time: 07/10/23  5:20 PM   Specimen: BLOOD  Result Value Ref Range Status   Specimen Description BLOOD RIGHT ANTECUBITAL  Final   Special Requests   Final    BOTTLES DRAWN AEROBIC AND ANAEROBIC Blood Culture adequate volume   Culture   Final    NO GROWTH 4 DAYS Performed at [facility], [address], [place], [place] [address]    Report Status PENDING  Incomplete  Resp panel by RT-PCR (RSV, Flu A&B, Covid) Anterior Nasal Swab     Status: None   Collection Time: 07/10/23  6:20 PM   Specimen: Anterior Nasal Swab  Result Value Ref Range Status   SARS Coronavirus 2 by RT PCR NEGATIVE NEGATIVE Final    Comment: (NOTE) SARS-CoV-2 target nucleic acids are NOT DETECTED.  The SARS-CoV-2 RNA is generally detectable in upper respiratory specimens during the acute phase of infection. The lowest concentration of SARS-CoV-2 viral copies this assay can detect is 138 copies/mL. A negative result does not preclude SARS-Cov-2 infection and should not be used as the sole basis for treatment or other patient management decisions. A negative result may occur with  improper specimen collection/handling, submission of specimen other than nasopharyngeal swab, presence of viral mutation(s) within the areas targeted by this assay, and inadequate number of viral copies(<138 copies/mL). A negative result must be combined with clinical observations, patient history, and epidemiological information. The expected result is Negative.  Fact Sheet for Patients:  BloggerCourse.com  Fact Sheet for Healthcare Providers:  SeriousBroker.it  This test is no t yet approved or cleared by the [place]  FDA and  has been authorized for  detection and/or diagnosis of SARS-CoV-2 by FDA under an Emergency Use Authorization (EUA). This EUA will remain  in effect (meaning this test can be used) for the duration of the COVID-19 declaration under Section 564(b)(1) of the Act, 21 U.S.C.section 360bbb-3(b)(1), unless the authorization is terminated  or revoked sooner.       Influenza A by PCR NEGATIVE NEGATIVE Final   Influenza B by PCR NEGATIVE NEGATIVE Final    Comment: (NOTE) The Xpert Xpress SARS-CoV-2/FLU/RSV plus assay is intended as an aid in the diagnosis of influenza from Nasopharyngeal swab specimens and should not be used as a sole basis for treatment. Nasal washings and aspirates are unacceptable for Xpert Xpress SARS-CoV-2/FLU/RSV testing.  Fact Sheet for Patients: BloggerCourse.com  Fact Sheet for Healthcare Providers: SeriousBroker.it  This test is not yet approved or cleared by the [place]  FDA and has been  authorized for detection and/or diagnosis of SARS-CoV-2 by FDA under an Emergency Use Authorization (EUA). This EUA will remain in effect (meaning this test can be used) for the duration of the COVID-19 declaration under Section 564(b)(1) of the Act, 21 U.S.C. section 360bbb-3(b)(1), unless the authorization is terminated or revoked.     Resp Syncytial Virus by PCR NEGATIVE NEGATIVE Final    Comment: (NOTE) Fact Sheet for Patients: BloggerCourse.com  Fact Sheet for Healthcare Providers: SeriousBroker.it  This test is not yet approved or cleared by the [place]  FDA and has been authorized for detection and/or diagnosis of SARS-CoV-2 by FDA under an Emergency Use Authorization (EUA). This EUA will remain in effect (meaning this test can be used) for the duration of the COVID-19 declaration under Section 564(b)(1) of the Act, 21 U.S.C. section 360bbb-3(b)(1), unless the authorization is  terminated or revoked.  Performed at [facility], [address], [place], [place] [address]   Urine Culture     Status: Abnormal   Collection Time: 07/10/23  6:25 PM   Specimen: Urine, Random  Result Value Ref Range Status   Specimen Description   Final    URINE, RANDOM Performed at [facility], [address] [place], [place] [address]    Special Requests   Final    NONE Reflexed from [id number] Performed at [facility], [address], [place], [place] [address]    Culture MULTIPLE SPECIES PRESENT, SUGGEST RECOLLECTION (A)  Final   Report Status 07/11/2023 FINAL  Final   Time coordinating discharge:  35 mins   SIGNED:  Afton Louder, MD  [facility] 07/15/2023, 9:47 AM How to contact the [facility] Attending or Consulting provider 7A - 7P or covering provider during after hours 7P -7A, for this patient?  Check the care team in [facility] and look for a) attending/consulting TR[facility] provider listed and b) the TRH team listed Log into [email or web address] and use [place]'s universal password to access. If you do not have the password, please contact the hospital operator. Locate the TR[facility] provider you are looking for under [facility] and page to a number that you can be directly reached. If you still have difficulty reaching the provider, please page the [facility] (Director on Call) for the Hospitalists listed on amion for assistance.

## 2023-07-15 NOTE — Discharge Instructions (Signed)
 IMPORTANT INFORMATION: PAY CLOSE ATTENTION   PHYSICIAN DISCHARGE INSTRUCTIONS  Follow with Primary care provider  Bevely Doffing, FNP  and other consultants as instructed by your Hospitalist Physician  SEEK MEDICAL CARE OR RETURN TO EMERGENCY ROOM IF SYMPTOMS COME BACK, WORSEN OR NEW PROBLEM DEVELOPS   Please note: You were cared for by a hospitalist during your hospital stay. Every effort will be made to forward records to your primary care provider.  You can request that your primary care provider send for your hospital records if they have not received them.  Once you are discharged, your primary care physician will handle any further medical issues. Please note that NO REFILLS for any discharge medications will be authorized once you are discharged, as it is imperative that you return to your primary care physician (or establish a relationship with a primary care physician if you do not have one) for your post hospital discharge needs so that they can reassess your need for medications and monitor your lab values.  Please get a complete blood count and chemistry panel checked by your Primary MD at your next visit, and again as instructed by your Primary MD.  Get Medicines reviewed and adjusted: Please take all your medications with you for your next visit with your Primary MD  Laboratory/radiological data: Please request your Primary MD to go over all hospital tests and procedure/radiological results at the follow up, please ask your primary care provider to get all Hospital records sent to his/her office.  In some cases, they will be blood work, cultures and biopsy results pending at the time of your discharge. Please request that your primary care provider follow up on these results.  If you are diabetic, please bring your blood sugar readings with you to your follow up appointment with primary care.    Please call and make your follow up appointments as soon as possible.    Also Note  the following: If you experience worsening of your admission symptoms, develop shortness of breath, life threatening emergency, suicidal or homicidal thoughts you must seek medical attention immediately by calling 911 or calling your MD immediately  if symptoms less severe.  You must read complete instructions/literature along with all the possible adverse reactions/side effects for all the Medicines you take and that have been prescribed to you. Take any new Medicines after you have completely understood and accpet all the possible adverse reactions/side effects.   Do not drive when taking Pain medications or sleeping medications (Benzodiazepines)  Do not take more than prescribed Pain, Sleep and Anxiety Medications. It is not advisable to combine anxiety,sleep and pain medications without talking with your primary care practitioner  Special Instructions: If you have smoked or chewed Tobacco  in the last 2 yrs please stop smoking, stop any regular Alcohol  and or any Recreational drug use.  Wear Seat belts while driving.  Do not drive if taking any narcotic, mind altering or controlled substances or recreational drugs or alcohol.

## 2023-07-15 NOTE — Progress Notes (Signed)
 Report given to [employer]

## 2023-07-15 NOTE — Progress Notes (Signed)
 Mobility Specialist Progress Note:    07/15/23 1030  Mobility  Activity Dangled on edge of bed  Level of Assistance Maximum assist, patient does 25-49%  Assistive Device None  Range of Motion/Exercises Active Assistive;All extremities  Activity Response Tolerated well  Mobility Referral Yes  Mobility visit 1 Mobility  Mobility Specialist Start Time (ACUTE ONLY) 1014  Mobility Specialist Stop Time (ACUTE ONLY) 1036  Mobility Specialist Time Calculation (min) (ACUTE ONLY) 22 min   Pt received in bed, son in room agreeable to mobility, required MaxA for bed mobility and to scoot to EOB. Tolerated well, pt able to perform BLE leg kicks and leg marches x5. Returned supine, alarm on. All needs met.   Sherrilee Ditty Mobility Specialist Please contact via Special educational needs teacher or  Rehab office at [phone]

## 2023-07-15 NOTE — TOC Transition Note (Signed)
 Transition of Care [facility]) - Discharge Note  Patient Details  Name: Tristan Lopez MRN: [id number] Date of Birth: [birth year]  [facility] CM/SW Contact:  Nena LITTIE Coffee, RN Phone Number: 07/15/2023, 10:50 AM  Clinical Narrative:    PT to dc to [facility], [place], [place]. Room 88. Nursing report will be called to [phone]. DC Summary, instructions, prescriptions if needed, faxed to [facility] at [phone]. [facility] has been notified of transport. TOC signing off.   Final next level of care: Skilled Nursing Facility Barriers to Discharge: Barriers Resolved  Patient Goals and CMS Choice Patient states their goals for this hospitalization and ongoing recovery are:: Get better CMS Medicare.gov Compare Post Acute Care list provided to:: Patient Represenative (must comment) (Daughter- Dorthea) Choice offered to / list presented to : Adult Children    Discharge Placement   Discharge Plan and Services Additional resources added to the After Visit Summary for   In-house Referral: Clinical Social Work   Post Acute Care Choice: Durable Medical Equipment                               Social Drivers of Health (SDOH) Interventions SDOH Screenings   Food Insecurity: Patient Unable To Answer (07/11/2023)  Housing: Patient Unable To Answer (07/11/2023)  Transportation Needs: Patient Unable To Answer (07/11/2023)  Utilities: Not At Risk (02/12/2023)   Received from [facility]  Alcohol Screen: Low Risk  (03/27/2022)  Depression (PHQ2-9): High Risk (05/31/2023)  Financial Resource Strain: Low Risk  (04/10/2023)  Physical Activity: Inactive (04/10/2023)  Social Connections: Unknown (07/11/2023)  Stress: No Stress Concern Present (04/10/2023)  Tobacco Use: Medium Risk (07/10/2023)     Readmission Risk Interventions    07/11/2023   11:51 AM  Readmission Risk Prevention Plan  Transportation Screening Complete  Home Care Screening  Complete  Medication Review (RN CM) Complete

## 2023-07-16 ENCOUNTER — Other Ambulatory Visit: Payer: Self-pay | Admitting: Internal Medicine

## 2023-07-16 DIAGNOSIS — F0390 Unspecified dementia without behavioral disturbance: Secondary | ICD-10-CM | POA: Diagnosis not present

## 2023-07-16 DIAGNOSIS — G4733 Obstructive sleep apnea (adult) (pediatric): Secondary | ICD-10-CM | POA: Diagnosis not present

## 2023-07-16 DIAGNOSIS — I4891 Unspecified atrial fibrillation: Secondary | ICD-10-CM | POA: Diagnosis not present

## 2023-07-16 DIAGNOSIS — I10 Essential (primary) hypertension: Secondary | ICD-10-CM | POA: Diagnosis not present

## 2023-07-16 DIAGNOSIS — N401 Enlarged prostate with lower urinary tract symptoms: Secondary | ICD-10-CM | POA: Diagnosis not present

## 2023-07-16 DIAGNOSIS — D508 Other iron deficiency anemias: Secondary | ICD-10-CM

## 2023-07-16 DIAGNOSIS — R5381 Other malaise: Secondary | ICD-10-CM | POA: Diagnosis not present

## 2023-07-17 ENCOUNTER — Telehealth: Payer: Self-pay | Admitting: Cardiology

## 2023-07-17 NOTE — Telephone Encounter (Signed)
 Pt c/o medication issue:  1. Name of Medication:   warfarin (COUMADIN ) 5 MG tablet   2. How are you currently taking this medication (dosage and times per day)?   3. Are you having a reaction (difficulty breathing--STAT)?   4. What is your medication issue?    Caller [facility]) stated patient is now a long term resident at their facility and Dr. Earline has taken patient off warfarin (COUMADIN ) 5 MG tablet and put the patient on Eliquis.  Caller wants a call back to confirm medication change.

## 2023-07-17 NOTE — Telephone Encounter (Signed)
 Returned call and spoke with Carlyon Goes RN.  Confirmed with her that Long term Care Facility MD had taken pt off of warfarin and started him on Eliquis 2.5mg  twice daily.  Med chart reconciled.

## 2023-07-22 ENCOUNTER — Ambulatory Visit: Payer: Self-pay | Admitting: *Deleted

## 2023-07-25 DIAGNOSIS — R5381 Other malaise: Secondary | ICD-10-CM | POA: Diagnosis not present

## 2023-07-25 DIAGNOSIS — N401 Enlarged prostate with lower urinary tract symptoms: Secondary | ICD-10-CM | POA: Diagnosis not present

## 2023-07-25 DIAGNOSIS — F0390 Unspecified dementia without behavioral disturbance: Secondary | ICD-10-CM | POA: Diagnosis not present

## 2023-07-25 DIAGNOSIS — E785 Hyperlipidemia, unspecified: Secondary | ICD-10-CM | POA: Diagnosis not present

## 2023-07-25 DIAGNOSIS — I4891 Unspecified atrial fibrillation: Secondary | ICD-10-CM | POA: Diagnosis not present

## 2023-07-25 DIAGNOSIS — F341 Dysthymic disorder: Secondary | ICD-10-CM | POA: Diagnosis not present

## 2023-07-25 DIAGNOSIS — I10 Essential (primary) hypertension: Secondary | ICD-10-CM | POA: Diagnosis not present

## 2023-07-30 ENCOUNTER — Encounter: Payer: Self-pay | Admitting: Internal Medicine

## 2023-08-01 ENCOUNTER — Ambulatory Visit: Admitting: Cardiology

## 2023-08-07 DIAGNOSIS — F330 Major depressive disorder, recurrent, mild: Secondary | ICD-10-CM | POA: Diagnosis not present

## 2023-08-07 DIAGNOSIS — F02C11 Dementia in other diseases classified elsewhere, severe, with agitation: Secondary | ICD-10-CM | POA: Diagnosis not present

## 2023-08-12 ENCOUNTER — Ambulatory Visit

## 2023-08-12 ENCOUNTER — Telehealth: Payer: Self-pay | Admitting: Internal Medicine

## 2023-08-12 NOTE — Telephone Encounter (Signed)
 Dr Shaaron patient is in nursing home now has another doctor.Patients daughter got recall letter and was just informing us .

## 2023-08-14 DIAGNOSIS — F330 Major depressive disorder, recurrent, mild: Secondary | ICD-10-CM | POA: Diagnosis not present

## 2023-08-14 DIAGNOSIS — F02C11 Dementia in other diseases classified elsewhere, severe, with agitation: Secondary | ICD-10-CM | POA: Diagnosis not present

## 2023-08-19 DIAGNOSIS — F330 Major depressive disorder, recurrent, mild: Secondary | ICD-10-CM | POA: Diagnosis not present

## 2023-09-03 DIAGNOSIS — F330 Major depressive disorder, recurrent, mild: Secondary | ICD-10-CM | POA: Diagnosis not present

## 2023-09-03 DIAGNOSIS — F02C11 Dementia in other diseases classified elsewhere, severe, with agitation: Secondary | ICD-10-CM | POA: Diagnosis not present

## 2023-09-06 DIAGNOSIS — N401 Enlarged prostate with lower urinary tract symptoms: Secondary | ICD-10-CM | POA: Diagnosis not present

## 2023-09-06 DIAGNOSIS — R319 Hematuria, unspecified: Secondary | ICD-10-CM | POA: Diagnosis not present

## 2023-09-06 DIAGNOSIS — I1310 Hypertensive heart and chronic kidney disease without heart failure, with stage 1 through stage 4 chronic kidney disease, or unspecified chronic kidney disease: Secondary | ICD-10-CM | POA: Diagnosis not present

## 2023-09-06 DIAGNOSIS — E785 Hyperlipidemia, unspecified: Secondary | ICD-10-CM | POA: Diagnosis not present

## 2023-09-06 DIAGNOSIS — R310 Gross hematuria: Secondary | ICD-10-CM | POA: Diagnosis not present

## 2023-09-06 DIAGNOSIS — N390 Urinary tract infection, site not specified: Secondary | ICD-10-CM | POA: Diagnosis not present

## 2023-09-06 DIAGNOSIS — I482 Chronic atrial fibrillation, unspecified: Secondary | ICD-10-CM | POA: Diagnosis not present

## 2023-09-06 DIAGNOSIS — K7689 Other specified diseases of liver: Secondary | ICD-10-CM | POA: Diagnosis not present

## 2023-09-06 DIAGNOSIS — Z66 Do not resuscitate: Secondary | ICD-10-CM | POA: Diagnosis not present

## 2023-09-06 DIAGNOSIS — I7143 Infrarenal abdominal aortic aneurysm, without rupture: Secondary | ICD-10-CM | POA: Diagnosis not present

## 2023-09-06 DIAGNOSIS — N312 Flaccid neuropathic bladder, not elsewhere classified: Secondary | ICD-10-CM | POA: Diagnosis not present

## 2023-09-06 DIAGNOSIS — R079 Chest pain, unspecified: Secondary | ICD-10-CM | POA: Diagnosis not present

## 2023-09-06 DIAGNOSIS — N200 Calculus of kidney: Secondary | ICD-10-CM | POA: Diagnosis not present

## 2023-09-06 DIAGNOSIS — R58 Hemorrhage, not elsewhere classified: Secondary | ICD-10-CM | POA: Diagnosis not present

## 2023-09-06 DIAGNOSIS — N281 Cyst of kidney, acquired: Secondary | ICD-10-CM | POA: Diagnosis not present

## 2023-09-06 DIAGNOSIS — D68318 Other hemorrhagic disorder due to intrinsic circulating anticoagulants, antibodies, or inhibitors: Secondary | ICD-10-CM | POA: Diagnosis not present

## 2023-09-06 DIAGNOSIS — N182 Chronic kidney disease, stage 2 (mild): Secondary | ICD-10-CM | POA: Diagnosis not present

## 2023-09-07 DIAGNOSIS — N390 Urinary tract infection, site not specified: Secondary | ICD-10-CM | POA: Diagnosis not present

## 2023-09-07 DIAGNOSIS — D68318 Other hemorrhagic disorder due to intrinsic circulating anticoagulants, antibodies, or inhibitors: Secondary | ICD-10-CM | POA: Diagnosis not present

## 2023-09-07 DIAGNOSIS — R310 Gross hematuria: Secondary | ICD-10-CM | POA: Diagnosis not present

## 2023-09-07 DIAGNOSIS — I482 Chronic atrial fibrillation, unspecified: Secondary | ICD-10-CM | POA: Diagnosis not present

## 2023-09-08 DIAGNOSIS — I482 Chronic atrial fibrillation, unspecified: Secondary | ICD-10-CM | POA: Diagnosis not present

## 2023-09-08 DIAGNOSIS — N390 Urinary tract infection, site not specified: Secondary | ICD-10-CM | POA: Diagnosis not present

## 2023-09-08 DIAGNOSIS — D68318 Other hemorrhagic disorder due to intrinsic circulating anticoagulants, antibodies, or inhibitors: Secondary | ICD-10-CM | POA: Diagnosis not present

## 2023-09-08 DIAGNOSIS — R310 Gross hematuria: Secondary | ICD-10-CM | POA: Diagnosis not present

## 2023-09-09 DIAGNOSIS — R310 Gross hematuria: Secondary | ICD-10-CM | POA: Diagnosis not present

## 2023-09-09 DIAGNOSIS — D68318 Other hemorrhagic disorder due to intrinsic circulating anticoagulants, antibodies, or inhibitors: Secondary | ICD-10-CM | POA: Diagnosis not present

## 2023-09-09 DIAGNOSIS — I482 Chronic atrial fibrillation, unspecified: Secondary | ICD-10-CM | POA: Diagnosis not present

## 2023-09-09 DIAGNOSIS — R531 Weakness: Secondary | ICD-10-CM | POA: Diagnosis not present

## 2023-09-09 DIAGNOSIS — N390 Urinary tract infection, site not specified: Secondary | ICD-10-CM | POA: Diagnosis not present

## 2023-09-09 DIAGNOSIS — Z7401 Bed confinement status: Secondary | ICD-10-CM | POA: Diagnosis not present

## 2023-09-10 DIAGNOSIS — F0390 Unspecified dementia without behavioral disturbance: Secondary | ICD-10-CM | POA: Diagnosis not present

## 2023-09-10 DIAGNOSIS — N401 Enlarged prostate with lower urinary tract symptoms: Secondary | ICD-10-CM | POA: Diagnosis not present

## 2023-09-10 DIAGNOSIS — R319 Hematuria, unspecified: Secondary | ICD-10-CM | POA: Diagnosis not present

## 2023-09-10 DIAGNOSIS — I4891 Unspecified atrial fibrillation: Secondary | ICD-10-CM | POA: Diagnosis not present

## 2023-09-11 DIAGNOSIS — K5730 Diverticulosis of large intestine without perforation or abscess without bleeding: Secondary | ICD-10-CM | POA: Diagnosis not present

## 2023-09-11 DIAGNOSIS — R109 Unspecified abdominal pain: Secondary | ICD-10-CM | POA: Diagnosis not present

## 2023-09-11 DIAGNOSIS — R1084 Generalized abdominal pain: Secondary | ICD-10-CM | POA: Diagnosis not present

## 2023-09-11 DIAGNOSIS — Z91040 Latex allergy status: Secondary | ICD-10-CM | POA: Diagnosis not present

## 2023-09-11 DIAGNOSIS — N200 Calculus of kidney: Secondary | ICD-10-CM | POA: Diagnosis not present

## 2023-09-11 DIAGNOSIS — N390 Urinary tract infection, site not specified: Secondary | ICD-10-CM | POA: Diagnosis not present

## 2023-09-11 DIAGNOSIS — I119 Hypertensive heart disease without heart failure: Secondary | ICD-10-CM | POA: Diagnosis not present

## 2023-09-12 DIAGNOSIS — Z7401 Bed confinement status: Secondary | ICD-10-CM | POA: Diagnosis not present

## 2023-09-12 DIAGNOSIS — R531 Weakness: Secondary | ICD-10-CM | POA: Diagnosis not present

## 2023-09-12 DIAGNOSIS — F330 Major depressive disorder, recurrent, mild: Secondary | ICD-10-CM | POA: Diagnosis not present

## 2023-09-12 DIAGNOSIS — F02C11 Dementia in other diseases classified elsewhere, severe, with agitation: Secondary | ICD-10-CM | POA: Diagnosis not present

## 2023-09-16 DIAGNOSIS — F02C11 Dementia in other diseases classified elsewhere, severe, with agitation: Secondary | ICD-10-CM | POA: Diagnosis not present

## 2023-09-16 DIAGNOSIS — F330 Major depressive disorder, recurrent, mild: Secondary | ICD-10-CM | POA: Diagnosis not present

## 2023-09-17 DIAGNOSIS — R5381 Other malaise: Secondary | ICD-10-CM | POA: Diagnosis not present

## 2023-09-17 DIAGNOSIS — E785 Hyperlipidemia, unspecified: Secondary | ICD-10-CM | POA: Diagnosis not present

## 2023-09-17 DIAGNOSIS — F0390 Unspecified dementia without behavioral disturbance: Secondary | ICD-10-CM | POA: Diagnosis not present

## 2023-09-17 DIAGNOSIS — I10 Essential (primary) hypertension: Secondary | ICD-10-CM | POA: Diagnosis not present

## 2023-09-17 DIAGNOSIS — F341 Dysthymic disorder: Secondary | ICD-10-CM | POA: Diagnosis not present

## 2023-09-17 DIAGNOSIS — N401 Enlarged prostate with lower urinary tract symptoms: Secondary | ICD-10-CM | POA: Diagnosis not present

## 2023-09-17 DIAGNOSIS — I4891 Unspecified atrial fibrillation: Secondary | ICD-10-CM | POA: Diagnosis not present

## 2023-09-18 DIAGNOSIS — F02C11 Dementia in other diseases classified elsewhere, severe, with agitation: Secondary | ICD-10-CM | POA: Diagnosis not present

## 2023-09-18 DIAGNOSIS — F330 Major depressive disorder, recurrent, mild: Secondary | ICD-10-CM | POA: Diagnosis not present

## 2023-09-21 DIAGNOSIS — N281 Cyst of kidney, acquired: Secondary | ICD-10-CM | POA: Diagnosis not present

## 2023-09-21 DIAGNOSIS — B952 Enterococcus as the cause of diseases classified elsewhere: Secondary | ICD-10-CM | POA: Diagnosis not present

## 2023-09-21 DIAGNOSIS — N390 Urinary tract infection, site not specified: Secondary | ICD-10-CM | POA: Diagnosis not present

## 2023-09-21 DIAGNOSIS — S0990XA Unspecified injury of head, initial encounter: Secondary | ICD-10-CM | POA: Diagnosis not present

## 2023-09-21 DIAGNOSIS — B965 Pseudomonas (aeruginosa) (mallei) (pseudomallei) as the cause of diseases classified elsewhere: Secondary | ICD-10-CM | POA: Diagnosis not present

## 2023-09-21 DIAGNOSIS — R310 Gross hematuria: Secondary | ICD-10-CM | POA: Diagnosis not present

## 2023-09-21 DIAGNOSIS — W1830XA Fall on same level, unspecified, initial encounter: Secondary | ICD-10-CM | POA: Diagnosis not present

## 2023-09-21 DIAGNOSIS — Z66 Do not resuscitate: Secondary | ICD-10-CM | POA: Diagnosis not present

## 2023-09-21 DIAGNOSIS — Z7401 Bed confinement status: Secondary | ICD-10-CM | POA: Diagnosis not present

## 2023-09-21 DIAGNOSIS — S72045A Nondisplaced fracture of base of neck of left femur, initial encounter for closed fracture: Secondary | ICD-10-CM | POA: Diagnosis not present

## 2023-09-21 DIAGNOSIS — R2232 Localized swelling, mass and lump, left upper limb: Secondary | ICD-10-CM | POA: Diagnosis not present

## 2023-09-21 DIAGNOSIS — S72002A Fracture of unspecified part of neck of left femur, initial encounter for closed fracture: Secondary | ICD-10-CM | POA: Diagnosis not present

## 2023-09-21 DIAGNOSIS — R58 Hemorrhage, not elsewhere classified: Secondary | ICD-10-CM | POA: Diagnosis not present

## 2023-09-21 DIAGNOSIS — S299XXA Unspecified injury of thorax, initial encounter: Secondary | ICD-10-CM | POA: Diagnosis not present

## 2023-09-21 DIAGNOSIS — T83098A Other mechanical complication of other indwelling urethral catheter, initial encounter: Secondary | ICD-10-CM | POA: Diagnosis not present

## 2023-09-21 DIAGNOSIS — R000 Tachycardia, unspecified: Secondary | ICD-10-CM | POA: Diagnosis not present

## 2023-09-21 DIAGNOSIS — S199XXA Unspecified injury of neck, initial encounter: Secondary | ICD-10-CM | POA: Diagnosis not present

## 2023-09-21 DIAGNOSIS — Z515 Encounter for palliative care: Secondary | ICD-10-CM | POA: Diagnosis not present

## 2023-09-21 DIAGNOSIS — I10 Essential (primary) hypertension: Secondary | ICD-10-CM | POA: Diagnosis not present

## 2023-09-21 DIAGNOSIS — I482 Chronic atrial fibrillation, unspecified: Secondary | ICD-10-CM | POA: Diagnosis not present

## 2023-09-22 DIAGNOSIS — I10 Essential (primary) hypertension: Secondary | ICD-10-CM | POA: Diagnosis not present

## 2023-09-22 DIAGNOSIS — S72002A Fracture of unspecified part of neck of left femur, initial encounter for closed fracture: Secondary | ICD-10-CM | POA: Diagnosis not present

## 2023-09-24 DIAGNOSIS — Z7401 Bed confinement status: Secondary | ICD-10-CM | POA: Diagnosis not present

## 2023-09-24 DIAGNOSIS — R000 Tachycardia, unspecified: Secondary | ICD-10-CM | POA: Diagnosis not present

## 2023-09-25 DIAGNOSIS — R319 Hematuria, unspecified: Secondary | ICD-10-CM | POA: Diagnosis not present

## 2023-09-25 DIAGNOSIS — Z515 Encounter for palliative care: Secondary | ICD-10-CM | POA: Diagnosis not present

## 2023-09-25 DIAGNOSIS — F0390 Unspecified dementia without behavioral disturbance: Secondary | ICD-10-CM | POA: Diagnosis not present

## 2023-09-25 DIAGNOSIS — I10 Essential (primary) hypertension: Secondary | ICD-10-CM | POA: Diagnosis not present

## 2023-09-25 DIAGNOSIS — F341 Dysthymic disorder: Secondary | ICD-10-CM | POA: Diagnosis not present

## 2023-09-25 DIAGNOSIS — N401 Enlarged prostate with lower urinary tract symptoms: Secondary | ICD-10-CM | POA: Diagnosis not present

## 2023-09-25 DIAGNOSIS — I4891 Unspecified atrial fibrillation: Secondary | ICD-10-CM | POA: Diagnosis not present

## 2023-09-25 DIAGNOSIS — E785 Hyperlipidemia, unspecified: Secondary | ICD-10-CM | POA: Diagnosis not present

## 2023-09-30 DIAGNOSIS — F330 Major depressive disorder, recurrent, mild: Secondary | ICD-10-CM | POA: Diagnosis not present

## 2023-10-07 ENCOUNTER — Ambulatory Visit

## 2023-10-09 DIAGNOSIS — F02C11 Dementia in other diseases classified elsewhere, severe, with agitation: Secondary | ICD-10-CM | POA: Diagnosis not present

## 2023-10-09 DIAGNOSIS — F330 Major depressive disorder, recurrent, mild: Secondary | ICD-10-CM | POA: Diagnosis not present

## 2023-10-14 DIAGNOSIS — F330 Major depressive disorder, recurrent, mild: Secondary | ICD-10-CM | POA: Diagnosis not present

## 2023-10-16 DIAGNOSIS — F02C11 Dementia in other diseases classified elsewhere, severe, with agitation: Secondary | ICD-10-CM | POA: Diagnosis not present

## 2023-10-16 DIAGNOSIS — F330 Major depressive disorder, recurrent, mild: Secondary | ICD-10-CM | POA: Diagnosis not present

## 2023-10-30 DIAGNOSIS — Z91040 Latex allergy status: Secondary | ICD-10-CM | POA: Diagnosis not present

## 2023-10-30 DIAGNOSIS — Z743 Need for continuous supervision: Secondary | ICD-10-CM | POA: Diagnosis not present

## 2023-10-30 DIAGNOSIS — Z466 Encounter for fitting and adjustment of urinary device: Secondary | ICD-10-CM | POA: Diagnosis not present

## 2023-10-30 DIAGNOSIS — R2689 Other abnormalities of gait and mobility: Secondary | ICD-10-CM | POA: Diagnosis not present

## 2023-10-30 DIAGNOSIS — T83198A Other mechanical complication of other urinary devices and implants, initial encounter: Secondary | ICD-10-CM | POA: Diagnosis not present

## 2023-10-30 DIAGNOSIS — R339 Retention of urine, unspecified: Secondary | ICD-10-CM | POA: Diagnosis not present

## 2023-10-30 DIAGNOSIS — R531 Weakness: Secondary | ICD-10-CM | POA: Diagnosis not present

## 2023-11-06 DIAGNOSIS — F02C11 Dementia in other diseases classified elsewhere, severe, with agitation: Secondary | ICD-10-CM | POA: Diagnosis not present

## 2023-11-06 DIAGNOSIS — F330 Major depressive disorder, recurrent, mild: Secondary | ICD-10-CM | POA: Diagnosis not present

## 2024-05-13 ENCOUNTER — Ambulatory Visit: Admitting: Urology
